# Patient Record
Sex: Female | Born: 1965 | Race: White | Hispanic: No | Smoking: Current every day smoker
Health system: Southern US, Community
[De-identification: ages and names within clinical notes are randomized; demographics above are authoritative.]

## PROBLEM LIST (undated history)

## (undated) VITALS — BP 120/79 | HR 95 | Temp 98.0°F

## (undated) VITALS — BP 126/83 | HR 83 | Temp 98.6°F

## (undated) DIAGNOSIS — F32A Depression, unspecified: Secondary | ICD-10-CM

## (undated) DIAGNOSIS — G8929 Other chronic pain: Secondary | ICD-10-CM

## (undated) DIAGNOSIS — R002 Palpitations: Secondary | ICD-10-CM

## (undated) DIAGNOSIS — F329 Major depressive disorder, single episode, unspecified: Secondary | ICD-10-CM

## (undated) DIAGNOSIS — C801 Malignant (primary) neoplasm, unspecified: Secondary | ICD-10-CM

## (undated) DIAGNOSIS — J45909 Unspecified asthma, uncomplicated: Secondary | ICD-10-CM

## (undated) DIAGNOSIS — C4490 Unspecified malignant neoplasm of skin, unspecified: Secondary | ICD-10-CM

## (undated) DIAGNOSIS — R5382 Chronic fatigue, unspecified: Secondary | ICD-10-CM

## (undated) DIAGNOSIS — W3400XA Accidental discharge from unspecified firearms or gun, initial encounter: Secondary | ICD-10-CM

## (undated) DIAGNOSIS — B379 Candidiasis, unspecified: Secondary | ICD-10-CM

## (undated) DIAGNOSIS — F1910 Other psychoactive substance abuse, uncomplicated: Secondary | ICD-10-CM

## (undated) DIAGNOSIS — F419 Anxiety disorder, unspecified: Secondary | ICD-10-CM

## (undated) DIAGNOSIS — M549 Dorsalgia, unspecified: Secondary | ICD-10-CM

## (undated) DIAGNOSIS — F22 Delusional disorders: Secondary | ICD-10-CM

## (undated) DIAGNOSIS — G9332 Myalgic encephalomyelitis/chronic fatigue syndrome: Secondary | ICD-10-CM

## (undated) DIAGNOSIS — E039 Hypothyroidism, unspecified: Secondary | ICD-10-CM

## (undated) DIAGNOSIS — J189 Pneumonia, unspecified organism: Secondary | ICD-10-CM

## (undated) DIAGNOSIS — F319 Bipolar disorder, unspecified: Secondary | ICD-10-CM

## (undated) DIAGNOSIS — F259 Schizoaffective disorder, unspecified: Secondary | ICD-10-CM

## (undated) DIAGNOSIS — M1990 Unspecified osteoarthritis, unspecified site: Secondary | ICD-10-CM

## (undated) DIAGNOSIS — K838 Other specified diseases of biliary tract: Secondary | ICD-10-CM

## (undated) DIAGNOSIS — M797 Fibromyalgia: Secondary | ICD-10-CM

## (undated) DIAGNOSIS — J449 Chronic obstructive pulmonary disease, unspecified: Secondary | ICD-10-CM

## (undated) DIAGNOSIS — Z87442 Personal history of urinary calculi: Secondary | ICD-10-CM

## (undated) DIAGNOSIS — N898 Other specified noninflammatory disorders of vagina: Principal | ICD-10-CM

## (undated) DIAGNOSIS — K219 Gastro-esophageal reflux disease without esophagitis: Secondary | ICD-10-CM

## (undated) HISTORY — DX: Other psychoactive substance abuse, uncomplicated: F19.10

## (undated) HISTORY — DX: Accidental discharge from unspecified firearms or gun, initial encounter: W34.00XA

## (undated) HISTORY — DX: Dorsalgia, unspecified: M54.9

## (undated) HISTORY — DX: Anxiety disorder, unspecified: F41.9

## (undated) HISTORY — DX: Fibromyalgia: M79.7

## (undated) HISTORY — DX: Major depressive disorder, single episode, unspecified: F32.9

## (undated) HISTORY — DX: Palpitations: R00.2

## (undated) HISTORY — DX: Unspecified osteoarthritis, unspecified site: M19.90

## (undated) HISTORY — DX: Unspecified malignant neoplasm of skin, unspecified: C44.90

## (undated) HISTORY — DX: Other chronic pain: G89.29

## (undated) HISTORY — DX: Gastro-esophageal reflux disease without esophagitis: K21.9

## (undated) HISTORY — DX: Candidiasis, unspecified: B37.9

## (undated) HISTORY — DX: Depression, unspecified: F32.A

## (undated) HISTORY — DX: Chronic fatigue, unspecified: R53.82

## (undated) HISTORY — DX: Other specified noninflammatory disorders of vagina: N89.8

## (undated) HISTORY — DX: Myalgic encephalomyelitis/chronic fatigue syndrome: G93.32

---

## 2000-04-04 DIAGNOSIS — F1910 Other psychoactive substance abuse, uncomplicated: Secondary | ICD-10-CM

## 2000-04-04 HISTORY — DX: Other psychoactive substance abuse, uncomplicated: F19.10

## 2000-11-05 ENCOUNTER — Emergency Department (HOSPITAL_COMMUNITY): Admission: EM | Admit: 2000-11-05 | Discharge: 2000-11-05 | Payer: Self-pay | Admitting: *Deleted

## 2003-11-29 ENCOUNTER — Emergency Department (HOSPITAL_COMMUNITY): Admission: EM | Admit: 2003-11-29 | Discharge: 2003-11-30 | Payer: Self-pay | Admitting: Emergency Medicine

## 2004-03-19 ENCOUNTER — Emergency Department (HOSPITAL_COMMUNITY): Admission: EM | Admit: 2004-03-19 | Discharge: 2004-03-19 | Payer: Self-pay | Admitting: Emergency Medicine

## 2004-06-11 ENCOUNTER — Emergency Department (HOSPITAL_COMMUNITY): Admission: EM | Admit: 2004-06-11 | Discharge: 2004-06-12 | Payer: Self-pay | Admitting: *Deleted

## 2006-03-14 ENCOUNTER — Emergency Department (HOSPITAL_COMMUNITY): Admission: EM | Admit: 2006-03-14 | Discharge: 2006-03-14 | Payer: Self-pay | Admitting: Emergency Medicine

## 2006-03-15 ENCOUNTER — Ambulatory Visit (HOSPITAL_COMMUNITY): Admission: RE | Admit: 2006-03-15 | Discharge: 2006-03-15 | Payer: Self-pay | Admitting: Emergency Medicine

## 2006-04-04 HISTORY — PX: ABDOMINAL HYSTERECTOMY: SHX81

## 2006-05-15 ENCOUNTER — Emergency Department (HOSPITAL_COMMUNITY): Admission: EM | Admit: 2006-05-15 | Discharge: 2006-05-15 | Payer: Self-pay | Admitting: Emergency Medicine

## 2006-05-18 ENCOUNTER — Emergency Department (HOSPITAL_COMMUNITY): Admission: EM | Admit: 2006-05-18 | Discharge: 2006-05-18 | Payer: Self-pay | Admitting: Emergency Medicine

## 2006-06-19 ENCOUNTER — Emergency Department (HOSPITAL_COMMUNITY): Admission: EM | Admit: 2006-06-19 | Discharge: 2006-06-20 | Payer: Self-pay | Admitting: Emergency Medicine

## 2006-06-20 ENCOUNTER — Ambulatory Visit: Payer: Self-pay | Admitting: Psychiatry

## 2006-06-20 ENCOUNTER — Emergency Department (HOSPITAL_COMMUNITY): Admission: EM | Admit: 2006-06-20 | Discharge: 2006-06-20 | Payer: Self-pay | Admitting: Emergency Medicine

## 2006-06-20 ENCOUNTER — Inpatient Hospital Stay (HOSPITAL_COMMUNITY): Admission: RE | Admit: 2006-06-20 | Discharge: 2006-06-26 | Payer: Self-pay | Admitting: Psychiatry

## 2006-07-22 ENCOUNTER — Inpatient Hospital Stay (HOSPITAL_COMMUNITY): Admission: EM | Admit: 2006-07-22 | Discharge: 2006-07-24 | Payer: Self-pay | Admitting: Psychiatry

## 2006-07-22 ENCOUNTER — Emergency Department (HOSPITAL_COMMUNITY): Admission: EM | Admit: 2006-07-22 | Discharge: 2006-07-22 | Payer: Self-pay | Admitting: Emergency Medicine

## 2006-07-24 ENCOUNTER — Ambulatory Visit: Payer: Self-pay | Admitting: Psychiatry

## 2006-09-07 ENCOUNTER — Emergency Department (HOSPITAL_COMMUNITY): Admission: EM | Admit: 2006-09-07 | Discharge: 2006-09-08 | Payer: Self-pay | Admitting: *Deleted

## 2007-04-20 ENCOUNTER — Emergency Department (HOSPITAL_COMMUNITY): Admission: EM | Admit: 2007-04-20 | Discharge: 2007-04-21 | Payer: Self-pay | Admitting: Emergency Medicine

## 2007-04-27 ENCOUNTER — Emergency Department (HOSPITAL_COMMUNITY): Admission: EM | Admit: 2007-04-27 | Discharge: 2007-04-28 | Payer: Self-pay | Admitting: Emergency Medicine

## 2007-05-23 ENCOUNTER — Emergency Department (HOSPITAL_COMMUNITY): Admission: EM | Admit: 2007-05-23 | Discharge: 2007-05-23 | Payer: Self-pay | Admitting: Emergency Medicine

## 2009-03-23 ENCOUNTER — Emergency Department (HOSPITAL_COMMUNITY): Admission: EM | Admit: 2009-03-23 | Discharge: 2009-03-23 | Payer: Self-pay | Admitting: Emergency Medicine

## 2009-06-24 ENCOUNTER — Emergency Department (HOSPITAL_COMMUNITY): Admission: EM | Admit: 2009-06-24 | Discharge: 2009-06-24 | Payer: Self-pay | Admitting: Emergency Medicine

## 2009-09-23 ENCOUNTER — Emergency Department (HOSPITAL_COMMUNITY): Admission: EM | Admit: 2009-09-23 | Discharge: 2009-09-23 | Payer: Self-pay | Admitting: Emergency Medicine

## 2010-02-04 ENCOUNTER — Emergency Department (HOSPITAL_COMMUNITY): Admission: EM | Admit: 2010-02-04 | Discharge: 2010-02-04 | Payer: Self-pay | Admitting: Emergency Medicine

## 2010-04-16 ENCOUNTER — Ambulatory Visit: Admit: 2010-04-16 | Payer: Self-pay | Admitting: Cardiology

## 2010-04-16 ENCOUNTER — Encounter (INDEPENDENT_AMBULATORY_CARE_PROVIDER_SITE_OTHER): Payer: Self-pay | Admitting: *Deleted

## 2010-04-25 ENCOUNTER — Encounter: Payer: Self-pay | Admitting: Emergency Medicine

## 2010-05-06 NOTE — Letter (Signed)
Summary: Appointment - Missed  [facility] [place]  [address], [place] [address]   Phone: [phone]  Fax: [phone]     April 16, 2010 MRN: [id number]   [facility] [address] [place], [place]  [address]   Dear Ms. Liddy,  Our records indicate you missed your appointment on        04/16/10                with Dr.       .        MCDOWELL                               It is very important that we reach you to reschedule this appointment. We look forward to participating in your health care needs. Please contact us at the number listed above at your earliest convenience to reschedule this appointment.     Sincerely,    Glass blower/designer

## 2010-06-09 ENCOUNTER — Emergency Department (HOSPITAL_COMMUNITY)
Admission: EM | Admit: 2010-06-09 | Discharge: 2010-06-09 | Disposition: A | Discharge status: Home / Self Care | Payer: Medicaid Other | Attending: Emergency Medicine | Admitting: Emergency Medicine

## 2010-06-09 DIAGNOSIS — J020 Streptococcal pharyngitis: Secondary | ICD-10-CM | POA: Insufficient documentation

## 2010-06-09 LAB — RAPID STREP SCREEN (MED CTR MEBANE ONLY): Streptococcus, Group A Screen (Direct): POSITIVE — AB

## 2010-06-15 LAB — CBC
MCH: 32.8 pg (ref 26.0–34.0)
MCV: 98.4 fL (ref 78.0–100.0)
Platelets: 268 10*3/uL (ref 150–400)
RBC: 4.57 MIL/uL (ref 3.87–5.11)
RDW: 14.5 % (ref 11.5–15.5)

## 2010-06-15 LAB — DIFFERENTIAL
Basophils Absolute: 0 10*3/uL (ref 0.0–0.1)
Basophils Relative: 0 % (ref 0–1)
Eosinophils Absolute: 0.1 10*3/uL (ref 0.0–0.7)
Eosinophils Relative: 2 % (ref 0–5)
Lymphs Abs: 3 10*3/uL (ref 0.7–4.0)
Neutrophils Relative %: 60 % (ref 43–77)

## 2010-06-15 LAB — POCT CARDIAC MARKERS: Myoglobin, poc: 44.2 ng/mL (ref 12–200)

## 2010-06-15 LAB — BASIC METABOLIC PANEL
BUN: 17 mg/dL (ref 6–23)
CO2: 23 mEq/L (ref 19–32)
Calcium: 9.3 mg/dL (ref 8.4–10.5)
Creatinine, Ser: 0.95 mg/dL (ref 0.4–1.2)
GFR calc Af Amer: >60 mL/min (ref >60)

## 2010-06-20 LAB — POCT CARDIAC MARKERS
CKMB, poc: <1 ng/mL — ABNORMAL LOW (ref 1.0–8.0)
Troponin i, poc: <0.05 ng/mL (ref 0.00–0.09)

## 2010-06-20 LAB — URINALYSIS, ROUTINE W REFLEX MICROSCOPIC
Bilirubin Urine: NEGATIVE
Glucose, UA: NEGATIVE mg/dL
Ketones, ur: NEGATIVE mg/dL
Protein, ur: NEGATIVE mg/dL
pH: 6.5 (ref 5.0–8.0)

## 2010-06-20 LAB — BASIC METABOLIC PANEL
BUN: 17 mg/dL (ref 6–23)
Chloride: 108 mEq/L (ref 96–112)
GFR calc Af Amer: >60 mL/min (ref >60)
GFR calc non Af Amer: >60 mL/min (ref >60)
Potassium: 3.7 mEq/L (ref 3.5–5.1)
Sodium: 138 mEq/L (ref 135–145)

## 2010-06-20 LAB — CBC
HCT: 46 % (ref 36.0–46.0)
MCV: 97.7 fL (ref 78.0–100.0)
RBC: 4.71 MIL/uL (ref 3.87–5.11)
RDW: 15.5 % (ref 11.5–15.5)
WBC: 6.3 10*3/uL (ref 4.0–10.5)

## 2010-06-20 LAB — URINE MICROSCOPIC-ADD ON

## 2010-06-20 LAB — DIFFERENTIAL
Basophils Absolute: 0 10*3/uL (ref 0.0–0.1)
Eosinophils Relative: 1 % (ref 0–5)
Lymphocytes Relative: 25 % (ref 12–46)
Lymphs Abs: 1.6 10*3/uL (ref 0.7–4.0)
Monocytes Absolute: 0.5 10*3/uL (ref 0.1–1.0)
Neutro Abs: 4.1 10*3/uL (ref 1.7–7.7)

## 2010-07-05 LAB — HEPATIC FUNCTION PANEL
ALT: 12 U/L (ref 0–35)
AST: 20 U/L (ref 0–37)
Albumin: 4.4 g/dL (ref 3.5–5.2)
Indirect Bilirubin: 0.4 mg/dL (ref 0.3–0.9)
Total Protein: 7.6 g/dL (ref 6.0–8.3)

## 2010-07-05 LAB — DIFFERENTIAL
Basophils Absolute: 0 10*3/uL (ref 0.0–0.1)
Eosinophils Relative: 1 % (ref 0–5)
Lymphocytes Relative: 19 % (ref 12–46)
Neutro Abs: 7.6 10*3/uL (ref 1.7–7.7)
Neutrophils Relative %: 74 % (ref 43–77)

## 2010-07-05 LAB — RAPID URINE DRUG SCREEN, HOSP PERFORMED
Amphetamines: NOT DETECTED
Benzodiazepines: NOT DETECTED
Cocaine: NOT DETECTED
Opiates: NOT DETECTED
Tetrahydrocannabinol: NOT DETECTED

## 2010-07-05 LAB — CBC
Platelets: 364 10*3/uL (ref 150–400)
RDW: 14.8 % (ref 11.5–15.5)
WBC: 10.3 10*3/uL (ref 4.0–10.5)

## 2010-07-05 LAB — URINALYSIS, ROUTINE W REFLEX MICROSCOPIC
Bilirubin Urine: NEGATIVE
Ketones, ur: 15 mg/dL — AB
Nitrite: NEGATIVE
Urobilinogen, UA: 0.2 mg/dL (ref 0.0–1.0)

## 2010-07-05 LAB — BASIC METABOLIC PANEL
BUN: 9 mg/dL (ref 6–23)
Calcium: 9.4 mg/dL (ref 8.4–10.5)
Creatinine, Ser: 0.65 mg/dL (ref 0.4–1.2)
GFR calc non Af Amer: >60 mL/min (ref >60)
Glucose, Bld: 108 mg/dL — ABNORMAL HIGH (ref 70–99)

## 2010-07-13 ENCOUNTER — Emergency Department (HOSPITAL_COMMUNITY)
Admission: EM | Admit: 2010-07-13 | Discharge: 2010-07-13 | Disposition: A | Discharge status: Home / Self Care | Payer: Medicaid Other | Attending: Emergency Medicine | Admitting: Emergency Medicine

## 2010-07-13 DIAGNOSIS — M129 Arthropathy, unspecified: Secondary | ICD-10-CM | POA: Insufficient documentation

## 2010-07-13 DIAGNOSIS — M545 Low back pain, unspecified: Secondary | ICD-10-CM | POA: Insufficient documentation

## 2010-07-13 DIAGNOSIS — M5430 Sciatica, unspecified side: Secondary | ICD-10-CM | POA: Insufficient documentation

## 2010-07-21 ENCOUNTER — Other Ambulatory Visit (HOSPITAL_COMMUNITY): Payer: Self-pay | Admitting: Family Medicine

## 2010-07-21 ENCOUNTER — Ambulatory Visit (HOSPITAL_COMMUNITY)
Admission: RE | Admit: 2010-07-21 | Discharge: 2010-07-21 | Disposition: A | Discharge status: Home / Self Care | Payer: Medicaid Other | Source: Ambulatory Visit | Attending: Family Medicine | Admitting: Family Medicine

## 2010-07-21 DIAGNOSIS — M549 Dorsalgia, unspecified: Secondary | ICD-10-CM

## 2010-07-21 DIAGNOSIS — M51379 Other intervertebral disc degeneration, lumbosacral region without mention of lumbar back pain or lower extremity pain: Secondary | ICD-10-CM | POA: Insufficient documentation

## 2010-07-21 DIAGNOSIS — M545 Low back pain, unspecified: Secondary | ICD-10-CM | POA: Insufficient documentation

## 2010-07-21 DIAGNOSIS — M5137 Other intervertebral disc degeneration, lumbosacral region: Secondary | ICD-10-CM | POA: Insufficient documentation
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
## 2010-09-30 ENCOUNTER — Encounter: Payer: Self-pay | Admitting: Cardiology

## 2010-09-30 ENCOUNTER — Ambulatory Visit (INDEPENDENT_AMBULATORY_CARE_PROVIDER_SITE_OTHER): Payer: Medicaid Other | Admitting: Cardiology

## 2010-09-30 DIAGNOSIS — F17200 Nicotine dependence, unspecified, uncomplicated: Secondary | ICD-10-CM | POA: Insufficient documentation

## 2010-09-30 DIAGNOSIS — Z720 Tobacco use: Secondary | ICD-10-CM | POA: Insufficient documentation

## 2010-09-30 DIAGNOSIS — R002 Palpitations: Secondary | ICD-10-CM | POA: Insufficient documentation

## 2010-09-30 DIAGNOSIS — R0602 Shortness of breath: Secondary | ICD-10-CM

## 2010-09-30 DIAGNOSIS — R030 Elevated blood-pressure reading, without diagnosis of hypertension: Secondary | ICD-10-CM

## 2010-09-30 NOTE — Assessment & Plan Note (Signed)
Complete smoking cessation is recommended. We discussed this today.
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
## 2010-09-30 NOTE — Assessment & Plan Note (Signed)
Present for several months as outlined. I have recommended complete smoking cessation, diet and exercise. Principal cardiac risk factors include ongoing tobacco use, family history of premature cardiovascular disease. Blood pressure elevated today, although without standing diagnosis of hypertension. She is very concerned about the potential for obstructive CAD, mainly related to her father's history. Although suspicion is low that her shortness of breath is a reflection of underlying obstructive ischemic heart disease, an exercise echocardiogram will be obtained for objective assessment and further reassurance. If this is reassuring, would focus on general risk factor modification strategies.

## 2010-09-30 NOTE — Patient Instructions (Signed)
Your physician has requested that you have en exercise stress myoview. For further information please visit [email or web address] Please follow instruction sheet, as given.  Your physician has recommended that you wear an event monitor. Event monitors are medical devices that record the heart's electrical activity. Doctors most often [place] these monitors to diagnose arrhythmias. Arrhythmias are problems with the speed or rhythm of the heartbeat. The monitor is a small, portable device. You can wear one while you do your normal daily activities. This is usually used to diagnose what is causing palpitations/syncope (passing out).  Your physician recommends that you continue on your current medications as directed. Please refer to the Current Medication list given to you today.  Your physician recommends that you schedule a follow-up appointment in: as needed, we will contact you with results of stress test

## 2010-09-30 NOTE — Assessment & Plan Note (Signed)
Continue observation with primary care physician. Diet and exercise discussed.

## 2010-09-30 NOTE — Assessment & Plan Note (Signed)
Intermittent and fairly long-standing as outlined above, not associated with syncope. No clear precipitant is described. Resting ECG is normal with normal intervals. Have recommended caffeine restriction, regular walking regimen. A 7 day cardiac monitor will be provided to investigate this more objectively in terms of any potential rhythm change that might require further evaluation.. We will inform her of the results.

## 2010-10-07 ENCOUNTER — Telehealth: Payer: Self-pay | Admitting: Cardiology

## 2010-10-07 NOTE — Telephone Encounter (Signed)
Patient states she has received Cardionet monitor but only has cell phone / states that she can not transmit readings with cell phone / wants to know what she is to do / tg

## 2010-10-08 ENCOUNTER — Encounter: Payer: Self-pay | Admitting: Cardiology

## 2010-10-11 ENCOUNTER — Ambulatory Visit (HOSPITAL_COMMUNITY)
Admission: RE | Admit: 2010-10-11 | Discharge: 2010-10-11 | Disposition: A | Discharge status: Home / Self Care | Payer: Medicaid Other | Source: Ambulatory Visit | Attending: Cardiology | Admitting: Cardiology

## 2010-10-11 ENCOUNTER — Encounter (HOSPITAL_COMMUNITY): Payer: Self-pay | Admitting: Cardiology

## 2010-10-11 ENCOUNTER — Inpatient Hospital Stay (HOSPITAL_COMMUNITY): Admission: RE | Admit: 2010-10-11 | Payer: Medicaid Other | Source: Ambulatory Visit

## 2010-10-11 ENCOUNTER — Encounter (HOSPITAL_COMMUNITY): Payer: Self-pay | Admitting: *Deleted

## 2010-10-11 DIAGNOSIS — R079 Chest pain, unspecified: Secondary | ICD-10-CM
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
## 2010-10-12 ENCOUNTER — Encounter: Payer: Self-pay | Admitting: Cardiology

## 2010-10-12 NOTE — Procedures (Signed)
[name], [name]                ACCOUNT NO.:  [id number]  MEDICAL RECORD NO.:  [id number]  LOCATION:  CARDIOPU                      FACILITY:  [facility]  PHYSICIAN:  Jonelle Sidle, MD DATE OF BIRTH:  Sep 24, 1965  DATE OF PROCEDURE:  10/11/2010 DATE OF DISCHARGE:                                 STRESS TEST   PRIMARY CARE PHYSICIAN:  Dr. Maudry Diego.  CARDIOLOGIST:  Jonelle Sidle, MD  INDICATIONS:  Dyspnea on exertion and palpitations.  RESTING DATA:  Blood pressure 112/60, heart rate 87, 12-lead electrocardiogram shows normal sinus rhythm at 86 beats per minute.  STRESS DATA:  The patient was exercised on a standard Bruce protocol for 8 minutes and 38 seconds achieving maximum workload of 10.1 mets.  Peak heart rate was 151 beats per minute which was 86% of the maximum age predicted heart rate response.  Peak blood pressure was 162/62.  The patient did report shortness of breath and some general chest discomfort during exercise, however, there were no clearly diagnostic ST-segment changes noted in the setting of lead motion artifact.  No significant arrhythmias were noted.  A 2-D echocardiographic findings, images were obtained at rest and immediately following peak exertion using the parasternal long-axis, parasternal short axis, apical-two chamber, and apical four-chamber views.  Left ventricular systolic function is normal at rest without focal wall motion abnormality, and with an estimated left ventricular ejection fraction of 55-60%.  Peak images demonstrate appropriate hyperdynamic function with no inducible wall motion abnormalities and decrease in chamber size, LVEF greater than 75%.  CONCLUSION: 1. Negative exercise treadmill test for ischemia by standard criteria     at a maximum workload of 10.1 mets.  General chest discomfort and     dyspnea on exertion were described, however, without obvious ST-     segment     abnormalities or cardiac dysrhythmia.   Mildly hypertensive response     to exercise was noted. 2. No inducible wall motion abnormalities to indicate ischemia with     appropriate hyperdynamic function and decrease in chamber size with     stress.     Jonelle Sidle, MD     SGM/MEDQ  D:  10/11/2010  T:  10/11/2010  Job:  [id number]  cc:   Jonelle Sidle, MD [address] [place], [place] [address]  Dr. Maudry Diego

## 2010-10-12 NOTE — Progress Notes (Signed)
  Stress Lab Nurses Notes - Zorana Brockwell 10/11/2010  Reason for doing test: Arrhythmia/Palpitations and dyspnea  Type of test: Stress Echo  Nurse performing test: Parke Poisson, RN  Nuclear Medicine Tech: Not Applicable  Echo Tech: Karrie Doffing  MD performing test: Ival Bible  Family MD: Dr. [name]  Test explained and consent signed: yes  IV started: No IV started  Symptoms: chest pressure and SOB  Treatment/Intervention: None  Reason test stopped: fatigue  After recovery IV was: NA  Patient to return to Nuc. Med at   NA  Patient discharged: Home  Patient's Condition upon discharge was: stable  Comments: Symptoms resolved in recovery.  Erskine Speed T

## 2010-10-15 ENCOUNTER — Telehealth: Payer: Self-pay

## 2010-12-15 ENCOUNTER — Telehealth: Payer: Self-pay | Admitting: Cardiology

## 2010-12-15 DIAGNOSIS — R002 Palpitations: Secondary | ICD-10-CM

## 2010-12-15 NOTE — Telephone Encounter (Signed)
Patient states that she spoke with regarding "a heart monitor" to wear for 2 days / there is nothing noted in EPIC about this / can you please check in to this / tg

## 2010-12-17 NOTE — Telephone Encounter (Signed)
**Note De-Identified  Obfuscation** On pt's last OV with Dr. Diona Browner on 09-30-10 pt. was advised to wear a 7 day event recorded for palpitations. Lifewatch was ordered but b/c pt. only has cell phone it did not work, we then ordered [employer]. Pt. wore the Cardionet monitor but the readings were poor due to pt. only having cell phone. Pt. states that someone from office told her we would order a 2 day Holter monitor but this is not documented in pt's chart. Please advise./LV

## 2010-12-17 NOTE — Telephone Encounter (Signed)
It has been explained to me that the patient is not able to get interpretable tracings with either Lifewatch or Cardionet monitors related to cell phone use and coverage as well as no available land line. There are no strips to review therefore. Please arrange a 48 hour Holter monitor.

## 2010-12-23 LAB — DIFFERENTIAL
Basophils Absolute: 0
Lymphocytes Relative: 22
Neutro Abs: 7.8 — ABNORMAL HIGH

## 2010-12-23 LAB — URINALYSIS, ROUTINE W REFLEX MICROSCOPIC
Bilirubin Urine: NEGATIVE
Ketones, ur: NEGATIVE
Nitrite: NEGATIVE
Urobilinogen, UA: 0.2
pH: 5.5

## 2010-12-23 LAB — POTASSIUM: Potassium: 3.1 — ABNORMAL LOW

## 2010-12-23 LAB — SALICYLATE LEVEL: Salicylate Lvl: <4

## 2010-12-23 LAB — CBC
Hemoglobin: 16 — ABNORMAL HIGH
Platelets: 287
RDW: 14.3

## 2010-12-23 LAB — I-STAT 8, (EC8 V) (CONVERTED LAB)
Bicarbonate: 24.5 — ABNORMAL HIGH
Hemoglobin: 16 — ABNORMAL HIGH
Operator id: 282261
Sodium: 138
TCO2: 26

## 2010-12-23 LAB — RAPID URINE DRUG SCREEN, HOSP PERFORMED
Cocaine: NOT DETECTED
Tetrahydrocannabinol: NOT DETECTED

## 2010-12-23 LAB — BASIC METABOLIC PANEL
Calcium: 9.1
GFR calc non Af Amer: >60
Glucose, Bld: 102 — ABNORMAL HIGH
Sodium: 138

## 2010-12-23 LAB — ACETAMINOPHEN LEVEL: Acetaminophen (Tylenol), Serum: <10 — ABNORMAL LOW

## 2010-12-27 ENCOUNTER — Emergency Department (HOSPITAL_COMMUNITY): Payer: Medicaid Other

## 2010-12-27 ENCOUNTER — Encounter (HOSPITAL_COMMUNITY): Payer: Self-pay | Admitting: *Deleted

## 2010-12-27 ENCOUNTER — Emergency Department (HOSPITAL_COMMUNITY)
Admission: EM | Admit: 2010-12-27 | Discharge: 2010-12-27 | Disposition: A | Discharge status: Home / Self Care | Payer: Medicaid Other | Attending: Emergency Medicine | Admitting: Emergency Medicine

## 2010-12-27 ENCOUNTER — Other Ambulatory Visit: Payer: Self-pay

## 2010-12-27 ENCOUNTER — Encounter
Payer: Medicaid Other | Attending: Physical Medicine and Rehabilitation | Admitting: Physical Medicine and Rehabilitation

## 2010-12-27 ENCOUNTER — Emergency Department (HOSPITAL_COMMUNITY)
Admission: EM | Admit: 2010-12-27 | Discharge: 2010-12-27 | Disposition: A | Discharge status: Home / Self Care | Payer: Medicaid Other | Source: Home / Self Care | Attending: Emergency Medicine | Admitting: Emergency Medicine

## 2010-12-27 DIAGNOSIS — M51379 Other intervertebral disc degeneration, lumbosacral region without mention of lumbar back pain or lower extremity pain: Secondary | ICD-10-CM | POA: Insufficient documentation

## 2010-12-27 DIAGNOSIS — F319 Bipolar disorder, unspecified: Secondary | ICD-10-CM | POA: Insufficient documentation

## 2010-12-27 DIAGNOSIS — E876 Hypokalemia: Secondary | ICD-10-CM | POA: Insufficient documentation

## 2010-12-27 DIAGNOSIS — J069 Acute upper respiratory infection, unspecified: Secondary | ICD-10-CM

## 2010-12-27 DIAGNOSIS — F17200 Nicotine dependence, unspecified, uncomplicated: Secondary | ICD-10-CM | POA: Insufficient documentation

## 2010-12-27 DIAGNOSIS — F411 Generalized anxiety disorder: Secondary | ICD-10-CM | POA: Insufficient documentation

## 2010-12-27 DIAGNOSIS — E860 Dehydration: Secondary | ICD-10-CM

## 2010-12-27 DIAGNOSIS — M5137 Other intervertebral disc degeneration, lumbosacral region: Secondary | ICD-10-CM | POA: Insufficient documentation

## 2010-12-27 DIAGNOSIS — R079 Chest pain, unspecified: Secondary | ICD-10-CM | POA: Insufficient documentation

## 2010-12-27 DIAGNOSIS — F1910 Other psychoactive substance abuse, uncomplicated: Secondary | ICD-10-CM | POA: Insufficient documentation

## 2010-12-27 DIAGNOSIS — N72 Inflammatory disease of cervix uteri: Secondary | ICD-10-CM

## 2010-12-27 DIAGNOSIS — Z87828 Personal history of other (healed) physical injury and trauma: Secondary | ICD-10-CM | POA: Insufficient documentation

## 2010-12-27 DIAGNOSIS — K219 Gastro-esophageal reflux disease without esophagitis: Secondary | ICD-10-CM | POA: Insufficient documentation

## 2010-12-27 DIAGNOSIS — IMO0001 Reserved for inherently not codable concepts without codable children: Secondary | ICD-10-CM | POA: Insufficient documentation

## 2010-12-27 DIAGNOSIS — J40 Bronchitis, not specified as acute or chronic: Secondary | ICD-10-CM | POA: Insufficient documentation

## 2010-12-27 DIAGNOSIS — I498 Other specified cardiac arrhythmias: Secondary | ICD-10-CM | POA: Insufficient documentation

## 2010-12-27 DIAGNOSIS — R05 Cough: Secondary | ICD-10-CM | POA: Insufficient documentation

## 2010-12-27 DIAGNOSIS — R059 Cough, unspecified: Secondary | ICD-10-CM | POA: Insufficient documentation

## 2010-12-27 LAB — POCT I-STAT, CHEM 8
Calcium, Ion: 1.12 mmol/L (ref 1.12–1.32)
Chloride: 105 mEq/L (ref 96–112)
Creatinine, Ser: 0.7 mg/dL (ref 0.50–1.10)
Glucose, Bld: 114 mg/dL — ABNORMAL HIGH (ref 70–99)
HCT: 52 % — ABNORMAL HIGH (ref 36.0–46.0)
Potassium: 3.3 mEq/L — ABNORMAL LOW (ref 3.5–5.1)

## 2010-12-27 MED ORDER — AZITHROMYCIN 250 MG PO TABS
1000.0000 mg | ORAL_TABLET | Freq: Once | ORAL | Status: AC
  Administered 2010-12-27: 1000 mg via ORAL
  Filled 2010-12-27: qty 4

## 2010-12-27 MED ORDER — DOXYCYCLINE HYCLATE 100 MG PO CAPS: 100.0000 mg | ORAL_CAPSULE | Freq: Two times a day (BID) | ORAL | Status: AC

## 2010-12-27 MED ORDER — CEFTRIAXONE SODIUM 250 MG IJ SOLR
250.0000 mg | Freq: Once | INTRAMUSCULAR | Status: AC
  Administered 2010-12-27: 250 mg via INTRAMUSCULAR
  Filled 2010-12-27: qty 250

## 2010-12-27 MED ORDER — LIDOCAINE HCL (PF) 1 % IJ SOLN
2.0000 mL | Freq: Once | INTRAMUSCULAR | Status: AC
  Administered 2010-12-27: 2 mL via INTRADERMAL
  Filled 2010-12-27: qty 5

## 2010-12-27 MED ORDER — POTASSIUM CHLORIDE CRYS ER 20 MEQ PO TBCR
40.0000 meq | EXTENDED_RELEASE_TABLET | Freq: Once | ORAL | Status: AC
  Administered 2010-12-27: 40 meq via ORAL
  Filled 2010-12-27: qty 2

## 2010-12-27 MED ORDER — POTASSIUM CHLORIDE ER 10 MEQ PO TBCR: EXTENDED_RELEASE_TABLET | ORAL | Status: DC

## 2010-12-27 NOTE — ED Notes (Addendum)
Pt c/o coughing up green phlegm for 3 days. Pt also c/o chills. Pt also c/o yellow vaginal discharge. States that she was treated for trichomonas at her MD office 1 1/2 weeks ago but still has the discharge.
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
## 2010-12-27 NOTE — ED Notes (Signed)
Pt c/o generalized aching all over. States that she feels like she is burning up inside. Pt seen in ED this am and told that she had a URI.
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
## 2010-12-28 LAB — GC/CHLAMYDIA PROBE AMP, GENITAL: Chlamydia, DNA Probe: NEGATIVE

## 2011-01-20 LAB — I-STAT 8, (EC8 V) (CONVERTED LAB)
BUN: 20
Chloride: 105
Glucose, Bld: 102 — ABNORMAL HIGH
HCT: 47 — ABNORMAL HIGH
Hemoglobin: 16 — ABNORMAL HIGH
Operator id: 272551
Sodium: 135

## 2011-01-20 LAB — POCT I-STAT CREATININE
Creatinine, Ser: 0.7
Operator id: 272551

## 2011-02-18 ENCOUNTER — Telehealth: Payer: Self-pay

## 2011-02-18 DIAGNOSIS — R002 Palpitations: Secondary | ICD-10-CM

## 2011-02-18 NOTE — Telephone Encounter (Signed)
**Note De-Identified  Obfuscation** Pt. c/o SOB, fatigue and weakness but denies palpitations and would like to wear holter monitor that was ordered for her in September per Dr. Diona Browner. On last OV with Dr. Diona Browner on 09-30-10 pt. was advised to wear an Event monitor for 7 days for palpitations. She wore both a [employer] event monitor and then a [employer]ion[employer] event monitor but b/c pt. only has a cell phone we had no strips to review so Dr. Diona Browner recommended a 48 hour holter monitor. Pt. was advised of recommendation and that we would contact her with time to come to [facility] to have holter monitor attached. We have not been able to reach her since then. She wants to know if she can get holter monitor now. Please advise./LV

## 2011-02-18 NOTE — Telephone Encounter (Signed)
Yes, go ahead and arrange Holter monitor as ordered.

## 2011-02-21 ENCOUNTER — Ambulatory Visit (HOSPITAL_COMMUNITY)
Admission: RE | Admit: 2011-02-21 | Discharge: 2011-02-21 | Disposition: A | Discharge status: Home / Self Care | Payer: Medicaid Other | Source: Ambulatory Visit | Attending: Cardiology | Admitting: Cardiology

## 2011-02-21 DIAGNOSIS — R002 Palpitations: Secondary | ICD-10-CM

## 2011-02-21 NOTE — Progress Notes (Signed)
*  PRELIMINARY RESULTS* Echocardiogram 48H Holter monitor has been performed.  Conrad  02/21/2011, 3:14 PM

## 2011-02-24 ENCOUNTER — Encounter (HOSPITAL_COMMUNITY): Payer: Self-pay

## 2011-02-24 ENCOUNTER — Emergency Department (HOSPITAL_COMMUNITY)
Admission: EM | Admit: 2011-02-24 | Discharge: 2011-02-24 | Disposition: A | Discharge status: Home / Self Care | Payer: Medicaid Other | Attending: Emergency Medicine | Admitting: Emergency Medicine

## 2011-02-24 DIAGNOSIS — R05 Cough: Secondary | ICD-10-CM

## 2011-02-24 DIAGNOSIS — F319 Bipolar disorder, unspecified: Secondary | ICD-10-CM | POA: Insufficient documentation

## 2011-02-24 DIAGNOSIS — R059 Cough, unspecified: Secondary | ICD-10-CM | POA: Insufficient documentation

## 2011-02-24 DIAGNOSIS — M51379 Other intervertebral disc degeneration, lumbosacral region without mention of lumbar back pain or lower extremity pain: Secondary | ICD-10-CM | POA: Insufficient documentation

## 2011-02-24 DIAGNOSIS — M5137 Other intervertebral disc degeneration, lumbosacral region: Secondary | ICD-10-CM | POA: Insufficient documentation

## 2011-02-24 DIAGNOSIS — F1910 Other psychoactive substance abuse, uncomplicated: Secondary | ICD-10-CM | POA: Insufficient documentation

## 2011-02-24 DIAGNOSIS — J069 Acute upper respiratory infection, unspecified: Secondary | ICD-10-CM | POA: Insufficient documentation

## 2011-02-24 DIAGNOSIS — K219 Gastro-esophageal reflux disease without esophagitis: Secondary | ICD-10-CM | POA: Insufficient documentation

## 2011-02-24 DIAGNOSIS — F17200 Nicotine dependence, unspecified, uncomplicated: Secondary | ICD-10-CM | POA: Insufficient documentation

## 2011-02-24 DIAGNOSIS — Z87828 Personal history of other (healed) physical injury and trauma: Secondary | ICD-10-CM | POA: Insufficient documentation

## 2011-02-24 HISTORY — DX: Bipolar disorder, unspecified: F31.9

## 2011-02-24 MED ORDER — HYDROCOD POLST-CHLORPHEN POLST 10-8 MG/5ML PO LQCR
5.0000 mL | Freq: Once | ORAL | Status: AC
  Administered 2011-02-24: 5 mL via ORAL
  Filled 2011-02-24: qty 5

## 2011-02-24 MED ORDER — PREDNISONE 10 MG PO TABS: ORAL_TABLET | ORAL | Status: DC

## 2011-02-24 NOTE — ED Notes (Signed)
Pt presents with cough and sore throat. Pt states she is becoming increasingly hoarse. Pt states she feels a lump in her throat. Pt in triage with large drink. Pt also states that she feels SOB but is speaking in full lengthy sentences in triage. Pt state she has been under treatment for same symptoms by PMD but is not getting any better.
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
## 2011-02-25 ENCOUNTER — Telehealth: Payer: Self-pay | Admitting: Cardiology

## 2011-02-25 NOTE — ED Provider Notes (Signed)
Evaluation and management procedures were performed by the PA/NP under my supervision/collaboration.   Jamal Haskin, MD 02/25/11 0040 

## 2011-02-25 NOTE — Telephone Encounter (Signed)
Patient dropped off Holter Monitor

## 2011-03-01 NOTE — Procedures (Signed)
NAME:  Meredith [name], Meredith [name]                     ACCOUNT NO.:  MEDICAL RECORD NO.:  [id number]  LOCATION:                                 FACILITY:  PHYSICIAN:  Gerrit Friends. Dietrich Pates, MD, FACCDATE OF BIRTH:  1966-03-07  DATE OF PROCEDURE:  02/23/2011 DATE OF DISCHARGE:                               HOLTER MONITOR   REFERRING PHYSICIAN:  Jonelle Sidle, MD.  CLINICAL DATA:  A 45 year old woman with palpitations. 1. Continuous electrocardiographic recording was maintained for 48     hours during which the predominant rhythm was normal sinus, with     modest sinus tachycardia reaching a peak of 129 bpm. 2. No ventricular ectopy occurred.  Occasional premature     supraventricular ectopics were identified, occurring at an average     rate of 6 per hour. 3. Modest ST segment depression was present at rest in 1 of 3 leads     with an increase in depression during periods with increased heart     rate. 4. No diary of symptoms was returned.  The event button was not     depressed.     Gerrit Friends. Dietrich Pates, MD, [facility]     RMR/MEDQ  D:  02/28/2011  T:  02/28/2011  Job:  [id number]

## 2011-03-14 ENCOUNTER — Telehealth: Payer: Self-pay | Admitting: Cardiology

## 2011-03-14 NOTE — Telephone Encounter (Signed)
48 HR HOLTER RESULTS FOR NOVEMBER

## 2011-03-15 ENCOUNTER — Emergency Department (HOSPITAL_COMMUNITY)
Admission: EM | Admit: 2011-03-15 | Discharge: 2011-03-15 | Disposition: A | Discharge status: Home / Self Care | Payer: Medicaid Other | Attending: Emergency Medicine | Admitting: Emergency Medicine

## 2011-03-15 ENCOUNTER — Encounter: Payer: Self-pay | Admitting: *Deleted

## 2011-03-15 ENCOUNTER — Encounter (HOSPITAL_COMMUNITY): Payer: Self-pay

## 2011-03-15 ENCOUNTER — Telehealth: Payer: Self-pay | Admitting: *Deleted

## 2011-03-15 ENCOUNTER — Emergency Department (HOSPITAL_COMMUNITY): Payer: Medicaid Other

## 2011-03-15 DIAGNOSIS — K219 Gastro-esophageal reflux disease without esophagitis: Secondary | ICD-10-CM | POA: Insufficient documentation

## 2011-03-15 DIAGNOSIS — J3489 Other specified disorders of nose and nasal sinuses: Secondary | ICD-10-CM | POA: Insufficient documentation

## 2011-03-15 DIAGNOSIS — F341 Dysthymic disorder: Secondary | ICD-10-CM | POA: Insufficient documentation

## 2011-03-15 DIAGNOSIS — R0989 Other specified symptoms and signs involving the circulatory and respiratory systems: Secondary | ICD-10-CM | POA: Insufficient documentation

## 2011-03-15 DIAGNOSIS — R059 Cough, unspecified: Secondary | ICD-10-CM | POA: Insufficient documentation

## 2011-03-15 DIAGNOSIS — J189 Pneumonia, unspecified organism: Secondary | ICD-10-CM | POA: Insufficient documentation

## 2011-03-15 DIAGNOSIS — IMO0001 Reserved for inherently not codable concepts without codable children: Secondary | ICD-10-CM | POA: Insufficient documentation

## 2011-03-15 DIAGNOSIS — Z79899 Other long term (current) drug therapy: Secondary | ICD-10-CM | POA: Insufficient documentation

## 2011-03-15 DIAGNOSIS — R070 Pain in throat: Secondary | ICD-10-CM | POA: Insufficient documentation

## 2011-03-15 DIAGNOSIS — R05 Cough: Secondary | ICD-10-CM | POA: Insufficient documentation

## 2011-03-15 DIAGNOSIS — M549 Dorsalgia, unspecified: Secondary | ICD-10-CM | POA: Insufficient documentation

## 2011-03-15 DIAGNOSIS — G8929 Other chronic pain: Secondary | ICD-10-CM | POA: Insufficient documentation

## 2011-03-15 DIAGNOSIS — F319 Bipolar disorder, unspecified: Secondary | ICD-10-CM | POA: Insufficient documentation

## 2011-03-15 DIAGNOSIS — R51 Headache: Secondary | ICD-10-CM | POA: Insufficient documentation

## 2011-03-15 MED ORDER — PREDNISONE (PAK) 10 MG PO TABS: 10.0000 mg | ORAL_TABLET | Freq: Every day | ORAL | Status: AC

## 2011-03-15 MED ORDER — DOXYCYCLINE HYCLATE 100 MG PO CAPS: 100.0000 mg | ORAL_CAPSULE | Freq: Two times a day (BID) | ORAL | Status: AC

## 2011-03-15 MED ORDER — AZITHROMYCIN 250 MG PO TABS
500.0000 mg | ORAL_TABLET | Freq: Once | ORAL | Status: AC
  Administered 2011-03-15: 500 mg via ORAL
  Filled 2011-03-15: qty 2

## 2011-03-15 MED ORDER — PROMETHAZINE-CODEINE 6.25-10 MG/5ML PO SYRP: 5.0000 mL | ORAL_SOLUTION | ORAL | Status: AC | PRN

## 2011-03-15 MED ORDER — ALBUTEROL SULFATE HFA 108 (90 BASE) MCG/ACT IN AERS
2.0000 | INHALATION_SPRAY | RESPIRATORY_TRACT | Status: DC
  Administered 2011-03-15: 2 via RESPIRATORY_TRACT
  Filled 2011-03-15: qty 6.7

## 2011-03-15 MED ORDER — ONDANSETRON HCL 4 MG PO TABS
4.0000 mg | ORAL_TABLET | Freq: Once | ORAL | Status: AC
  Administered 2011-03-15: 4 mg via ORAL
  Filled 2011-03-15: qty 1

## 2011-03-15 MED ORDER — PREDNISONE 20 MG PO TABS
60.0000 mg | ORAL_TABLET | Freq: Once | ORAL | Status: AC
  Administered 2011-03-15: 60 mg via ORAL
  Filled 2011-03-15: qty 3

## 2011-03-15 MED ORDER — LIDOCAINE HCL (PF) 1 % IJ SOLN
INTRAMUSCULAR | Status: AC
  Administered 2011-03-15: 5 mL
  Filled 2011-03-15: qty 5

## 2011-03-15 MED ORDER — CEFTRIAXONE SODIUM 1 G IJ SOLR
1.0000 g | Freq: Once | INTRAMUSCULAR | Status: AC
  Administered 2011-03-15: 1 g via INTRAMUSCULAR
  Filled 2011-03-15: qty 10

## 2011-03-15 MED ORDER — AZITHROMYCIN 250 MG PO TABS: ORAL_TABLET | ORAL | Status: DC

## 2011-03-15 MED ORDER — FLUCONAZOLE 150 MG PO TABS: 150.0000 mg | ORAL_TABLET | Freq: Once | ORAL | Status: AC

## 2011-03-15 MED ORDER — DOXYCYCLINE HYCLATE 100 MG PO TABS
100.0000 mg | ORAL_TABLET | Freq: Once | ORAL | Status: AC
  Administered 2011-03-15: 100 mg via ORAL
  Filled 2011-03-15: qty 1
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
## 2011-03-15 NOTE — ED Notes (Signed)
Pt presents with cough. Pt states after coughing her sputum is blood tinged.

## 2011-03-15 NOTE — ED Notes (Signed)
Pt coughing up bloody sputum, shown to EDP , chest xray ordered.

## 2011-03-15 NOTE — ED Notes (Signed)
Pt states was dx with bronchitis 4 weeks ago, was given "a shot". Pt states has increasingly gotten worse over past several weeks.  Pt and significant other believe an anti depression medication is making her sick.   Pt states smokes a pack of cigarettes a day. Pt denies night sweats or loss of weight.

## 2011-03-15 NOTE — ED Notes (Signed)
MD at bedside.  Loney Laurence PA-C in at bedside discussing plan of care with pt.

## 2011-03-15 NOTE — Telephone Encounter (Signed)
Attempted to contact patient regarding normal holter monitor results without success.  Letter sent.

## 2011-03-16 NOTE — ED Provider Notes (Signed)
Medical screening examination/treatment/procedure(s) were performed by non-physician practitioner and as supervising physician I was immediately available for consultation/collaboration.  Raeford Razor, MD 03/16/11 [phone]

## 2011-03-20 ENCOUNTER — Encounter (HOSPITAL_COMMUNITY): Payer: Self-pay | Admitting: Emergency Medicine

## 2011-03-20 ENCOUNTER — Emergency Department (HOSPITAL_COMMUNITY)
Admission: EM | Admit: 2011-03-20 | Discharge: 2011-03-20 | Disposition: A | Discharge status: Home / Self Care | Payer: Medicaid Other | Attending: Emergency Medicine | Admitting: Emergency Medicine

## 2011-03-20 ENCOUNTER — Emergency Department (HOSPITAL_COMMUNITY): Payer: Medicaid Other

## 2011-03-20 DIAGNOSIS — K219 Gastro-esophageal reflux disease without esophagitis: Secondary | ICD-10-CM | POA: Insufficient documentation

## 2011-03-20 DIAGNOSIS — R05 Cough: Secondary | ICD-10-CM | POA: Insufficient documentation

## 2011-03-20 DIAGNOSIS — J189 Pneumonia, unspecified organism: Secondary | ICD-10-CM | POA: Insufficient documentation

## 2011-03-20 DIAGNOSIS — F17200 Nicotine dependence, unspecified, uncomplicated: Secondary | ICD-10-CM | POA: Insufficient documentation

## 2011-03-20 DIAGNOSIS — J3489 Other specified disorders of nose and nasal sinuses: Secondary | ICD-10-CM | POA: Insufficient documentation

## 2011-03-20 DIAGNOSIS — F411 Generalized anxiety disorder: Secondary | ICD-10-CM | POA: Insufficient documentation

## 2011-03-20 DIAGNOSIS — F3130 Bipolar disorder, current episode depressed, mild or moderate severity, unspecified: Secondary | ICD-10-CM | POA: Insufficient documentation

## 2011-03-20 DIAGNOSIS — R070 Pain in throat: Secondary | ICD-10-CM | POA: Insufficient documentation

## 2011-03-20 DIAGNOSIS — R059 Cough, unspecified: Secondary | ICD-10-CM | POA: Insufficient documentation

## 2011-03-20 DIAGNOSIS — Z79899 Other long term (current) drug therapy: Secondary | ICD-10-CM | POA: Insufficient documentation

## 2011-03-20 MED ORDER — BENZONATATE 100 MG PO CAPS
200.0000 mg | ORAL_CAPSULE | Freq: Once | ORAL | Status: AC
  Administered 2011-03-20: 200 mg via ORAL
  Filled 2011-03-20: qty 2

## 2011-03-20 MED ORDER — BENZONATATE 100 MG PO CAPS: 100.0000 mg | ORAL_CAPSULE | Freq: Three times a day (TID) | ORAL | Status: AC | PRN

## 2011-03-20 MED ORDER — DOXYCYCLINE HYCLATE 100 MG PO CAPS: 100.0000 mg | ORAL_CAPSULE | Freq: Two times a day (BID) | ORAL | Status: AC

## 2011-03-20 NOTE — ED Notes (Signed)
Patient c/o cough, congestion. Seen here last week per patient and diagnosed with bilateral pneumonia. Patient given prednisone, inhaler, and zithromax to treat. Patient reports she reports "i'm not coughing anything up"

## 2011-03-20 NOTE — ED Notes (Signed)
Pt presents with cough and chest congestion x 3-4 weeks. Pt has been seen multiple times for same but symptoms are not improving.

## 2011-03-20 NOTE — ED Provider Notes (Signed)
Medical screening examination/treatment/procedure(s) were conducted as a shared visit with non-physician practitioner(s) and myself.  I personally evaluated the patient during the encounter.  No obvious tachypnea. Chest x-ray improving. Continue antibiotic for several more days. Patient is nontoxic  Donnetta Hutching, MD 03/20/11 2050
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
## 2011-03-20 NOTE — ED Notes (Signed)
Pt a/ox4. Resp even and unlabored. NAD at this time. D/C instructions reviewed with pt. Pt verbalized understanding. Pt ambulated to lobby with steady gate.  

## 2011-03-24 ENCOUNTER — Institutional Professional Consult (permissible substitution): Payer: Medicaid Other | Admitting: Pulmonary Disease

## 2011-06-11 ENCOUNTER — Emergency Department (HOSPITAL_COMMUNITY): Payer: Medicaid Other

## 2011-06-11 ENCOUNTER — Encounter (HOSPITAL_COMMUNITY): Payer: Self-pay

## 2011-06-11 ENCOUNTER — Emergency Department (HOSPITAL_COMMUNITY)
Admission: EM | Admit: 2011-06-11 | Discharge: 2011-06-11 | Disposition: A | Discharge status: Home / Self Care | Payer: Medicaid Other | Attending: Emergency Medicine | Admitting: Emergency Medicine

## 2011-06-11 DIAGNOSIS — F319 Bipolar disorder, unspecified: Secondary | ICD-10-CM | POA: Insufficient documentation

## 2011-06-11 DIAGNOSIS — W1809XA Striking against other object with subsequent fall, initial encounter: Secondary | ICD-10-CM | POA: Insufficient documentation

## 2011-06-11 DIAGNOSIS — T23201A Burn of second degree of right hand, unspecified site, initial encounter: Secondary | ICD-10-CM

## 2011-06-11 DIAGNOSIS — M79609 Pain in unspecified limb: Secondary | ICD-10-CM | POA: Insufficient documentation

## 2011-06-11 DIAGNOSIS — Y93E1 Activity, personal bathing and showering: Secondary | ICD-10-CM | POA: Insufficient documentation

## 2011-06-11 DIAGNOSIS — Z79899 Other long term (current) drug therapy: Secondary | ICD-10-CM | POA: Insufficient documentation

## 2011-06-11 DIAGNOSIS — T23259A Burn of second degree of unspecified palm, initial encounter: Secondary | ICD-10-CM | POA: Insufficient documentation

## 2011-06-11 DIAGNOSIS — R079 Chest pain, unspecified: Secondary | ICD-10-CM | POA: Insufficient documentation

## 2011-06-11 DIAGNOSIS — F341 Dysthymic disorder: Secondary | ICD-10-CM | POA: Insufficient documentation

## 2011-06-11 DIAGNOSIS — K219 Gastro-esophageal reflux disease without esophagitis: Secondary | ICD-10-CM | POA: Insufficient documentation

## 2011-06-11 DIAGNOSIS — X19XXXA Contact with other heat and hot substances, initial encounter: Secondary | ICD-10-CM | POA: Insufficient documentation

## 2011-06-11 DIAGNOSIS — S2232XA Fracture of one rib, left side, initial encounter for closed fracture: Secondary | ICD-10-CM

## 2011-06-11 DIAGNOSIS — S2239XA Fracture of one rib, unspecified side, initial encounter for closed fracture: Secondary | ICD-10-CM | POA: Insufficient documentation

## 2011-06-11 MED ORDER — IBUPROFEN 800 MG PO TABS
800.0000 mg | ORAL_TABLET | Freq: Once | ORAL | Status: AC
  Administered 2011-06-11: 800 mg via ORAL
  Filled 2011-06-11: qty 1

## 2011-06-11 MED ORDER — OXYCODONE-ACETAMINOPHEN 5-325 MG PO TABS: 1.0000 | ORAL_TABLET | Freq: Four times a day (QID) | ORAL | Status: DC | PRN

## 2011-06-11 MED ORDER — OXYCODONE-ACETAMINOPHEN 5-325 MG PO TABS
1.0000 | ORAL_TABLET | Freq: Once | ORAL | Status: AC
  Administered 2011-06-11: 1 via ORAL
  Filled 2011-06-11: qty 1

## 2011-06-11 NOTE — Discharge Instructions (Signed)
Burn Care Your skin is a natural barrier to infection. It is the largest organ of your body. Burns damage this natural protection. To help prevent infection, it is very important to follow your caregiver's instructions in the care of your burn. Burns are classified as:  First degree. There is only redness of the skin (erythema). No scarring is expected.   Second degree. There is blistering of the skin. Scarring may occur with deeper burns.   Third degree. All layers of the skin are injured, and scarring is expected.  HOME CARE INSTRUCTIONS   Wash your hands well before changing your bandage.   Change your bandage as often as directed by your caregiver.   Remove the old bandage. If the bandage sticks, you may soak it off with cool, clean water.   Cleanse the burn thoroughly but gently with mild soap and water.   Pat the area dry with a clean, dry cloth.   Apply a thin layer of antibacterial cream to the burn.   Apply a clean bandage as instructed by your caregiver.   Keep the bandage as clean and dry as possible.   Elevate the affected area for the first 24 hours, then as instructed by your caregiver.   Only take over-the-counter or prescription medicines for pain, discomfort, or fever as directed by your caregiver.  SEEK IMMEDIATE MEDICAL CARE IF:   You develop excessive pain.   You develop redness, tenderness, swelling, or red streaks near the burn.   The burned area develops yellowish-white fluid (pus) or a bad smell.   You have a fever.  MAKE SURE YOU:   Understand these instructions.   Will watch your condition.   Will get help right away if you are not doing well or get worse.  Document Released: 03/21/2005 Document Revised: 03/10/2011 Document Reviewed: 08/11/2010 [facility] Patient Information 2012 [place], [place].Rib Fracture Your caregiver has diagnosed you as having a rib fracture (a break). This can occur by a blow to the chest, by a fall against a hard  object, or by violent coughing or sneezing. There may be one or many breaks. Rib fractures may heal on their own within 3 to 8 weeks. The longer healing period is usually associated with a continued cough or other aggravating activities. HOME CARE INSTRUCTIONS   Avoid strenuous activity. Be careful during activities and avoid bumping the injured rib. Activities that cause pain pull on the fracture site(s) and are best avoided if possible.   Eat a normal, well-balanced diet. Drink plenty of fluids to avoid constipation.   Take deep breaths several times a day to keep lungs free of infection. Try to cough several times a day, splinting the injured area with a pillow. This will help prevent pneumonia.   Do not wear a rib belt or binder. These restrict breathing which can lead to pneumonia.   Only take over-the-counter or prescription medicines for pain, discomfort, or fever as directed by your caregiver.  SEEK MEDICAL CARE IF:  You develop a continual cough, associated with thick or bloody sputum. SEEK IMMEDIATE MEDICAL CARE IF:   You have a fever.   You have difficulty breathing.   You have nausea (feeling sick to your stomach), vomiting, or abdominal (belly) pain.   You have worsening pain, not controlled with medications.  Document Released: 03/21/2005 Document Revised: 03/10/2011 Document Reviewed: 08/23/2006 [facility] Patient Information 2012 [place], [place].   Take the pain medicine as directed.  Take ibuprofen 800 mg every 8 hrs with  food.  Wear the rib belt for comfort and use your incentive spirometer every 2 hrs while awake.  Follow up with yoy MD as planned.
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
## 2011-06-14 ENCOUNTER — Emergency Department (HOSPITAL_COMMUNITY)
Admission: EM | Admit: 2011-06-14 | Discharge: 2011-06-14 | Disposition: A | Discharge status: Home / Self Care | Payer: Medicaid Other | Attending: Emergency Medicine | Admitting: Emergency Medicine

## 2011-06-14 ENCOUNTER — Encounter (HOSPITAL_COMMUNITY): Payer: Self-pay

## 2011-06-14 DIAGNOSIS — M5137 Other intervertebral disc degeneration, lumbosacral region: Secondary | ICD-10-CM | POA: Insufficient documentation

## 2011-06-14 DIAGNOSIS — S2239XA Fracture of one rib, unspecified side, initial encounter for closed fracture: Secondary | ICD-10-CM | POA: Insufficient documentation

## 2011-06-14 DIAGNOSIS — M51379 Other intervertebral disc degeneration, lumbosacral region without mention of lumbar back pain or lower extremity pain: Secondary | ICD-10-CM | POA: Insufficient documentation

## 2011-06-14 DIAGNOSIS — Y9289 Other specified places as the place of occurrence of the external cause: Secondary | ICD-10-CM | POA: Insufficient documentation

## 2011-06-14 DIAGNOSIS — W19XXXA Unspecified fall, initial encounter: Secondary | ICD-10-CM | POA: Insufficient documentation

## 2011-06-14 DIAGNOSIS — R0781 Pleurodynia: Secondary | ICD-10-CM

## 2011-06-14 DIAGNOSIS — K219 Gastro-esophageal reflux disease without esophagitis: Secondary | ICD-10-CM | POA: Insufficient documentation

## 2011-06-14 DIAGNOSIS — F319 Bipolar disorder, unspecified: Secondary | ICD-10-CM | POA: Insufficient documentation

## 2011-06-14 MED ORDER — OXYCODONE-ACETAMINOPHEN 5-325 MG PO TABS
1.0000 | ORAL_TABLET | Freq: Once | ORAL | Status: AC
  Administered 2011-06-14: 1 via ORAL
  Filled 2011-06-14: qty 1

## 2011-06-14 MED ORDER — OXYCODONE-ACETAMINOPHEN 5-325 MG PO TABS: 1.0000 | ORAL_TABLET | ORAL | Status: DC | PRN
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
## 2011-06-14 NOTE — ED Notes (Signed)
Pt seen and assessed by EDPa. 
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
## 2011-06-16 ENCOUNTER — Ambulatory Visit
Admission: RE | Admit: 2011-06-16 | Discharge: 2011-06-16 | Disposition: A | Discharge status: Home / Self Care | Payer: Medicaid Other | Source: Ambulatory Visit | Attending: Family Medicine | Admitting: Family Medicine

## 2011-06-16 ENCOUNTER — Other Ambulatory Visit: Payer: Self-pay | Admitting: Family Medicine

## 2011-06-16 DIAGNOSIS — R0601 Orthopnea: Secondary | ICD-10-CM

## 2011-06-16 DIAGNOSIS — R0989 Other specified symptoms and signs involving the circulatory and respiratory systems: Secondary | ICD-10-CM

## 2011-06-16 NOTE — ED Provider Notes (Signed)
Medical screening examination/treatment/procedure(s) were performed by non-physician practitioner and as supervising physician I was immediately available for consultation/collaboration.   Barlow Harrison, MD 06/16/11 1515 

## 2011-06-20 ENCOUNTER — Encounter (HOSPITAL_COMMUNITY): Payer: Self-pay | Admitting: *Deleted

## 2011-06-20 ENCOUNTER — Emergency Department (HOSPITAL_COMMUNITY)
Admission: EM | Admit: 2011-06-20 | Discharge: 2011-06-20 | Disposition: A | Discharge status: Home / Self Care | Payer: Medicaid Other | Attending: Emergency Medicine | Admitting: Emergency Medicine

## 2011-06-20 DIAGNOSIS — R05 Cough: Secondary | ICD-10-CM | POA: Insufficient documentation

## 2011-06-20 DIAGNOSIS — F319 Bipolar disorder, unspecified: Secondary | ICD-10-CM | POA: Insufficient documentation

## 2011-06-20 DIAGNOSIS — J189 Pneumonia, unspecified organism: Secondary | ICD-10-CM

## 2011-06-20 DIAGNOSIS — R059 Cough, unspecified: Secondary | ICD-10-CM | POA: Insufficient documentation

## 2011-06-20 DIAGNOSIS — K219 Gastro-esophageal reflux disease without esophagitis: Secondary | ICD-10-CM | POA: Insufficient documentation

## 2011-06-20 DIAGNOSIS — G8929 Other chronic pain: Secondary | ICD-10-CM | POA: Insufficient documentation

## 2011-06-20 DIAGNOSIS — R079 Chest pain, unspecified: Secondary | ICD-10-CM | POA: Insufficient documentation

## 2011-06-20 DIAGNOSIS — R509 Fever, unspecified: Secondary | ICD-10-CM | POA: Insufficient documentation

## 2011-06-20 DIAGNOSIS — R0602 Shortness of breath: Secondary | ICD-10-CM | POA: Insufficient documentation

## 2011-06-20 DIAGNOSIS — R0789 Other chest pain: Secondary | ICD-10-CM

## 2011-06-20 DIAGNOSIS — M549 Dorsalgia, unspecified: Secondary | ICD-10-CM | POA: Insufficient documentation

## 2011-06-20 MED ORDER — IPRATROPIUM BROMIDE 0.02 % IN SOLN
0.5000 mg | Freq: Once | RESPIRATORY_TRACT | Status: AC
  Administered 2011-06-20: 0.5 mg via RESPIRATORY_TRACT
  Filled 2011-06-20: qty 2.5

## 2011-06-20 MED ORDER — OXYCODONE-ACETAMINOPHEN 5-325 MG PO TABS
ORAL_TABLET | ORAL | Status: AC
  Filled 2011-06-20: qty 1

## 2011-06-20 MED ORDER — ALBUTEROL SULFATE HFA 108 (90 BASE) MCG/ACT IN AERS
2.0000 | INHALATION_SPRAY | Freq: Four times a day (QID) | RESPIRATORY_TRACT | Status: DC | PRN
  Administered 2011-06-20: 2 via RESPIRATORY_TRACT
  Filled 2011-06-20: qty 6.7

## 2011-06-20 MED ORDER — ALBUTEROL SULFATE (5 MG/ML) 0.5% IN NEBU
5.0000 mg | INHALATION_SOLUTION | Freq: Once | RESPIRATORY_TRACT | Status: AC
  Administered 2011-06-20: 2.5 mg via RESPIRATORY_TRACT
  Filled 2011-06-20: qty 0.5

## 2011-06-20 MED ORDER — OXYCODONE-ACETAMINOPHEN 5-325 MG PO TABS: 2.0000 | ORAL_TABLET | ORAL | Status: DC | PRN

## 2011-06-20 MED ORDER — KETOROLAC TROMETHAMINE 30 MG/ML IJ SOLN
30.0000 mg | Freq: Once | INTRAMUSCULAR | Status: AC
  Administered 2011-06-20: 30 mg via INTRAMUSCULAR
  Filled 2011-06-20: qty 1

## 2011-06-20 MED ORDER — OXYCODONE-ACETAMINOPHEN 5-325 MG PO TABS
2.0000 | ORAL_TABLET | Freq: Once | ORAL | Status: AC
  Administered 2011-06-20: 2 via ORAL
  Filled 2011-06-20: qty 1
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
## 2011-06-20 NOTE — ED Notes (Signed)
Respiratory paged for breathing treatments.  

## 2011-06-20 NOTE — ED Notes (Signed)
Patient given Percocet 6 pack, dispensed from [facility] per MD Ghim order.

## 2011-06-20 NOTE — ED Provider Notes (Signed)
Medical screening examination/treatment/procedure(s) were performed by non-physician practitioner and as supervising physician I was immediately available for consultation/collaboration.   Joya Gaskins, MD 06/20/11 [phone]
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
## 2011-06-20 NOTE — ED Notes (Signed)
Patient with no complaints at this time. Respirations even and unlabored. Skin warm/dry. Discharge instructions reviewed with patient at this time. Patient given opportunity to voice concerns/ask questions. Patient discharged at this time and left Emergency Department with steady gait.   

## 2011-06-20 NOTE — Discharge Instructions (Signed)
Pneumonia, Adult Pneumonia is an infection of the lungs.  CAUSES Pneumonia may be caused by bacteria or a virus. Usually, these infections are caused by breathing infectious particles into the lungs (respiratory tract). SYMPTOMS   Cough.   Fever.   Chest pain.   Increased rate of breathing.   Wheezing.   Mucus production.  DIAGNOSIS  If you have the common symptoms of pneumonia, your caregiver will typically confirm the diagnosis with a chest X-ray. The X-ray will show an abnormality in the lung (pulmonary infiltrate) if you have pneumonia. Other tests of your blood, urine, or sputum may be done to find the specific cause of your pneumonia. Your caregiver may also do tests (blood gases or pulse oximetry) to see how well your lungs are working. TREATMENT  Some forms of pneumonia may be spread to other people when you cough or sneeze. You may be asked to wear a mask before and during your exam. Pneumonia that is caused by bacteria is treated with antibiotic medicine. Pneumonia that is caused by the influenza virus may be treated with an antiviral medicine. Most other viral infections must run their course. These infections will not respond to antibiotics.  PREVENTION A pneumococcal shot (vaccine) is available to prevent a common bacterial cause of pneumonia. This is usually suggested for:  People over 65 years old.   Patients on chemotherapy.   People with chronic lung problems, such as bronchitis or emphysema.   People with immune system problems.  If you are over 65 or have a high risk condition, you may receive the pneumococcal vaccine if you have not received it before. In some countries, a routine influenza vaccine is also recommended. This vaccine can help prevent some cases of pneumonia.You may be offered the influenza vaccine as part of your care. If you smoke, it is time to quit. You may receive instructions on how to stop smoking. Your caregiver can provide medicines and  counseling to help you quit. HOME CARE INSTRUCTIONS   Cough suppressants may be used if you are losing too much rest. However, coughing protects you by clearing your lungs. You should avoid using cough suppressants if you can.   Your caregiver may have prescribed medicine if he or she thinks your pneumonia is caused by a bacteria or influenza. Finish your medicine even if you start to feel better.   Your caregiver may also prescribe an expectorant. This loosens the mucus to be coughed up.   Only take over-the-counter or prescription medicines for pain, discomfort, or fever as directed by your caregiver.   Do not smoke. Smoking is a common cause of bronchitis and can contribute to pneumonia. If you are a smoker and continue to smoke, your cough may last several weeks after your pneumonia has cleared.   A cold steam vaporizer or humidifier in your room or home may help loosen mucus.   Coughing is often worse at night. Sleeping in a semi-upright position in a recliner or using a couple pillows under your head will help with this.   Get rest as you feel it is needed. Your body will usually let you know when you need to rest.  SEEK IMMEDIATE MEDICAL CARE IF:   Your illness becomes worse. This is especially true if you are elderly or weakened from any other disease.   You cannot control your cough with suppressants and are losing sleep.   You begin coughing up blood.   You develop pain which is getting worse or   is uncontrolled with medicines.   You have a fever.   Any of the symptoms which initially brought you in for treatment are getting worse rather than better.   You develop shortness of breath or chest pain.  MAKE SURE YOU:   Understand these instructions.   Will watch your condition.   Will get help right away if you are not doing well or get worse.  Document Released: 03/21/2005 Document Revised: 03/10/2011 Document Reviewed: 06/10/2010 [facility] Patient Information 2012  [place], [place].   Narcotic and benzodiazepine use may cause drowsiness, slowed breathing or dependence.  Please use with caution and do not drive, operate machinery or watch young children alone while taking them.  Taking combinations of these medications or drinking alcohol will potentiate these effects.  Use albuterol inhaler every 4-6 hours, 2 puffs, as needed for wheezing and shortness of breath.

## 2011-06-23 ENCOUNTER — Encounter (HOSPITAL_COMMUNITY): Payer: Self-pay | Admitting: *Deleted

## 2011-06-23 ENCOUNTER — Emergency Department (HOSPITAL_COMMUNITY)
Admission: EM | Admit: 2011-06-23 | Discharge: 2011-06-23 | Disposition: A | Discharge status: Home / Self Care | Payer: Medicaid Other | Attending: Emergency Medicine | Admitting: Emergency Medicine

## 2011-06-23 ENCOUNTER — Emergency Department (HOSPITAL_COMMUNITY): Payer: Medicaid Other

## 2011-06-23 DIAGNOSIS — S2239XA Fracture of one rib, unspecified side, initial encounter for closed fracture: Secondary | ICD-10-CM | POA: Insufficient documentation

## 2011-06-23 DIAGNOSIS — Y92009 Unspecified place in unspecified non-institutional (private) residence as the place of occurrence of the external cause: Secondary | ICD-10-CM | POA: Insufficient documentation

## 2011-06-23 DIAGNOSIS — R0602 Shortness of breath: Secondary | ICD-10-CM | POA: Insufficient documentation

## 2011-06-23 DIAGNOSIS — Z79899 Other long term (current) drug therapy: Secondary | ICD-10-CM | POA: Insufficient documentation

## 2011-06-23 DIAGNOSIS — J189 Pneumonia, unspecified organism: Secondary | ICD-10-CM

## 2011-06-23 DIAGNOSIS — F341 Dysthymic disorder: Secondary | ICD-10-CM | POA: Insufficient documentation

## 2011-06-23 DIAGNOSIS — W19XXXA Unspecified fall, initial encounter: Secondary | ICD-10-CM | POA: Insufficient documentation

## 2011-06-23 DIAGNOSIS — G8929 Other chronic pain: Secondary | ICD-10-CM | POA: Insufficient documentation

## 2011-06-23 MED ORDER — CEFTRIAXONE SODIUM 1 G IJ SOLR
1.0000 g | Freq: Once | INTRAMUSCULAR | Status: AC
  Administered 2011-06-23: 1 g via INTRAMUSCULAR
  Filled 2011-06-23: qty 10

## 2011-06-23 MED ORDER — AZITHROMYCIN 250 MG PO TABS
500.0000 mg | ORAL_TABLET | Freq: Once | ORAL | Status: AC
  Administered 2011-06-23: 500 mg via ORAL
  Filled 2011-06-23: qty 2

## 2011-06-23 MED ORDER — OXYCODONE-ACETAMINOPHEN 5-325 MG PO TABS: 1.0000 | ORAL_TABLET | ORAL | Status: AC | PRN

## 2011-06-23 MED ORDER — LIDOCAINE HCL (PF) 1 % IJ SOLN
INTRAMUSCULAR | Status: AC
  Administered 2011-06-23: 23:00:00
  Filled 2011-06-23: qty 5

## 2011-06-23 MED ORDER — OXYCODONE-ACETAMINOPHEN 5-325 MG PO TABS
1.0000 | ORAL_TABLET | Freq: Once | ORAL | Status: AC
  Administered 2011-06-23: 1 via ORAL
  Filled 2011-06-23: qty 1

## 2011-06-23 MED ORDER — AZITHROMYCIN 250 MG PO TABS: ORAL_TABLET | ORAL | Status: DC

## 2011-06-23 MED ORDER — KETOROLAC TROMETHAMINE 60 MG/2ML IM SOLN
60.0000 mg | Freq: Once | INTRAMUSCULAR | Status: AC
  Administered 2011-06-23: 60 mg via INTRAMUSCULAR
  Filled 2011-06-23: qty 2

## 2011-06-23 NOTE — ED Notes (Signed)
Pt was recently seen here for same c/o of cough and bilateral rib pain

## 2011-06-23 NOTE — Discharge Instructions (Signed)
Pneumonia, Adult Pneumonia is an infection of the lungs.  CAUSES Pneumonia may be caused by bacteria or a virus. Usually, these infections are caused by breathing infectious particles into the lungs (respiratory tract). SYMPTOMS   Cough.   Fever.   Chest pain.   Increased rate of breathing.   Wheezing.   Mucus production.  DIAGNOSIS  If you have the common symptoms of pneumonia, your caregiver will typically confirm the diagnosis with a chest X-ray. The X-ray will show an abnormality in the lung (pulmonary infiltrate) if you have pneumonia. Other tests of your blood, urine, or sputum may be done to find the specific cause of your pneumonia. Your caregiver may also do tests (blood gases or pulse oximetry) to see how well your lungs are working. TREATMENT  Some forms of pneumonia may be spread to other people when you cough or sneeze. You may be asked to wear a mask before and during your exam. Pneumonia that is caused by bacteria is treated with antibiotic medicine. Pneumonia that is caused by the influenza virus may be treated with an antiviral medicine. Most other viral infections must run their course. These infections will not respond to antibiotics.  PREVENTION A pneumococcal shot (vaccine) is available to prevent a common bacterial cause of pneumonia. This is usually suggested for:  People over 65 years old.   Patients on chemotherapy.   People with chronic lung problems, such as bronchitis or emphysema.   People with immune system problems.  If you are over 65 or have a high risk condition, you may receive the pneumococcal vaccine if you have not received it before. In some countries, a routine influenza vaccine is also recommended. This vaccine can help prevent some cases of pneumonia.You may be offered the influenza vaccine as part of your care. If you smoke, it is time to quit. You may receive instructions on how to stop smoking. Your caregiver can provide medicines and  counseling to help you quit. HOME CARE INSTRUCTIONS   Cough suppressants may be used if you are losing too much rest. However, coughing protects you by clearing your lungs. You should avoid using cough suppressants if you can.   Your caregiver may have prescribed medicine if he or she thinks your pneumonia is caused by a bacteria or influenza. Finish your medicine even if you start to feel better.   Your caregiver may also prescribe an expectorant. This loosens the mucus to be coughed up.   Only take over-the-counter or prescription medicines for pain, discomfort, or fever as directed by your caregiver.   Do not smoke. Smoking is a common cause of bronchitis and can contribute to pneumonia. If you are a smoker and continue to smoke, your cough may last several weeks after your pneumonia has cleared.   A cold steam vaporizer or humidifier in your room or home may help loosen mucus.   Coughing is often worse at night. Sleeping in a semi-upright position in a recliner or using a couple pillows under your head will help with this.   Get rest as you feel it is needed. Your body will usually let you know when you need to rest.  SEEK IMMEDIATE MEDICAL CARE IF:   Your illness becomes worse. This is especially true if you are elderly or weakened from any other disease.   You cannot control your cough with suppressants and are losing sleep.   You begin coughing up blood.   You develop pain which is getting worse or   is uncontrolled with medicines.   You have a fever.   Any of the symptoms which initially brought you in for treatment are getting worse rather than better.   You develop shortness of breath or chest pain.  MAKE SURE YOU:   Understand these instructions.   Will watch your condition.   Will get help right away if you are not doing well or get worse.  Document Released: 03/21/2005 Document Revised: 03/10/2011 Document Reviewed: 06/10/2010 [facility] Patient Information 2012  [place], [place].Rib Fracture Your caregiver has diagnosed you as having a rib fracture (a break). This can occur by a blow to the chest, by a fall against a hard object, or by violent coughing or sneezing. There may be one or many breaks. Rib fractures may heal on their own within 3 to 8 weeks. The longer healing period is usually associated with a continued cough or other aggravating activities. HOME CARE INSTRUCTIONS   Avoid strenuous activity. Be careful during activities and avoid bumping the injured rib. Activities that cause pain pull on the fracture site(s) and are best avoided if possible.   Eat a normal, well-balanced diet. Drink plenty of fluids to avoid constipation.   Take deep breaths several times a day to keep lungs free of infection. Try to cough several times a day, splinting the injured area with a pillow. This will help prevent pneumonia.   Do not wear a rib belt or binder. These restrict breathing which can lead to pneumonia.   Only take over-the-counter or prescription medicines for pain, discomfort, or fever as directed by your caregiver.  SEEK MEDICAL CARE IF:  You develop a continual cough, associated with thick or bloody sputum. SEEK IMMEDIATE MEDICAL CARE IF:   You have a fever.   You have difficulty breathing.   You have nausea (feeling sick to your stomach), vomiting, or abdominal (belly) pain.   You have worsening pain, not controlled with medications.  Document Released: 03/21/2005 Document Revised: 03/10/2011 Document Reviewed: 08/23/2006 [facility] Patient Information 2012 [place], [place].
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
## 2011-06-23 NOTE — ED Provider Notes (Signed)
Medical screening examination/treatment/procedure(s) were conducted as a shared visit with non-physician practitioner(s) and myself.  I personally evaluated the patient during the encounter  Pt non-toxic appearing and will be given im rocephin and will be placed on a z-pack, will f/u her pcp this week  Toy Baker, MD 06/23/11 2205

## 2011-06-25 NOTE — ED Provider Notes (Signed)
Medical screening examination/treatment/procedure(s) were conducted as a shared visit with non-physician practitioner(s) and myself.  I personally evaluated the patient during the encounter  Toy Baker, MD 06/25/11 1012

## 2011-07-06 ENCOUNTER — Emergency Department (HOSPITAL_COMMUNITY)
Admission: EM | Admit: 2011-07-06 | Discharge: 2011-07-06 | Disposition: A | Discharge status: Home / Self Care | Payer: Medicaid Other | Attending: Emergency Medicine | Admitting: Emergency Medicine

## 2011-07-06 ENCOUNTER — Emergency Department (HOSPITAL_COMMUNITY): Payer: Medicaid Other

## 2011-07-06 ENCOUNTER — Encounter (HOSPITAL_COMMUNITY): Payer: Self-pay | Admitting: Emergency Medicine

## 2011-07-06 DIAGNOSIS — W19XXXA Unspecified fall, initial encounter: Secondary | ICD-10-CM | POA: Insufficient documentation

## 2011-07-06 DIAGNOSIS — S2239XA Fracture of one rib, unspecified side, initial encounter for closed fracture: Secondary | ICD-10-CM

## 2011-07-06 DIAGNOSIS — Z79899 Other long term (current) drug therapy: Secondary | ICD-10-CM | POA: Insufficient documentation

## 2011-07-06 DIAGNOSIS — S2249XA Multiple fractures of ribs, unspecified side, initial encounter for closed fracture: Secondary | ICD-10-CM | POA: Insufficient documentation

## 2011-07-06 DIAGNOSIS — R0789 Other chest pain: Secondary | ICD-10-CM

## 2011-07-06 DIAGNOSIS — M549 Dorsalgia, unspecified: Secondary | ICD-10-CM | POA: Insufficient documentation

## 2011-07-06 DIAGNOSIS — G8929 Other chronic pain: Secondary | ICD-10-CM | POA: Insufficient documentation

## 2011-07-06 DIAGNOSIS — F319 Bipolar disorder, unspecified: Secondary | ICD-10-CM | POA: Insufficient documentation

## 2011-07-06 HISTORY — DX: Pneumonia, unspecified organism: J18.9

## 2011-07-06 LAB — DIFFERENTIAL
Basophils Absolute: 0 10*3/uL (ref 0.0–0.1)
Basophils Relative: 0 % (ref 0–1)
Eosinophils Absolute: 0.3 10*3/uL (ref 0.0–0.7)
Neutro Abs: 5.6 10*3/uL (ref 1.7–7.7)
Neutrophils Relative %: 56 % (ref 43–77)

## 2011-07-06 LAB — CBC
MCH: 36.3 pg — ABNORMAL HIGH (ref 26.0–34.0)
MCHC: 33.6 g/dL (ref 30.0–36.0)
RDW: 15 % (ref 11.5–15.5)

## 2011-07-06 LAB — BASIC METABOLIC PANEL
BUN: 21 mg/dL (ref 6–23)
Chloride: 105 mEq/L (ref 96–112)
Creatinine, Ser: 0.76 mg/dL (ref 0.50–1.10)
GFR calc Af Amer: >90 mL/min (ref >90)
GFR calc non Af Amer: >90 mL/min (ref >90)
Potassium: 3.9 mEq/L (ref 3.5–5.1)

## 2011-07-06 MED ORDER — KETOROLAC TROMETHAMINE 30 MG/ML IJ SOLN
60.0000 mg | Freq: Once | INTRAMUSCULAR | Status: AC
  Administered 2011-07-06: 60 mg via INTRAMUSCULAR
  Filled 2011-07-06: qty 2

## 2011-07-06 MED ORDER — OXYCODONE-ACETAMINOPHEN 5-325 MG PO TABS: ORAL_TABLET | ORAL | Status: DC

## 2011-07-06 NOTE — ED Notes (Signed)
PT. REPORTS PERSISTENT SOB WITH LEFT ANTERIOR RIBCAGE PAIN , PRODUCTIVE COUGH AND CHILLS AND GENERALIZED WEAKNESS FOR SEVERAL DAYS , STATES DIAGNOSED WITH PNEUMONIA SEVERAL WEEKS AGO TREATED WITH Z-PACK.

## 2011-07-06 NOTE — ED Notes (Signed)
Fiance at [facility]

## 2011-07-06 NOTE — ED Provider Notes (Signed)
Medical screening examination/treatment/procedure(s) were performed by non-physician practitioner and as supervising physician I was immediately available for consultation/collaboration.  Baylea Milburn R. Natash Berman, MD 07/06/11 2345 

## 2011-07-06 NOTE — Discharge Instructions (Signed)
It is VERY important to follow up with Dr. Phineas Real for ongoing management of rib pain associated with rib fractures but it is also VERY important to quit smoking. Alternate percocet with ibuprofen for added management pain management. Return to ER for emergent changing or worsening of symptoms.   Rib Fracture The ribs are like a cage that goes around your upper chest. The ribs protect your lungs. Your ribs move when you breathe, so it hurts if a rib is broken. HOME CARE  Avoid activities that cause pain to the injured area. Protect your injured area.   Eat a normal, healthy diet.   Drink enough fluids to keep your pee (urine) clear or pale yellow.   Take deep breaths many times a day. Cough many times a day while hugging a pillow.   Do not wear a rib belt or binder on the chest unless told by your doctor. Rib belts or binders do not allow you to breathe deeply.   Only take medicine as told by your doctor.  GET HELP RIGHT AWAY IF:   You have a fever.   You cannot stop coughing or cough up thick or bloody spit (mucus).   You have trouble breathing.   You feel sick to your stomach (nauseous), throw up (vomit), or have belly (abdominal) pain.   Your pain gets worse and medicine does not help.  MAKE SURE YOU:   Understand these instructions.   Will watch this condition.   Will get help right away if you are not doing well or get worse.  Document Released: 12/29/2007 Document Revised: 03/10/2011 Document Reviewed: 12/29/2007 [facility] Patient Information 2012 [place], [place].
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
## 2011-07-12 ENCOUNTER — Encounter (HOSPITAL_COMMUNITY): Payer: Self-pay | Admitting: *Deleted

## 2011-07-12 ENCOUNTER — Emergency Department (HOSPITAL_COMMUNITY)
Admission: EM | Admit: 2011-07-12 | Discharge: 2011-07-12 | Disposition: A | Discharge status: Home / Self Care | Payer: Medicaid Other | Attending: Emergency Medicine | Admitting: Emergency Medicine

## 2011-07-12 DIAGNOSIS — Z79899 Other long term (current) drug therapy: Secondary | ICD-10-CM | POA: Insufficient documentation

## 2011-07-12 DIAGNOSIS — X58XXXA Exposure to other specified factors, initial encounter: Secondary | ICD-10-CM | POA: Insufficient documentation

## 2011-07-12 DIAGNOSIS — R071 Chest pain on breathing: Secondary | ICD-10-CM | POA: Insufficient documentation

## 2011-07-12 DIAGNOSIS — G8929 Other chronic pain: Secondary | ICD-10-CM | POA: Insufficient documentation

## 2011-07-12 DIAGNOSIS — K219 Gastro-esophageal reflux disease without esophagitis: Secondary | ICD-10-CM | POA: Insufficient documentation

## 2011-07-12 DIAGNOSIS — R4789 Other speech disturbances: Secondary | ICD-10-CM | POA: Insufficient documentation

## 2011-07-12 DIAGNOSIS — F17200 Nicotine dependence, unspecified, uncomplicated: Secondary | ICD-10-CM | POA: Insufficient documentation

## 2011-07-12 DIAGNOSIS — F3130 Bipolar disorder, current episode depressed, mild or moderate severity, unspecified: Secondary | ICD-10-CM | POA: Insufficient documentation

## 2011-07-12 DIAGNOSIS — S2239XA Fracture of one rib, unspecified side, initial encounter for closed fracture: Secondary | ICD-10-CM | POA: Insufficient documentation

## 2011-07-12 DIAGNOSIS — F411 Generalized anxiety disorder: Secondary | ICD-10-CM | POA: Insufficient documentation

## 2011-07-12 MED ORDER — OXYCODONE-ACETAMINOPHEN 5-325 MG PO TABS
2.0000 | ORAL_TABLET | Freq: Once | ORAL | Status: AC
  Administered 2011-07-12: 2 via ORAL
  Filled 2011-07-12: qty 2
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
## 2011-07-12 NOTE — ED Notes (Signed)
Patient given discharge paperwork; went over discharge instructions with patient.  Patient instructed to follow up with primary care physician and pain clinic; patient given copy of past prescriptions by charge RN.  Patient still upset that she is not receiving x-ray; patient informed that this is PA's final decision.

## 2011-07-12 NOTE — ED Notes (Signed)
Patient reports she was seen last week by her MD.  She is complaining of rib pain on the left side.  She states she has been having uri sx,  Recent xray showed no pneumonia.  She has fx rib.

## 2011-07-12 NOTE — ED Notes (Signed)
Patient currently resting quietly in bed; no respiratory or acute distress noted.  Patient updated on plan of care; informed patient that we are currently waiting on discharge paperwork from PA.  Patient requesting chest x-ray and pain medication prescription.  PA notified.  Patient has no other questions or concerns at this time; will continue to monitor.
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
## 2011-07-12 NOTE — ED Notes (Signed)
PA at bedside.

## 2011-07-12 NOTE — ED Provider Notes (Signed)
Medical screening examination/treatment/procedure(s) were performed by non-physician practitioner and as supervising physician I was immediately available for consultation/collaboration.   Dayton Bailiff, MD 07/12/11 2128

## 2011-07-12 NOTE — ED Notes (Signed)
Patient upset about disposition; patient states that she wants a chest x-ray and an abdominal x-ray.  Patient also requesting print off of medications that she has received during the past month.  Charge RN notified; charge now at bedside.

## 2011-07-12 NOTE — ED Notes (Signed)
Patient advocate at bedside.

## 2011-07-12 NOTE — Discharge Instructions (Signed)
It is VERY important to follow up with Dr. Phineas Real to further address your ongoing pain associated with rib fractures. Return to ER for emergent changing or worsening of symptoms.   Chronic Pain Management Managing chronic pain is not easy. The goal is to provide as much pain relief as possible. There are emotional as well as physical problems. Chronic pain may lead to symptoms of depression which magnify those of the pain. Problems may include:  Anxiety.   Sleep disturbances.   Confused thinking.   Feeling cranky.   Fatigue.   Weight gain or loss.  Identify the source of the pain first, if possible. The pain may be masking another problem. Try to find a pain management specialist or clinic. Work with a team to create a treatment plan for you. MEDICATIONS  May include narcotics or opioids. Larger than normal doses may be needed to control your pain.   Drugs for depression may help.   Over-the-counter medicines may help for some conditions. These drugs may be used along with others for better pain relief.   May be injected into sites such as the spine and joints. Injections may have to be repeated if they wear off.  THERAPY MAY INCLUDE:  Working with a physical therapist to keep from getting stiff.   Regular, gentle exercise.   Cognitive or behavioral therapy.   Using complementary or integrative medicine such as:   Acupuncture.   Massage, Reiki, or Rolfing.   Aroma, color, light, or sound therapy.   Group support.  FOR MORE INFORMATION [email or web address]. [email or web address]. Document Released: 04/28/2004 Document Revised: 03/10/2011 Document Reviewed: 06/07/2007 [facility] Patient Information 2012 [place], [place].

## 2011-08-03 ENCOUNTER — Encounter: Payer: Medicaid Other | Admitting: Physical Medicine and Rehabilitation

## 2011-08-19 ENCOUNTER — Encounter: Payer: Self-pay | Admitting: Physical Medicine and Rehabilitation

## 2011-08-19 ENCOUNTER — Encounter
Payer: Medicaid Other | Attending: Physical Medicine and Rehabilitation | Admitting: Physical Medicine and Rehabilitation

## 2011-08-19 VITALS — BP 136/78 | HR 81 | Resp 16 | Ht 65.0 in | Wt 158.6 lb

## 2011-08-19 DIAGNOSIS — G8929 Other chronic pain: Secondary | ICD-10-CM | POA: Insufficient documentation

## 2011-08-19 DIAGNOSIS — R29898 Other symptoms and signs involving the musculoskeletal system: Secondary | ICD-10-CM | POA: Insufficient documentation

## 2011-08-19 DIAGNOSIS — M545 Low back pain, unspecified: Secondary | ICD-10-CM | POA: Insufficient documentation

## 2011-08-19 DIAGNOSIS — M25561 Pain in right knee: Secondary | ICD-10-CM

## 2011-08-19 DIAGNOSIS — M79604 Pain in right leg: Secondary | ICD-10-CM

## 2011-08-19 DIAGNOSIS — M25562 Pain in left knee: Secondary | ICD-10-CM

## 2011-08-19 DIAGNOSIS — M25569 Pain in unspecified knee: Secondary | ICD-10-CM | POA: Insufficient documentation

## 2011-08-19 DIAGNOSIS — M79641 Pain in right hand: Secondary | ICD-10-CM

## 2011-08-19 DIAGNOSIS — M79609 Pain in unspecified limb: Secondary | ICD-10-CM | POA: Insufficient documentation

## 2011-08-19 DIAGNOSIS — M79642 Pain in left hand: Secondary | ICD-10-CM
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
## 2011-08-19 NOTE — Patient Instructions (Signed)
I have ordered x-rays for your back, and her knees, and your hands.  I will see you back in one month to review your x-rays. I will also reviewed some treatment options for you for these areas.  Please make sure you keep your appointment with your gynecology is for your pelvic pain as you have indicated
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
## 2011-08-23 ENCOUNTER — Encounter: Payer: Self-pay | Admitting: Urgent Care

## 2011-08-23 ENCOUNTER — Ambulatory Visit (INDEPENDENT_AMBULATORY_CARE_PROVIDER_SITE_OTHER): Payer: Medicaid Other | Admitting: Urgent Care

## 2011-08-23 DIAGNOSIS — R140 Abdominal distension (gaseous): Secondary | ICD-10-CM

## 2011-08-23 DIAGNOSIS — R1310 Dysphagia, unspecified: Secondary | ICD-10-CM

## 2011-08-23 DIAGNOSIS — R141 Gas pain: Secondary | ICD-10-CM

## 2011-08-23 MED ORDER — PANTOPRAZOLE SODIUM 40 MG PO TBEC: 40.0000 mg | DELAYED_RELEASE_TABLET | Freq: Every day | ORAL | Status: DC

## 2011-08-23 NOTE — Patient Instructions (Signed)
You need an upper endoscopy (EGD) with possible dilation of your esophagus with Dr. Jena Gauss Stop ranitidine Start Protonix 40 mg 30 minutes before breakfast 1-800-quit-now for help quitting smoking Recommend 1-2# weight loss per week until ideal body weight through exercise & diet. Low fat/cholesterol diet. Gradually increase exercise from 15 min daily up to 1 hr per day 5 days/week. Limit alcohol use.     Bloating Bloating is the feeling of fullness in your belly. You may feel as though your pants are too tight. Often the cause of bloating is overeating, retaining fluids, or having gas in your bowel. It is also caused by swallowing air and eating foods that cause gas. Irritable bowel syndrome is one of the most common causes of bloating. Constipation is also a common cause. Sometimes more serious problems can cause bloating. SYMPTOMS  Usually there is a feeling of fullness, as though your abdomen is bulged out. There may be mild discomfort.  DIAGNOSIS  Usually no particular testing is necessary for most bloating. If the condition persists and seems to become worse, your caregiver may do additional testing.  TREATMENT   There is no direct treatment for bloating.   Do not put gas into the bowel. Avoid chewing gum and sucking on candy. These tend to make you swallow air. Swallowing air can also be a nervous habit. Try to avoid this.   Avoiding high residue diets will help. Eat foods with soluble fibers (examples include root vegetables, apples, or barley) and substitute dairy products with soy and rice products. This helps irritable bowel syndrome.   If constipation is the cause, then a high residue diet with more fiber will help.   Avoid carbonated beverages.   Over-the-counter preparations are available that help reduce gas. Your pharmacist can help you with this.  SEEK MEDICAL CARE IF:   Bloating continues and seems to be getting worse.   You notice a weight gain.   You have a  weight loss but the bloating is getting worse.   You have changes in your bowel habits or develop nausea or vomiting.  SEEK IMMEDIATE MEDICAL CARE IF:   You develop shortness of breath or swelling in your legs.   You have an increase in abdominal pain or develop chest pain.  Document Released: 01/19/2006 Document Revised: 03/10/2011 Document Reviewed: 03/09/2007 [facility] Patient Information 2012 [place], [place].
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
## 2011-08-25 ENCOUNTER — Encounter: Payer: Self-pay | Admitting: Urgent Care

## 2011-08-25 NOTE — Assessment & Plan Note (Addendum)
EGD with possible esophageal dilation with Dr. Jena Gauss. Phenergan 12.5mg  IV 30 min prior to procedure.  I have discussed risks & benefits which include, but are not limited to, bleeding, infection, perforation & drug reaction.  The patient agrees with this plan & written consent will be obtained.    Stop ranitidine Start Protonix 40 mg 30 minutes before breakfast 1-800-quit-now for help quitting smoking

## 2011-08-25 NOTE — Assessment & Plan Note (Signed)
Recommend 1-2# weight loss per week until ideal body weight through exercise & diet. Low fat/cholesterol diet. Gradually increase exercise from 15 min daily up to 1 hr per day 5 days/week. Limit alcohol use.

## 2011-08-26 ENCOUNTER — Ambulatory Visit (HOSPITAL_COMMUNITY)
Admission: RE | Admit: 2011-08-26 | Discharge: 2011-08-26 | Disposition: A | Discharge status: Home / Self Care | Payer: Medicaid Other | Source: Ambulatory Visit | Attending: Physical Medicine and Rehabilitation | Admitting: Physical Medicine and Rehabilitation

## 2011-08-26 ENCOUNTER — Other Ambulatory Visit: Payer: Self-pay | Admitting: Physical Medicine and Rehabilitation

## 2011-08-26 DIAGNOSIS — M79641 Pain in right hand: Secondary | ICD-10-CM

## 2011-08-26 DIAGNOSIS — M79642 Pain in left hand: Secondary | ICD-10-CM

## 2011-08-26 DIAGNOSIS — R52 Pain, unspecified: Secondary | ICD-10-CM

## 2011-08-26 DIAGNOSIS — M545 Low back pain: Secondary | ICD-10-CM

## 2011-08-26 DIAGNOSIS — M25562 Pain in left knee: Secondary | ICD-10-CM

## 2011-08-26 DIAGNOSIS — Z9181 History of falling: Secondary | ICD-10-CM | POA: Insufficient documentation

## 2011-08-26 DIAGNOSIS — M25561 Pain in right knee: Secondary | ICD-10-CM

## 2011-08-26 DIAGNOSIS — S68118A Complete traumatic metacarpophalangeal amputation of other finger, initial encounter: Secondary | ICD-10-CM | POA: Insufficient documentation

## 2011-08-26 DIAGNOSIS — M25569 Pain in unspecified knee: Secondary | ICD-10-CM | POA: Insufficient documentation

## 2011-08-26 DIAGNOSIS — M898X9 Other specified disorders of bone, unspecified site: Secondary | ICD-10-CM | POA: Insufficient documentation

## 2011-08-26 DIAGNOSIS — M79609 Pain in unspecified limb: Secondary | ICD-10-CM | POA: Insufficient documentation

## 2011-08-26 NOTE — Progress Notes (Signed)
Faxed to PCP

## 2011-08-31 ENCOUNTER — Encounter: Payer: Self-pay | Admitting: Cardiology

## 2011-08-31 ENCOUNTER — Telehealth: Payer: Self-pay | Admitting: Cardiology

## 2011-08-31 NOTE — Telephone Encounter (Signed)
opened encounter in error

## 2011-09-09 ENCOUNTER — Encounter (HOSPITAL_COMMUNITY): Payer: Self-pay | Admitting: Pharmacy Technician

## 2011-09-12 ENCOUNTER — Encounter: Payer: Medicaid Other | Admitting: Physical Medicine and Rehabilitation

## 2011-09-13 MED ORDER — SODIUM CHLORIDE 0.45 % IV SOLN
Freq: Once | INTRAVENOUS | Status: AC
  Administered 2011-09-14: 13:00:00 via INTRAVENOUS

## 2011-09-14 ENCOUNTER — Ambulatory Visit (HOSPITAL_COMMUNITY)
Admission: RE | Admit: 2011-09-14 | Discharge: 2011-09-14 | Disposition: A | Discharge status: Home / Self Care | Payer: Medicaid Other | Source: Ambulatory Visit | Attending: Internal Medicine | Admitting: Internal Medicine

## 2011-09-14 ENCOUNTER — Encounter (HOSPITAL_COMMUNITY)
Admission: RE | Disposition: A | Discharge status: Home / Self Care | Payer: Self-pay | Source: Ambulatory Visit | Attending: Internal Medicine

## 2011-09-14 ENCOUNTER — Encounter (HOSPITAL_COMMUNITY): Payer: Self-pay | Admitting: *Deleted

## 2011-09-14 DIAGNOSIS — R1310 Dysphagia, unspecified: Secondary | ICD-10-CM

## 2011-09-14 DIAGNOSIS — K2100 Gastro-esophageal reflux disease with esophagitis, without bleeding: Secondary | ICD-10-CM | POA: Insufficient documentation

## 2011-09-14 DIAGNOSIS — R140 Abdominal distension (gaseous): Secondary | ICD-10-CM

## 2011-09-14 DIAGNOSIS — R933 Abnormal findings on diagnostic imaging of other parts of digestive tract: Secondary | ICD-10-CM

## 2011-09-14 DIAGNOSIS — K449 Diaphragmatic hernia without obstruction or gangrene: Secondary | ICD-10-CM | POA: Insufficient documentation

## 2011-09-14 DIAGNOSIS — K228 Other specified diseases of esophagus: Secondary | ICD-10-CM

## 2011-09-14 HISTORY — PX: ESOPHAGOGASTRODUODENOSCOPY: SHX1529

## 2011-09-14 SURGERY — ESOPHAGOGASTRODUODENOSCOPY (EGD) WITH ESOPHAGEAL DILATION
Anesthesia: Moderate Sedation

## 2011-09-14 MED ORDER — MEPERIDINE HCL 100 MG/ML IJ SOLN
INTRAMUSCULAR | Status: DC | PRN
  Administered 2011-09-14: 50 mg via INTRAVENOUS
  Administered 2011-09-14: 25 mg via INTRAVENOUS

## 2011-09-14 MED ORDER — STERILE WATER FOR IRRIGATION IR SOLN
Status: DC | PRN
  Administered 2011-09-14: 14:00:00

## 2011-09-14 MED ORDER — PROMETHAZINE HCL 25 MG/ML IJ SOLN
12.5000 mg | Freq: Once | INTRAMUSCULAR | Status: AC
  Administered 2011-09-14: 12.5 mg via INTRAVENOUS

## 2011-09-14 MED ORDER — MEPERIDINE HCL 100 MG/ML IJ SOLN
INTRAMUSCULAR | Status: AC
  Filled 2011-09-14: qty 2

## 2011-09-14 MED ORDER — SODIUM CHLORIDE 0.9 % IJ SOLN
INTRAMUSCULAR | Status: AC
  Filled 2011-09-14: qty 10

## 2011-09-14 MED ORDER — PROMETHAZINE HCL 25 MG/ML IJ SOLN
INTRAMUSCULAR | Status: AC
  Filled 2011-09-14: qty 1

## 2011-09-14 MED ORDER — MIDAZOLAM HCL 5 MG/5ML IJ SOLN
INTRAMUSCULAR | Status: DC | PRN
  Administered 2011-09-14: 1 mg via INTRAVENOUS
  Administered 2011-09-14: 2 mg via INTRAVENOUS

## 2011-09-14 MED ORDER — MIDAZOLAM HCL 5 MG/5ML IJ SOLN
INTRAMUSCULAR | Status: AC
  Filled 2011-09-14: qty 10

## 2011-09-14 NOTE — Discharge Instructions (Addendum)
EGD Discharge instructions Please read the instructions outlined below and refer to this sheet in the next few weeks. These discharge instructions provide you with general information on caring for yourself after you leave the hospital. Your doctor may also give you specific instructions. While your treatment has been planned according to the most current medical practices available, unavoidable complications occasionally occur. If you have any problems or questions after discharge, please call your doctor. ACTIVITY  You may resume your regular activity but move at a slower pace for the next 24 hours.   Take frequent rest periods for the next 24 hours.   Walking will help expel (get rid of) the air and reduce the bloated feeling in your abdomen.   No driving for 24 hours (because of the anesthesia (medicine) used during the test).   You may shower.   Do not sign any important legal documents or operate any machinery for 24 hours (because of the anesthesia used during the test).  NUTRITION  Drink plenty of fluids.   You may resume your normal diet.   Begin with a light meal and progress to your normal diet.   Avoid alcoholic beverages for 24 hours or as instructed by your caregiver.  MEDICATIONS  You may resume your normal medications unless your caregiver tells you otherwise.  WHAT YOU CAN EXPECT TODAY  You may experience abdominal discomfort such as a feeling of fullness or "gas" pains.  FOLLOW-UP  Your doctor will discuss the results of your test with you.  SEEK IMMEDIATE MEDICAL ATTENTION IF ANY OF THE FOLLOWING OCCUR:  Excessive nausea (feeling sick to your stomach) and/or vomiting.   Severe abdominal pain and distention (swelling).   Trouble swallowing.   Temperature over 101 F (37.8 C).   Rectal bleeding or vomiting of blood.    GERD information provided.  Continue Protonix 40 mg orally daily.  Office visit with [place] in 3 months  Endoscopy Care  After Please read the instructions outlined below and refer to this sheet in the next few weeks. These discharge instructions provide you with general information on caring for yourself after you leave the hospital. Your doctor may also give you specific instructions. While your treatment has been planned according to the most current medical practices available, unavoidable complications occasionally occur. If you have any problems or questions after discharge, please call your doctor. HOME CARE INSTRUCTIONS Activity  You may resume your regular activity but move at a slower pace for the next 24 hours.   Take frequent rest periods for the next 24 hours.   Walking will help expel (get rid of) the air and reduce the bloated feeling in your abdomen.   No driving for 24 hours (because of the anesthesia (medicine) used during the test).   You may shower.   Do not sign any important legal documents or operate any machinery for 24 hours (because of the anesthesia used during the test).  Nutrition  Drink plenty of fluids.   You may resume your normal diet.   Begin with a light meal and progress to your normal diet.   Avoid alcoholic beverages for 24 hours or as instructed by your caregiver.  Medications You may resume your normal medications unless your caregiver tells you otherwise. What you can expect today  You may experience abdominal discomfort such as a feeling of fullness or "gas" pains.   You may experience a sore throat for 2 to 3 days. This is normal. Gargling with salt  water may help this.  Follow-up Your doctor will discuss the results of your test with you. SEEK IMMEDIATE MEDICAL CARE IF:  You have excessive nausea (feeling sick to your stomach) and/or vomiting.   You have severe abdominal pain and distention (swelling).   You have trouble swallowing.   You have a temperature over 100 F (37.8 C).   You have rectal bleeding or vomiting of blood.  Document Released:  11/03/2003 Document Revised: 03/10/2011 Document Reviewed: 05/16/2007 [facility] Patient Information 2012 [place], [place].  Hiatal Hernia A hiatal hernia occurs when a part of the stomach slides above the diaphragm. The diaphragm is the thin muscle separating the belly (abdomen) from the chest. A hiatal hernia can be something you are born with or develop over time. Hiatal hernias may allow stomach acid to flow back into your esophagus, the tube which carries food from your mouth to your stomach. If this acid causes problems it is called GERD (gastro-esophageal reflux disease).  SYMPTOMS  Common symptoms of GERD are heartburn (burning in your chest). This is worse when lying down or bending over. It may also cause belching and indigestion. Some of the things which make GERD worse are:  Increased weight pushes on stomach making acid rise more easily.   Smoking markedly increases acid production.   Alcohol decreases lower esophageal sphincter pressure (valve between stomach and esophagus), allowing acid from stomach into esophagus.   Late evening meals and going to bed with a full stomach increases pressure.   Anything that causes an increase in acid production.   Lower esophageal sphincter incompetence.  DIAGNOSIS  Hiatal hernia is often diagnosed with x-rays of your stomach and small bowel. This is called an UGI (upper gastrointestinal x-ray). Sometimes a gastroscopic procedure is done. This is a procedure where your caregiver uses a flexible instrument to look into the stomach and small bowel. HOME CARE INSTRUCTIONS   Try to achieve and maintain an ideal body weight.   Avoid drinking alcoholic beverages.   Stop smoking.   Put the head of your bed on 4 to 6 inch blocks. This will keep your head and esophagus higher than your stomach. If you cannot use blocks, sleep with several pillows under your head and shoulders.   Over-the-counter medications will decrease acid production. Your  caregiver can also prescribe medications for this. Take as directed.   1/2 to 1 teaspoon of an antacid taken every hour while awake, with meals and at bedtime, will neutralize acid.   Do not take aspirin, ibuprofen (Advil or Motrin), or other nonsteroidal anti-inflammatory drugs.   Do not wear tight clothing around your chest or stomach.   Eat smaller meals and eat more frequently. This keeps your stomach from getting too full. Eat slowly.   Do not lie down for 2 or 3 hours after eating. Do not eat or drink anything 1 to 2 hours before going to bed.   Avoid caffeine beverages (colas, coffee, cocoa, tea), fatty foods, citrus fruits and all other foods and drinks that contain acid and that seem to increase the problems.   Avoid bending over, especially after eating. Also avoid straining during bowel movements or when urinating or lifting things. Anything that increases the pressure in your belly increases the amount of acid that may be pushed up into your esophagus.  SEEK IMMEDIATE MEDICAL CARE IF:  There is change in location (pain in arms, neck, jaw, teeth or back) of your pain, or the pain is getting worse.  You also experience nausea, vomiting, sweating (diaphoresis), or shortness of breath.   You develop continual vomiting, vomit blood or coffee ground material, have bright red blood in your stools, or have black tarry stools.  Some of these symptoms could signal other problems such as heart disease. MAKE SURE YOU:   Understand these instructions.   Monitor your condition.   Contact your caregiver if you are not doing well or are getting worse.  Document Released: 06/11/2003 Document Revised: 03/10/2011 Document Reviewed: 03/21/2005 [facility] Patient Information 2012 [place], [place].  Gastroesophageal Reflux Disease, Adult Gastroesophageal reflux disease (GERD) happens when acid from your stomach flows up into the esophagus. When acid comes in contact with the esophagus, the  acid causes soreness (inflammation) in the esophagus. Over time, GERD may create small holes (ulcers) in the lining of the esophagus. CAUSES   Increased body weight. This puts pressure on the stomach, making acid rise from the stomach into the esophagus.   Smoking. This increases acid production in the stomach.   Drinking alcohol. This causes decreased pressure in the lower esophageal sphincter (valve or ring of muscle between the esophagus and stomach), allowing acid from the stomach into the esophagus.   Late evening meals and a full stomach. This increases pressure and acid production in the stomach.   A malformed lower esophageal sphincter.  Sometimes, no cause is found. SYMPTOMS   Burning pain in the lower part of the mid-chest behind the breastbone and in the mid-stomach area. This may occur twice a week or more often.   Trouble swallowing.   Sore throat.   Dry cough.   Asthma-like symptoms including chest tightness, shortness of breath, or wheezing.  DIAGNOSIS  Your caregiver may be able to diagnose GERD based on your symptoms. In some cases, X-rays and other tests may be done to check for complications or to check the condition of your stomach and esophagus. TREATMENT  Your caregiver may recommend over-the-counter or prescription medicines to help decrease acid production. Ask your caregiver before starting or adding any new medicines.  HOME CARE INSTRUCTIONS   Change the factors that you can control. Ask your caregiver for guidance concerning weight loss, quitting smoking, and alcohol consumption.   Avoid foods and drinks that make your symptoms worse, such as:   Caffeine or alcoholic drinks.   Chocolate.   Peppermint or mint flavorings.   Garlic and onions.   Spicy foods.   Citrus fruits, such as oranges, lemons, or limes.   Tomato-based foods such as sauce, chili, salsa, and pizza.   Fried and fatty foods.   Avoid lying down for the 3 hours prior to your  bedtime or prior to taking a nap.   Eat small, frequent meals instead of large meals.   Wear loose-fitting clothing. Do not wear anything tight around your waist that causes pressure on your stomach.   Raise the head of your bed 6 to 8 inches with wood blocks to help you sleep. Extra pillows will not help.   Only take over-the-counter or prescription medicines for pain, discomfort, or fever as directed by your caregiver.   Do not take aspirin, ibuprofen, or other nonsteroidal anti-inflammatory drugs (NSAIDs).  SEEK IMMEDIATE MEDICAL CARE IF:   You have pain in your arms, neck, jaw, teeth, or back.   Your pain increases or changes in intensity or duration.   You develop nausea, vomiting, or sweating (diaphoresis).   You develop shortness of breath, or you faint.   Your vomit  is green, yellow, black, or looks like coffee grounds or blood.   Your stool is red, bloody, or black.  These symptoms could be signs of other problems, such as heart disease, gastric bleeding, or esophageal bleeding. MAKE SURE YOU:   Understand these instructions.   Will watch your condition.   Will get help right away if you are not doing well or get worse.  Document Released: 12/29/2004 Document Revised: 03/10/2011 Document Reviewed: 10/08/2010 [facility] Patient Information 2012 [place], [place].

## 2011-09-14 NOTE — Op Note (Addendum)
[facility] [address] [place], [place]  [address]  ENDOSCOPY PROCEDURE REPORT  PATIENT:  Meredith [name], Meredith [name]  MR#:  [id number] BIRTHDATE:  06/03/1965, 46 yrs. old  GENDER:  female  ENDOSCOPIST:  R. Roetta Sessions, MD Caleen Essex Referred by:  [name], M.D.  PROCEDURE DATE:  09/14/2011 PROCEDURE:  EGD with Elease Hashimoto dilation  INDICATIONS:   esophagus dysphagia/thickened esophagus on recent chest CT  INFORMED CONSENT:   The risks, benefits, limitations, alternatives and imponderables have been discussed.  The potential for biopsy, esophogeal dilation, etc. have also been reviewed.  Questions have been answered.  All parties agreeable.  Please see the history and physical in the medical record for more information.  MEDICATIONS:      Phenergan 25 mg IV and Demerol 75 mg IV and Versed 3 mg IV in divided doses. Cetacaine spray.  DESCRIPTION OF PROCEDURE:   The [id number] ([id number]) endoscope was introduced through the mouth and advanced to the second portion of the duodenum without difficulty or limitations.  The mucosal surfaces were surveyed very carefully during advancement of the scope and upon withdrawal.  Retroflexion view of the proximal stomach and esophagogastric junction was performed.  <<PROCEDUREIMAGES>>  FINDINGS:   Couple tiny distal esophageal erosions. No obstructing lesion. No Barrett's esophagus. Stomach empty. Small hiatal hernia.    Patent pylorus. Normal first and second portion of the duodenum.  THERAPEUTIC / DIAGNOSTIC MANEUVERS PERFORMED:   88 French Maloney dilator passed a full insertion easily.  A look back revealed no apparent complications related to passage of the dilator.  COMPLICATIONS:   None  IMPRESSION:   Tiny distal esophageal erosions consistent with mild erosive reflux esophagitis. Small hiatal hernia. Status post Elease Hashimoto dilation as described above.  RECOMMENDATIONS:   Continue Protonix 40 mg orally daily. Reviewed antireflux  measures/lifestyle/diet.  Office visit with [place] in 3 months.  ______________________________ R. Roetta Sessions, MD Caleen Essex  CC:  n. eSIGNED:   R. Roetta Sessions at 09/14/2011 02:20 PM  Kennyth Arnold, [phone]

## 2011-09-15 ENCOUNTER — Encounter: Payer: Self-pay | Admitting: Physical Medicine and Rehabilitation

## 2011-09-15 ENCOUNTER — Encounter
Payer: Medicaid Other | Attending: Physical Medicine and Rehabilitation | Admitting: Physical Medicine and Rehabilitation

## 2011-09-15 VITALS — BP 119/80 | HR 88 | Resp 14 | Ht 66.0 in | Wt 163.0 lb

## 2011-09-15 DIAGNOSIS — G8929 Other chronic pain: Secondary | ICD-10-CM | POA: Insufficient documentation

## 2011-09-15 DIAGNOSIS — R062 Wheezing: Secondary | ICD-10-CM | POA: Insufficient documentation

## 2011-09-15 DIAGNOSIS — R5383 Other fatigue: Secondary | ICD-10-CM | POA: Insufficient documentation

## 2011-09-15 DIAGNOSIS — R29898 Other symptoms and signs involving the musculoskeletal system: Secondary | ICD-10-CM | POA: Insufficient documentation

## 2011-09-15 DIAGNOSIS — M25561 Pain in right knee: Secondary | ICD-10-CM

## 2011-09-15 DIAGNOSIS — M79609 Pain in unspecified limb: Secondary | ICD-10-CM | POA: Insufficient documentation

## 2011-09-15 DIAGNOSIS — R109 Unspecified abdominal pain: Secondary | ICD-10-CM | POA: Insufficient documentation

## 2011-09-15 DIAGNOSIS — M545 Low back pain, unspecified: Secondary | ICD-10-CM

## 2011-09-15 DIAGNOSIS — M25562 Pain in left knee: Secondary | ICD-10-CM

## 2011-09-15 DIAGNOSIS — M79643 Pain in unspecified hand: Secondary | ICD-10-CM

## 2011-09-15 DIAGNOSIS — R5381 Other malaise: Secondary | ICD-10-CM | POA: Insufficient documentation

## 2011-09-15 DIAGNOSIS — M25569 Pain in unspecified knee: Secondary | ICD-10-CM | POA: Insufficient documentation

## 2011-09-15 DIAGNOSIS — R209 Unspecified disturbances of skin sensation: Secondary | ICD-10-CM | POA: Insufficient documentation

## 2011-09-15 NOTE — Patient Instructions (Signed)
Advised patient to work out in the pool. Advised patient to start PT.
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
## 2011-10-17 ENCOUNTER — Encounter
Payer: Medicaid Other | Attending: Physical Medicine and Rehabilitation | Admitting: Physical Medicine and Rehabilitation

## 2011-10-17 ENCOUNTER — Encounter: Payer: Self-pay | Admitting: Physical Medicine and Rehabilitation

## 2011-10-17 VITALS — BP 137/61 | HR 98 | Resp 14 | Ht 66.0 in | Wt 177.0 lb

## 2011-10-17 DIAGNOSIS — M79641 Pain in right hand: Secondary | ICD-10-CM

## 2011-10-17 DIAGNOSIS — M25569 Pain in unspecified knee: Secondary | ICD-10-CM

## 2011-10-17 DIAGNOSIS — N393 Stress incontinence (female) (male): Secondary | ICD-10-CM

## 2011-10-17 DIAGNOSIS — R5382 Chronic fatigue, unspecified: Secondary | ICD-10-CM | POA: Insufficient documentation

## 2011-10-17 DIAGNOSIS — M25562 Pain in left knee: Secondary | ICD-10-CM

## 2011-10-17 DIAGNOSIS — M545 Low back pain, unspecified: Secondary | ICD-10-CM

## 2011-10-17 DIAGNOSIS — M79642 Pain in left hand: Secondary | ICD-10-CM

## 2011-10-17 DIAGNOSIS — M25539 Pain in unspecified wrist: Secondary | ICD-10-CM | POA: Insufficient documentation

## 2011-10-17 DIAGNOSIS — K219 Gastro-esophageal reflux disease without esophagitis: Secondary | ICD-10-CM | POA: Insufficient documentation

## 2011-10-17 DIAGNOSIS — G9332 Myalgic encephalomyelitis/chronic fatigue syndrome: Secondary | ICD-10-CM | POA: Insufficient documentation

## 2011-10-17 DIAGNOSIS — M129 Arthropathy, unspecified: Secondary | ICD-10-CM | POA: Insufficient documentation

## 2011-10-17 DIAGNOSIS — M25561 Pain in right knee: Secondary | ICD-10-CM

## 2011-10-17 DIAGNOSIS — IMO0001 Reserved for inherently not codable concepts without codable children: Secondary | ICD-10-CM | POA: Insufficient documentation

## 2011-10-17 DIAGNOSIS — G8929 Other chronic pain: Secondary | ICD-10-CM | POA: Insufficient documentation

## 2011-10-17 DIAGNOSIS — F17200 Nicotine dependence, unspecified, uncomplicated: Secondary | ICD-10-CM | POA: Insufficient documentation

## 2011-10-17 DIAGNOSIS — M79609 Pain in unspecified limb: Secondary | ICD-10-CM
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
## 2011-10-17 NOTE — Patient Instructions (Signed)
Start with physical therapy, try to stay active.
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
## 2011-11-08 ENCOUNTER — Observation Stay (HOSPITAL_COMMUNITY)
Admission: EM | Admit: 2011-11-08 | Discharge: 2011-11-09 | Disposition: A | Discharge status: Home / Self Care | Payer: Medicaid Other | Attending: Internal Medicine | Admitting: Internal Medicine

## 2011-11-08 ENCOUNTER — Emergency Department (HOSPITAL_COMMUNITY): Payer: Medicaid Other

## 2011-11-08 ENCOUNTER — Encounter (HOSPITAL_COMMUNITY): Payer: Self-pay | Admitting: Emergency Medicine

## 2011-11-08 DIAGNOSIS — R109 Unspecified abdominal pain: Secondary | ICD-10-CM

## 2011-11-08 DIAGNOSIS — R140 Abdominal distension (gaseous): Secondary | ICD-10-CM

## 2011-11-08 DIAGNOSIS — Z720 Tobacco use: Secondary | ICD-10-CM

## 2011-11-08 DIAGNOSIS — K209 Esophagitis, unspecified without bleeding: Secondary | ICD-10-CM | POA: Insufficient documentation

## 2011-11-08 DIAGNOSIS — R110 Nausea: Secondary | ICD-10-CM | POA: Insufficient documentation

## 2011-11-08 DIAGNOSIS — R079 Chest pain, unspecified: Secondary | ICD-10-CM | POA: Insufficient documentation

## 2011-11-08 DIAGNOSIS — R0600 Dyspnea, unspecified: Secondary | ICD-10-CM

## 2011-11-08 DIAGNOSIS — R002 Palpitations: Secondary | ICD-10-CM

## 2011-11-08 DIAGNOSIS — R030 Elevated blood-pressure reading, without diagnosis of hypertension: Secondary | ICD-10-CM

## 2011-11-08 DIAGNOSIS — R1011 Right upper quadrant pain: Principal | ICD-10-CM | POA: Insufficient documentation

## 2011-11-08 DIAGNOSIS — F17200 Nicotine dependence, unspecified, uncomplicated: Secondary | ICD-10-CM | POA: Insufficient documentation

## 2011-11-08 LAB — URINE MICROSCOPIC-ADD ON

## 2011-11-08 LAB — CBC WITH DIFFERENTIAL/PLATELET
Basophils Absolute: 0 10*3/uL (ref 0.0–0.1)
Basophils Relative: 0 % (ref 0–1)
Eosinophils Absolute: 0.3 10*3/uL (ref 0.0–0.7)
Eosinophils Relative: 4 % (ref 0–5)
HCT: 43.1 % (ref 36.0–46.0)
MCHC: 34.3 g/dL (ref 30.0–36.0)
MCV: 100.2 fL — ABNORMAL HIGH (ref 78.0–100.0)
Monocytes Absolute: 0.6 10*3/uL (ref 0.1–1.0)
RDW: 14.6 % (ref 11.5–15.5)

## 2011-11-08 LAB — URINALYSIS, ROUTINE W REFLEX MICROSCOPIC
Glucose, UA: NEGATIVE mg/dL
Leukocytes, UA: NEGATIVE
Protein, ur: NEGATIVE mg/dL
pH: 5.5 (ref 5.0–8.0)

## 2011-11-08 LAB — COMPREHENSIVE METABOLIC PANEL
AST: 16 U/L (ref 0–37)
Albumin: 3.5 g/dL (ref 3.5–5.2)
Calcium: 9.3 mg/dL (ref 8.4–10.5)
Creatinine, Ser: 0.79 mg/dL (ref 0.50–1.10)
GFR calc non Af Amer: >90 mL/min (ref >90)
Total Protein: 6.3 g/dL (ref 6.0–8.3)

## 2011-11-08 LAB — RAPID URINE DRUG SCREEN, HOSP PERFORMED
Barbiturates: NOT DETECTED
Cocaine: NOT DETECTED

## 2011-11-08 LAB — TROPONIN I: Troponin I: <0.3 ng/mL (ref <0.30)

## 2011-11-08 LAB — ETHANOL: Alcohol, Ethyl (B): <11 mg/dL (ref 0–11)

## 2011-11-08 MED ORDER — SODIUM CHLORIDE 0.9 % IV BOLUS (SEPSIS)
1000.0000 mL | Freq: Once | INTRAVENOUS | Status: AC
  Administered 2011-11-08: 1000 mL via INTRAVENOUS

## 2011-11-08 MED ORDER — MORPHINE SULFATE 4 MG/ML IJ SOLN
4.0000 mg | Freq: Once | INTRAMUSCULAR | Status: AC
  Administered 2011-11-08: 4 mg via INTRAVENOUS
  Filled 2011-11-08: qty 1

## 2011-11-08 MED ORDER — ONDANSETRON HCL 4 MG/2ML IJ SOLN
4.0000 mg | Freq: Once | INTRAMUSCULAR | Status: AC
  Administered 2011-11-08: 4 mg via INTRAVENOUS
  Filled 2011-11-08: qty 2
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
## 2011-11-08 NOTE — ED Notes (Signed)
Patient complaining of right upper abdominal pain x 3 weeks with intermittent sharp pains. Patient also reports is coughing up phlegm. Patient reports a few years ago was supposed to follow up for ultrasound on gallbladder, but did not follow up.

## 2011-11-09 ENCOUNTER — Encounter (HOSPITAL_COMMUNITY): Payer: Self-pay | Admitting: Radiology

## 2011-11-09 ENCOUNTER — Observation Stay (HOSPITAL_COMMUNITY): Payer: Medicaid Other

## 2011-11-09 DIAGNOSIS — R109 Unspecified abdominal pain: Secondary | ICD-10-CM

## 2011-11-09 DIAGNOSIS — R079 Chest pain, unspecified: Secondary | ICD-10-CM

## 2011-11-09 DIAGNOSIS — R141 Gas pain: Secondary | ICD-10-CM

## 2011-11-09 LAB — CARDIAC PANEL(CRET KIN+CKTOT+MB+TROPI)
CK, MB: 2.9 ng/mL (ref 0.3–4.0)
Relative Index: 2.9 — ABNORMAL HIGH (ref 0.0–2.5)
Total CK: 98 U/L (ref 7–177)
Troponin I: <0.3 ng/mL (ref <0.30)
Troponin I: <0.3 ng/mL (ref <0.30)

## 2011-11-09 LAB — TSH: TSH: 2.5 u[IU]/mL (ref 0.350–4.500)

## 2011-11-09 MED ORDER — BUPROPION HCL ER (XL) 300 MG PO TB24
300.0000 mg | ORAL_TABLET | Freq: Every day | ORAL | Status: DC
  Filled 2011-11-09 (×3): qty 1

## 2011-11-09 MED ORDER — IOHEXOL 300 MG/ML  SOLN
100.0000 mL | Freq: Once | INTRAMUSCULAR | Status: AC | PRN
  Administered 2011-11-09: 100 mL via INTRAVENOUS

## 2011-11-09 MED ORDER — ALBUTEROL SULFATE HFA 108 (90 BASE) MCG/ACT IN AERS: 2.0000 | INHALATION_SPRAY | RESPIRATORY_TRACT | Status: DC | PRN

## 2011-11-09 MED ORDER — ROPINIROLE HCL 1 MG PO TABS
ORAL_TABLET | ORAL | Status: AC
  Filled 2011-11-09: qty 1

## 2011-11-09 MED ORDER — GABAPENTIN 400 MG PO CAPS
800.0000 mg | ORAL_CAPSULE | Freq: Every day | ORAL | Status: DC
  Administered 2011-11-09: 800 mg via ORAL
  Filled 2011-11-09: qty 2

## 2011-11-09 MED ORDER — PANTOPRAZOLE SODIUM 40 MG PO TBEC
40.0000 mg | DELAYED_RELEASE_TABLET | Freq: Two times a day (BID) | ORAL | Status: DC
  Administered 2011-11-09: 40 mg via ORAL
  Filled 2011-11-09: qty 1

## 2011-11-09 MED ORDER — CHLORPROMAZINE HCL 100 MG PO TABS
100.0000 mg | ORAL_TABLET | Freq: Every day | ORAL | Status: DC
  Administered 2011-11-09: 100 mg via ORAL
  Filled 2011-11-09: qty 1

## 2011-11-09 MED ORDER — ACETAMINOPHEN 325 MG PO TABS: 650.0000 mg | ORAL_TABLET | Freq: Four times a day (QID) | ORAL | Status: DC | PRN

## 2011-11-09 MED ORDER — ONDANSETRON HCL 4 MG/2ML IJ SOLN: 4.0000 mg | Freq: Four times a day (QID) | INTRAMUSCULAR | Status: DC | PRN

## 2011-11-09 MED ORDER — POLYETHYLENE GLYCOL 3350 17 G PO PACK
17.0000 g | PACK | Freq: Every day | ORAL | Status: DC
  Administered 2011-11-09: 17 g via ORAL
  Filled 2011-11-09: qty 1

## 2011-11-09 MED ORDER — MORPHINE SULFATE 2 MG/ML IJ SOLN
2.0000 mg | INTRAMUSCULAR | Status: DC | PRN
  Administered 2011-11-09 (×3): 2 mg via INTRAVENOUS
  Filled 2011-11-09 (×3): qty 1

## 2011-11-09 MED ORDER — DOXEPIN HCL 25 MG PO CAPS: 50.0000 mg | ORAL_CAPSULE | Freq: Every day | ORAL | Status: DC

## 2011-11-09 MED ORDER — VILAZODONE HCL 40 MG PO TABS: 40.0000 mg | ORAL_TABLET | Freq: Every day | ORAL | Status: DC

## 2011-11-09 MED ORDER — DOXEPIN HCL 25 MG PO CAPS
50.0000 mg | ORAL_CAPSULE | Freq: Every day | ORAL | Status: DC
  Administered 2011-11-09: 50 mg via ORAL
  Filled 2011-11-09: qty 2

## 2011-11-09 MED ORDER — TRAMADOL HCL 50 MG PO TABS: 50.0000 mg | ORAL_TABLET | Freq: Four times a day (QID) | ORAL | Status: DC | PRN

## 2011-11-09 MED ORDER — ONDANSETRON HCL 4 MG PO TABS: 4.0000 mg | ORAL_TABLET | Freq: Four times a day (QID) | ORAL | Status: DC | PRN

## 2011-11-09 MED ORDER — SODIUM CHLORIDE 0.9 % IJ SOLN: 3.0000 mL | Freq: Two times a day (BID) | INTRAMUSCULAR | Status: DC

## 2011-11-09 MED ORDER — NICOTINE 21 MG/24HR TD PT24
21.0000 mg | MEDICATED_PATCH | Freq: Every day | TRANSDERMAL | Status: DC
  Filled 2011-11-09: qty 1

## 2011-11-09 MED ORDER — ROPINIROLE HCL 0.5 MG PO TABS: 0.5000 mg | ORAL_TABLET | Freq: Every day | ORAL | Status: DC

## 2011-11-09 MED ORDER — PREGABALIN 75 MG PO CAPS
75.0000 mg | ORAL_CAPSULE | Freq: Two times a day (BID) | ORAL | Status: DC
  Administered 2011-11-09: 75 mg via ORAL
  Filled 2011-11-09: qty 1

## 2011-11-09 MED ORDER — DOCUSATE SODIUM 100 MG PO CAPS
100.0000 mg | ORAL_CAPSULE | Freq: Two times a day (BID) | ORAL | Status: DC
  Administered 2011-11-09: 100 mg via ORAL
  Filled 2011-11-09: qty 1

## 2011-11-09 MED ORDER — ACETAMINOPHEN 650 MG RE SUPP: 650.0000 mg | Freq: Four times a day (QID) | RECTAL | Status: DC | PRN

## 2011-11-09 MED ORDER — GI COCKTAIL ~~LOC~~
30.0000 mL | Freq: Once | ORAL | Status: AC
  Administered 2011-11-09: 30 mL via ORAL
  Filled 2011-11-09: qty 30

## 2011-11-09 MED ORDER — PANTOPRAZOLE SODIUM 40 MG PO TBEC: 40.0000 mg | DELAYED_RELEASE_TABLET | ORAL | Status: DC

## 2011-11-09 MED ORDER — TRAZODONE HCL 50 MG PO TABS
150.0000 mg | ORAL_TABLET | Freq: Every day | ORAL | Status: DC
  Administered 2011-11-09: 150 mg via ORAL
  Filled 2011-11-09: qty 3

## 2011-11-09 MED ORDER — TRAZODONE HCL 50 MG PO TABS: 150.0000 mg | ORAL_TABLET | Freq: Every day | ORAL | Status: DC

## 2011-11-09 MED ORDER — GABAPENTIN 400 MG PO CAPS: 800.0000 mg | ORAL_CAPSULE | Freq: Every day | ORAL | Status: DC

## 2011-11-09 MED ORDER — ALPRAZOLAM 1 MG PO TABS
1.0000 mg | ORAL_TABLET | Freq: Three times a day (TID) | ORAL | Status: DC
Start: 2011-11-09 — End: 2011-11-09
  Administered 2011-11-09: 1 mg via ORAL
  Filled 2011-11-09: qty 1

## 2011-11-09 MED ORDER — OXYCODONE HCL 5 MG PO TABS
5.0000 mg | ORAL_TABLET | ORAL | Status: DC | PRN
  Administered 2011-11-09 (×2): 5 mg via ORAL
  Filled 2011-11-09 (×2): qty 1

## 2011-11-09 MED ORDER — BUPROPION HCL ER (SR) 150 MG PO TB12
150.0000 mg | ORAL_TABLET | Freq: Every day | ORAL | Status: DC
  Filled 2011-11-09 (×3): qty 1

## 2011-11-09 MED ORDER — ASPIRIN EC 81 MG PO TBEC
81.0000 mg | DELAYED_RELEASE_TABLET | Freq: Every day | ORAL | Status: DC
  Administered 2011-11-09: 81 mg via ORAL
  Filled 2011-11-09: qty 1

## 2011-11-09 MED ORDER — ROPINIROLE HCL 0.25 MG PO TABS
0.5000 mg | ORAL_TABLET | Freq: Every day | ORAL | Status: DC
  Administered 2011-11-09: 0.5 mg via ORAL
  Filled 2011-11-09 (×2): qty 2
  Filled 2011-11-09: qty 1

## 2011-11-09 MED ORDER — ARIPIPRAZOLE 10 MG PO TABS
5.0000 mg | ORAL_TABLET | Freq: Every day | ORAL | Status: DC
  Administered 2011-11-09: 5 mg via ORAL
  Filled 2011-11-09: qty 1

## 2011-11-09 MED ORDER — FLUTICASONE PROPIONATE HFA 44 MCG/ACT IN AERO
2.0000 | INHALATION_SPRAY | Freq: Two times a day (BID) | RESPIRATORY_TRACT | Status: DC
  Administered 2011-11-09: 2 via RESPIRATORY_TRACT
  Filled 2011-11-09: qty 10.6

## 2011-11-09 MED ORDER — SODIUM CHLORIDE 0.9 % IJ SOLN
INTRAMUSCULAR | Status: AC
  Filled 2011-11-09: qty 3

## 2011-11-09 MED ORDER — ALUM & MAG HYDROXIDE-SIMETH 200-200-20 MG/5ML PO SUSP: 30.0000 mL | Freq: Four times a day (QID) | ORAL | Status: DC | PRN

## 2011-11-09 NOTE — Discharge Summary (Signed)
Physician Discharge Summary  Meredith [name]:[id number] DOB: 06-27-1965 DOA: 11/08/2011  PCP: [name], NP  Admit date: 11/08/2011 Discharge date: 11/09/2011  Recommendations for Outpatient Follow-up:  1. Follow with gastroenterology, Dr. Kendell Bane.   Discharge Diagnoses:  1. Right upper quadrant abdominal pain, unclear etiology. No evidence of myocardial ischemia or infarction. Ultrasound of the abdomen negative for cholelithiasis. There is fusiform dilatation of the mid common duct measuring 8.5 mm. 2. Mild esophagitis in the lower esophagus. 3. Tobacco abuse.   Discharge Condition: Stable.  Diet recommendation: Regular.  Wt Readings from Last 3 Encounters:  11/09/11 83.507 kg (184 lb 1.6 oz)  10/17/11 80.287 kg (177 lb)  09/15/11 73.936 kg (163 lb)    History of present illness:  This 46 year old lady was admitted with symptoms of chest pain and right upper quadrant abdominal pain. Please see initial history and physical examination by Dr. Phillips Odor. The chest pain she initially described as intermittent and like "ton of bricks on my chest". This pain lasted approximately 10 minutes and resolved on its own. She apparently had had 3-4 episodes of this and that is when she came into the emergency room. In addition, she also had right upper quadrant abdominal pain. When I saw her this morning, the right upper quadrant abdominal pain was the main issue. Her chest pain apparently had resolved.  Hospital Course:  She was admitted to the hospital and serial cardiac enzymes were all negative. In view of the right upper quadrant abdominal pain, despite normal liver enzymes, especially alkaline phosphatase, an ultrasound of the abdomen was done. This was negative for cholelithiasis. She is medically stable for discharge to  Procedures:  None.  Consultations:  None.  Discharge Exam: Filed Vitals:   11/09/11 1330  BP: 122/76  Pulse: 96  Temp: 97.3 F (36.3 C)  Resp: 18   Filed  Vitals:   11/09/11 0212 11/09/11 0451 11/09/11 0740 11/09/11 1330  BP: 125/87 134/93  122/76  Pulse: 72 67  96  Temp: 97.8 F (36.6 C) 97.6 F (36.4 C)  97.3 F (36.3 C)  TempSrc: Oral Oral    Resp: 18 18  18   Height: 5\' 5"  (1.651 m)     Weight: 83.507 kg (184 lb 1.6 oz)     SpO2: 97% 93% 95% 96%    General: Looks systemically well. Not toxic or septic. Cardiovascular: Heart sounds are present and normal without murmurs. Respiratory: Lung fields are clear. Abdomen is soft and nonspecifically tender in a generalized fashion. She does not appear to have an acute abdomen. She is alert and orientated without any focal neural signs.  Discharge Instructions  Discharge Orders    Future Appointments: Provider: Department: Dept Phone: Center:   11/16/2011 2:00 PM Su Monks, PA-C [facility] [phone] [facility]   12/15/2011 10:30 AM Tiffany Kocher, PA [facility] [phone] [facility]     Future Orders Please Complete By Expires   US Abdomen Complete   01/07/13   Scheduling Instructions:   IMPORTANT PATIENT INSTRUCTIONS:  You have been scheduled for an Outpatient Ultrasound.    Your appointment has been scheduled for:  _______ am/pm on _______________ (date).  If your appointment is scheduled for a Saturday or Sunday, please go to the [facility] Emergency Department Registration Desk at least 15 minutes prior to your appointment time and tell them you are there for an ultrasound.    If your appointment is scheduled for a weekday (Monday-Friday), please go directly to the  [facility] Radiology Department at least 15 minutes prior to your appointment time and tell them you are there for an ultrasound.  Please call [phone] with questions.   Questions: Responses:   Preferred imaging location? [facility]   Reason for exam: RUQ pain   Diet - low sodium heart healthy      Increase activity slowly        Medication List  As of 11/09/2011  1:54 PM   STOP taking these  medications         celecoxib 200 MG capsule         TAKE these medications         albuterol-ipratropium 18-103 MCG/ACT inhaler   Commonly known as: COMBIVENT   Inhale 2 puffs into the lungs every 6 (six) hours as needed. For wheezing      ALPRAZolam 1 MG tablet   Commonly known as: XANAX   Take 1 mg by mouth 3 (three) times daily.      ARIPiprazole 5 MG tablet   Commonly known as: ABILIFY   Take 5 mg by mouth every morning.      beclomethasone 80 MCG/ACT inhaler   Commonly known as: QVAR   Inhale 1 puff into the lungs 2 (two) times daily.      buPROPion 300 MG 24 hr tablet   Commonly known as: WELLBUTRIN XL   Take 300 mg by mouth daily.      chlorproMAZINE 100 MG tablet   Commonly known as: THORAZINE   Take 1 tablet by mouth every morning.      doxepin 50 MG capsule   Commonly known as: SINEQUAN   Take 1 capsule by mouth At bedtime.      gabapentin 800 MG tablet   Commonly known as: NEURONTIN   Take 800 mg by mouth at bedtime.      pantoprazole 40 MG tablet   Commonly known as: PROTONIX   Take 40 mg by mouth every morning.      polyethylene glycol packet   Commonly known as: MIRALAX / GLYCOLAX   Take 17 g by mouth daily.      pregabalin 75 MG capsule   Commonly known as: LYRICA   Take 75 mg by mouth 2 (two) times daily.      PROAIR HFA 108 (90 BASE) MCG/ACT inhaler   Generic drug: albuterol   Inhale 2 puffs into the lungs Twice daily as needed.      rOPINIRole 0.5 MG tablet   Commonly known as: REQUIP   Take 1 tablet by mouth at bedtime.      traMADol 50 MG tablet   Commonly known as: ULTRAM   Take 50 mg by mouth every 6 (six) hours as needed.      traZODone 150 MG tablet   Commonly known as: DESYREL   Take 150 mg by mouth at bedtime.      VIIBRYD 40 MG Tabs   Generic drug: Vilazodone HCl   Take 40 mg by mouth every morning.              The results of significant diagnostics from this hospitalization (including imaging, microbiology,  ancillary and laboratory) are listed below for reference.    Significant Diagnostic Studies: Ct Abdomen Pelvis W Contrast  11/09/2011  *RADIOLOGY REPORT*  Clinical Data: Right quadrant pain, nausea, chest pain and tightness, prior hysterectomy  CT ABDOMEN AND PELVIS WITH CONTRAST  Technique:  Multidetector CT imaging of the abdomen  and pelvis was performed following the standard protocol during bolus administration of intravenous contrast.  Contrast: OMNIPAQUE IOHEXOL 300 MG/ML  SOLN  Comparison: None.  Findings: Minimal scattered subpleural atelectasis.  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  Normal heart size.  No pericardial or pleural effusion.  Lower esophagus demonstrates mild wall thickening without associated hiatal hernia, suspect mild esophagitis.  Abdomen:  Liver, gallbladder, biliary system, pancreas, spleen, adrenal glands, and kidneys demonstrate no acute process and are within normal limits for age.  No abdominal free fluid, fluid collection, hemorrhage, adenopathy, or abscess.  Atherosclerosis of the aorta without aneurysm.  Negative for bowel obstruction, dilatation, ileus pattern, or free air.  Pelvis:  Normal appendix demonstrated.  Previous hysterectomy noted.  Retained stool throughout the distal colon.  No pelvic free fluid, fluid collection, hemorrhage, adenopathy, abscess, inguinal abnormality, or hernia.  Degenerative changes of the lower lumbar spine.  IMPRESSION: No acute intra-abdominal or pelvic finding.  Nonspecific lower esophageal mild wall thickening.  Prior hysterectomy.  Original Report Authenticated By: Judie Petit. Ruel Favors, M.D.   US Abdomen Limited Ruq  11/09/2011  *RADIOLOGY REPORT*  Clinical Data:  Right upper quadrant pain, nausea  LIMITED ABDOMINAL ULTRASOUND - RIGHT UPPER QUADRANT  Comparison:  CT abdomen/pelvis dated 11/08/2011.  Findings:  Gallbladder:  No gallstones, gallbladder wall thickening, or pericholecystic fluid.  Negative sonographic Murphy's  sign.  Common bile duct:  Fusiform dilatation of the mid common duct, measuring 8.5 mm.  No choledocholithiasis is seen.  Liver:  Within normal limits for parenchymal echogenicity.  No focal hepatic lesion is seen.  No intrahepatic ductal dilatation.  IMPRESSION: Fusiform dilatation of the mid common duct, measuring 8.5 mm.  No choledocholithiasis is seen.  Normal sonographic appearance of the gallbladder.                   Original Report Authenticated By: Charline Bills, M.D.       Labs: Basic Metabolic Panel:  Lab 11/08/11 1610  NA 140  K 3.5  CL 107  CO2 24  GLUCOSE 127*  BUN 10  CREATININE 0.79  CALCIUM 9.3  MG --  PHOS --   Liver Function Tests:  Lab 11/08/11 2218  AST 16  ALT 11  ALKPHOS 75  BILITOT 0.1*  PROT 6.3  ALBUMIN 3.5    Lab 11/08/11 2218  LIPASE 42  AMYLASE --    CBC:  Lab 11/08/11 2218  WBC 9.5  NEUTROABS 5.7  HGB 14.8  HCT 43.1  MCV 100.2*  PLT 254   Cardiac Enzymes:  Lab 11/09/11 0847 11/09/11 0233 11/09/11 0004 11/08/11 2218  CKTOTAL 76 98 -- --  CKMB 2.7 2.9 -- --  CKMBINDEX -- -- -- --  TROPONINI <0.30 <0.30 <0.30 <0.30     Time coordinating discharge: Greater than 30 minutes  Signed:  GOSRANI,NIMISH C  [facility] 11/09/2011, 1:54 PM

## 2011-11-09 NOTE — Progress Notes (Signed)
     Subjective: This lady came in with right upper quadrant abdominal pain primarily. There was a suggestion that she also had chest pain. However the history she is me today is more right upper quadrant abdominal pain. She has a history of esophageal stricture at the lower end which was dilated. CT scan of the abdomen shows mild lower esophagitis. Serial cardiac enzymes are negative. She is due to have an ultrasound of the abdomen today, her liver enzymes are unremarkable/not elevated.           Physical Exam: Blood pressure 134/93, pulse 67, temperature 97.6 F (36.4 C), temperature source Oral, resp. rate 18, height 5\' 5"  (1.651 m), weight 83.507 kg (184 lb 1.6 oz), SpO2 95.00%. She looks systemically well. Heart sounds are present and normal. Lung physically. Abdomen is soft and mildly tender, even in the right upper quadrant.   Investigations:     Basic Metabolic Panel:  Basename 11/08/11 2218  NA 140  K 3.5  CL 107  CO2 24  GLUCOSE 127*  BUN 10  CREATININE 0.79  CALCIUM 9.3  MG --  PHOS --   Liver Function Tests:  [facility] 11/08/11 2218  AST 16  ALT 11  ALKPHOS 75  BILITOT 0.1*  PROT 6.3  ALBUMIN 3.5     CBC:  Basename 11/08/11 2218  WBC 9.5  NEUTROABS 5.7  HGB 14.8  HCT 43.1  MCV 100.2*  PLT 254    Ct Abdomen Pelvis W Contrast  11/09/2011  *RADIOLOGY REPORT*  Clinical Data: Right quadrant pain, nausea, chest pain and tightness, prior hysterectomy  CT ABDOMEN AND PELVIS WITH CONTRAST  Technique:  Multidetector CT imaging of the abdomen and pelvis was performed following the standard protocol during bolus administration of intravenous contrast.  Contrast: OMNIPAQUE IOHEXOL 300 MG/ML  SOLN  Comparison: None.  Findings: Minimal scattered subpleural atelectasis.  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  Normal heart size.  No pericardial or pleural effusion.  Lower esophagus demonstrates mild wall thickening without associated hiatal  hernia, suspect mild esophagitis.  Abdomen:  Liver, gallbladder, biliary system, pancreas, spleen, adrenal glands, and kidneys demonstrate no acute process and are within normal limits for age.  No abdominal free fluid, fluid collection, hemorrhage, adenopathy, or abscess.  Atherosclerosis of the aorta without aneurysm.  Negative for bowel obstruction, dilatation, ileus pattern, or free air.  Pelvis:  Normal appendix demonstrated.  Previous hysterectomy noted.  Retained stool throughout the distal colon.  No pelvic free fluid, fluid collection, hemorrhage, adenopathy, abscess, inguinal abnormality, or hernia.  Degenerative changes of the lower lumbar spine.  IMPRESSION: No acute intra-abdominal or pelvic finding.  Nonspecific lower esophageal mild wall thickening.  Prior hysterectomy.  Original Report Authenticated By: Judie Petit. Ruel Favors, M.D.      Medications: I have reviewed the patient's current medications.  Impression: 1. Right upper quadrant abdominal pain, unclear etiology. No evidence of cardiac ischemia or infarction. 2. Tobacco abuse. 3. Obesity.     Plan: 1. Await ultrasound of the abdomen.     LOS: 1 day   Wilson Singer Pager [phone]  11/09/2011, 8:34 AM
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
## 2011-11-09 NOTE — Progress Notes (Signed)
Paged MD. Results back from [place]. Pt also requesting to restart Celebrex.

## 2011-11-09 NOTE — Progress Notes (Signed)
UR chart review completed.  

## 2011-11-09 NOTE — Progress Notes (Signed)
All discharge instructions gone over with patient and family member using teach back method. All questions and concerns answered. Pt requesting to speak to MD about a prescriptions for pain medicine. MD paged. Patient and family member spoke to MD on the phone. Pt discharged home with family member.

## 2011-11-16 ENCOUNTER — Encounter
Payer: Medicaid Other | Attending: Physical Medicine and Rehabilitation | Admitting: Physical Medicine and Rehabilitation

## 2011-11-16 ENCOUNTER — Telehealth: Payer: Self-pay | Admitting: *Deleted

## 2011-11-16 ENCOUNTER — Encounter: Payer: Self-pay | Admitting: Internal Medicine

## 2011-11-16 VITALS — BP 150/73 | HR 100 | Resp 14 | Ht 65.0 in | Wt 184.2 lb

## 2011-11-16 DIAGNOSIS — M79609 Pain in unspecified limb: Secondary | ICD-10-CM | POA: Insufficient documentation

## 2011-11-16 DIAGNOSIS — M545 Low back pain, unspecified: Secondary | ICD-10-CM | POA: Insufficient documentation

## 2011-11-16 DIAGNOSIS — F17200 Nicotine dependence, unspecified, uncomplicated: Secondary | ICD-10-CM | POA: Insufficient documentation

## 2011-11-16 DIAGNOSIS — M25569 Pain in unspecified knee: Secondary | ICD-10-CM | POA: Insufficient documentation

## 2011-11-16 MED ORDER — DICLOFENAC EPOLAMINE 1.3 % TD PTCH: 1.0000 | MEDICATED_PATCH | Freq: Two times a day (BID) | TRANSDERMAL | Status: DC
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
## 2011-11-16 NOTE — Telephone Encounter (Signed)
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 Preferred is Voltaren Gel.  Needs to be changed or a prior auth done, but Medicaid may not approve.

## 2011-11-17 ENCOUNTER — Encounter: Payer: Self-pay | Admitting: Gastroenterology

## 2011-11-17 ENCOUNTER — Ambulatory Visit (INDEPENDENT_AMBULATORY_CARE_PROVIDER_SITE_OTHER): Payer: Medicaid Other | Admitting: Gastroenterology

## 2011-11-17 VITALS — BP 127/90 | HR 84 | Temp 97.6°F | Ht 65.0 in | Wt 182.8 lb

## 2011-11-17 DIAGNOSIS — K5900 Constipation, unspecified: Secondary | ICD-10-CM

## 2011-11-17 DIAGNOSIS — R109 Unspecified abdominal pain: Secondary | ICD-10-CM

## 2011-11-17 MED ORDER — PANTOPRAZOLE SODIUM 40 MG PO TBEC: 40.0000 mg | DELAYED_RELEASE_TABLET | ORAL | Status: DC

## 2011-11-17 MED ORDER — LUBIPROSTONE 24 MCG PO CAPS: 24.0000 ug | ORAL_CAPSULE | Freq: Two times a day (BID) | ORAL | Status: AC

## 2011-11-17 MED ORDER — DICLOFENAC SODIUM 1 % TD GEL: 1.0000 "application " | Freq: Four times a day (QID) | TRANSDERMAL | Status: DC

## 2011-11-17 NOTE — Telephone Encounter (Signed)
Try a prior auth. First please

## 2011-11-17 NOTE — Patient Instructions (Addendum)
Start taking Amitiza twice a day, WITH A MEAL to avoid nausea.   Continue taking Protonix daily.  Review the reflux diet and high fiber diet. Drink at least 6-8 glasses of water a day.  We will see you back in 2 months.   High Fiber Diet A high fiber diet changes your normal diet to include more whole grains, legumes, fruits, and vegetables. Changes in the diet involve replacing refined carbohydrates with unrefined foods. The calorie level of the diet is essentially unchanged. The Dietary Reference Intake (recommended amount) for adult males is 38 g per day. For adult females, it is 25 g per day. Pregnant and lactating women should consume 28 g of fiber per day. Fiber is the intact part of a plant that is not broken down during digestion. Functional fiber is fiber that has been isolated from the plant to provide a beneficial effect in the body. PURPOSE  Increase stool bulk.   Ease and regulate bowel movements.   Lower cholesterol.  INDICATIONS THAT YOU NEED MORE FIBER  Constipation and hemorrhoids.   Uncomplicated diverticulosis (intestine condition) and irritable bowel syndrome.   Weight management.   As a protective measure against hardening of the arteries (atherosclerosis), diabetes, and cancer.  NOTE OF CAUTION If you have a digestive or bowel problem, ask your caregiver for advice before adding high fiber foods to your diet. Some of the following medical problems are such that a high fiber diet should not be used without consulting your caregiver:  Acute diverticulitis (intestine infection).   Partial small bowel obstructions.   Complicated diverticular disease involving bleeding, rupture (perforation), or abscess (boil, furuncle).   Presence of autonomic neuropathy (nerve damage) or gastric paresis (stomach cannot empty itself).  GUIDELINES FOR INCREASING FIBER  Start adding fiber to the diet slowly. A gradual increase of about 5 more grams (2 slices of whole-wheat  bread, 2 servings of most fruits or vegetables, or 1 bowl of high fiber cereal) per day is best. Too rapid an increase in fiber may result in constipation, flatulence, and bloating.   Drink enough water and fluids to keep your urine clear or pale yellow. Water, juice, or caffeine-free drinks are recommended. Not drinking enough fluid may cause constipation.   Eat a variety of high fiber foods rather than one type of fiber.   Try to increase your intake of fiber through using high fiber foods rather than fiber pills or supplements that contain small amounts of fiber.   The goal is to change the types of food eaten. Do not supplement your present diet with high fiber foods, but replace foods in your present diet.  INCLUDE A VARIETY OF FIBER SOURCES  Replace refined and processed grains with whole grains, canned fruits with fresh fruits, and incorporate other fiber sources. White rice, white breads, and most bakery goods contain little or no fiber.   Brown whole-grain rice, buckwheat oats, and many fruits and vegetables are all good sources of fiber. These include: broccoli, Brussels sprouts, cabbage, cauliflower, beets, sweet potatoes, white potatoes (skin on), carrots, tomatoes, eggplant, squash, berries, fresh fruits, and dried fruits.   Cereals appear to be the richest source of fiber. Cereal fiber is found in whole grains and bran. Bran is the fiber-rich outer coat of cereal grain, which is largely removed in refining. In whole-grain cereals, the bran remains. In breakfast cereals, the largest amount of fiber is found in those with "bran" in their names. The fiber content is sometimes indicated on  the label.   You may need to include additional fruits and vegetables each day.   In baking, for 1 cup white flour, you may use the following substitutions:   1 cup whole-wheat flour minus 2 tbs.    cup white flour plus  cup whole-wheat flour.  Document Released: 03/21/2005 Document Revised:  03/10/2011 Document Reviewed: 01/27/2009 [facility] Patient Information 2012 [place], [place].   Gastroesophageal Reflux Disease, Adult Gastroesophageal reflux disease (GERD) happens when acid from your stomach flows up into the esophagus. When acid comes in contact with the esophagus, the acid causes soreness (inflammation) in the esophagus. Over time, GERD may create small holes (ulcers) in the lining of the esophagus. CAUSES    Increased body weight. This puts pressure on the stomach, making acid rise from the stomach into the esophagus.   Smoking. This increases acid production in the stomach.   Drinking alcohol. This causes decreased pressure in the lower esophageal sphincter (valve or ring of muscle between the esophagus and stomach), allowing acid from the stomach into the esophagus.   Late evening meals and a full stomach. This increases pressure and acid production in the stomach.   A malformed lower esophageal sphincter.  Sometimes, no cause is found. SYMPTOMS    Burning pain in the lower part of the mid-chest behind the breastbone and in the mid-stomach area. This may occur twice a week or more often.   Trouble swallowing.   Sore throat.   Dry cough.   Asthma-like symptoms including chest tightness, shortness of breath, or wheezing.  DIAGNOSIS   Your caregiver may be able to diagnose GERD based on your symptoms. In some cases, X-rays and other tests may be done to check for complications or to check the condition of your stomach and esophagus. TREATMENT   Your caregiver may recommend over-the-counter or prescription medicines to help decrease acid production. Ask your caregiver before starting or adding any new medicines.   HOME CARE INSTRUCTIONS    Change the factors that you can control. Ask your caregiver for guidance concerning weight loss, quitting smoking, and alcohol consumption.   Avoid foods and drinks that make your symptoms worse, such as:   Caffeine or  alcoholic drinks.   Chocolate.   Peppermint or mint flavorings.   Garlic and onions.   Spicy foods.   Citrus fruits, such as oranges, lemons, or limes.   Tomato-based foods such as sauce, chili, salsa, and pizza.   Fried and fatty foods.   Avoid lying down for the 3 hours prior to your bedtime or prior to taking a nap.   Eat small, frequent meals instead of large meals.   Wear loose-fitting clothing. Do not wear anything tight around your waist that causes pressure on your stomach.   Raise the head of your bed 6 to 8 inches with wood blocks to help you sleep. Extra pillows will not help.   Only take over-the-counter or prescription medicines for pain, discomfort, or fever as directed by your caregiver.   Do not take aspirin, ibuprofen, or other nonsteroidal anti-inflammatory drugs (NSAIDs).  SEEK IMMEDIATE MEDICAL CARE IF:    You have pain in your arms, neck, jaw, teeth, or back.   Your pain increases or changes in intensity or duration.   You develop nausea, vomiting, or sweating (diaphoresis).   You develop shortness of breath, or you faint.   Your vomit is green, yellow, black, or looks like coffee grounds or blood.   Your stool is red, bloody,  or black.  These symptoms could be signs of other problems, such as heart disease, gastric bleeding, or esophageal bleeding. MAKE SURE YOU:    Understand these instructions.   Will watch your condition.   Will get help right away if you are not doing well or get worse.  Document Released: 12/29/2004 Document Revised: 03/10/2011 Document Reviewed: 10/08/2010 [facility] Patient Information 2012 [place], [place].

## 2011-11-17 NOTE — Telephone Encounter (Signed)
Ok then we try voltaren gel first
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
## 2011-11-17 NOTE — Telephone Encounter (Signed)
Unfortunately there has to be a clinical acceptable reason for them to approve it. First is failure of preferred alternative, and the only one is Voltaren gel.  So she has to try it first .  Here are the medicaid requirements for approval: Failed two preferred drug(s). If only one preferred drug is available, then failed one preferred drug.        Previous episode of an unacceptable side effect or therapeutic failure.        Clinical contraindication, co-morbidity, or unique patient circumstance as a contraindication to preferred drug(s).        Age specific indications.        Unique clinical indication supported by FDA approval or peer reviewed literature.        Unacceptable clinical risk associated with therapeutic change.

## 2011-11-28 ENCOUNTER — Encounter: Payer: Self-pay | Admitting: Gastroenterology

## 2011-11-28 ENCOUNTER — Ambulatory Visit: Payer: Medicaid Other | Attending: Physical Medicine and Rehabilitation | Admitting: Physical Therapy

## 2011-11-28 DIAGNOSIS — R5381 Other malaise: Secondary | ICD-10-CM | POA: Insufficient documentation

## 2011-11-28 DIAGNOSIS — M25569 Pain in unspecified knee: Secondary | ICD-10-CM | POA: Insufficient documentation

## 2011-11-28 DIAGNOSIS — K5900 Constipation, unspecified: Secondary | ICD-10-CM | POA: Insufficient documentation

## 2011-11-28 DIAGNOSIS — IMO0001 Reserved for inherently not codable concepts without codable children: Secondary | ICD-10-CM | POA: Insufficient documentation

## 2011-11-28 DIAGNOSIS — M6281 Muscle weakness (generalized): Secondary | ICD-10-CM | POA: Insufficient documentation

## 2011-11-28 DIAGNOSIS — M545 Low back pain, unspecified: Secondary | ICD-10-CM | POA: Insufficient documentation

## 2011-11-28 NOTE — Assessment & Plan Note (Signed)
?  contributor to abdominal discomfort. Start Amitiza 24 mcg BID. 2 mos f/u.

## 2011-11-28 NOTE — Progress Notes (Signed)
Faxed to PCP

## 2011-11-28 NOTE — Assessment & Plan Note (Signed)
46 year old female with RUQ abdominal pain, not associated with eating/drinking. Denies N/V, normal LFTs and lipase on file. [place] of abdomen with fusiform dilatation of mid common duct measuring 8.75mm but NO choledocholithiasis. CT negative but does note retained stool. Also notes vague, intermittent lower abdominal discomfort. No rectal bleeding.  EGD on file reassuring. May be dealing with biliary etiology and/or underlying constipation. Need to start on bowel regimen and reassess. Consider HIDA, possible further evaluation of bile duct although normal LFTs and normal CT are on file.   Start Amitiza 24 mcg po BID 2 mos f/u

## 2011-12-06 ENCOUNTER — Ambulatory Visit: Payer: Medicaid Other | Attending: Physical Medicine and Rehabilitation

## 2011-12-06 DIAGNOSIS — IMO0001 Reserved for inherently not codable concepts without codable children: Secondary | ICD-10-CM | POA: Insufficient documentation

## 2011-12-06 DIAGNOSIS — M545 Low back pain, unspecified: Secondary | ICD-10-CM | POA: Insufficient documentation

## 2011-12-06 DIAGNOSIS — R5381 Other malaise: Secondary | ICD-10-CM | POA: Insufficient documentation

## 2011-12-06 DIAGNOSIS — M6281 Muscle weakness (generalized): Secondary | ICD-10-CM | POA: Insufficient documentation

## 2011-12-06 DIAGNOSIS — M25569 Pain in unspecified knee: Secondary | ICD-10-CM | POA: Insufficient documentation

## 2011-12-12 ENCOUNTER — Ambulatory Visit: Payer: Medicaid Other | Admitting: Physical Therapy

## 2011-12-14 ENCOUNTER — Encounter: Payer: Medicaid Other | Admitting: Physical Medicine and Rehabilitation

## 2011-12-15 ENCOUNTER — Ambulatory Visit: Payer: Medicaid Other | Admitting: Gastroenterology

## 2011-12-19 ENCOUNTER — Ambulatory Visit: Payer: Medicaid Other | Admitting: Physical Therapy

## 2011-12-20 ENCOUNTER — Encounter
Payer: Medicaid Other | Attending: Physical Medicine and Rehabilitation | Admitting: Physical Medicine and Rehabilitation

## 2011-12-20 ENCOUNTER — Encounter: Payer: Self-pay | Admitting: Physical Medicine and Rehabilitation

## 2011-12-20 VITALS — BP 145/63 | HR 102 | Resp 16 | Ht 65.0 in | Wt 191.0 lb

## 2011-12-20 DIAGNOSIS — M545 Low back pain, unspecified: Secondary | ICD-10-CM | POA: Insufficient documentation

## 2011-12-20 DIAGNOSIS — K219 Gastro-esophageal reflux disease without esophagitis: Secondary | ICD-10-CM | POA: Insufficient documentation

## 2011-12-20 DIAGNOSIS — M25569 Pain in unspecified knee: Secondary | ICD-10-CM | POA: Insufficient documentation

## 2011-12-20 DIAGNOSIS — E669 Obesity, unspecified: Secondary | ICD-10-CM | POA: Insufficient documentation

## 2011-12-20 DIAGNOSIS — M79609 Pain in unspecified limb: Secondary | ICD-10-CM | POA: Insufficient documentation

## 2011-12-20 DIAGNOSIS — F3289 Other specified depressive episodes: Secondary | ICD-10-CM | POA: Insufficient documentation

## 2011-12-20 DIAGNOSIS — F17200 Nicotine dependence, unspecified, uncomplicated: Secondary | ICD-10-CM | POA: Insufficient documentation

## 2011-12-20 DIAGNOSIS — F329 Major depressive disorder, single episode, unspecified: Secondary | ICD-10-CM | POA: Insufficient documentation

## 2011-12-20 DIAGNOSIS — G8929 Other chronic pain: Secondary | ICD-10-CM | POA: Insufficient documentation

## 2011-12-20 MED ORDER — GABAPENTIN 600 MG PO TABS: 300.0000 mg | ORAL_TABLET | Freq: Two times a day (BID) | ORAL | Status: DC

## 2011-12-20 MED ORDER — GABAPENTIN (ONCE-DAILY) 300 MG PO TABS: 1.0000 | ORAL_TABLET | Freq: Two times a day (BID) | ORAL | Status: DC

## 2011-12-20 NOTE — Patient Instructions (Signed)
Continue with the exercises you learned from PT at home.
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
## 2011-12-28 ENCOUNTER — Ambulatory Visit: Payer: Medicaid Other | Admitting: Physical Therapy

## 2012-01-17 ENCOUNTER — Encounter: Payer: Self-pay | Admitting: Gastroenterology

## 2012-01-17 ENCOUNTER — Ambulatory Visit (INDEPENDENT_AMBULATORY_CARE_PROVIDER_SITE_OTHER): Payer: Medicaid Other | Admitting: Gastroenterology

## 2012-01-17 VITALS — BP 101/68 | HR 83 | Temp 98.4°F | Ht 65.0 in | Wt 194.8 lb

## 2012-01-17 DIAGNOSIS — K838 Other specified diseases of biliary tract: Secondary | ICD-10-CM

## 2012-01-17 DIAGNOSIS — R109 Unspecified abdominal pain: Secondary | ICD-10-CM

## 2012-01-17 DIAGNOSIS — K5900 Constipation, unspecified: Secondary | ICD-10-CM

## 2012-01-17 MED ORDER — LUBIPROSTONE 24 MCG PO CAPS: 24.0000 ug | ORAL_CAPSULE | Freq: Two times a day (BID) | ORAL | Status: DC
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
## 2012-01-17 NOTE — Patient Instructions (Addendum)
We have set you up for imaging of your bile duct. We will call you with these results.  In the meantime, continue Protonix daily and Amitiza twice a day. We will see you back in 1 year or sooner as needed!

## 2012-01-18 DIAGNOSIS — K838 Other specified diseases of biliary tract: Secondary | ICD-10-CM | POA: Insufficient documentation

## 2012-01-18 HISTORY — DX: Other specified diseases of biliary tract: K83.8

## 2012-01-18 NOTE — Assessment & Plan Note (Signed)
  Fusiform dilatation of the mid common duct, measuring 8.5 mm on [place] in Aug 2013. No  Choledocholithiasis, normal LFTs, lipase. Pt asymptomatic, no pain currently. MRCP for further evaluation, ?choledochal cyst?  Will call pt with results. If benign, return in 1 year.

## 2012-01-18 NOTE — Assessment & Plan Note (Signed)
Excellent results with Amitiza. Continue current regimen. Return in 1 year or sooner as needed.

## 2012-01-18 NOTE — Progress Notes (Signed)
Faxed to PCP

## 2012-01-18 NOTE — Assessment & Plan Note (Signed)
Resolved. Likely due to underlying constipation, now under control.

## 2012-01-19 ENCOUNTER — Encounter: Payer: Self-pay | Admitting: Physical Medicine and Rehabilitation

## 2012-01-19 ENCOUNTER — Other Ambulatory Visit (HOSPITAL_COMMUNITY): Payer: Medicaid Other

## 2012-01-19 ENCOUNTER — Encounter
Payer: Medicaid Other | Attending: Physical Medicine and Rehabilitation | Admitting: Physical Medicine and Rehabilitation

## 2012-01-19 VITALS — BP 141/80 | HR 85 | Resp 14 | Ht 66.0 in | Wt 195.0 lb

## 2012-01-19 DIAGNOSIS — R635 Abnormal weight gain: Secondary | ICD-10-CM | POA: Insufficient documentation

## 2012-01-19 DIAGNOSIS — M79609 Pain in unspecified limb: Secondary | ICD-10-CM | POA: Insufficient documentation

## 2012-01-19 DIAGNOSIS — K219 Gastro-esophageal reflux disease without esophagitis: Secondary | ICD-10-CM | POA: Insufficient documentation

## 2012-01-19 DIAGNOSIS — M545 Low back pain, unspecified: Secondary | ICD-10-CM | POA: Insufficient documentation

## 2012-01-19 DIAGNOSIS — G8929 Other chronic pain: Secondary | ICD-10-CM | POA: Insufficient documentation

## 2012-01-19 DIAGNOSIS — M5137 Other intervertebral disc degeneration, lumbosacral region: Secondary | ICD-10-CM | POA: Insufficient documentation

## 2012-01-19 DIAGNOSIS — M51379 Other intervertebral disc degeneration, lumbosacral region without mention of lumbar back pain or lower extremity pain: Secondary | ICD-10-CM | POA: Insufficient documentation

## 2012-01-19 DIAGNOSIS — F329 Major depressive disorder, single episode, unspecified: Secondary | ICD-10-CM | POA: Insufficient documentation

## 2012-01-19 DIAGNOSIS — F3289 Other specified depressive episodes: Secondary | ICD-10-CM | POA: Insufficient documentation

## 2012-01-19 DIAGNOSIS — F17200 Nicotine dependence, unspecified, uncomplicated: Secondary | ICD-10-CM | POA: Insufficient documentation

## 2012-01-19 DIAGNOSIS — M25569 Pain in unspecified knee: Secondary | ICD-10-CM | POA: Insufficient documentation

## 2012-01-19 DIAGNOSIS — M25561 Pain in right knee: Secondary | ICD-10-CM

## 2012-01-19 MED ORDER — GABAPENTIN 600 MG PO TABS: 600.0000 mg | ORAL_TABLET | Freq: Two times a day (BID) | ORAL | Status: DC

## 2012-01-19 NOTE — Patient Instructions (Signed)
Try to be as active as pain allows, exercise and stretch daily.
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
## 2012-01-20 ENCOUNTER — Telehealth: Payer: Self-pay

## 2012-01-20 ENCOUNTER — Ambulatory Visit (HOSPITAL_COMMUNITY)
Admission: RE | Admit: 2012-01-20 | Discharge: 2012-01-20 | Disposition: A | Discharge status: Home / Self Care | Payer: Medicaid Other | Source: Ambulatory Visit | Attending: Gastroenterology | Admitting: Gastroenterology

## 2012-01-20 ENCOUNTER — Other Ambulatory Visit: Payer: Self-pay | Admitting: Gastroenterology

## 2012-01-20 DIAGNOSIS — K838 Other specified diseases of biliary tract: Secondary | ICD-10-CM

## 2012-01-20 DIAGNOSIS — E876 Hypokalemia: Secondary | ICD-10-CM

## 2012-01-20 LAB — POCT I-STAT, CHEM 8
BUN: 15 mg/dL (ref 6–23)
Chloride: 102 mEq/L (ref 96–112)
Glucose, Bld: 98 mg/dL (ref 70–99)
HCT: 50 % — ABNORMAL HIGH (ref 36.0–46.0)
Potassium: 3.2 mEq/L — ABNORMAL LOW (ref 3.5–5.1)

## 2012-01-20 MED ORDER — GADOBENATE DIMEGLUMINE 529 MG/ML IV SOLN
18.0000 mL | Freq: Once | INTRAVENOUS | Status: AC | PRN
  Administered 2012-01-20: 18 mL via INTRAVENOUS

## 2012-01-20 NOTE — Telephone Encounter (Signed)
Tried to call pt again, LM on voicemail

## 2012-01-20 NOTE — Telephone Encounter (Signed)
Kathlene November from [facility] called- they did some stat blood work on this pt because they needed a creatinine. The patients potassium is 3.2 and they wanted to make sure that someone addressed it today.

## 2012-01-20 NOTE — Telephone Encounter (Signed)
Tried to call pt- left detailed message on voicemail. rx called to [facility], lab order faxed to lab.

## 2012-01-20 NOTE — Progress Notes (Signed)
Blood sample obtained from right arm IV for Creatnine level.  

## 2012-01-20 NOTE — Telephone Encounter (Signed)
Please advise pt to begin KCl BID today & through weekend, #20, 0RF. Raynelle Fanning, please call RX to pharmacy of choice. Recheck K on Monday.  I put in order. Thanks

## 2012-01-23 LAB — BASIC METABOLIC PANEL
CO2: 28 mEq/L (ref 19–32)
Chloride: 103 mEq/L (ref 96–112)
Sodium: 138 mEq/L (ref 135–145)

## 2012-01-23 NOTE — Telephone Encounter (Signed)
Tried to call pt- LMOM 

## 2012-01-23 NOTE — Telephone Encounter (Signed)
Please follow up with pt today & be sure she has lab rechecked today. Thanks

## 2012-01-23 NOTE — Progress Notes (Signed)
Quick Note:    Addressed  ______

## 2012-01-24 NOTE — Telephone Encounter (Signed)
LMOM to call.

## 2012-01-24 NOTE — Telephone Encounter (Signed)
Potassium normal. Stop potassium supplements.  MRCP without evidence of CBD stones or cyst. Keep appt for 1 year as planned.

## 2012-01-24 NOTE — Progress Notes (Signed)
Quick Note:  LMOM to call. ______ 

## 2012-01-24 NOTE — Telephone Encounter (Signed)
Was unable to reach pt yesterday, but she did have blood work done. See lab results.

## 2012-01-24 NOTE — Progress Notes (Signed)
Quick Note:  Please let pt know that her MRCP came back without signs of anything in the common bile duct of concern. She has normal LFTs. Return in 1 year. Contact us if any problems. ______

## 2012-01-25 NOTE — Telephone Encounter (Signed)
Pt aware. Darl Pikes please nic ov in 1 year.

## 2012-01-25 NOTE — Progress Notes (Signed)
Quick Note:  Pt is aware. ______ 

## 2012-01-26 ENCOUNTER — Encounter: Payer: Self-pay | Admitting: *Deleted

## 2012-01-27 ENCOUNTER — Encounter: Payer: Medicaid Other | Attending: Nurse Practitioner | Admitting: *Deleted

## 2012-01-27 VITALS — Ht 64.0 in | Wt 198.0 lb

## 2012-01-27 DIAGNOSIS — Z713 Dietary counseling and surveillance: Secondary | ICD-10-CM | POA: Insufficient documentation

## 2012-01-27 DIAGNOSIS — I10 Essential (primary) hypertension: Secondary | ICD-10-CM | POA: Insufficient documentation

## 2012-01-27 DIAGNOSIS — E669 Obesity, unspecified: Secondary | ICD-10-CM | POA: Insufficient documentation

## 2012-01-27 NOTE — Progress Notes (Signed)
  Medical Nutrition Therapy:  Appt start time: 0945 end time:  1045.   Assessment:  Primary concerns today: Obesity, possibly secondary to meds, pain leading to highly sedentary lifestyle. Concerned with limits of activity, some confusion over good foods to eat, some possible guilt over food choices. Clear nutrition knowledge deficit in pt and fiance, particularly relating to starch content of foods. Pt low self-esteem related to food intake evident. Concerns with nausea, likely secondary to volume of meds.  MEDICATIONS: see list.   DIETARY INTAKE:  Usual eating pattern includes 1 meals and 0-1 snacks per day.  Everyday foods include fried potatoes or fatty potatoes.  Avoided foods include regular soda, few noted otherwise.    24-hr recall:  B ( AM): possibly lance PB crackers, one fried egg.  Snk ( AM): N/A  L ( PM): N/A Snk ( PM): N/A D ( PM): large dinner includes large meat portions, mostly high fat (ribeye steak, fried chix, hot dog, hamburger), multiple starches like noodles, potatoes, peas, corn. Pt notes former intake of salad that has discontinued. Snk ( PM): N/A Beverages: diet soda, water, crystal light  Usual physical activity: very limited due to pain. Max 5 minutes walking. Some playing with dogs.  Estimated energy needs: 1200 calories 135 g carbohydrates 90 g protein 33 g fat  Progress Towards Goal(s):  In progress.   Nutritional Diagnosis:  NI-1.5 Excessive energy intake As related to minimal activity, knowledge deficit.  As evidenced by BMI >30, reports of wt gain 2-3 lbs/wk..    Intervention:  Nutrition education, mostly related to MyPlate, more balance to meal intake (3 per day). Some info provided regarding reducing sodium content in vegetables, canned foods.  Goals:  Eat 3 times daily spaced by at least 4 hours each.  Breakfast and lunch may be small, but should include a protein- eggs, peanut butter, milk, meat, etc.  Focus on MyPlate at supper time.  Plate should be half non-starchy veggies, one quarter starch, one quarter meat.  Aim for minimum 15 minutes of activity daily, spread out as needed for pain concerns. Do enjoyable activity like playing with dogs or short walks.  Make some slight changes to enjoyable foods- less fried food in favor of baked or grilled, smaller portions of starches like potatoes, rinsing canned vegetables well to reduce salt content.  Try to make most of food at home, rather than pre-purchased.  Handouts given during visit include:  MyPlate Method  Yellow meal card (shows categories of foods, like starch, veg, proteins with examples)  Monitoring/Evaluation:  Dietary intake, exercise, and body weight in 4 week(s).

## 2012-01-27 NOTE — Patient Instructions (Addendum)
Goals:  Eat 3 times daily spaced by at least 4 hours each.  Breakfast and lunch may be small, but should include a protein- eggs, peanut butter, milk, meat, etc.  Focus on MyPlate at supper time. Plate should be half non-starchy veggies, one quarter starch, one quarter meat.  Aim for minimum 15 minutes of activity daily, spread out as needed for pain concerns. Do enjoyable activity like playing with dogs or short walks.  Make some slight changes to enjoyable foods- less fried food in favor of baked or grilled, smaller portions of starches like potatoes, rinsing canned vegetables well to reduce salt content.  Try to make most of food at home, rather than pre-purchased.

## 2012-02-01 NOTE — Telephone Encounter (Signed)
Reminder in epic to follow up in one year °

## 2012-02-24 ENCOUNTER — Encounter: Payer: Self-pay | Admitting: *Deleted

## 2012-02-24 ENCOUNTER — Encounter: Payer: Medicaid Other | Attending: Nurse Practitioner | Admitting: *Deleted

## 2012-02-24 DIAGNOSIS — I10 Essential (primary) hypertension: Secondary | ICD-10-CM | POA: Insufficient documentation

## 2012-02-24 DIAGNOSIS — E669 Obesity, unspecified: Secondary | ICD-10-CM | POA: Insufficient documentation

## 2012-02-24 DIAGNOSIS — Z713 Dietary counseling and surveillance: Secondary | ICD-10-CM | POA: Insufficient documentation

## 2012-02-24 NOTE — Progress Notes (Signed)
  Medical Nutrition Therapy:  Appt start time: 0915 end time:  0945.  Assessment:  Primary concerns today: follow up visit for Obesity. States she is off of Abilify medication and on different similar medication Latuda not associated with weight gain. Happy with weight loss of 3-4 pounds! States she is eating 3 meals a day more often, she is drinking an herbal tea before meal to help with appetite control. Using olive oil to cook with, is eating more vegetables at dinner. States nausea is not a problem anymore. She is here with her fiancee who appears very supportive.  MEDICATIONS: see list.   DIETARY INTAKE:  Usual eating pattern includes 1 meals and 0-1 snacks per day.  Everyday foods include fried potatoes or fatty potatoes.  Avoided foods include regular soda, few noted otherwise.    24-hr recall:  B ( AM): Unsweetened cereal with 1% milk, OR scrambled eggs occasionally with fat back OR package of crackers Snk ( AM): N/A  L ( PM):eating lunch occasionally now. Used to skip all the time Snk ( PM): N/A D ( PM): choosing leaner meats, fish more often, more vegetables including peas, corn. Pt notes former intake of salad that has discontinued. Snk ( PM): N/A Beverages: diet soda, water, crystal light  Usual physical activity: very limited due to pain. Max 5 minutes walking. Some playing with dogs.  Estimated energy needs: 1400 calories 158 g carbohydrates 105 g protein 39 g fat  Progress Towards Goal(s):  In progress.   Nutritional Diagnosis:  NI-1.5 Excessive energy intake As related to minimal activity, knowledge deficit.  As evidenced by BMI >30, reports of wt gain 2-3 lbs/wk..    Intervention:  Commended both patient and her fiancee on their positive behavior changes. Reviewed rationale of not skipping meals and initiated basic Carb Counting instruction, which both demonstrated good understanding of. Also taught the reading of food label  Goals: Continue eating 3 times daily  and consider aiming for 2-3 Carb Choices per meal Continue including a protein- eggs, peanut butter, milk, meat, etc. At each meal as able Continue using the MyPlate at supper time. Plate should be half non-starchy veggies, one quarter starch, one quarter meat. Aim for minimum 15 minutes of activity daily, spread out in 5 minute inncrements for pain concerns. Do enjoyable activity like playing with dogs, carrying wood up the hill, using new exercise machine or short walks. Continue choosing less fried food in favor of baked or grilled, smaller portions of starches like potatoes,  Consider reading food labels for total carbohydrate of foods as able. Great job! Keep up the good work!  Handouts given during visit include: Carb Counting and Food Label handouts  Monitoring/Evaluation:  Dietary intake, exercise, and body weight in 4 week(s).

## 2012-02-24 NOTE — Patient Instructions (Addendum)
Goals:  Continue eating 3 times daily and consider aiming for 2-3 Carb Choices per meal  Continue including a protein- eggs, peanut butter, milk, meat, etc. At each meal as able  Continue using the MyPlate at supper time. Plate should be half non-starchy veggies, one quarter starch, one quarter meat.  Aim for minimum 15 minutes of activity daily, spread out in 5 minute inncrements for pain concerns. Do enjoyable activity like playing with dogs, carrying wood up the hill, using new exercise machine or short walks.  Continue choosing less fried food in favor of baked or grilled, smaller portions of starches like potatoes, rinsing canned vegetables well to reduce salt content.  Consider reading food labels for total carbohydrate of foods as able.  Great job! Keep up the good work! 

## 2012-02-28 ENCOUNTER — Encounter (HOSPITAL_COMMUNITY): Payer: Self-pay | Admitting: Emergency Medicine

## 2012-02-28 ENCOUNTER — Emergency Department (HOSPITAL_COMMUNITY)
Admission: EM | Admit: 2012-02-28 | Discharge: 2012-02-28 | Disposition: A | Discharge status: Home / Self Care | Payer: Medicaid Other | Attending: Emergency Medicine | Admitting: Emergency Medicine

## 2012-02-28 DIAGNOSIS — Z8659 Personal history of other mental and behavioral disorders: Secondary | ICD-10-CM | POA: Insufficient documentation

## 2012-02-28 DIAGNOSIS — Z8701 Personal history of pneumonia (recurrent): Secondary | ICD-10-CM | POA: Insufficient documentation

## 2012-02-28 DIAGNOSIS — Y92009 Unspecified place in unspecified non-institutional (private) residence as the place of occurrence of the external cause: Secondary | ICD-10-CM | POA: Insufficient documentation

## 2012-02-28 DIAGNOSIS — Z79899 Other long term (current) drug therapy: Secondary | ICD-10-CM | POA: Insufficient documentation

## 2012-02-28 DIAGNOSIS — Z23 Encounter for immunization: Secondary | ICD-10-CM | POA: Insufficient documentation

## 2012-02-28 DIAGNOSIS — F17200 Nicotine dependence, unspecified, uncomplicated: Secondary | ICD-10-CM | POA: Insufficient documentation

## 2012-02-28 DIAGNOSIS — S51809A Unspecified open wound of unspecified forearm, initial encounter: Secondary | ICD-10-CM | POA: Insufficient documentation

## 2012-02-28 DIAGNOSIS — Z8739 Personal history of other diseases of the musculoskeletal system and connective tissue: Secondary | ICD-10-CM | POA: Insufficient documentation

## 2012-02-28 DIAGNOSIS — Z8719 Personal history of other diseases of the digestive system: Secondary | ICD-10-CM | POA: Insufficient documentation

## 2012-02-28 DIAGNOSIS — W268XXA Contact with other sharp object(s), not elsewhere classified, initial encounter: Secondary | ICD-10-CM | POA: Insufficient documentation

## 2012-02-28 DIAGNOSIS — Y9389 Activity, other specified: Secondary | ICD-10-CM | POA: Insufficient documentation

## 2012-02-28 DIAGNOSIS — S51812A Laceration without foreign body of left forearm, initial encounter: Secondary | ICD-10-CM

## 2012-02-28 MED ORDER — IBUPROFEN 600 MG PO TABS: 600.0000 mg | ORAL_TABLET | Freq: Four times a day (QID) | ORAL | Status: AC | PRN

## 2012-02-28 MED ORDER — LIDOCAINE-EPINEPHRINE (PF) 1 %-1:200000 IJ SOLN
10.0000 mL | Freq: Once | INTRAMUSCULAR | Status: AC
  Administered 2012-02-28: 10 mL
  Filled 2012-02-28: qty 10

## 2012-02-28 MED ORDER — TETANUS-DIPHTH-ACELL PERTUSSIS 5-2.5-18.5 LF-MCG/0.5 IM SUSP
0.5000 mL | Freq: Once | INTRAMUSCULAR | Status: AC
  Administered 2012-02-28: 0.5 mL via INTRAMUSCULAR
  Filled 2012-02-28: qty 0.5

## 2012-02-28 MED ORDER — BACITRACIN-NEOMYCIN-POLYMYXIN 400-5-5000 EX OINT
TOPICAL_OINTMENT | Freq: Once | CUTANEOUS | Status: AC
  Administered 2012-02-28: 15:00:00 via TOPICAL
  Filled 2012-02-28: qty 1

## 2012-02-28 NOTE — ED Notes (Signed)
Pt presents with laceration to left arm. Pt states she was moving furniture when she tripped and fell. Bleeding is controlled. Pt reports burning pain in area.
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
## 2012-02-29 ENCOUNTER — Emergency Department (HOSPITAL_COMMUNITY): Payer: Medicaid Other

## 2012-02-29 ENCOUNTER — Emergency Department (HOSPITAL_COMMUNITY)
Admission: EM | Admit: 2012-02-29 | Discharge: 2012-02-29 | Disposition: A | Discharge status: Home / Self Care | Payer: Medicaid Other | Attending: Emergency Medicine | Admitting: Emergency Medicine

## 2012-02-29 ENCOUNTER — Encounter (HOSPITAL_COMMUNITY): Payer: Self-pay | Admitting: *Deleted

## 2012-02-29 DIAGNOSIS — F3289 Other specified depressive episodes: Secondary | ICD-10-CM | POA: Insufficient documentation

## 2012-02-29 DIAGNOSIS — F1910 Other psychoactive substance abuse, uncomplicated: Secondary | ICD-10-CM | POA: Insufficient documentation

## 2012-02-29 DIAGNOSIS — W010XXA Fall on same level from slipping, tripping and stumbling without subsequent striking against object, initial encounter: Secondary | ICD-10-CM | POA: Insufficient documentation

## 2012-02-29 DIAGNOSIS — M549 Dorsalgia, unspecified: Secondary | ICD-10-CM | POA: Insufficient documentation

## 2012-02-29 DIAGNOSIS — G8929 Other chronic pain: Secondary | ICD-10-CM | POA: Insufficient documentation

## 2012-02-29 DIAGNOSIS — G9332 Myalgic encephalomyelitis/chronic fatigue syndrome: Secondary | ICD-10-CM | POA: Insufficient documentation

## 2012-02-29 DIAGNOSIS — F17200 Nicotine dependence, unspecified, uncomplicated: Secondary | ICD-10-CM | POA: Insufficient documentation

## 2012-02-29 DIAGNOSIS — Z8679 Personal history of other diseases of the circulatory system: Secondary | ICD-10-CM | POA: Insufficient documentation

## 2012-02-29 DIAGNOSIS — Z79899 Other long term (current) drug therapy: Secondary | ICD-10-CM | POA: Insufficient documentation

## 2012-02-29 DIAGNOSIS — IMO0001 Reserved for inherently not codable concepts without codable children: Secondary | ICD-10-CM | POA: Insufficient documentation

## 2012-02-29 DIAGNOSIS — J40 Bronchitis, not specified as acute or chronic: Secondary | ICD-10-CM

## 2012-02-29 DIAGNOSIS — K219 Gastro-esophageal reflux disease without esophagitis: Secondary | ICD-10-CM | POA: Insufficient documentation

## 2012-02-29 DIAGNOSIS — Y9389 Activity, other specified: Secondary | ICD-10-CM | POA: Insufficient documentation

## 2012-02-29 DIAGNOSIS — J189 Pneumonia, unspecified organism: Secondary | ICD-10-CM | POA: Insufficient documentation

## 2012-02-29 DIAGNOSIS — F209 Schizophrenia, unspecified: Secondary | ICD-10-CM | POA: Insufficient documentation

## 2012-02-29 DIAGNOSIS — Y929 Unspecified place or not applicable: Secondary | ICD-10-CM | POA: Insufficient documentation

## 2012-02-29 DIAGNOSIS — Z87828 Personal history of other (healed) physical injury and trauma: Secondary | ICD-10-CM | POA: Insufficient documentation

## 2012-02-29 DIAGNOSIS — M129 Arthropathy, unspecified: Secondary | ICD-10-CM | POA: Insufficient documentation

## 2012-02-29 DIAGNOSIS — R5382 Chronic fatigue, unspecified: Secondary | ICD-10-CM | POA: Insufficient documentation

## 2012-02-29 DIAGNOSIS — F411 Generalized anxiety disorder: Secondary | ICD-10-CM | POA: Insufficient documentation

## 2012-02-29 DIAGNOSIS — F319 Bipolar disorder, unspecified: Secondary | ICD-10-CM | POA: Insufficient documentation

## 2012-02-29 DIAGNOSIS — F309 Manic episode, unspecified: Secondary | ICD-10-CM | POA: Insufficient documentation

## 2012-02-29 DIAGNOSIS — F329 Major depressive disorder, single episode, unspecified: Secondary | ICD-10-CM | POA: Insufficient documentation

## 2012-02-29 DIAGNOSIS — R0789 Other chest pain: Secondary | ICD-10-CM

## 2012-02-29 MED ORDER — CYCLOBENZAPRINE HCL 10 MG PO TABS
10.0000 mg | ORAL_TABLET | Freq: Once | ORAL | Status: AC
Start: 2012-02-29 — End: 2012-02-29
  Administered 2012-02-29: 10 mg via ORAL
  Filled 2012-02-29: qty 1

## 2012-02-29 MED ORDER — METHOCARBAMOL 500 MG PO TABS: ORAL_TABLET | ORAL | Status: DC

## 2012-02-29 MED ORDER — DOXYCYCLINE HYCLATE 100 MG PO CAPS: 100.0000 mg | ORAL_CAPSULE | Freq: Two times a day (BID) | ORAL | Status: DC

## 2012-02-29 NOTE — ED Notes (Signed)
Tripped and fell yesterday, seen here, had sutures to lt forearm,  Since then pain rt anterior ribs.

## 2012-02-29 NOTE — ED Provider Notes (Signed)
Medical screening examination/treatment/procedure(s) were performed by non-physician practitioner and as supervising physician I was immediately available for consultation/collaboration.   Dione Booze, MD 02/29/12 [phone]
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
## 2012-03-08 ENCOUNTER — Encounter (HOSPITAL_COMMUNITY): Payer: Self-pay | Admitting: Emergency Medicine

## 2012-03-08 ENCOUNTER — Emergency Department (HOSPITAL_COMMUNITY)
Admission: EM | Admit: 2012-03-08 | Discharge: 2012-03-08 | Disposition: A | Discharge status: Home / Self Care | Payer: Medicaid Other | Attending: Emergency Medicine | Admitting: Emergency Medicine

## 2012-03-08 DIAGNOSIS — G9332 Myalgic encephalomyelitis/chronic fatigue syndrome: Secondary | ICD-10-CM | POA: Insufficient documentation

## 2012-03-08 DIAGNOSIS — Z4800 Encounter for change or removal of nonsurgical wound dressing: Secondary | ICD-10-CM | POA: Insufficient documentation

## 2012-03-08 DIAGNOSIS — R5382 Chronic fatigue, unspecified: Secondary | ICD-10-CM | POA: Insufficient documentation

## 2012-03-08 DIAGNOSIS — Z4802 Encounter for removal of sutures: Secondary | ICD-10-CM

## 2012-03-08 DIAGNOSIS — F3289 Other specified depressive episodes: Secondary | ICD-10-CM | POA: Insufficient documentation

## 2012-03-08 DIAGNOSIS — F411 Generalized anxiety disorder: Secondary | ICD-10-CM | POA: Insufficient documentation

## 2012-03-08 DIAGNOSIS — R109 Unspecified abdominal pain: Secondary | ICD-10-CM | POA: Insufficient documentation

## 2012-03-08 DIAGNOSIS — K219 Gastro-esophageal reflux disease without esophagitis: Secondary | ICD-10-CM | POA: Insufficient documentation

## 2012-03-08 DIAGNOSIS — Z8701 Personal history of pneumonia (recurrent): Secondary | ICD-10-CM | POA: Insufficient documentation

## 2012-03-08 DIAGNOSIS — F209 Schizophrenia, unspecified: Secondary | ICD-10-CM | POA: Insufficient documentation

## 2012-03-08 DIAGNOSIS — Z8679 Personal history of other diseases of the circulatory system: Secondary | ICD-10-CM | POA: Insufficient documentation

## 2012-03-08 DIAGNOSIS — F17200 Nicotine dependence, unspecified, uncomplicated: Secondary | ICD-10-CM | POA: Insufficient documentation

## 2012-03-08 DIAGNOSIS — F319 Bipolar disorder, unspecified: Secondary | ICD-10-CM | POA: Insufficient documentation

## 2012-03-08 DIAGNOSIS — M549 Dorsalgia, unspecified: Secondary | ICD-10-CM | POA: Insufficient documentation

## 2012-03-08 DIAGNOSIS — F329 Major depressive disorder, single episode, unspecified: Secondary | ICD-10-CM | POA: Insufficient documentation

## 2012-03-08 DIAGNOSIS — Z8739 Personal history of other diseases of the musculoskeletal system and connective tissue: Secondary | ICD-10-CM | POA: Insufficient documentation

## 2012-03-08 DIAGNOSIS — G8929 Other chronic pain: Secondary | ICD-10-CM | POA: Insufficient documentation

## 2012-03-08 DIAGNOSIS — Z79899 Other long term (current) drug therapy: Secondary | ICD-10-CM | POA: Insufficient documentation

## 2012-03-08 MED ORDER — METHOCARBAMOL 500 MG PO TABS: ORAL_TABLET | ORAL | Status: DC
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
## 2012-03-08 NOTE — ED Notes (Signed)
Pt was seen here 10 days ago for laceration to left arm. Pt received sutures and given abx for tx. Pt is here for suture removal.

## 2012-03-09 NOTE — ED Provider Notes (Signed)
Medical screening examination/treatment/procedure(s) were performed by non-physician practitioner and as supervising physician I was immediately available for consultation/collaboration.   Shelda Jakes, MD 03/09/12 [phone]

## 2012-03-23 ENCOUNTER — Encounter: Payer: Medicaid Other | Attending: Nurse Practitioner | Admitting: *Deleted

## 2012-03-23 ENCOUNTER — Encounter: Payer: Self-pay | Admitting: *Deleted

## 2012-03-23 DIAGNOSIS — E669 Obesity, unspecified: Secondary | ICD-10-CM | POA: Insufficient documentation

## 2012-03-23 DIAGNOSIS — Z713 Dietary counseling and surveillance: Secondary | ICD-10-CM | POA: Insufficient documentation

## 2012-03-23 DIAGNOSIS — I10 Essential (primary) hypertension: Secondary | ICD-10-CM | POA: Insufficient documentation

## 2012-03-23 NOTE — Progress Notes (Signed)
  Medical Nutrition Therapy:  Appt start time: 0930 end time:  1000.  Assessment:  Primary concerns today: follow up visit for Obesity. States she is eating more vegetables and less meat, continues to eat 3 meals a day most days. Is avoiding higher fat meats like sausage and trims fat off of other meats like prime rib. She is looking forward to receiving her Christmas present of an exercise machine.  MEDICATIONS: see list.   DIETARY INTAKE:  Usual eating pattern includes 1 meals and 0-1 snacks per day.  Everyday foods include fried potatoes or fatty potatoes.  Avoided foods include regular soda, few noted otherwise.    24-hr recall:  B ( AM): Unsweetened cereal with 1% milk, OR scrambled eggs occasionally with out any fat back anymore OR package of crackers Snk ( AM): N/A  L ( PM):eating lunch occasionally now. Used to skip all the time Snk ( PM): N/A D ( PM): choosing leaner meats, fish more often, more vegetables including peas, corn. Pt notes former intake of salad that has discontinued. Snk ( PM): N/A Beverages: diet soda, water, crystal light  Usual physical activity: very limited due to pain. Max 5 minutes walking. Some playing with dogs.  Estimated energy needs: 1400 calories 158 g carbohydrates 105 g protein 39 g fat  Progress Towards Goal(s):  In progress.   Nutritional Diagnosis:  NI-1.5 Excessive energy intake As related to minimal activity, knowledge deficit.  As evidenced by BMI >30, reports of wt gain 2-3 lbs/wk..Current BMI is down to 32.2 today!    Intervention:  Again commended both patient and her fiancee on their positive behavior changes. Reinforced the value of eating 3 meals a day and reviewed how to make appropriate choices with desserts on occasion. Reviewed previous goals and she states she wants to continue the same goals at this time.  Goals:  Continue eating 3 times daily and consider aiming for 2-3 Carb Choices per meal  Continue including a  protein- eggs, peanut butter, milk, meat, etc. At each meal as able  Continue using the MyPlate at supper time. Plate should be half non-starchy veggies, one quarter starch, one quarter meat.  Aim for minimum 15 minutes of activity daily, spread out in 5 minute inncrements for pain concerns. Do enjoyable activity like playing with dogs, carrying wood up the hill, using new exercise machine or short walks.  Continue choosing less fried food in favor of baked or grilled, smaller portions of starches like potatoes, rinsing canned vegetables well to reduce salt content.  Consider reading food labels for total carbohydrate of foods as able.  Great job! Keep up the good work!   Monitoring/Evaluation:  Dietary intake, exercise, and body weight in 4 week(s).

## 2012-03-23 NOTE — Patient Instructions (Addendum)
Goals:  Continue eating 3 times daily and consider aiming for 2-3 Carb Choices per meal  Continue including a protein- eggs, peanut butter, milk, meat, etc. At each meal as able  Continue using the MyPlate at supper time. Plate should be half non-starchy veggies, one quarter starch, one quarter meat.  Aim for minimum 15 minutes of activity daily, spread out in 5 minute inncrements for pain concerns. Do enjoyable activity like playing with dogs, carrying wood up the hill, using new exercise machine or short walks.  Continue choosing less fried food in favor of baked or grilled, smaller portions of starches like potatoes, rinsing canned vegetables well to reduce salt content.  Consider reading food labels for total carbohydrate of foods as able.  Great job! Keep up the good work!

## 2012-04-02 ENCOUNTER — Encounter: Payer: Self-pay | Admitting: Physical Medicine and Rehabilitation

## 2012-04-02 ENCOUNTER — Encounter
Payer: Medicaid Other | Attending: Physical Medicine and Rehabilitation | Admitting: Physical Medicine and Rehabilitation

## 2012-04-02 VITALS — BP 145/86 | HR 83 | Resp 14 | Ht 64.0 in | Wt 190.2 lb

## 2012-04-02 DIAGNOSIS — M79609 Pain in unspecified limb: Secondary | ICD-10-CM | POA: Insufficient documentation

## 2012-04-02 DIAGNOSIS — M25569 Pain in unspecified knee: Secondary | ICD-10-CM | POA: Insufficient documentation

## 2012-04-02 DIAGNOSIS — R635 Abnormal weight gain: Secondary | ICD-10-CM | POA: Insufficient documentation

## 2012-04-02 DIAGNOSIS — R29898 Other symptoms and signs involving the musculoskeletal system: Secondary | ICD-10-CM | POA: Insufficient documentation

## 2012-04-02 DIAGNOSIS — M545 Low back pain, unspecified: Secondary | ICD-10-CM | POA: Insufficient documentation

## 2012-04-02 DIAGNOSIS — M47817 Spondylosis without myelopathy or radiculopathy, lumbosacral region: Secondary | ICD-10-CM

## 2012-04-02 DIAGNOSIS — G8929 Other chronic pain: Secondary | ICD-10-CM | POA: Insufficient documentation

## 2012-04-02 DIAGNOSIS — M47816 Spondylosis without myelopathy or radiculopathy, lumbar region: Secondary | ICD-10-CM

## 2012-04-02 DIAGNOSIS — F17200 Nicotine dependence, unspecified, uncomplicated: Secondary | ICD-10-CM | POA: Insufficient documentation

## 2012-04-02 DIAGNOSIS — K219 Gastro-esophageal reflux disease without esophagitis: Secondary | ICD-10-CM | POA: Insufficient documentation

## 2012-04-02 MED ORDER — GABAPENTIN 800 MG PO TABS: 800.0000 mg | ORAL_TABLET | Freq: Every day | ORAL | Status: DC

## 2012-04-02 MED ORDER — GABAPENTIN 300 MG PO CAPS: 300.0000 mg | ORAL_CAPSULE | Freq: Two times a day (BID) | ORAL | Status: DC

## 2012-04-02 MED ORDER — GABAPENTIN 300 MG PO CAPS: 600.0000 mg | ORAL_CAPSULE | Freq: Two times a day (BID) | ORAL | Status: DC

## 2012-04-02 NOTE — Patient Instructions (Signed)
Continue with your exercise device as pain permits.
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
## 2012-04-06 ENCOUNTER — Other Ambulatory Visit: Payer: Self-pay | Admitting: Gastroenterology

## 2012-04-06 ENCOUNTER — Telehealth: Payer: Self-pay | Admitting: *Deleted

## 2012-04-06 NOTE — Telephone Encounter (Signed)
Patient is requesting refill on Celebrex 200mg . Did not see documentation where patient is on medication. Please advise.

## 2012-04-09 ENCOUNTER — Telehealth: Payer: Self-pay

## 2012-04-09 NOTE — Telephone Encounter (Signed)
Calling to find out if any form has been filled out for her.  Advised her we have not received any prior authorization request at this point and she should have the pharmacy contact us.

## 2012-04-09 NOTE — Telephone Encounter (Signed)
Patient was taken off celebrex because of stomach problems, as far as I know,

## 2012-04-10 NOTE — Telephone Encounter (Signed)
Patient aware.

## 2012-04-23 ENCOUNTER — Encounter: Payer: Self-pay | Admitting: *Deleted

## 2012-04-23 ENCOUNTER — Encounter: Payer: Medicaid Other | Attending: Nurse Practitioner | Admitting: *Deleted

## 2012-04-23 DIAGNOSIS — I10 Essential (primary) hypertension: Secondary | ICD-10-CM | POA: Insufficient documentation

## 2012-04-23 DIAGNOSIS — Z713 Dietary counseling and surveillance: Secondary | ICD-10-CM | POA: Insufficient documentation

## 2012-04-23 DIAGNOSIS — E669 Obesity, unspecified: Secondary | ICD-10-CM | POA: Insufficient documentation

## 2012-04-23 NOTE — Progress Notes (Signed)
  Medical Nutrition Therapy:  Appt start time: 1500 end time:  1530.  Assessment:  Primary concerns today: follow up visit for Obesity. She is here with her fiancee again who continues to appear supportive. She is using her new exercise machine but some of the movements hurt her back. She does get some exercise by bringing in wood and walking to the mailbox that is 150 feet from their home. She states she is comfortable with the portion sizes we have discussed.  MEDICATIONS: see list.   DIETARY INTAKE:  Usual eating pattern includes 1 meals and 0-1 snacks per day.  Everyday foods include fried potatoes or fatty potatoes.  Avoided foods include regular soda, few noted otherwise.    24-hr recall:  B ( AM): Unsweetened cereal with 1% milk, OR scrambled eggs occasionally with out any fat back anymore OR package of crackers Snk ( AM): N/A  L ( PM):eating lunch occasionally now. Used to skip all the time Snk ( PM): N/A D ( PM): choosing leaner meats, fish more often, more vegetables including peas, corn. Pt notes former intake of salad that has discontinued. Snk ( PM): N/A Beverages: diet soda, water, crystal light  Usual physical activity: very limited due to pain. Max 5 minutes walking. Some playing with dogs.  Estimated energy needs: 1400 calories 158 g carbohydrates 105 g protein 39 g fat  Progress Towards Goal(s):  In progress.   Nutritional Diagnosis:  NI-1.5 Excessive energy intake As related to minimal activity, knowledge deficit.  As evidenced by BMI >30, reports of wt gain 2-3 lbs/wk..Current BMI is down to 32.2 today!    Intervention:  Spent today concentrating on ways for her to increase her activity level. Explained that it is the total time exercising and can be done in shorter increments throughout the day. Suggested both indoor and out door activities to incorporate into her daily living.    Again commended both patient and her fiancee on their positive behavior  changes. Reinforced the value of eating 3 meals a day and reviewed how to make appropriate choices with desserts on occasion. Reviewed previous goals and she states she wants to continue the same goals at this time. She wants to continue coming every 4 weeks for ongoing support.  Goals:  Continue eating 3 times daily and consider aiming for 2-3 Carb Choices per meal  Continue including a protein- eggs, peanut butter, milk, meat, etc. At each meal as able  Continue using the MyPlate at supper time. Plate should be half non-starchy veggies, one quarter starch, one quarter meat.  Continue choosing less fried food in favor of baked or grilled, smaller portions of starches like potatoes, rinsing canned vegetables well to reduce salt content.  Great job! Keep up the good work!  Consider ways to increase your activity level consistently for a total of 30 minutes every day. (arm chair exercises, walking to mail box, bringing wood inside from wood pile, dancing)  Consider alternating between diet Sun Drop and water every other glass  Monitoring/Evaluation:  Dietary intake, exercise, and body weight in 4 week(s).

## 2012-04-23 NOTE — Patient Instructions (Signed)
Goals:  Continue eating 3 times daily and consider aiming for 2-3 Carb Choices per meal  Continue including a protein- eggs, peanut butter, milk, meat, etc. At each meal as able  Continue using the MyPlate at supper time. Plate should be half non-starchy veggies, one quarter starch, one quarter meat.  Continue choosing less fried food in favor of baked or grilled, smaller portions of starches like potatoes, rinsing canned vegetables well to reduce salt content.  Great job! Keep up the good work!  Consider ways to increase your activity level consistently for a total of 30 minutes every day. (arm chair exercises, walking to mail box, bringing wood inside from wood pile, dancing)  Consider alternating between diet Sun Drop and water every other glass

## 2012-05-21 ENCOUNTER — Encounter: Payer: Medicaid Other | Attending: Nurse Practitioner | Admitting: *Deleted

## 2012-05-21 ENCOUNTER — Encounter: Payer: Self-pay | Admitting: *Deleted

## 2012-05-21 DIAGNOSIS — Z713 Dietary counseling and surveillance: Secondary | ICD-10-CM | POA: Insufficient documentation

## 2012-05-21 DIAGNOSIS — I10 Essential (primary) hypertension: Secondary | ICD-10-CM | POA: Insufficient documentation

## 2012-05-21 DIAGNOSIS — E669 Obesity, unspecified: Secondary | ICD-10-CM | POA: Insufficient documentation

## 2012-05-21 NOTE — Patient Instructions (Addendum)
Goals:  Continue eating 3 times daily and consider aiming for 2-3 Carb Choices per meal  Continue including a protein- eggs, peanut butter, milk, meat, etc. At each meal as able  Continue using the MyPlate at supper time. Plate should be half non-starchy veggies, one quarter starch, one quarter meat.  Continue choosing less fried food in favor of baked or grilled, smaller portions of starches like potatoes, rinsing canned vegetables well to reduce salt content.  Great job! Keep up the good work!  Consider ways to increase your activity level consistently for a total of 30 minutes every day. (arm chair exercises, walking to mail box, bringing wood inside from wood pile, dancing or The 7 Minute Workout APP)  Consider alternating between diet Sun Drop and water every other glass

## 2012-05-21 NOTE — Progress Notes (Signed)
  Medical Nutrition Therapy:  Appt start time: 1500 end time:  1530.  Assessment:  Primary concerns today: follow up visit for Obesity. She is here with her fiancee again who continues to appear supportive. She has been walking to the mail box frequently when weather permits, often several times a day. Continues to bring in wood for the fire too. They have modified their grocery list and are buying healthier choices. Tried a Healthy Choice meal and enjoyed it very much. She states the exercise machine her fiancee bought her is too hard on her back and the tension automatically gets stronger the longer she uses it so she cannot work on it more than about 3 minutes at a time.  MEDICATIONS: see list.   DIETARY INTAKE:  Usual eating pattern includes 1 meals and 0-1 snacks per day.  Everyday foods include fried potatoes or fatty potatoes.  Avoided foods include regular soda, few noted otherwise.    24-hr recall:  B ( AM):  scrambled eggs occasionally with out any fat back anymore OR package of crackers, Diet Sun Drop Snk ( AM): N/A  L ( PM):eating lunch occasionally now. Used to skip all the time Snk ( PM): N/A D ( PM): choosing leaner meats, fish more often, more vegetables including peas, corn. Pt notes former intake of salad that has discontinued. Snk ( PM): N/A Beverages: diet soda, water, crystal light  Usual physical activity:Has been able to walk further as in to the mail box which is over 150 yards from the house a few times a day when weather permits.  Estimated energy needs: 1400 calories 158 g carbohydrates 105 g protein 39 g fat  Progress Towards Goal(s):  In progress.   Nutritional Diagnosis:  NI-1.5 Excessive energy intake As related to minimal activity, knowledge deficit.  As evidenced by BMI >30, reports of wt gain 2-3 lbs/wk..Current BMI is down to 32.2 today!    Intervention:  Acknowledged that the new exercise machine gets too hard as time goes on, not working very  well. Discussed other options such as the 7 Minute Workout App or dancing to try on bad weather days. Also to continue walking as able outside. Spent today concentrating on ways for her to increase her activity level. Explained that it is the total time exercising and can be done in shorter increments throughout the day. Suggested both indoor and out door activities to incorporate into her daily living.    Goals:  Continue eating 3 times daily and consider aiming for 2-3 Carb Choices per meal  Continue including a protein- eggs, peanut butter, milk, meat, etc. At each meal as able  Continue using the MyPlate at supper time. Plate should be half non-starchy veggies, one quarter starch, one quarter meat.  Continue choosing less fried food in favor of baked or grilled, smaller portions of starches like potatoes, rinsing canned vegetables well to reduce salt content.  Great job! Keep up the good work!  Consider ways to increase your activity level consistently for a total of 30 minutes every day. (arm chair exercises, walking to mail box, bringing wood inside from wood pile, dancing)  Consider alternating between diet Sun Drop and water every other glass  Monitoring/Evaluation:  Dietary intake, exercise, and body weight in 4 week(s).

## 2012-05-29 ENCOUNTER — Encounter: Payer: Medicaid Other | Admitting: Physical Medicine and Rehabilitation

## 2012-06-04 ENCOUNTER — Encounter
Payer: Medicaid Other | Attending: Physical Medicine and Rehabilitation | Admitting: Physical Medicine and Rehabilitation

## 2012-06-04 ENCOUNTER — Encounter: Payer: Self-pay | Admitting: Physical Medicine and Rehabilitation

## 2012-06-04 VITALS — BP 111/67 | HR 86 | Resp 14 | Ht 64.0 in | Wt 191.0 lb

## 2012-06-04 DIAGNOSIS — F1121 Opioid dependence, in remission: Secondary | ICD-10-CM | POA: Insufficient documentation

## 2012-06-04 DIAGNOSIS — F17200 Nicotine dependence, unspecified, uncomplicated: Secondary | ICD-10-CM | POA: Insufficient documentation

## 2012-06-04 DIAGNOSIS — M25569 Pain in unspecified knee: Secondary | ICD-10-CM | POA: Insufficient documentation

## 2012-06-04 DIAGNOSIS — M79609 Pain in unspecified limb: Secondary | ICD-10-CM | POA: Insufficient documentation

## 2012-06-04 DIAGNOSIS — M47817 Spondylosis without myelopathy or radiculopathy, lumbosacral region: Secondary | ICD-10-CM

## 2012-06-04 DIAGNOSIS — K219 Gastro-esophageal reflux disease without esophagitis: Secondary | ICD-10-CM | POA: Insufficient documentation

## 2012-06-04 DIAGNOSIS — G8929 Other chronic pain: Secondary | ICD-10-CM | POA: Insufficient documentation

## 2012-06-04 DIAGNOSIS — F329 Major depressive disorder, single episode, unspecified: Secondary | ICD-10-CM | POA: Insufficient documentation

## 2012-06-04 DIAGNOSIS — M545 Low back pain, unspecified: Secondary | ICD-10-CM | POA: Insufficient documentation

## 2012-06-04 DIAGNOSIS — F3289 Other specified depressive episodes: Secondary | ICD-10-CM | POA: Insufficient documentation

## 2012-06-04 DIAGNOSIS — R635 Abnormal weight gain: Secondary | ICD-10-CM | POA: Insufficient documentation

## 2012-06-04 NOTE — Patient Instructions (Signed)
Try to look into exercising in the pool. Continue with trying to eat healthy.
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
## 2012-06-19 ENCOUNTER — Ambulatory Visit: Payer: Medicaid Other | Admitting: *Deleted

## 2012-07-03 ENCOUNTER — Other Ambulatory Visit: Payer: Self-pay | Admitting: Physical Medicine and Rehabilitation

## 2012-07-05 ENCOUNTER — Other Ambulatory Visit: Payer: Self-pay | Admitting: Physical Medicine and Rehabilitation

## 2012-07-19 ENCOUNTER — Ambulatory Visit: Payer: Medicaid Other | Admitting: *Deleted

## 2012-07-24 ENCOUNTER — Encounter: Payer: Self-pay | Admitting: *Deleted

## 2012-07-24 ENCOUNTER — Encounter: Payer: Medicaid Other | Attending: Nurse Practitioner | Admitting: *Deleted

## 2012-07-24 DIAGNOSIS — Z713 Dietary counseling and surveillance: Secondary | ICD-10-CM | POA: Insufficient documentation

## 2012-07-24 DIAGNOSIS — E669 Obesity, unspecified: Secondary | ICD-10-CM | POA: Insufficient documentation

## 2012-07-24 DIAGNOSIS — I10 Essential (primary) hypertension: Secondary | ICD-10-CM | POA: Insufficient documentation

## 2012-07-24 NOTE — Patient Instructions (Signed)
Goals:  Continue eating 3 times daily and consider aiming for 2 Carb Choices (30 grams) +/- per meal  Continue including a protein- eggs, peanut butter, milk, meat, etc. At each meal as able  Continue using the MyPlate at supper time. Plate should be half non-starchy veggies, one quarter starch, one quarter meat.  Continue choosing less fried food in favor of baked or grilled, smaller portions of starches like potatoes, rinsing canned vegetables well to reduce salt content.  Great job! Keep up the good work!  Consider increasing your activity level consistently for a total of 15 minutes every day. (arm chair exercises, walking to mail box, bringing wood inside from wood pile, dancing, riding your bike, or The 7 Minute Workout APP)

## 2012-07-24 NOTE — Progress Notes (Signed)
  Medical Nutrition Therapy:  Appt start time: 1600 end time:  1630.  Assessment:  Primary concerns today: follow up visit for Obesity. She is here with her fiancee again who continues to appear supportive. She is pleased that she just got her driver's license again, this was a 14 month process.  She is pleased with 2 pound weight loss. She states she is not exercising as much as she would like. She states she continues to eat breakfast, she is adding baked chicken to a salad for supper meal, occasionally a hamburger and lower fat fries at [employer] and they share 1 order. Still buying healthier foods including salads and vegetables on grocery list.  She is not exercising much because she doesn't feel she is making any progress.   MEDICATIONS: see list.   DIETARY INTAKE: Usual eating pattern includes 3 meals and 0-1 snacks per day.  Everyday foods include fried potatoes or fatty potatoes.  Avoided foods include regular soda, few noted otherwise.    24-hr recall:  B ( AM):  scrambled eggs occasionally with out any fat back anymore OR package of crackers, Diet Sun Drop Snk ( AM): N/A  L ( PM):eating lunch occasionally now. Used to skip all the time Snk ( PM): N/A D ( PM): choosing leaner meats, fish more often, more vegetables including peas, corn.Eating more salad now. Snk ( PM): N/A Beverages: diet soda, water, crystal light  Usual physical activity:Has been able to walk further as in to the mail box which is over 150 yards from the house a few times a day when weather permits.  Estimated energy needs: 1400 calories 158 g carbohydrates 105 g protein 39 g fat  Progress Towards Goal(s):  In progress.   Nutritional Diagnosis:  NI-1.5 Excessive energy intake As related to minimal activity, knowledge deficit.  As evidenced by BMI >30, reports of wt gain 2-3 lbs/wk..Current BMI is down to 31.9 today!    Intervention:  Discussed the importance of increasing her activity level today.  Explained that she needs to be active a minimum of 15 - 30 minutes a day, 5-7 days a week to see a consistent weight loss. Discussed options for increasing her activity such as riding her bike, The 7 Minute Workout, or use of her exercise machine on a more regular basis. Commended her on her continued healthier eating habits.   Goals:  Continue eating 3 times daily and consider aiming for 2 Carb Choices (30 grams) +/- per meal  Continue including a protein- eggs, peanut butter, milk, meat, etc. At each meal as able  Continue using the MyPlate at supper time. Plate should be half non-starchy veggies, one quarter starch, one quarter meat.  Continue choosing less fried food in favor of baked or grilled, smaller portions of starches like potatoes, rinsing canned vegetables well to reduce salt content.  Consider increasing your activity level consistently for a total of 15 minutes every day. (arm chair exercises, walking to mail box, bringing wood inside from wood pile, dancing, riding your bike, or The 7 Minute Workout APP)   Monitoring/Evaluation:  Dietary intake, exercise, and body weight in 4 week(s).

## 2012-07-27 ENCOUNTER — Other Ambulatory Visit: Payer: Self-pay | Admitting: Physical Medicine and Rehabilitation

## 2012-07-27 NOTE — Telephone Encounter (Signed)
Patient called request robaxin and gabapentin refill.  This was done.

## 2012-08-21 ENCOUNTER — Encounter: Payer: Medicaid Other | Attending: Nurse Practitioner | Admitting: *Deleted

## 2012-08-21 ENCOUNTER — Encounter: Payer: Self-pay | Admitting: *Deleted

## 2012-08-21 DIAGNOSIS — Z713 Dietary counseling and surveillance: Secondary | ICD-10-CM | POA: Insufficient documentation

## 2012-08-21 DIAGNOSIS — I10 Essential (primary) hypertension: Secondary | ICD-10-CM | POA: Insufficient documentation

## 2012-08-21 DIAGNOSIS — E669 Obesity, unspecified: Secondary | ICD-10-CM | POA: Insufficient documentation

## 2012-08-21 NOTE — Progress Notes (Signed)
  Medical Nutrition Therapy:  Appt start time: 1600 end time:  1630.  Assessment:  Primary concerns today: follow up visit for Obesity. She is here with her fiancee again who continues to appear supportive. States she is exercising more on her machine daily for the last 2-3 weeks and is fitting into clothes better. She is baking more often instead of frying. Fixing more vegetables and salads.  MEDICATIONS: see list.   DIETARY INTAKE: Usual eating pattern includes 3 meals and 0-1 snacks per day.  Everyday foods include fried potatoes or fatty potatoes.  Avoided foods include regular soda, few noted otherwise.    24-hr recall:  B ( AM):  scrambled eggs occasionally with out any fat back anymore OR package of crackers, Diet Sun Drop Snk ( AM): N/A  L ( PM):eating lunch occasionally now. Used to skip all the time Snk ( PM): N/A D ( PM): choosing leaner meats, fish more often, more vegetables including peas, corn.Eating more salad now. Snk ( PM): N/A Beverages: diet soda, water, crystal light  Usual physical activity:Has been able to walk further as in to the mail box which is over 150 yards from the house a few times a day when weather permits.She is using her exercise machine daily for about 800 steps  Estimated energy needs: 1400 calories 158 g carbohydrates 105 g protein 39 g fat  Progress Towards Goal(s):  In progress.   Nutritional Diagnosis:  NI-1.5 Excessive energy intake As related to minimal activity, knowledge deficit.  As evidenced by BMI >30, reports of wt gain 2-3 lbs/wk..Current BMI is down to 31.2 today!    Intervention:  Reviewed all the positive changes she has made and continues to make. Encouraged her to continue with her current activity level of a minimum of 15 - 30 minutes a day, 5-7 days a week to see a consistent weight loss.  Commended her on her continued healthier eating habits.   Goals:  Continue eating 3 times daily and consider aiming for 2 Carb Choices  (30 grams) +/- per meal  Continue including a protein- eggs, peanut butter, milk, meat, etc. At each meal as able  Continue using the MyPlate at supper time. Plate should be half non-starchy veggies, one quarter starch, one quarter meat.  Continue choosing less fried food in favor of baked or grilled, smaller portions of starches like potatoes, rinsing canned vegetables well to reduce salt content.  Continue with your current activity level consistently for a total of 15-30 minutes every day.   Monitoring/Evaluation:  Dietary intake, exercise, and body weight in 6 week(s).

## 2012-09-04 ENCOUNTER — Encounter
Payer: Medicaid Other | Attending: Physical Medicine and Rehabilitation | Admitting: Physical Medicine and Rehabilitation

## 2012-09-04 ENCOUNTER — Encounter: Payer: Self-pay | Admitting: Physical Medicine and Rehabilitation

## 2012-09-04 ENCOUNTER — Ambulatory Visit: Payer: Medicaid Other | Admitting: Physical Medicine and Rehabilitation

## 2012-09-04 VITALS — BP 104/66 | HR 94 | Resp 14 | Ht 63.0 in | Wt 190.8 lb

## 2012-09-04 DIAGNOSIS — M79609 Pain in unspecified limb: Secondary | ICD-10-CM | POA: Insufficient documentation

## 2012-09-04 DIAGNOSIS — R29898 Other symptoms and signs involving the musculoskeletal system: Secondary | ICD-10-CM | POA: Insufficient documentation

## 2012-09-04 DIAGNOSIS — M25569 Pain in unspecified knee: Secondary | ICD-10-CM | POA: Insufficient documentation

## 2012-09-04 DIAGNOSIS — M545 Low back pain, unspecified: Secondary | ICD-10-CM | POA: Insufficient documentation

## 2012-09-04 DIAGNOSIS — M47817 Spondylosis without myelopathy or radiculopathy, lumbosacral region: Secondary | ICD-10-CM

## 2012-09-04 DIAGNOSIS — G8929 Other chronic pain: Secondary | ICD-10-CM | POA: Insufficient documentation

## 2012-09-04 DIAGNOSIS — E669 Obesity, unspecified: Secondary | ICD-10-CM | POA: Insufficient documentation
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
## 2012-09-04 NOTE — Patient Instructions (Addendum)
Try to stay as active as pain permits, continue with your exercise program and with your healthy diet.

## 2012-09-13 ENCOUNTER — Emergency Department (HOSPITAL_COMMUNITY)
Admission: EM | Admit: 2012-09-13 | Discharge: 2012-09-13 | Disposition: A | Discharge status: Home / Self Care | Payer: Medicaid Other | Attending: Emergency Medicine | Admitting: Emergency Medicine

## 2012-09-13 ENCOUNTER — Encounter (HOSPITAL_COMMUNITY): Payer: Self-pay | Admitting: *Deleted

## 2012-09-13 DIAGNOSIS — F319 Bipolar disorder, unspecified: Secondary | ICD-10-CM | POA: Insufficient documentation

## 2012-09-13 DIAGNOSIS — M549 Dorsalgia, unspecified: Secondary | ICD-10-CM | POA: Insufficient documentation

## 2012-09-13 DIAGNOSIS — Z8701 Personal history of pneumonia (recurrent): Secondary | ICD-10-CM | POA: Insufficient documentation

## 2012-09-13 DIAGNOSIS — F411 Generalized anxiety disorder: Secondary | ICD-10-CM | POA: Insufficient documentation

## 2012-09-13 DIAGNOSIS — Z79899 Other long term (current) drug therapy: Secondary | ICD-10-CM | POA: Insufficient documentation

## 2012-09-13 DIAGNOSIS — L0390 Cellulitis, unspecified: Secondary | ICD-10-CM

## 2012-09-13 DIAGNOSIS — G8929 Other chronic pain: Secondary | ICD-10-CM | POA: Insufficient documentation

## 2012-09-13 DIAGNOSIS — M129 Arthropathy, unspecified: Secondary | ICD-10-CM | POA: Insufficient documentation

## 2012-09-13 DIAGNOSIS — Z8679 Personal history of other diseases of the circulatory system: Secondary | ICD-10-CM | POA: Insufficient documentation

## 2012-09-13 DIAGNOSIS — Z87828 Personal history of other (healed) physical injury and trauma: Secondary | ICD-10-CM | POA: Insufficient documentation

## 2012-09-13 DIAGNOSIS — F17200 Nicotine dependence, unspecified, uncomplicated: Secondary | ICD-10-CM | POA: Insufficient documentation

## 2012-09-13 DIAGNOSIS — L02214 Cutaneous abscess of groin: Secondary | ICD-10-CM

## 2012-09-13 DIAGNOSIS — IMO0001 Reserved for inherently not codable concepts without codable children: Secondary | ICD-10-CM | POA: Insufficient documentation

## 2012-09-13 DIAGNOSIS — Z791 Long term (current) use of non-steroidal anti-inflammatories (NSAID): Secondary | ICD-10-CM | POA: Insufficient documentation

## 2012-09-13 DIAGNOSIS — Z8659 Personal history of other mental and behavioral disorders: Secondary | ICD-10-CM | POA: Insufficient documentation

## 2012-09-13 DIAGNOSIS — K219 Gastro-esophageal reflux disease without esophagitis: Secondary | ICD-10-CM | POA: Insufficient documentation

## 2012-09-13 DIAGNOSIS — L03319 Cellulitis of trunk, unspecified: Secondary | ICD-10-CM | POA: Insufficient documentation

## 2012-09-13 DIAGNOSIS — F209 Schizophrenia, unspecified: Secondary | ICD-10-CM | POA: Insufficient documentation

## 2012-09-13 DIAGNOSIS — IMO0002 Reserved for concepts with insufficient information to code with codable children: Secondary | ICD-10-CM | POA: Insufficient documentation

## 2012-09-13 DIAGNOSIS — Z8669 Personal history of other diseases of the nervous system and sense organs: Secondary | ICD-10-CM | POA: Insufficient documentation

## 2012-09-13 DIAGNOSIS — L02219 Cutaneous abscess of trunk, unspecified: Secondary | ICD-10-CM | POA: Insufficient documentation

## 2012-09-13 MED ORDER — LIDOCAINE HCL (PF) 1 % IJ SOLN
30.0000 mL | Freq: Once | INTRAMUSCULAR | Status: DC
  Filled 2012-09-13: qty 30

## 2012-09-13 MED ORDER — HYDROCODONE-ACETAMINOPHEN 5-325 MG PO TABS: 1.0000 | ORAL_TABLET | ORAL | Status: DC | PRN

## 2012-09-13 MED ORDER — SULFAMETHOXAZOLE-TRIMETHOPRIM 800-160 MG PO TABS: 1.0000 | ORAL_TABLET | Freq: Two times a day (BID) | ORAL | Status: DC

## 2012-09-13 MED ORDER — LIDOCAINE HCL (PF) 1 % IJ SOLN
INTRAMUSCULAR | Status: AC
  Filled 2012-09-13: qty 10

## 2012-09-13 MED ORDER — HYDROCODONE-ACETAMINOPHEN 5-325 MG PO TABS: 2.0000 | ORAL_TABLET | ORAL | Status: DC | PRN

## 2012-09-13 MED ORDER — SULFAMETHOXAZOLE-TMP DS 800-160 MG PO TABS
1.0000 | ORAL_TABLET | Freq: Once | ORAL | Status: AC
  Administered 2012-09-13: 1 via ORAL
  Filled 2012-09-13: qty 1

## 2012-09-13 NOTE — ED Notes (Signed)
Pt with abscess to left inner upper thigh, first noted on Monday and has gotten worse, denies fever or N/V, pt admits to squeezing it
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
## 2012-09-15 NOTE — ED Provider Notes (Signed)
Medical screening examination/treatment/procedure(s) were performed by non-physician practitioner and as supervising physician I was immediately available for consultation/collaboration.  Emberli Ballester R. Thor Nannini, MD 09/15/12 2336 

## 2012-09-16 ENCOUNTER — Encounter (HOSPITAL_COMMUNITY): Payer: Self-pay

## 2012-09-16 ENCOUNTER — Emergency Department (HOSPITAL_COMMUNITY)
Admission: EM | Admit: 2012-09-16 | Discharge: 2012-09-16 | Disposition: A | Discharge status: Home / Self Care | Payer: Medicaid Other | Attending: Emergency Medicine | Admitting: Emergency Medicine

## 2012-09-16 DIAGNOSIS — Z8679 Personal history of other diseases of the circulatory system: Secondary | ICD-10-CM | POA: Insufficient documentation

## 2012-09-16 DIAGNOSIS — L02419 Cutaneous abscess of limb, unspecified: Secondary | ICD-10-CM | POA: Insufficient documentation

## 2012-09-16 DIAGNOSIS — Z79899 Other long term (current) drug therapy: Secondary | ICD-10-CM | POA: Insufficient documentation

## 2012-09-16 DIAGNOSIS — F319 Bipolar disorder, unspecified: Secondary | ICD-10-CM | POA: Insufficient documentation

## 2012-09-16 DIAGNOSIS — G8929 Other chronic pain: Secondary | ICD-10-CM | POA: Insufficient documentation

## 2012-09-16 DIAGNOSIS — IMO0002 Reserved for concepts with insufficient information to code with codable children: Secondary | ICD-10-CM | POA: Insufficient documentation

## 2012-09-16 DIAGNOSIS — Z8701 Personal history of pneumonia (recurrent): Secondary | ICD-10-CM | POA: Insufficient documentation

## 2012-09-16 DIAGNOSIS — L0291 Cutaneous abscess, unspecified: Secondary | ICD-10-CM

## 2012-09-16 DIAGNOSIS — Z87828 Personal history of other (healed) physical injury and trauma: Secondary | ICD-10-CM | POA: Insufficient documentation

## 2012-09-16 DIAGNOSIS — Z8739 Personal history of other diseases of the musculoskeletal system and connective tissue: Secondary | ICD-10-CM | POA: Insufficient documentation

## 2012-09-16 DIAGNOSIS — F411 Generalized anxiety disorder: Secondary | ICD-10-CM | POA: Insufficient documentation

## 2012-09-16 DIAGNOSIS — K219 Gastro-esophageal reflux disease without esophagitis: Secondary | ICD-10-CM | POA: Insufficient documentation

## 2012-09-16 DIAGNOSIS — F17200 Nicotine dependence, unspecified, uncomplicated: Secondary | ICD-10-CM | POA: Insufficient documentation

## 2012-09-16 DIAGNOSIS — Z8719 Personal history of other diseases of the digestive system: Secondary | ICD-10-CM | POA: Insufficient documentation

## 2012-09-16 DIAGNOSIS — F209 Schizophrenia, unspecified: Secondary | ICD-10-CM | POA: Insufficient documentation

## 2012-09-16 MED ORDER — LIDOCAINE HCL (PF) 2 % IJ SOLN
10.0000 mL | Freq: Once | INTRAMUSCULAR | Status: AC
  Administered 2012-09-16: 10 mL
  Filled 2012-09-16: qty 10

## 2012-09-16 MED ORDER — FLUCONAZOLE 100 MG PO TABS
200.0000 mg | ORAL_TABLET | Freq: Every day | ORAL | Status: DC
  Administered 2012-09-16: 200 mg via ORAL
  Filled 2012-09-16: qty 2

## 2012-09-16 MED ORDER — HYDROCODONE-ACETAMINOPHEN 5-325 MG PO TABS: 1.0000 | ORAL_TABLET | ORAL | Status: DC | PRN

## 2012-09-16 MED ORDER — LIDOCAINE HCL 2 % EX GEL
Freq: Once | CUTANEOUS | Status: AC
  Administered 2012-09-16: 20:00:00 via TOPICAL

## 2012-09-16 MED ORDER — LIDOCAINE VISCOUS 2 % MT SOLN
OROMUCOSAL | Status: AC
  Filled 2012-09-16: qty 15
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
## 2012-09-16 NOTE — ED Notes (Signed)
Abscess to inner thigh that was lanced and drained the other day. Told to come back if it did not drain right per pt.

## 2012-09-17 NOTE — ED Provider Notes (Signed)
Medical screening examination/treatment/procedure(s) were performed by non-physician practitioner and as supervising physician I was immediately available for consultation/collaboration.  Saory Carriero M Urban Naval, MD 09/17/12 0324 

## 2012-09-18 ENCOUNTER — Inpatient Hospital Stay (HOSPITAL_COMMUNITY)
Admission: EM | Admit: 2012-09-18 | Discharge: 2012-09-21 | DRG: 603 | Disposition: A | Discharge status: Home / Self Care | Payer: Medicaid Other | Attending: Internal Medicine | Admitting: Internal Medicine

## 2012-09-18 ENCOUNTER — Encounter (HOSPITAL_COMMUNITY): Payer: Self-pay | Admitting: Emergency Medicine

## 2012-09-18 DIAGNOSIS — R0609 Other forms of dyspnea: Secondary | ICD-10-CM

## 2012-09-18 DIAGNOSIS — K5900 Constipation, unspecified: Secondary | ICD-10-CM

## 2012-09-18 DIAGNOSIS — E861 Hypovolemia: Secondary | ICD-10-CM | POA: Diagnosis present

## 2012-09-18 DIAGNOSIS — K838 Other specified diseases of biliary tract: Secondary | ICD-10-CM | POA: Diagnosis present

## 2012-09-18 DIAGNOSIS — Z79899 Other long term (current) drug therapy: Secondary | ICD-10-CM

## 2012-09-18 DIAGNOSIS — F319 Bipolar disorder, unspecified: Secondary | ICD-10-CM | POA: Diagnosis present

## 2012-09-18 DIAGNOSIS — R140 Abdominal distension (gaseous): Secondary | ICD-10-CM

## 2012-09-18 DIAGNOSIS — R3989 Other symptoms and signs involving the genitourinary system: Secondary | ICD-10-CM | POA: Diagnosis present

## 2012-09-18 DIAGNOSIS — F200 Paranoid schizophrenia: Secondary | ICD-10-CM | POA: Diagnosis present

## 2012-09-18 DIAGNOSIS — R030 Elevated blood-pressure reading, without diagnosis of hypertension: Secondary | ICD-10-CM

## 2012-09-18 DIAGNOSIS — F411 Generalized anxiety disorder: Secondary | ICD-10-CM | POA: Diagnosis present

## 2012-09-18 DIAGNOSIS — I10 Essential (primary) hypertension: Secondary | ICD-10-CM | POA: Diagnosis present

## 2012-09-18 DIAGNOSIS — M545 Low back pain, unspecified: Secondary | ICD-10-CM | POA: Diagnosis present

## 2012-09-18 DIAGNOSIS — R5382 Chronic fatigue, unspecified: Secondary | ICD-10-CM | POA: Diagnosis present

## 2012-09-18 DIAGNOSIS — G8929 Other chronic pain: Secondary | ICD-10-CM | POA: Diagnosis present

## 2012-09-18 DIAGNOSIS — L03119 Cellulitis of unspecified part of limb: Secondary | ICD-10-CM | POA: Diagnosis present

## 2012-09-18 DIAGNOSIS — K219 Gastro-esophageal reflux disease without esophagitis: Secondary | ICD-10-CM | POA: Diagnosis present

## 2012-09-18 DIAGNOSIS — M51379 Other intervertebral disc degeneration, lumbosacral region without mention of lumbar back pain or lower extremity pain: Secondary | ICD-10-CM | POA: Diagnosis present

## 2012-09-18 DIAGNOSIS — L0291 Cutaneous abscess, unspecified: Secondary | ICD-10-CM

## 2012-09-18 DIAGNOSIS — R079 Chest pain, unspecified: Secondary | ICD-10-CM

## 2012-09-18 DIAGNOSIS — E669 Obesity, unspecified: Secondary | ICD-10-CM | POA: Diagnosis present

## 2012-09-18 DIAGNOSIS — F17200 Nicotine dependence, unspecified, uncomplicated: Secondary | ICD-10-CM | POA: Diagnosis present

## 2012-09-18 DIAGNOSIS — M47816 Spondylosis without myelopathy or radiculopathy, lumbar region: Secondary | ICD-10-CM

## 2012-09-18 DIAGNOSIS — M5137 Other intervertebral disc degeneration, lumbosacral region: Secondary | ICD-10-CM | POA: Diagnosis present

## 2012-09-18 DIAGNOSIS — Z8249 Family history of ischemic heart disease and other diseases of the circulatory system: Secondary | ICD-10-CM

## 2012-09-18 DIAGNOSIS — R109 Unspecified abdominal pain: Secondary | ICD-10-CM

## 2012-09-18 DIAGNOSIS — E66811 Obesity, class 1: Secondary | ICD-10-CM | POA: Diagnosis present

## 2012-09-18 DIAGNOSIS — Z720 Tobacco use: Secondary | ICD-10-CM

## 2012-09-18 DIAGNOSIS — E663 Overweight: Secondary | ICD-10-CM | POA: Insufficient documentation

## 2012-09-18 DIAGNOSIS — L03116 Cellulitis of left lower limb: Secondary | ICD-10-CM | POA: Diagnosis present

## 2012-09-18 DIAGNOSIS — IMO0001 Reserved for inherently not codable concepts without codable children: Secondary | ICD-10-CM | POA: Diagnosis present

## 2012-09-18 DIAGNOSIS — Z6832 Body mass index (BMI) 32.0-32.9, adult: Secondary | ICD-10-CM

## 2012-09-18 DIAGNOSIS — G9332 Myalgic encephalomyelitis/chronic fatigue syndrome: Secondary | ICD-10-CM | POA: Diagnosis present

## 2012-09-18 DIAGNOSIS — N179 Acute kidney failure, unspecified: Secondary | ICD-10-CM | POA: Diagnosis present

## 2012-09-18 DIAGNOSIS — L02419 Cutaneous abscess of limb, unspecified: Principal | ICD-10-CM | POA: Diagnosis present

## 2012-09-18 DIAGNOSIS — E871 Hypo-osmolality and hyponatremia: Secondary | ICD-10-CM | POA: Diagnosis present

## 2012-09-18 DIAGNOSIS — D7589 Other specified diseases of blood and blood-forming organs: Secondary | ICD-10-CM | POA: Diagnosis present

## 2012-09-18 DIAGNOSIS — R002 Palpitations: Secondary | ICD-10-CM

## 2012-09-18 HISTORY — DX: Other specified diseases of biliary tract: K83.8

## 2012-09-18 LAB — CBC WITH DIFFERENTIAL/PLATELET
Basophils Absolute: 0 10*3/uL (ref 0.0–0.1)
Eosinophils Relative: 1 % (ref 0–5)
HCT: 38.4 % (ref 36.0–46.0)
Lymphocytes Relative: 10 % — ABNORMAL LOW (ref 12–46)
Lymphs Abs: 1.3 10*3/uL (ref 0.7–4.0)
MCV: 99.7 fL (ref 78.0–100.0)
Neutro Abs: 11.4 10*3/uL — ABNORMAL HIGH (ref 1.7–7.7)
Platelets: 295 10*3/uL (ref 150–400)
RBC: 3.85 MIL/uL — ABNORMAL LOW (ref 3.87–5.11)
WBC: 13.9 10*3/uL — ABNORMAL HIGH (ref 4.0–10.5)

## 2012-09-18 LAB — BASIC METABOLIC PANEL
CO2: 24 mEq/L (ref 19–32)
Calcium: 8.8 mg/dL (ref 8.4–10.5)
Chloride: 91 mEq/L — ABNORMAL LOW (ref 96–112)
Glucose, Bld: 106 mg/dL — ABNORMAL HIGH (ref 70–99)
Sodium: 127 mEq/L — ABNORMAL LOW (ref 135–145)

## 2012-09-18 MED ORDER — LUBIPROSTONE 24 MCG PO CAPS
24.0000 ug | ORAL_CAPSULE | Freq: Two times a day (BID) | ORAL | Status: DC
  Administered 2012-09-18 – 2012-09-21 (×6): 24 ug via ORAL
  Filled 2012-09-18 (×12): qty 1

## 2012-09-18 MED ORDER — VANCOMYCIN HCL IN DEXTROSE 750-5 MG/150ML-% IV SOLN
750.0000 mg | Freq: Two times a day (BID) | INTRAVENOUS | Status: DC
  Administered 2012-09-19: 750 mg via INTRAVENOUS
  Filled 2012-09-18: qty 150

## 2012-09-18 MED ORDER — GABAPENTIN 100 MG PO CAPS
ORAL_CAPSULE | ORAL | Status: AC
  Filled 2012-09-18: qty 2

## 2012-09-18 MED ORDER — DOXEPIN HCL 25 MG PO CAPS
50.0000 mg | ORAL_CAPSULE | Freq: Every day | ORAL | Status: DC
  Administered 2012-09-18: 50 mg via ORAL
  Filled 2012-09-18: qty 2

## 2012-09-18 MED ORDER — VANCOMYCIN HCL IN DEXTROSE 1-5 GM/200ML-% IV SOLN
1000.0000 mg | Freq: Once | INTRAVENOUS | Status: AC
  Administered 2012-09-18: 1000 mg via INTRAVENOUS
  Filled 2012-09-18: qty 200

## 2012-09-18 MED ORDER — LURASIDONE HCL 60 MG PO TABS: 60.0000 mg | ORAL_TABLET | Freq: Every day | ORAL | Status: DC

## 2012-09-18 MED ORDER — LUBIPROSTONE 24 MCG PO CAPS
ORAL_CAPSULE | ORAL | Status: AC
  Filled 2012-09-18: qty 1

## 2012-09-18 MED ORDER — FLUTICASONE PROPIONATE HFA 44 MCG/ACT IN AERO
2.0000 | INHALATION_SPRAY | Freq: Two times a day (BID) | RESPIRATORY_TRACT | Status: DC
  Administered 2012-09-19 – 2012-09-21 (×5): 2 via RESPIRATORY_TRACT
  Filled 2012-09-18: qty 10.6

## 2012-09-18 MED ORDER — BUPROPION HCL ER (XL) 150 MG PO TB24
ORAL_TABLET | ORAL | Status: AC
  Filled 2012-09-18: qty 2

## 2012-09-18 MED ORDER — TOPIRAMATE 25 MG PO CPSP
25.0000 mg | ORAL_CAPSULE | Freq: Three times a day (TID) | ORAL | Status: DC
  Administered 2012-09-18 – 2012-09-19 (×2): 25 mg via ORAL
  Filled 2012-09-18 (×8): qty 1

## 2012-09-18 MED ORDER — HYDROMORPHONE HCL PF 1 MG/ML IJ SOLN
1.0000 mg | Freq: Once | INTRAMUSCULAR | Status: AC
  Administered 2012-09-18: 1 mg via INTRAVENOUS
  Filled 2012-09-18: qty 1

## 2012-09-18 MED ORDER — SODIUM CHLORIDE 0.9 % IV SOLN
INTRAVENOUS | Status: DC
  Administered 2012-09-18: 1 mL via INTRAVENOUS

## 2012-09-18 MED ORDER — TOPIRAMATE 25 MG PO TABS
ORAL_TABLET | ORAL | Status: AC
  Filled 2012-09-18: qty 1

## 2012-09-18 MED ORDER — GABAPENTIN 300 MG PO CAPS
ORAL_CAPSULE | ORAL | Status: AC
  Filled 2012-09-18: qty 2

## 2012-09-18 MED ORDER — GABAPENTIN 300 MG PO CAPS: 600.0000 mg | ORAL_CAPSULE | Freq: Two times a day (BID) | ORAL | Status: DC

## 2012-09-18 MED ORDER — ALPRAZOLAM 1 MG PO TABS
1.0000 mg | ORAL_TABLET | Freq: Three times a day (TID) | ORAL | Status: DC
  Administered 2012-09-18 – 2012-09-21 (×8): 1 mg via ORAL
  Filled 2012-09-18 (×8): qty 1

## 2012-09-18 MED ORDER — SODIUM CHLORIDE 0.9 % IV SOLN: INTRAVENOUS | Status: DC

## 2012-09-18 MED ORDER — TRAZODONE HCL 50 MG PO TABS
150.0000 mg | ORAL_TABLET | Freq: Every day | ORAL | Status: DC
  Administered 2012-09-18 – 2012-09-20 (×3): 150 mg via ORAL
  Filled 2012-09-18 (×3): qty 3

## 2012-09-18 MED ORDER — FLEET ENEMA 7-19 GM/118ML RE ENEM: 1.0000 | ENEMA | Freq: Once | RECTAL | Status: AC | PRN

## 2012-09-18 MED ORDER — BUPROPION HCL ER (XL) 150 MG PO TB24
300.0000 mg | ORAL_TABLET | Freq: Every day | ORAL | Status: DC
  Administered 2012-09-18 – 2012-09-21 (×4): 300 mg via ORAL
  Filled 2012-09-18 (×7): qty 2

## 2012-09-18 MED ORDER — FLUCONAZOLE 100 MG PO TABS
100.0000 mg | ORAL_TABLET | Freq: Every day | ORAL | Status: DC
  Administered 2012-09-19 – 2012-09-21 (×3): 100 mg via ORAL
  Filled 2012-09-18 (×3): qty 1

## 2012-09-18 MED ORDER — ACETAMINOPHEN 500 MG PO TABS
500.0000 mg | ORAL_TABLET | Freq: Four times a day (QID) | ORAL | Status: DC | PRN
  Administered 2012-09-18: 500 mg via ORAL
  Filled 2012-09-18: qty 1

## 2012-09-18 MED ORDER — DICLOFENAC SODIUM 1 % TD GEL
4.0000 g | Freq: Four times a day (QID) | TRANSDERMAL | Status: DC
  Filled 2012-09-18: qty 100

## 2012-09-18 MED ORDER — PANTOPRAZOLE SODIUM 40 MG PO TBEC
40.0000 mg | DELAYED_RELEASE_TABLET | Freq: Every day | ORAL | Status: DC
  Administered 2012-09-19 – 2012-09-21 (×3): 40 mg via ORAL
  Filled 2012-09-18 (×3): qty 1

## 2012-09-18 MED ORDER — GABAPENTIN 300 MG PO CAPS
600.0000 mg | ORAL_CAPSULE | Freq: Two times a day (BID) | ORAL | Status: DC
  Administered 2012-09-19: 600 mg via ORAL
  Filled 2012-09-18 (×2): qty 2

## 2012-09-18 MED ORDER — GABAPENTIN 800 MG PO TABS
800.0000 mg | ORAL_TABLET | Freq: Every day | ORAL | Status: DC
  Administered 2012-09-18: 800 mg via ORAL
  Filled 2012-09-18: qty 1

## 2012-09-18 MED ORDER — ENOXAPARIN SODIUM 40 MG/0.4ML ~~LOC~~ SOLN
40.0000 mg | SUBCUTANEOUS | Status: DC
  Administered 2012-09-18 – 2012-09-20 (×3): 40 mg via SUBCUTANEOUS
  Filled 2012-09-18 (×3): qty 0.4

## 2012-09-18 MED ORDER — IPRATROPIUM-ALBUTEROL 18-103 MCG/ACT IN AERO: 2.0000 | INHALATION_SPRAY | Freq: Four times a day (QID) | RESPIRATORY_TRACT | Status: DC | PRN

## 2012-09-18 MED ORDER — VILAZODONE HCL 40 MG PO TABS
40.0000 mg | ORAL_TABLET | ORAL | Status: DC
  Administered 2012-09-19: 40 mg via ORAL

## 2012-09-18 MED ORDER — PIPERACILLIN-TAZOBACTAM 3.375 G IVPB
3.3750 g | Freq: Three times a day (TID) | INTRAVENOUS | Status: DC
  Administered 2012-09-19 – 2012-09-21 (×8): 3.375 g via INTRAVENOUS
  Filled 2012-09-18 (×17): qty 50

## 2012-09-18 MED ORDER — DICLOFENAC SODIUM 1 % TD GEL
TRANSDERMAL | Status: AC
  Filled 2012-09-18: qty 100

## 2012-09-18 MED ORDER — ONDANSETRON HCL 4 MG/2ML IJ SOLN
4.0000 mg | INTRAMUSCULAR | Status: DC | PRN
  Filled 2012-09-18: qty 2

## 2012-09-18 MED ORDER — CHLORPROMAZINE HCL 100 MG PO TABS
100.0000 mg | ORAL_TABLET | Freq: Every day | ORAL | Status: DC
  Administered 2012-09-19: 100 mg via ORAL
  Filled 2012-09-18: qty 1

## 2012-09-18 MED ORDER — RISAQUAD PO CAPS
2.0000 | ORAL_CAPSULE | Freq: Every day | ORAL | Status: DC
  Administered 2012-09-19 – 2012-09-21 (×3): 2 via ORAL
  Filled 2012-09-18 (×3): qty 2

## 2012-09-18 NOTE — ED Notes (Signed)
Pt has abscess to left inner thigh x 12 days. Pt seen in ED on Sunday- abscess lanced. Pt states she removed packing today-bloody drainage to packing, no other drainage. Pt c/o continued pain and pressure.

## 2012-09-18 NOTE — ED Provider Notes (Signed)
Medical screening examination/treatment/procedure(s) were conducted as a shared visit with non-physician practitioner(s) and myself.  I personally evaluated the patient during the encounter. Patient with cellulitis previously drained abscess. The infectious continued to increase. No clear drainable abscess at this time. She'll be admitted to internal medicine  Meredith [name]. Rubin Payor, MD 09/18/12 [phone]

## 2012-09-18 NOTE — ED Notes (Signed)
Dr. Karilyn Cota paged again.
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
## 2012-09-18 NOTE — ED Notes (Signed)
Transferred to room 11 for probable adm
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
## 2012-09-18 NOTE — ED Notes (Signed)
Here for recheck after I and D of abscess of lt thigh .
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
## 2012-09-18 NOTE — ED Notes (Signed)
Attempted to call report, nurse to call back when able 

## 2012-09-19 ENCOUNTER — Encounter (HOSPITAL_COMMUNITY): Payer: Self-pay | Admitting: Internal Medicine

## 2012-09-19 DIAGNOSIS — N179 Acute kidney failure, unspecified: Secondary | ICD-10-CM | POA: Diagnosis present

## 2012-09-19 DIAGNOSIS — E871 Hypo-osmolality and hyponatremia: Secondary | ICD-10-CM

## 2012-09-19 DIAGNOSIS — L02419 Cutaneous abscess of limb, unspecified: Secondary | ICD-10-CM | POA: Diagnosis present

## 2012-09-19 LAB — CBC
MCH: 34 pg (ref 26.0–34.0)
MCHC: 33.7 g/dL (ref 30.0–36.0)
MCV: 100.8 fL — ABNORMAL HIGH (ref 78.0–100.0)
Platelets: 298 10*3/uL (ref 150–400)
RDW: 14.3 % (ref 11.5–15.5)
WBC: 7.4 10*3/uL (ref 4.0–10.5)

## 2012-09-19 LAB — COMPREHENSIVE METABOLIC PANEL
AST: 15 U/L (ref 0–37)
Albumin: 2.8 g/dL — ABNORMAL LOW (ref 3.5–5.2)
Calcium: 9 mg/dL (ref 8.4–10.5)
Chloride: 100 mEq/L (ref 96–112)
Creatinine, Ser: 0.96 mg/dL (ref 0.50–1.10)
Total Protein: 6.3 g/dL (ref 6.0–8.3)

## 2012-09-19 MED ORDER — PIPERACILLIN-TAZOBACTAM 3.375 G IVPB
INTRAVENOUS | Status: AC
  Filled 2012-09-19: qty 50

## 2012-09-19 MED ORDER — TOPIRAMATE 25 MG PO TABS
25.0000 mg | ORAL_TABLET | Freq: Every day | ORAL | Status: DC
  Administered 2012-09-20 – 2012-09-21 (×2): 25 mg via ORAL
  Filled 2012-09-19 (×5): qty 1

## 2012-09-19 MED ORDER — LURASIDONE HCL 40 MG PO TABS
60.0000 mg | ORAL_TABLET | Freq: Every day | ORAL | Status: DC
  Administered 2012-09-19 – 2012-09-20 (×2): 60 mg via ORAL
  Filled 2012-09-19 (×5): qty 2

## 2012-09-19 MED ORDER — VANCOMYCIN HCL IN DEXTROSE 750-5 MG/150ML-% IV SOLN
INTRAVENOUS | Status: AC
  Filled 2012-09-19: qty 150

## 2012-09-19 MED ORDER — POTASSIUM CHLORIDE IN NACL 20-0.9 MEQ/L-% IV SOLN
INTRAVENOUS | Status: DC
  Administered 2012-09-20: 06:00:00 via INTRAVENOUS

## 2012-09-19 MED ORDER — IPRATROPIUM-ALBUTEROL 20-100 MCG/ACT IN AERS: 2.0000 | INHALATION_SPRAY | Freq: Four times a day (QID) | RESPIRATORY_TRACT | Status: DC | PRN

## 2012-09-19 MED ORDER — TOPIRAMATE 25 MG PO TABS
25.0000 mg | ORAL_TABLET | Freq: Three times a day (TID) | ORAL | Status: DC
  Filled 2012-09-19 (×7): qty 1

## 2012-09-19 MED ORDER — LIOTHYRONINE SODIUM 25 MCG PO TABS
25.0000 ug | ORAL_TABLET | Freq: Every day | ORAL | Status: DC
  Administered 2012-09-19 – 2012-09-21 (×3): 25 ug via ORAL
  Filled 2012-09-19 (×6): qty 1

## 2012-09-19 MED ORDER — HYDROCODONE-ACETAMINOPHEN 5-325 MG PO TABS
1.0000 | ORAL_TABLET | ORAL | Status: DC | PRN
  Administered 2012-09-19 – 2012-09-21 (×9): 1 via ORAL
  Filled 2012-09-19 (×9): qty 1

## 2012-09-19 MED ORDER — DOXEPIN HCL 25 MG PO CAPS
25.0000 mg | ORAL_CAPSULE | Freq: Every day | ORAL | Status: DC
  Administered 2012-09-19 – 2012-09-20 (×2): 25 mg via ORAL
  Filled 2012-09-19 (×2): qty 1

## 2012-09-19 MED ORDER — VANCOMYCIN HCL IN DEXTROSE 1-5 GM/200ML-% IV SOLN
1000.0000 mg | Freq: Two times a day (BID) | INTRAVENOUS | Status: DC
  Administered 2012-09-19 – 2012-09-21 (×4): 1000 mg via INTRAVENOUS
  Filled 2012-09-19 (×10): qty 200

## 2012-09-19 MED ORDER — NICOTINE 21 MG/24HR TD PT24: 21.0000 mg | MEDICATED_PATCH | Freq: Every day | TRANSDERMAL | Status: DC | PRN

## 2012-09-19 MED ORDER — CHLORPROMAZINE HCL 100 MG PO TABS
50.0000 mg | ORAL_TABLET | Freq: Every day | ORAL | Status: DC
  Administered 2012-09-20 – 2012-09-21 (×2): 50 mg via ORAL
  Filled 2012-09-19 (×2): qty 1

## 2012-09-19 MED ORDER — VILAZODONE HCL 40 MG PO TABS
40.0000 mg | ORAL_TABLET | Freq: Every day | ORAL | Status: DC
  Administered 2012-09-20 – 2012-09-21 (×2): 40 mg via ORAL
  Filled 2012-09-19 (×5): qty 1

## 2012-09-19 MED ORDER — GABAPENTIN 400 MG PO CAPS
800.0000 mg | ORAL_CAPSULE | Freq: Every day | ORAL | Status: DC
  Administered 2012-09-19 – 2012-09-20 (×2): 800 mg via ORAL
  Filled 2012-09-19 (×2): qty 2

## 2012-09-19 MED ORDER — GABAPENTIN 300 MG PO CAPS
600.0000 mg | ORAL_CAPSULE | Freq: Every day | ORAL | Status: DC
  Administered 2012-09-20 – 2012-09-21 (×2): 600 mg via ORAL
  Filled 2012-09-19 (×2): qty 2

## 2012-09-19 NOTE — Care Management Note (Signed)
    Page 1 of 1   09/21/2012     1:37:21 PM   CARE MANAGEMENT NOTE 09/21/2012  Patient:  Meredith [name], Meredith [name]   Account Number:  [id number]  Date Initiated:  09/19/2012  Documentation initiated by:  Sharrie Rothman  Subjective/Objective Assessment:   Pt admitted from home with thigh cellulitis. Pt lives with her significant other and will return home at discharge. Pt is independent with ADL's.     Action/Plan:   No CM needs noted.   Anticipated DC Date:  09/22/2012   Anticipated DC Plan:  HOME/SELF CARE      DC Planning Services  CM consult      Choice offered to / List presented to:             Status of service:  Completed, signed off Medicare Important Message given?   (If response is "NO", the following Medicare IM given date fields will be blank) Date Medicare IM given:   Date Additional Medicare IM given:    Discharge Disposition:  HOME/SELF CARE  Per UR Regulation:    If discussed at Long Length of Stay Meetings, dates discussed:    Comments:  09/21/12 1335 Arlyss Queen, RN BSN CM Pt discharged home today. No CM needs noted.  09/19/12 1050 Arlyss Queen, RN BSN CM

## 2012-09-19 NOTE — Progress Notes (Signed)
UR chart review completed.  

## 2012-09-19 NOTE — Progress Notes (Signed)
Pt BP 88/55. MD notified. No new orders. Pt asymptomatic. Will continue to monitor.

## 2012-09-19 NOTE — Progress Notes (Signed)
Subjective: The patient complains of thigh pain and says Tylenol is not helping much. She did not get much sleep last night. Otherwise, no complaints.  Objective: Vital signs in last 24 hours: Filed Vitals:   09/19/12 0522 09/19/12 0731 09/19/12 0748 09/19/12 0951  BP: 108/72  108/72 110/71  Pulse: 93  73 83  Temp: 97.6 F (36.4 C)  97.6 F (36.4 C) 98.3 F (36.8 C)  TempSrc: Oral   Oral  Resp: 18  14 16   Height:      Weight:      SpO2: 96% 95% 95% 94%    Intake/Output Summary (Last 24 hours) at 09/19/12 1145 Last data filed at 09/19/12 0830  Gross per 24 hour  Intake    240 ml  Output      0 ml  Net    240 ml    Weight change:   Physical exam: General: 47 year old Caucasian woman laying in bed, in no acute distress. Lungs: Clear to auscultation bilaterally. Heart: S1, S2, with no murmurs rubs gallops. Abdomen: Positive bowel sounds, soft, nontender, nondistended. Extremities: Left inner thigh with indurated area approximately 3-4 cm, slightly erythema, and 1 cm surgical excision opening with no active drainage or bleeding. Mildly tender to palpation. Pedal pulses palpable. No pedal edema. Psychiatric/neurologic: She is alert and oriented x3. Her speech is clear. Cranial nerves II through XII are grossly intact.  Lab Results: Basic Metabolic Panel:  Recent Labs  45/40/98 1710 09/19/12 0502  NA 127* 135  K 3.9 3.8  CL 91* 100  CO2 24 26  GLUCOSE 106* 125*  BUN 19 16  CREATININE 1.56* 0.96  CALCIUM 8.8 9.0   Liver Function Tests:  Recent Labs  09/19/12 0502  AST 15  ALT 16  ALKPHOS 82  BILITOT 0.1*  PROT 6.3  ALBUMIN 2.8*   No results found for this basename: LIPASE, AMYLASE,  in the last 72 hours No results found for this basename: AMMONIA,  in the last 72 hours CBC:  Recent Labs  09/18/12 1710 09/19/12 0502  WBC 13.9* 7.4  NEUTROABS 11.4*  --   HGB 13.0 12.4  HCT 38.4 36.8  MCV 99.7 100.8*  PLT 295 298   Cardiac Enzymes: No results  found for this basename: CKTOTAL, CKMB, CKMBINDEX, TROPONINI,  in the last 72 hours BNP: No results found for this basename: PROBNP,  in the last 72 hours D-Dimer: No results found for this basename: DDIMER,  in the last 72 hours CBG: No results found for this basename: GLUCAP,  in the last 72 hours Hemoglobin A1C: No results found for this basename: HGBA1C,  in the last 72 hours Fasting Lipid Panel: No results found for this basename: CHOL, HDL, LDLCALC, TRIG, CHOLHDL, LDLDIRECT,  in the last 72 hours Thyroid Function Tests: No results found for this basename: TSH, T4TOTAL, FREET4, T3FREE, THYROIDAB,  in the last 72 hours Anemia Panel: No results found for this basename: VITAMINB12, FOLATE, FERRITIN, TIBC, IRON, RETICCTPCT,  in the last 72 hours Coagulation: No results found for this basename: LABPROT, INR,  in the last 72 hours Urine Drug Screen: Drugs of Abuse     Component Value Date/Time   LABOPIA NONE DETECTED 11/08/2011 2245   COCAINSCRNUR NONE DETECTED 11/08/2011 2245   LABBENZ POSITIVE* 11/08/2011 2245   AMPHETMU NONE DETECTED 11/08/2011 2245   THCU NONE DETECTED 11/08/2011 2245   LABBARB NONE DETECTED 11/08/2011 2245    Alcohol Level: No results found for this basename: ETH,  in the last 72 hours Urinalysis: No results found for this basename: COLORURINE, APPERANCEUR, LABSPEC, PHURINE, GLUCOSEU, HGBUR, BILIRUBINUR, KETONESUR, PROTEINUR, UROBILINOGEN, NITRITE, LEUKOCYTESUR,  in the last 72 hours Misc. Labs:   Micro: Recent Results (from the past 240 hour(s))  CULTURE, BLOOD (ROUTINE X 2)     Status: None   Collection Time    09/18/12  5:35 PM      Result Value Range Status   Specimen Description BLOOD RIGHT ANTECUBITAL   Final   Special Requests BOTTLES DRAWN AEROBIC AND ANAEROBIC 6CC   Final   Culture NO GROWTH 1 DAY   Final   Report Status PENDING   Incomplete  CULTURE, BLOOD (ROUTINE X 2)     Status: None   Collection Time    09/18/12  5:45 PM      Result Value Range  Status   Specimen Description BLOOD LEFT ANTECUBITAL   Final   Special Requests BOTTLES DRAWN AEROBIC AND ANAEROBIC 12CC   Final   Culture NO GROWTH 1 DAY   Final   Report Status PENDING   Incomplete    Studies/Results: No results found.  Medications:  Scheduled: . acidophilus  2 capsule Oral Daily  . ALPRAZolam  1 mg Oral TID  . buPROPion  300 mg Oral Daily  . chlorproMAZINE  100 mg Oral Daily  . doxepin  50 mg Oral QHS  . enoxaparin (LOVENOX) injection  40 mg Subcutaneous Q24H  . fluconazole  100 mg Oral Daily  . fluticasone  2 puff Inhalation BID  . gabapentin  600 mg Oral BID  . gabapentin  800 mg Oral QHS  . lubiprostone  24 mcg Oral BID  . lurasidone  60 mg Oral QHS  . pantoprazole  40 mg Oral QAC breakfast  . piperacillin-tazobactam (ZOSYN)  IV  3.375 g Intravenous Q8H  . topiramate  25 mg Oral TID  . traZODone  150 mg Oral QHS  . vancomycin  1,000 mg Intravenous Q12H  . [START ON 09/20/2012] Vilazodone HCl  40 mg Oral Q breakfast   Continuous: . 0.9 % NaCl with KCl 20 mEq / L     WUJ:[id number], HYDROcodone-acetaminophen, Ipratropium-Albuterol, nicotine, ondansetron (ZOFRAN) IV  Assessment: Principal Problem:   Cellulitis and abscess of leg Active Problems:   Tobacco abuse   Low back pain   Paranoid schizophrenia   Bipolar disorder, unspecified   Generalized anxiety disorder   Obesity (BMI 30.0-34.9)   Acute renal failure   Hyponatremia   1. Cellulitis of the left side. Partial outpatient treatment. Status post previous incision and drainage. No sign of active purulent drainage but there is some residual induration and erythema consistent with cellulitis. Continue vancomycin and Zosyn. Continue supportive treatment.  Hyponatremia. Consistent with hypovolemia. Resolving with IV fluid hydration.  Acute renal failure, presumed to be secondary to prerenal azotemia/dehydration.Marland Kitchen Resolved with IV fluids.  Tobacco abuse. She was advised to stop  smoking.  Psychiatric disorders: Schizophrenia/bipolar disorder/anxiety. Continue psychotropic medications, but will monitor closely for sedation given multiple medications.    Plan:  1. Continue antibiotics as ordered. We'll narrow antibiotic coverage upon discharge when she is medically ready. 2. Decreased IV fluids. 3. Will ask the pharmacist to review the patient's psychotropic medications to assess for dangerous drug-drug interactions.   LOS: 1 day   Davene Jobin 09/19/2012, 11:45 AM

## 2012-09-20 DIAGNOSIS — F319 Bipolar disorder, unspecified: Secondary | ICD-10-CM

## 2012-09-20 DIAGNOSIS — D7589 Other specified diseases of blood and blood-forming organs: Secondary | ICD-10-CM | POA: Diagnosis present

## 2012-09-20 LAB — CBC
HCT: 38.3 % (ref 36.0–46.0)
Hemoglobin: 12.6 g/dL (ref 12.0–15.0)
MCHC: 32.9 g/dL (ref 30.0–36.0)
MCV: 101.1 fL — ABNORMAL HIGH (ref 78.0–100.0)
WBC: 8.2 10*3/uL (ref 4.0–10.5)

## 2012-09-20 LAB — BASIC METABOLIC PANEL
BUN: 15 mg/dL (ref 6–23)
CO2: 24 mEq/L (ref 19–32)
Chloride: 102 mEq/L (ref 96–112)
GFR calc Af Amer: >90 mL/min (ref >90)
Potassium: 4.4 mEq/L (ref 3.5–5.1)

## 2012-09-20 MED ORDER — ALBUTEROL SULFATE (5 MG/ML) 0.5% IN NEBU: 2.5000 mg | INHALATION_SOLUTION | RESPIRATORY_TRACT | Status: DC | PRN

## 2012-09-20 NOTE — Progress Notes (Signed)
Subjective: The patient still has residual left side pain, but hydrocodone is helping better. No subjective fever or chills. No significant drainage from incision site.  Objective: Vital signs in last 24 hours: Filed Vitals:   09/20/12 0540 09/20/12 0712 09/20/12 0946 09/20/12 1330  BP: 109/74  108/76 110/67  Pulse: 82  92 60  Temp: 97.7 F (36.5 C)  97.7 F (36.5 C) 98 F (36.7 C)  TempSrc: Oral     Resp: 18  18 18   Height:      Weight:      SpO2: 96% 94% 93% 94%    Intake/Output Summary (Last 24 hours) at 09/20/12 1601 Last data filed at 09/20/12 1537  Gross per 24 hour  Intake 1486.75 ml  Output      5 ml  Net 1481.75 ml    Weight change: 2.268 kg (5 lb)  Physical exam: General: 47 year old Caucasian woman laying in bed, in no acute distress. Lungs: Clear to auscultation bilaterally. Heart: S1, S2, with no murmurs rubs gallops. Abdomen: Positive bowel sounds, soft, nontender, nondistended. Extremities: Left inner thigh with indurated area approximately 3 cm, scant erythema, and 1 cm surgical excision opening with no active drainage or bleeding. No appreciable tenderness to palpation today. Pedal pulses palpable. No pedal edema. Psychiatric/neurologic: She is alert and oriented x3. Her speech is clear. Cranial nerves II through XII are grossly intact.  Lab Results: Basic Metabolic Panel:  Recent Labs  16/10/96 0502 09/20/12 0538  NA 135 136  K 3.8 4.4  CL 100 102  CO2 26 24  GLUCOSE 125* 107*  BUN 16 15  CREATININE 0.96 0.78  CALCIUM 9.0 9.1   Liver Function Tests:  Recent Labs  09/19/12 0502  AST 15  ALT 16  ALKPHOS 82  BILITOT 0.1*  PROT 6.3  ALBUMIN 2.8*   No results found for this basename: LIPASE, AMYLASE,  in the last 72 hours No results found for this basename: AMMONIA,  in the last 72 hours CBC:  Recent Labs  09/18/12 1710 09/19/12 0502 09/20/12 0538  WBC 13.9* 7.4 8.2  NEUTROABS 11.4*  --   --   HGB 13.0 12.4 12.6  HCT 38.4 36.8  38.3  MCV 99.7 100.8* 101.1*  PLT 295 298 300   Cardiac Enzymes: No results found for this basename: CKTOTAL, CKMB, CKMBINDEX, TROPONINI,  in the last 72 hours BNP: No results found for this basename: PROBNP,  in the last 72 hours D-Dimer: No results found for this basename: DDIMER,  in the last 72 hours CBG: No results found for this basename: GLUCAP,  in the last 72 hours Hemoglobin A1C: No results found for this basename: HGBA1C,  in the last 72 hours Fasting Lipid Panel: No results found for this basename: CHOL, HDL, LDLCALC, TRIG, CHOLHDL, LDLDIRECT,  in the last 72 hours Thyroid Function Tests:  Recent Labs  09/18/12 2202  TSH 0.453   Anemia Panel: No results found for this basename: VITAMINB12, FOLATE, FERRITIN, TIBC, IRON, RETICCTPCT,  in the last 72 hours Coagulation: No results found for this basename: LABPROT, INR,  in the last 72 hours Urine Drug Screen: Drugs of Abuse     Component Value Date/Time   LABOPIA NONE DETECTED 11/08/2011 2245   COCAINSCRNUR NONE DETECTED 11/08/2011 2245   LABBENZ POSITIVE* 11/08/2011 2245   AMPHETMU NONE DETECTED 11/08/2011 2245   THCU NONE DETECTED 11/08/2011 2245   LABBARB NONE DETECTED 11/08/2011 2245    Alcohol Level: No results found for this  basename: ETH,  in the last 72 hours Urinalysis: No results found for this basename: COLORURINE, APPERANCEUR, LABSPEC, PHURINE, GLUCOSEU, HGBUR, BILIRUBINUR, KETONESUR, PROTEINUR, UROBILINOGEN, NITRITE, LEUKOCYTESUR,  in the last 72 hours Misc. Labs:   Micro: Recent Results (from the past 240 hour(s))  CULTURE, BLOOD (ROUTINE X 2)     Status: None   Collection Time    09/18/12  5:35 PM      Result Value Range Status   Specimen Description BLOOD RIGHT ANTECUBITAL   Final   Special Requests BOTTLES DRAWN AEROBIC AND ANAEROBIC 6CC   Final   Culture NO GROWTH 2 DAYS   Final   Report Status PENDING   Incomplete  CULTURE, BLOOD (ROUTINE X 2)     Status: None   Collection Time    09/18/12  5:45  PM      Result Value Range Status   Specimen Description BLOOD LEFT ANTECUBITAL   Final   Special Requests BOTTLES DRAWN AEROBIC AND ANAEROBIC 12CC   Final   Culture NO GROWTH 2 DAYS   Final   Report Status PENDING   Incomplete    Studies/Results: No results found.  Medications:  Scheduled: . acidophilus  2 capsule Oral Daily  . ALPRAZolam  1 mg Oral TID  . buPROPion  300 mg Oral Daily  . chlorproMAZINE  50 mg Oral Daily  . doxepin  25 mg Oral QHS  . enoxaparin (LOVENOX) injection  40 mg Subcutaneous Q24H  . fluconazole  100 mg Oral Daily  . fluticasone  2 puff Inhalation BID  . gabapentin  600 mg Oral Daily  . gabapentin  800 mg Oral QHS  . liothyronine  25 mcg Oral Daily  . lubiprostone  24 mcg Oral BID  . lurasidone  60 mg Oral QHS  . pantoprazole  40 mg Oral QAC breakfast  . piperacillin-tazobactam (ZOSYN)  IV  3.375 g Intravenous Q8H  . topiramate  25 mg Oral Daily  . traZODone  150 mg Oral QHS  . vancomycin  1,000 mg Intravenous Q12H  . Vilazodone HCl  40 mg Oral Q breakfast   Continuous: . 0.9 % NaCl with KCl 20 mEq / L 20 mL/hr at 09/20/12 1610   RUE:[id number], albuterol, HYDROcodone-acetaminophen, Ipratropium-Albuterol, nicotine, ondansetron (ZOFRAN) IV  Assessment: Principal Problem:   Cellulitis and abscess of leg Active Problems:   Tobacco abuse   Low back pain   Paranoid schizophrenia   Bipolar disorder, unspecified   Generalized anxiety disorder   Obesity (BMI 30.0-34.9)   Acute renal failure   Hyponatremia   Macrocytosis   1. Cellulitis of the left thigh. Partial outpatient treatment. Status post previous incision and drainage. No sign of active purulent drainage but there is some residual induration and erythema consistent with cellulitis. Continue vancomycin and Zosyn. Continue supportive treatment.  Hyponatremia. Consistent with hypovolemia. Resolved with IV fluid hydration.  Acute renal failure, presumed to be secondary to prerenal  azotemia/dehydration.Marland Kitchen Resolved with IV fluids.  Tobacco abuse. She was advised to stop smoking.  Macrocytosis. No evidence of anemia. Her TSH is within normal limits. We'll check a vitamin B12 level to rule out deficiency.  Psychiatric disorders: Schizophrenia/bipolar disorder/anxiety. She is on many psychotropic medications and I was concerned about potential drug drug interactions and side effects. I discussed this with pharmacist Ms. Doran Durand yesterday. She reviewed the patient's medications and made adjustments based on information she received from the patient's local pharmacy. This was much appreciated.    Plan:  1. continue  antibiotics and supportive treatment. Continue warm compress as needed. 2. Likely discharge tomorrow. 3. Check results a vitamin B12 pending.   LOS: 2 days   Cambrea Kirt 09/20/2012, 4:01 PM

## 2012-09-21 LAB — BASIC METABOLIC PANEL
BUN: 13 mg/dL (ref 6–23)
CO2: 23 mEq/L (ref 19–32)
Glucose, Bld: 118 mg/dL — ABNORMAL HIGH (ref 70–99)
Potassium: 4.2 mEq/L (ref 3.5–5.1)
Sodium: 136 mEq/L (ref 135–145)

## 2012-09-21 MED ORDER — HYDROCODONE-ACETAMINOPHEN 5-325 MG PO TABS: 1.0000 | ORAL_TABLET | ORAL | Status: DC | PRN

## 2012-09-21 MED ORDER — ONDANSETRON HCL 4 MG PO TABS: 4.0000 mg | ORAL_TABLET | Freq: Three times a day (TID) | ORAL | Status: DC | PRN

## 2012-09-21 MED ORDER — AMOXICILLIN-POT CLAVULANATE 500-125 MG PO TABS: 1.0000 | ORAL_TABLET | Freq: Two times a day (BID) | ORAL | Status: DC

## 2012-09-21 MED ORDER — BENAZEPRIL HCL 20 MG PO TABS: 10.0000 mg | ORAL_TABLET | Freq: Every day | ORAL | Status: DC

## 2012-09-21 MED ORDER — NICOTINE 21 MG/24HR TD PT24: MEDICATED_PATCH | TRANSDERMAL | Status: DC

## 2012-09-21 NOTE — Progress Notes (Signed)
Pt. D/c instructions provided, prescriptions provided, and MD orders reviewed. Medications reviewed, IV removed, and patient education provided.

## 2012-09-21 NOTE — Discharge Summary (Signed)
Physician Discharge Summary  Meredith [name] ZOX:[id number] DOB: 1965-09-26 DOA: 09/18/2012  PCP: Alva Garnet., MD  Admit date: 09/18/2012 Discharge date: 09/21/2012  Time spent: Greater than 30 minutes  Recommendations for Outpatient Follow-up:  1. The patient will followup with general surgery (as a new patient) for evaluation of left thigh cellulitis and incision and drainage site.  Discharge Diagnoses:  1. Cellulitis and abscess of the left thigh. Status post incision and drainage x2 in the emergency department. Partially failed outpatient treatment. 2. Acute renal failure secondary to prerenal azotemia. Resolved. 3. Hyponatremia secondary to hypovolemia. Resolved. 4. Mild macrocytosis. Vitamin B12 level and TSH were within normal limits. 5. Tobacco abuse. The patient was advised to stop smoking. 6. Chronic low back pain. 7. Mild obesity. 8. History of hypertension but low-normal blood pressures during the hospitalization. 9. Psychiatric disorders including schizophrenia, bipolar disorder, and generalized anxiety. Completely stable. On many psychotropic medications.   Discharge Condition: Improved.  Diet recommendation: Heart healthy.  Filed Weights   09/18/12 1540 09/20/12 0500  Weight: 83.915 kg (185 lb) 86.183 kg (190 lb)    History of present illness:   Meredith [name] is a 47 y.o.woman  with apparent schizophrenia and bipolar disorder and past history of polysubstance abuse including narcotics, who had a pimple on her left thigh about a week and a half ago. She squeezed it, but it did not burst. Over the next few days, her thigh became more swollen, red and tender. She began having fever, chills, and sweats. She was seen in the emergency room 6 days ago. At that time, there was an attempted I&D but was negative for pus. She was discharged from the ED on Bactrim. The area became worse. She returned to the ED again 2 days ago and the second attempted incision and drainage  produced a large amount of pus. Apparently a specimen was not sent for culturing. The area of redness did decrease, but she returned to the emergency department on 09/18/2012 because she did not feel quite right.  The ED physician noted that the area i was red and indurated and revealed extensive cellulitis. It was felt that she would benefit from IV antibiotics for a few days. She has no history of diabetes. No recent trauma to the left thigh.     Hospital Course:  In the emergency department, she was afebrile. She had transient hypotension, but her blood pressure quickly improved with IV fluid hydration. Her lab data were significant for a serum sodium of 127, creatinine of 1.56, and WBC of 13.9. Blood cultures were ordered. Empiric antibiotics were started with Zosyn and vancomycin. Wet to dry dressing changes were ordered. There was minimal drainage from the previous incision site. None of the drainage was purulent or malodorous. There was mild surrounding induration and erythema. Her pain was treated with as needed Voltaren and as needed hydrocodone. Vigorous IV fluid hydration was started. Most if not all of her psychotropic medications were continued however her antihypertensive medications were withheld because of low-normal blood pressures and acute renal insufficiency. A nicotine patch was ordered for nicotine replacement therapy. She was advised to stop smoking.  She was noted to have mild macrocytosis. Therefore, vitamin B12 level and TSH were ordered. Both were within normal limits.  Over the course of the hospitalization, she improved clinically and symptomatically. There was slightly less induration and significantly less erythema of the left inner thigh cellulitic site. She remained afebrile. Her white blood cell count normalized. Her renal  function normalized. Her blood cultures remained negative to date. Her blood pressure improved. She received 3 full days of vancomycin and Zosyn. She was  discharged on 6 more days of Augmentin. Although general surgery was not consulted, an appointment was made for her to followup with general surgery after discharge for surveillance reasons and to assess the need for additional incision and drainage.    Procedures:  None  Consultations:  None  Discharge Exam: Filed Vitals:   09/21/12 0200 09/21/12 0546 09/21/12 0718 09/21/12 1041  BP: 105/67 110/72  117/78  Pulse: 76 77  85  Temp: 97.6 F (36.4 C) 97.8 F (36.6 C)  97.6 F (36.4 C)  TempSrc: Oral Oral  Oral  Resp: 15 18  18   Height:      Weight:      SpO2: 99% 95% 95% 98%    General: 47 year old Caucasian woman who is sitting up in bed, alert, oriented, and in no acute distress. Cardiovascular: S1, S2, with no murmurs rubs or gallops. Respiratory: Clear to auscultation bilaterally. Extremities: Left inner thigh with mildly indurated area approximately 3 cm, scant erythema, and 1 cm surgical excision opening with no active drainage or bleeding. No appreciable tenderness to palpation today. Pedal pulses palpable. No pedal edema.   Discharge Instructions  Discharge Orders   Future Appointments Provider Department Dept Phone   10/02/2012 4:15 PM Vevelyn Royals, [place] [facility] [phone]   10/08/2012 2:00 PM Tilda Burrow, MD [facility] [phone]   03/06/2013 1:30 PM Su Monks, PA-C [facility] [phone]   Future Orders Complete By Expires     Diet general  As directed     Discharge instructions  As directed     Comments:      Try to stop smoking.    Discharge wound care:  As directed     Comments:      Clean left thigh area with mild soapy water daily. Cover with Band-Aid or gauze.    Increase activity slowly  As directed         Medication List    STOP taking these medications       sulfamethoxazole-trimethoprim 800-160 MG per tablet  Commonly known as:  SEPTRA DS       TAKE these medications       albuterol (2.5 MG/3ML) 0.083% nebulizer solution  Commonly known as:  PROVENTIL  Take 2.5 mg by nebulization 2 (two) times daily as needed for wheezing.     albuterol-ipratropium 18-103 MCG/ACT inhaler  Commonly known as:  COMBIVENT  Inhale 2 puffs into the lungs every 6 (six) hours as needed. For wheezing     ALPRAZolam 1 MG tablet  Commonly known as:  XANAX  Take 1 mg by mouth 3 (three) times daily.     amoxicillin-clavulanate 500-125 MG per tablet  Commonly known as:  AUGMENTIN  Take 1 tablet (500 mg total) by mouth 2 (two) times daily. Starting tomorrow, take antibiotic for 6 more days.     beclomethasone 80 MCG/ACT inhaler  Commonly known as:  QVAR  Inhale 2 puffs into the lungs 2 (two) times daily as needed (for difficulty breathing).     benazepril 20 MG tablet  Commonly known as:  LOTENSIN  Take 0.5 tablets (10 mg total) by mouth daily.     buPROPion 300 MG 24 hr tablet  Commonly known as:  WELLBUTRIN XL  Take 300 mg by mouth  daily.     celecoxib 200 MG capsule  Commonly known as:  CELEBREX  Take 200 mg by mouth daily.     chlorproMAZINE 50 MG tablet  Commonly known as:  THORAZINE  Take 50 mg by mouth daily.     cyclobenzaprine 10 MG tablet  Commonly known as:  FLEXERIL  Take 10 mg by mouth daily as needed for muscle spasms.     doxepin 25 MG capsule  Commonly known as:  SINEQUAN  Take 25 mg by mouth at bedtime.     gabapentin 800 MG tablet  Commonly known as:  NEURONTIN  Take 800 mg by mouth at bedtime.     gabapentin 300 MG capsule  Commonly known as:  NEURONTIN  Take 600 mg by mouth daily.     hydrochlorothiazide 12.5 MG capsule  Commonly known as:  MICROZIDE  Take 12.5 mg by mouth daily.     HYDROcodone-acetaminophen 5-325 MG per tablet  Commonly known as:  NORCO/VICODIN  Take 1 tablet by mouth every 4 (four) hours as needed.     liothyronine 25 MCG tablet  Commonly known as:  CYTOMEL  Take 25 mcg by mouth  daily.     lubiprostone 24 MCG capsule  Commonly known as:  AMITIZA  Take 24 mcg by mouth 2 (two) times daily.     Lurasidone HCl 120 MG Tabs  Take 120 mg by mouth at bedtime.     MYRBETRIQ 50 MG Tb24  Generic drug:  mirabegron ER  Take 50 mg by mouth daily.     nicotine 21 mg/24hr patch  Commonly known as:  NICODERM CQ - dosed in mg/24 hours  Use as directed per instruction labels.     ondansetron 4 MG tablet  Commonly known as:  ZOFRAN  Take 1 tablet (4 mg total) by mouth every 8 (eight) hours as needed for nausea.     pantoprazole 40 MG tablet  Commonly known as:  PROTONIX  Take 1 tablet (40 mg total) by mouth every morning.     topiramate 25 MG tablet  Commonly known as:  TOPAMAX  Take 25 mg by mouth daily.     traMADol 50 MG tablet  Commonly known as:  ULTRAM  Take 50 mg by mouth daily.     traZODone 150 MG tablet  Commonly known as:  DESYREL  Take 150 mg by mouth at bedtime.     VIIBRYD 40 MG Tabs  Generic drug:  Vilazodone HCl  Take 40 mg by mouth every morning.       No Known Allergies     Follow-up Information   Follow up with Fabio Bering, MD On 09/27/2012. (Your appointment is 1015)    Contact information:   [address] [facility] [id number] [phone]        The results of significant diagnostics from this hospitalization (including imaging, microbiology, ancillary and laboratory) are listed below for reference.    Significant Diagnostic Studies: No results found.  Microbiology: Recent Results (from the past 240 hour(s))  CULTURE, BLOOD (ROUTINE X 2)     Status: None   Collection Time    09/18/12  5:35 PM      Result Value Range Status   Specimen Description BLOOD RIGHT ANTECUBITAL   Final   Special Requests BOTTLES DRAWN AEROBIC AND ANAEROBIC 6CC   Final   Culture NO GROWTH 3 DAYS   Final   Report Status PENDING   Incomplete  CULTURE, BLOOD (ROUTINE  X 2)     Status: None   Collection Time    09/18/12  5:45 PM       Result Value Range Status   Specimen Description BLOOD LEFT ANTECUBITAL   Final   Special Requests BOTTLES DRAWN AEROBIC AND ANAEROBIC 12CC   Final   Culture NO GROWTH 3 DAYS   Final   Report Status PENDING   Incomplete     Labs: Basic Metabolic Panel:  Recent Labs Lab 09/18/12 1710 09/19/12 0502 09/20/12 0538 09/21/12 0537  NA 127* 135 136 136  K 3.9 3.8 4.4 4.2  CL 91* 100 102 101  CO2 24 26 24 23   GLUCOSE 106* 125* 107* 118*  BUN 19 16 15 13   CREATININE 1.56* 0.96 0.78 0.81  CALCIUM 8.8 9.0 9.1 9.3   Liver Function Tests:  Recent Labs Lab 09/19/12 0502  AST 15  ALT 16  ALKPHOS 82  BILITOT 0.1*  PROT 6.3  ALBUMIN 2.8*   No results found for this basename: LIPASE, AMYLASE,  in the last 168 hours No results found for this basename: AMMONIA,  in the last 168 hours CBC:  Recent Labs Lab 09/18/12 1710 09/19/12 0502 09/20/12 0538  WBC 13.9* 7.4 8.2  NEUTROABS 11.4*  --   --   HGB 13.0 12.4 12.6  HCT 38.4 36.8 38.3  MCV 99.7 100.8* 101.1*  PLT 295 298 300   Cardiac Enzymes: No results found for this basename: CKTOTAL, CKMB, CKMBINDEX, TROPONINI,  in the last 168 hours BNP: BNP (last 3 results) No results found for this basename: PROBNP,  in the last 8760 hours CBG: No results found for this basename: GLUCAP,  in the last 168 hours     Signed:  Turki Tapanes  [facility] 09/21/2012, 6:05 PM

## 2012-09-23 LAB — CULTURE, BLOOD (ROUTINE X 2): Culture: NO GROWTH

## 2012-09-26 MED FILL — Hydrocodone-Acetaminophen Tab 5-325 MG: ORAL | Qty: 6 | Status: AC

## 2012-10-02 ENCOUNTER — Encounter: Payer: Medicaid Other | Attending: Nurse Practitioner | Admitting: Dietician

## 2012-10-02 ENCOUNTER — Encounter: Payer: Self-pay | Admitting: Dietician

## 2012-10-02 ENCOUNTER — Ambulatory Visit: Payer: Medicaid Other | Admitting: *Deleted

## 2012-10-02 VITALS — Wt 190.3 lb

## 2012-10-02 DIAGNOSIS — Z713 Dietary counseling and surveillance: Secondary | ICD-10-CM | POA: Insufficient documentation

## 2012-10-02 DIAGNOSIS — E669 Obesity, unspecified: Secondary | ICD-10-CM | POA: Insufficient documentation

## 2012-10-02 DIAGNOSIS — I10 Essential (primary) hypertension: Secondary | ICD-10-CM | POA: Insufficient documentation

## 2012-10-02 NOTE — Progress Notes (Signed)
Medical Nutrition Therapy:  Appt start time: 1600 end time:  1630.  Assessment:  Primary concerns today: obesity. Pt has been hospitalized recently, and attributes .   MEDICATIONS: see list.    DIETARY INTAKE:  Usual eating pattern includes 2 meals and 1-2 snacks per day.  Everyday foods include sausage and eggs, diet sun drop, doritos.  Avoided foods include none noted.    24-hr recall:  B ( AM): sausage and eggs, sometimes toast. Diet sun drop. Claims this sausage brand is less greasy. Snk ( AM): none  L ( PM): usually skipping lunch Snk ( PM): doritos (large portion) D ( PM): mostly baked foods, according to pt. Mostly veg for sides- carrots, corn, pinto beans, potatoes. Snk ( PM): fun size m and ms pack Beverages: diet sun drop, water  Usual physical activity: trying to use exercise machine 6 days per week 20 minutes per day. She has also been doing some toe touches and torso twists. She was not doing much of any activity since hospital visit.  Progress Towards Goal(s):  In progress.   Nutritional Diagnosis:  [place]-3.3 Overweight/obesity As related to meal skipping, high fat food choices (doritos), low physical activity recently.  As evidenced by diet recall, wt gain 8+ lbs since last visit.    Intervention:  Nutrition counseling provided regarding choosing lower kcal foods, eating 3 round meals with high protein foods, fewer starches and high fat items. RD also encouraged pt to return to positive exercise habits, and she states she will. Home workout provided to offer other options for exercise.  Handouts given during visit include:  Home Workout  Cool Down  Monitoring/Evaluation:  Dietary intake, exercise, portion control, meal pattern, and body weight in 2 month(s).

## 2012-10-08 ENCOUNTER — Encounter: Payer: Self-pay | Admitting: Obstetrics and Gynecology

## 2012-10-08 ENCOUNTER — Ambulatory Visit (INDEPENDENT_AMBULATORY_CARE_PROVIDER_SITE_OTHER): Payer: Medicaid Other | Admitting: Obstetrics and Gynecology

## 2012-10-08 VITALS — BP 118/74 | Ht 63.5 in | Wt 186.6 lb

## 2012-10-08 DIAGNOSIS — Z1212 Encounter for screening for malignant neoplasm of rectum: Secondary | ICD-10-CM

## 2012-10-08 DIAGNOSIS — Z0000 Encounter for general adult medical examination without abnormal findings: Secondary | ICD-10-CM

## 2012-10-08 DIAGNOSIS — Z01419 Encounter for gynecological examination (general) (routine) without abnormal findings: Secondary | ICD-10-CM

## 2012-10-08 NOTE — Progress Notes (Signed)
  Assessment:  Normal Gyn Exam S/p left thigh abscess hosp'd x 4days   Plan:  1. Mammogram q yr needs first        2. Return q 3 yrs  Subjective:  Meredith [name] is a 47 y.o. female No obstetric history on file. who presents for annual exam.  The patient has complaints today of none.  The following portions of the patient's history were reviewed and updated as appropriate: allergies, current medications, past family history, past medical history, past social history, past surgical history and problem list.  Review of Systems Pertinent items are noted in HPI.  Objective:  BP 118/74  Ht 5' 3.5" (1.613 m)  Wt 186 lb 9.6 oz (84.641 kg)  BMI 32.53 kg/m2  BMI: Body mass index is 32.53 kg/(m^2). General Appearance: Alert, appropriate appearance for age. No acute distress HEENT: Grossly normal Neck / Thyroid:  Cardiovascular: RRR; normal S1, S2, no murmur Lungs: CTA bilaterally Back: No CVAT Breast Exam: No dimpling, nipple retraction or discharge. No masses or nodes. and No masses or nodes.No dimpling, nipple retraction or discharge. Gastrointestinal: Soft, non-tender, no masses or organomegaly well healed s/p hyst Pelvic Exam: External genitalia: normal general appearance Vaginal: normal mucosa without prolapse or lesions Cervix: absent Adnexa: removed surgically Uterus: absent Rectovaginal: not indicated and normal rectal, no masses Lymphatic Exam: Non-palpable nodes in neck, clavicular, axillary, or inguinal regions Skin: no rash or abnormalities Neurologic: Normal gait and speech, no tremor  Psychiatric: Alert and oriented, appropriate affect.  Urinalysis:Not done  Christin Bach. MD Pgr [phone] 3:42 PM

## 2012-10-08 NOTE — Patient Instructions (Addendum)
No longer need paps You may schedule your own Mammogram at [phone]. Gyn visits every 3 years. Colonoscopy at age 47, you need annual /yearly tests for silent blood in colon.

## 2012-10-24 ENCOUNTER — Other Ambulatory Visit: Payer: Self-pay | Admitting: Obstetrics and Gynecology

## 2012-12-04 ENCOUNTER — Ambulatory Visit: Payer: Medicaid Other | Admitting: *Deleted

## 2012-12-06 ENCOUNTER — Ambulatory Visit: Payer: Medicaid Other | Admitting: *Deleted

## 2012-12-13 ENCOUNTER — Encounter: Payer: Self-pay | Admitting: *Deleted

## 2012-12-13 ENCOUNTER — Encounter: Payer: Medicaid Other | Attending: Nurse Practitioner | Admitting: *Deleted

## 2012-12-13 VITALS — Ht 64.0 in | Wt 182.6 lb

## 2012-12-13 DIAGNOSIS — E669 Obesity, unspecified: Secondary | ICD-10-CM | POA: Insufficient documentation

## 2012-12-13 DIAGNOSIS — Z713 Dietary counseling and surveillance: Secondary | ICD-10-CM | POA: Insufficient documentation

## 2012-12-13 DIAGNOSIS — I10 Essential (primary) hypertension: Secondary | ICD-10-CM | POA: Insufficient documentation

## 2012-12-13 NOTE — Patient Instructions (Signed)
Goals:  Clean out front room and move items to outside building to make room to exercise inside the house  Continue eating 2-3 times daily and consider aiming for 2 Carb Choices (30 grams) +/- per meal  Continue including a protein- eggs, peanut butter, milk, meat, etc. At each meal as able  Continue using the MyPlate at supper time. Plate should be half non-starchy veggies, one quarter starch, one quarter meat.  Continue choosing less fried food in favor of baked or grilled, smaller portions of starches like potatoes, rinsing canned vegetables well to reduce salt content.  Consider increasing your activity level consistently for a total of 15 minutes every day. (arm chair exercises, walking to mail box, bringing wood inside from wood pile, dancing, riding your bike, or The 7 Minute Workout APP)  Play with your new puppy!
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
## 2012-12-14 ENCOUNTER — Other Ambulatory Visit: Payer: Self-pay | Admitting: Gastroenterology

## 2012-12-14 ENCOUNTER — Encounter: Payer: Self-pay | Admitting: Physical Medicine and Rehabilitation

## 2012-12-14 ENCOUNTER — Encounter
Payer: Medicaid Other | Attending: Physical Medicine and Rehabilitation | Admitting: Physical Medicine and Rehabilitation

## 2012-12-14 VITALS — BP 97/51 | HR 88 | Resp 14 | Ht 65.5 in | Wt 184.4 lb

## 2012-12-14 DIAGNOSIS — M545 Low back pain, unspecified: Secondary | ICD-10-CM | POA: Insufficient documentation

## 2012-12-14 DIAGNOSIS — R29898 Other symptoms and signs involving the musculoskeletal system: Secondary | ICD-10-CM | POA: Insufficient documentation

## 2012-12-14 DIAGNOSIS — M25569 Pain in unspecified knee: Secondary | ICD-10-CM | POA: Insufficient documentation

## 2012-12-14 DIAGNOSIS — M47817 Spondylosis without myelopathy or radiculopathy, lumbosacral region: Secondary | ICD-10-CM

## 2012-12-14 DIAGNOSIS — G8929 Other chronic pain: Secondary | ICD-10-CM | POA: Insufficient documentation

## 2012-12-14 DIAGNOSIS — E669 Obesity, unspecified: Secondary | ICD-10-CM | POA: Insufficient documentation

## 2012-12-14 DIAGNOSIS — M79609 Pain in unspecified limb: Secondary | ICD-10-CM | POA: Insufficient documentation
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
## 2012-12-17 ENCOUNTER — Encounter: Payer: Self-pay | Admitting: Adult Health

## 2012-12-17 ENCOUNTER — Ambulatory Visit (INDEPENDENT_AMBULATORY_CARE_PROVIDER_SITE_OTHER): Payer: Medicaid Other | Admitting: Adult Health

## 2012-12-17 VITALS — BP 100/65 | HR 87 | Ht 65.5 in | Wt 186.0 lb

## 2012-12-17 DIAGNOSIS — F17200 Nicotine dependence, unspecified, uncomplicated: Secondary | ICD-10-CM

## 2012-12-17 DIAGNOSIS — R002 Palpitations: Secondary | ICD-10-CM

## 2012-12-17 DIAGNOSIS — R030 Elevated blood-pressure reading, without diagnosis of hypertension: Secondary | ICD-10-CM

## 2012-12-17 DIAGNOSIS — Z720 Tobacco use: Secondary | ICD-10-CM
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
## 2012-12-17 NOTE — Patient Instructions (Addendum)
Your physician recommends that you schedule a follow-up appointment in: PRN 

## 2012-12-17 NOTE — Assessment & Plan Note (Signed)
Blood pressure is well controlled. She will continue current medications as directed. She does not have any acute or chronic cardiac issues and will not need to follow with cardiology unless prn. She is referred back to her PCP for ongoing treatment and health management.

## 2012-12-17 NOTE — Assessment & Plan Note (Signed)
I have spoken to her about tobacco cessation. She is up to 1 ppdd. I have discussed CVRF concerning ongoing abuse and CAD. She verbalized understanding.

## 2012-12-17 NOTE — Progress Notes (Signed)
Name: Meredith [name]    DOB: 1965/10/03  Age: 47 y.o.  MR#: [id number]       PCP:  Alva Garnet., MD      Insurance: Payor: [place]ICAID [place] / Plan: MED[place]ID [place] ACCESS / Product Type: *No Product type* /   CC:    Chief Complaint  Patient presents with  . Hypertension    VS Filed Vitals:   12/17/12 1516  BP: 100/65  Pulse: 87  Height: 5' 5.5" (1.664 m)  Weight: 186 lb (84.369 kg)    Weights Current Weight  12/17/12 186 lb (84.369 kg)  12/14/12 184 lb 6.4 oz (83.643 kg)  10/08/12 186 lb 9.6 oz (84.641 kg)    Blood Pressure  BP Readings from Last 3 Encounters:  12/17/12 100/65  12/14/12 97/51  10/08/12 118/74     Admit date:  (Not on file) Last encounter with RMR:  Visit date not found   Allergy Review of patient's allergies indicates no known allergies.  Current Outpatient Prescriptions  Medication Sig Dispense Refill  . albuterol (PROVENTIL) (2.5 MG/3ML) 0.083% nebulizer solution Take 2.5 mg by nebulization 2 (two) times daily as needed for wheezing.      Marland Kitchen albuterol-ipratropium (COMBIVENT) 18-103 MCG/ACT inhaler Inhale 2 puffs into the lungs every 6 (six) hours as needed. For wheezing      . ALPRAZolam (XANAX) 1 MG tablet Take 1 mg by mouth 3 (three) times daily.       . beclomethasone (QVAR) 80 MCG/ACT inhaler Inhale 2 puffs into the lungs 2 (two) times daily as needed (for difficulty breathing).      . benazepril-hydrochlorthiazide (LOTENSIN HCT) 20-12.5 MG per tablet Take 1 tablet by mouth daily.      Marland Kitchen buPROPion (WELLBUTRIN XL) 300 MG 24 hr tablet Take 300 mg by mouth daily.      . celecoxib (CELEBREX) 200 MG capsule Take 200 mg by mouth daily.      . chlorproMAZINE (THORAZINE) 50 MG tablet Take 50 mg by mouth daily.      . cyclobenzaprine (FLEXERIL) 10 MG tablet Take 10 mg by mouth daily as needed for muscle spasms.      Marland Kitchen doxepin (SINEQUAN) 25 MG capsule Take 25 mg by mouth at bedtime.      . gabapentin (NEURONTIN) 300 MG capsule Take 600 mg by mouth daily.       Marland Kitchen gabapentin (NEURONTIN) 800 MG tablet Take 800 mg by mouth at bedtime.      Marland Kitchen liothyronine (CYTOMEL) 25 MCG tablet Take 25 mcg by mouth daily.      Marland Kitchen lubiprostone (AMITIZA) 24 MCG capsule Take 24 mcg by mouth 2 (two) times daily.      . mirabegron ER (MYRBETRIQ) 50 MG TB24 Take 50 mg by mouth daily.      . pantoprazole (PROTONIX) 40 MG tablet TAKE ONE TABLET BY MOUTH DAILY.  31 tablet  11  . topiramate (TOPAMAX) 25 MG tablet Take 25 mg by mouth daily.      . traMADol (ULTRAM) 50 MG tablet Take 50 mg by mouth daily.      . traZODone (DESYREL) 150 MG tablet Take 150 mg by mouth at bedtime.      . Vilazodone HCl (VIIBRYD) 40 MG TABS Take 40 mg by mouth every morning.       . [DISCONTINUED] citalopram (CELEXA) 40 MG tablet Take 40 mg by mouth daily.         No current facility-administered medications for this visit.  Discontinued Meds:    Medications Discontinued During This Encounter  Medication Reason  . hydrochlorothiazide (MICROZIDE) 12.5 MG capsule Error  . benazepril (LOTENSIN) 20 MG tablet Error  . HYDROcodone-acetaminophen (NORCO/VICODIN) 5-325 MG per tablet Error  . Lurasidone HCl 120 MG TABS Error    Patient Active Problem List   Diagnosis Date Noted  . Macrocytosis 09/20/2012  . Acute renal failure 09/19/2012  . Hyponatremia 09/19/2012  . Cellulitis and abscess of leg 09/19/2012  . Paranoid schizophrenia 09/18/2012  . Bipolar disorder, unspecified 09/18/2012  . Generalized anxiety disorder 09/18/2012  . Obesity (BMI 30.0-34.9) 09/18/2012  . Low back pain 04/02/2012  . Lumbar spondylosis 04/02/2012  . Constipation 11/28/2011  . Chest pain 11/09/2011  . Abdominal pain 11/09/2011  . Dysphagia 08/23/2011  . Abdominal bloating 08/23/2011  . Dyspnea on exertion 09/30/2010  . Palpitations 09/30/2010  . Elevated blood pressure reading without diagnosis of hypertension 09/30/2010  . Tobacco abuse 09/30/2010    LABS    Component Value Date/Time   NA 136  09/21/2012 0537   NA 136 09/20/2012 0538   NA 135 09/19/2012 0502   K 4.2 09/21/2012 0537   K 4.4 09/20/2012 0538   K 3.8 09/19/2012 0502   CL 101 09/21/2012 0537   CL 102 09/20/2012 0538   CL 100 09/19/2012 0502   CO2 23 09/21/2012 0537   CO2 24 09/20/2012 0538   CO2 26 09/19/2012 0502   GLUCOSE 118* 09/21/2012 0537   GLUCOSE 107* 09/20/2012 0538   GLUCOSE 125* 09/19/2012 0502   BUN 13 09/21/2012 0537   BUN 15 09/20/2012 0538   BUN 16 09/19/2012 0502   CREATININE 0.81 09/21/2012 0537   CREATININE 0.78 09/20/2012 0538   CREATININE 0.96 09/19/2012 0502   CREATININE 0.89 01/23/2012 1344   CALCIUM 9.3 09/21/2012 0537   CALCIUM 9.1 09/20/2012 0538   CALCIUM 9.0 09/19/2012 0502   GFRNONAA 86* 09/21/2012 0537   GFRNONAA >90 09/20/2012 0538   GFRNONAA 70* 09/19/2012 0502   GFRAA >90 09/21/2012 0537   GFRAA >90 09/20/2012 0538   GFRAA 81* 09/19/2012 0502   CMP     Component Value Date/Time   NA 136 09/21/2012 0537   K 4.2 09/21/2012 0537   CL 101 09/21/2012 0537   CO2 23 09/21/2012 0537   GLUCOSE 118* 09/21/2012 0537   BUN 13 09/21/2012 0537   CREATININE 0.81 09/21/2012 0537   CREATININE 0.89 01/23/2012 1344   CALCIUM 9.3 09/21/2012 0537   PROT 6.3 09/19/2012 0502   ALBUMIN 2.8* 09/19/2012 0502   AST 15 09/19/2012 0502   ALT 16 09/19/2012 0502   ALKPHOS 82 09/19/2012 0502   BILITOT 0.1* 09/19/2012 0502   GFRNONAA 86* 09/21/2012 0537   GFRAA >90 09/21/2012 0537       Component Value Date/Time   WBC 8.2 09/20/2012 0538   WBC 7.4 09/19/2012 0502   WBC 13.9* 09/18/2012 1710   HGB 12.6 09/20/2012 0538   HGB 12.4 09/19/2012 0502   HGB 13.0 09/18/2012 1710   HCT 38.3 09/20/2012 0538   HCT 36.8 09/19/2012 0502   HCT 38.4 09/18/2012 1710   MCV 101.1* 09/20/2012 0538   MCV 100.8* 09/19/2012 0502   MCV 99.7 09/18/2012 1710    Lipid Panel  No results found for this basename: chol, trig, hdl, cholhdl, vldl, ldlcalc    ABG    Component Value Date/Time   HCO3 24.5* 04/27/2007 2223   TCO2 27 01/20/2012 0919   ACIDBASEDEF  2.0 09/07/2006 2256  Lab Results  Component Value Date   TSH 0.453 09/18/2012   BNP (last 3 results) No results found for this basename: PROBNP,  in the last 8760 hours Cardiac Panel (last 3 results) No results found for this basename: CKTOTAL, CKMB, TROPONINI, RELINDX,  in the last 72 hours  Iron/TIBC/Ferritin No results found for this basename: iron, tibc, ferritin     EKG Orders placed in visit on 12/17/12  . EKG 12-LEAD     Prior Assessment and Plan Problem List as of 12/17/2012   Dyspnea on exertion   Last Assessment & Plan   09/30/2010 Office Visit Written 09/30/2010  2:25 PM by Jonelle Sidle, MD     Present for several months as outlined. I have recommended complete smoking cessation, diet and exercise. Principal cardiac risk factors include ongoing tobacco use, family history of premature cardiovascular disease. Blood pressure elevated today, although without standing diagnosis of hypertension. She is very concerned about the potential for obstructive CAD, mainly related to her father's history. Although suspicion is low that her shortness of breath is a reflection of underlying obstructive ischemic heart disease, an exercise echocardiogram will be obtained for objective assessment and further reassurance. If this is reassuring, would focus on general risk factor modification strategies.    Palpitations   Last Assessment & Plan   09/30/2010 Office Visit Written 09/30/2010  2:22 PM by Jonelle Sidle, MD     Intermittent and fairly long-standing as outlined above, not associated with syncope. No clear precipitant is described. Resting ECG is normal with normal intervals. Have recommended caffeine restriction, regular walking regimen. A 7 day cardiac monitor will be provided to investigate this more objectively in terms of any potential rhythm change that might require further evaluation.. We will inform her of the results.    Elevated blood pressure reading without diagnosis  of hypertension   Last Assessment & Plan   09/30/2010 Office Visit Written 09/30/2010  2:26 PM by Jonelle Sidle, MD     Continue observation with primary care physician. Diet and exercise discussed.    Tobacco abuse   Last Assessment & Plan   09/30/2010 Office Visit Written 09/30/2010  2:25 PM by Jonelle Sidle, MD     Complete smoking cessation is recommended. We discussed this today.    Dysphagia   Last Assessment & Plan   08/23/2011 Office Visit Edited 08/25/2011  9:56 PM by Joselyn Arrow, NP     EGD with possible esophageal dilation with Dr. Jena Gauss. Phenergan 12.5mg  IV 30 min prior to procedure.  I have discussed risks & benefits which include, but are not limited to, bleeding, infection, perforation & drug reaction.  The patient agrees with this plan & written consent will be obtained.    Stop ranitidine Start Protonix 40 mg 30 minutes before breakfast 1-800-quit-now for help quitting smoking     Abdominal bloating   Last Assessment & Plan   08/23/2011 Office Visit Written 08/25/2011  9:55 PM by Joselyn Arrow, NP     Recommend 1-2# weight loss per week until ideal body weight through exercise & diet. Low fat/cholesterol diet. Gradually increase exercise from 15 min daily up to 1 hr per day 5 days/week. Limit alcohol use.     Chest pain   Abdominal pain   Last Assessment & Plan   01/17/2012 Office Visit Written 01/18/2012  3:54 PM by Nira Retort, NP     Resolved. Likely due to underlying constipation, now under control.  Constipation   Last Assessment & Plan   01/17/2012 Office Visit Written 01/18/2012  3:54 PM by Nira Retort, NP     Excellent results with Amitiza. Continue current regimen. Return in 1 year or sooner as needed.    Low back pain   Lumbar spondylosis   Paranoid schizophrenia   Bipolar disorder, unspecified   Generalized anxiety disorder   Obesity (BMI 30.0-34.9)   Acute renal failure   Hyponatremia   Cellulitis and abscess of leg   Macrocytosis        Imaging: No results found.

## 2012-12-17 NOTE — Assessment & Plan Note (Addendum)
No complaints of palpitations or tachycardia. EKG shows NSR. No further cardiac testing is necessary. She will see cardiology on PRN basis.

## 2012-12-18 ENCOUNTER — Encounter: Payer: Self-pay | Admitting: *Deleted

## 2013-01-02 ENCOUNTER — Encounter: Payer: Self-pay | Admitting: Internal Medicine

## 2013-01-14 ENCOUNTER — Other Ambulatory Visit: Payer: Self-pay | Admitting: Physical Medicine and Rehabilitation

## 2013-01-24 ENCOUNTER — Encounter: Payer: Medicaid Other | Attending: Nurse Practitioner | Admitting: *Deleted

## 2013-01-24 VITALS — Wt 180.8 lb

## 2013-01-24 DIAGNOSIS — E669 Obesity, unspecified: Secondary | ICD-10-CM | POA: Insufficient documentation

## 2013-01-24 DIAGNOSIS — Z713 Dietary counseling and surveillance: Secondary | ICD-10-CM | POA: Insufficient documentation

## 2013-01-24 DIAGNOSIS — I10 Essential (primary) hypertension: Secondary | ICD-10-CM | POA: Insufficient documentation

## 2013-01-24 NOTE — Progress Notes (Signed)
Medical Nutrition Therapy:  Appt start time: 1700 end time:  1730.  Assessment:  Primary concerns today: obesity follow up visit. Happy with 2 pound weight loss in past 6 weeks. States she is not eating chips anymore. She eating more fresh fruits now. She is playing with their new puppy about 20 minutes a day. She is walking to the mail box more now.  MEDICATIONS: see list.    DIETARY INTAKE:  Usual eating pattern includes 2 meals and 1-2 snacks per day.  Everyday foods include sausage and eggs, diet sun drop, doritos.  Avoided foods include none noted.    24-hr recall:  B ( AM): sausage and eggs, sometimes toast. Diet sun drop. Claims this sausage brand is less greasy.OR 1 Pop Tart Snk ( AM): none  L ( PM): pkg of cheese crackers or fig newtons Snk ( PM): no more doritos  D ( PM): mostly baked foods, according to pt. Mostly veg for sides- carrots, corn, pinto beans, potatoes. Snk ( PM): no more fun size m and ms pack Beverages: diet sun drop, water  Usual physical activity: more lately. She states she is cleaning her house more, playing with their new puppy and willing to try the 7 Minute Workout daily.  Progress Towards Goal(s):  In progress.   Nutritional Diagnosis:  [place]-3.3 Overweight/obesity As related to meal skipping, high fat food choices (doritos), low physical activity recently.  As evidenced by diet recall, wt loss of 10 pounds noted since last visit in July    Intervention:Reviewed her progress and encouraged her to continue with any activity level she could. Commended her on more fresh fruits and fewer chips.    Goals:  Consider the 7 Minute Workout at least once a day, 5 days a week.  Continue eating 2-3 times daily and consider aiming for 2 Carb Choices (30 grams) +/- 1 per meal  Continue including a protein- eggs, peanut butter, milk, meat, etc. At each meal as able  Continue using the MyPlate at supper time. Plate should be half non-starchy veggies, one quarter  starch, one quarter meat.  Continue choosing less fried food in favor of baked or grilled, smaller portions of starches like potatoes, rinsing canned vegetables well to reduce salt content.  Play with your new puppy!    Handouts given during visit include: the 7 Minute Workout Handout  Monitoring/Evaluation:  Dietary intake, exercise, portion control, meal pattern, and body weight in 6 week(s).

## 2013-01-24 NOTE — Patient Instructions (Signed)
Goals:  Consider the 7 Minute Workout at least once a day, 5 days a week.  Continue eating 2-3 times daily and consider aiming for 2 Carb Choices (30 grams) +/- 1 per meal  Continue including a protein- eggs, peanut butter, milk, meat, etc. At each meal as able  Continue using the MyPlate at supper time. Plate should be half non-starchy veggies, one quarter starch, one quarter meat.  Continue choosing less fried food in favor of baked or grilled, smaller portions of starches like potatoes, rinsing canned vegetables well to reduce salt content.  Play with your new puppy!

## 2013-03-06 ENCOUNTER — Encounter: Payer: Medicaid Other | Admitting: Physical Medicine and Rehabilitation

## 2013-03-07 ENCOUNTER — Ambulatory Visit: Payer: Medicaid Other | Admitting: *Deleted

## 2013-05-01 ENCOUNTER — Ambulatory Visit: Payer: Medicaid Other | Attending: Anesthesiology | Admitting: Physical Therapy

## 2013-05-09 ENCOUNTER — Ambulatory Visit: Payer: Medicaid Other | Attending: Anesthesiology | Admitting: Physical Therapy

## 2013-05-09 DIAGNOSIS — R5381 Other malaise: Secondary | ICD-10-CM | POA: Insufficient documentation

## 2013-05-09 DIAGNOSIS — M545 Low back pain, unspecified: Secondary | ICD-10-CM | POA: Insufficient documentation

## 2013-05-09 DIAGNOSIS — R269 Unspecified abnormalities of gait and mobility: Secondary | ICD-10-CM | POA: Insufficient documentation

## 2013-05-09 DIAGNOSIS — IMO0001 Reserved for inherently not codable concepts without codable children: Secondary | ICD-10-CM | POA: Insufficient documentation

## 2013-05-09 DIAGNOSIS — R293 Abnormal posture: Secondary | ICD-10-CM | POA: Insufficient documentation

## 2013-05-09 DIAGNOSIS — R262 Difficulty in walking, not elsewhere classified: Secondary | ICD-10-CM | POA: Insufficient documentation

## 2013-05-16 ENCOUNTER — Ambulatory Visit: Payer: Medicaid Other | Admitting: *Deleted

## 2013-05-23 ENCOUNTER — Emergency Department (HOSPITAL_COMMUNITY): Payer: Medicaid Other

## 2013-05-23 ENCOUNTER — Encounter (HOSPITAL_COMMUNITY): Payer: Self-pay | Admitting: Emergency Medicine

## 2013-05-23 ENCOUNTER — Emergency Department (HOSPITAL_COMMUNITY)
Admission: EM | Admit: 2013-05-23 | Discharge: 2013-05-23 | Disposition: A | Discharge status: Home / Self Care | Payer: Medicaid Other | Attending: Emergency Medicine | Admitting: Emergency Medicine

## 2013-05-23 DIAGNOSIS — Z791 Long term (current) use of non-steroidal anti-inflammatories (NSAID): Secondary | ICD-10-CM | POA: Insufficient documentation

## 2013-05-23 DIAGNOSIS — K219 Gastro-esophageal reflux disease without esophagitis: Secondary | ICD-10-CM | POA: Insufficient documentation

## 2013-05-23 DIAGNOSIS — F209 Schizophrenia, unspecified: Secondary | ICD-10-CM | POA: Insufficient documentation

## 2013-05-23 DIAGNOSIS — F411 Generalized anxiety disorder: Secondary | ICD-10-CM | POA: Insufficient documentation

## 2013-05-23 DIAGNOSIS — M129 Arthropathy, unspecified: Secondary | ICD-10-CM | POA: Insufficient documentation

## 2013-05-23 DIAGNOSIS — G8929 Other chronic pain: Secondary | ICD-10-CM | POA: Insufficient documentation

## 2013-05-23 DIAGNOSIS — F3130 Bipolar disorder, current episode depressed, mild or moderate severity, unspecified: Secondary | ICD-10-CM | POA: Insufficient documentation

## 2013-05-23 DIAGNOSIS — Z79899 Other long term (current) drug therapy: Secondary | ICD-10-CM | POA: Insufficient documentation

## 2013-05-23 DIAGNOSIS — J40 Bronchitis, not specified as acute or chronic: Secondary | ICD-10-CM

## 2013-05-23 DIAGNOSIS — Z87828 Personal history of other (healed) physical injury and trauma: Secondary | ICD-10-CM | POA: Insufficient documentation

## 2013-05-23 DIAGNOSIS — J069 Acute upper respiratory infection, unspecified: Secondary | ICD-10-CM | POA: Insufficient documentation

## 2013-05-23 DIAGNOSIS — Z8701 Personal history of pneumonia (recurrent): Secondary | ICD-10-CM | POA: Insufficient documentation

## 2013-05-23 DIAGNOSIS — F17200 Nicotine dependence, unspecified, uncomplicated: Secondary | ICD-10-CM | POA: Insufficient documentation

## 2013-05-23 DIAGNOSIS — J209 Acute bronchitis, unspecified: Secondary | ICD-10-CM | POA: Insufficient documentation

## 2013-05-23 MED ORDER — IBUPROFEN 800 MG PO TABS
800.0000 mg | ORAL_TABLET | Freq: Once | ORAL | Status: AC
  Administered 2013-05-23: 800 mg via ORAL
  Filled 2013-05-23: qty 1

## 2013-05-23 MED ORDER — DOXYCYCLINE HYCLATE 100 MG PO TABS
100.0000 mg | ORAL_TABLET | Freq: Once | ORAL | Status: AC
  Administered 2013-05-23: 100 mg via ORAL
  Filled 2013-05-23: qty 1

## 2013-05-23 MED ORDER — METHYLPREDNISOLONE SODIUM SUCC 125 MG IJ SOLR
125.0000 mg | Freq: Once | INTRAMUSCULAR | Status: AC
  Administered 2013-05-23: 125 mg via INTRAMUSCULAR
  Filled 2013-05-23: qty 2

## 2013-05-23 MED ORDER — ALBUTEROL SULFATE HFA 108 (90 BASE) MCG/ACT IN AERS
2.0000 | INHALATION_SPRAY | Freq: Once | RESPIRATORY_TRACT | Status: AC
  Administered 2013-05-23: 2 via RESPIRATORY_TRACT
  Filled 2013-05-23: qty 6.7

## 2013-05-23 MED ORDER — DOXYCYCLINE HYCLATE 100 MG PO CAPS: 100.0000 mg | ORAL_CAPSULE | Freq: Two times a day (BID) | ORAL | Status: AC

## 2013-05-23 MED ORDER — PREDNISONE 10 MG PO TABS: ORAL_TABLET | ORAL | Status: DC

## 2013-05-23 MED ORDER — PROMETHAZINE-CODEINE 6.25-10 MG/5ML PO SYRP: 5.0000 mL | ORAL_SOLUTION | Freq: Four times a day (QID) | ORAL | Status: DC | PRN

## 2013-05-23 NOTE — ED Notes (Signed)
Pt c/o np cough x 3 weeks.
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
## 2013-05-23 NOTE — Discharge Instructions (Signed)
Bronchitis PLEASE  [place] HANDS FREQUENTLY. USE MASK UNTIL SYMPTOMS RESOLVE. USE TYLENOL EVERY 4 HOURS FOR FEVER OR ACHING. TAKE MEDICATIONS AS PRESCRIBED. PROMETHAZINE COUGH MEDICATION MAY CAUSE DROWSINESS, USE WITH CAUTION. USE ALBUTEROL 2 PUFF EVERY 4 HOURS FOR WHEEZES AND CHEST CONGESTION.          WASBronchitis is swelling (inflammation) of the air tubes leading to your lungs (bronchi). This causes mucus and a cough. If the swelling gets bad, you may have trouble breathing. HOME CARE   Rest.  Drink enough fluids to keep your pee (urine) clear or pale yellow (unless you have a condition where you have to watch how much you drink).  Only take medicine as told by your doctor. If you were given antibiotic medicines, finish them even if you start to feel better.  Avoid smoke, irritating chemicals, and strong smells. These make the problem worse. Quit smoking if you smoke. This helps your lungs heal faster.  Use a cool mist humidifier. Change the water in the humidifier every day. You can also sit in the bathroom with hot shower running for 5 10 minutes. Keep the door closed.  See your health care provider as told.  Wash your hands often. GET HELP IF: Your problems do not get better after 1 week. GET HELP RIGHT AWAY IF:   Your fever gets worse.  You have chills.  Your chest hurts.  Your problems breathing get worse.  You have blood in your mucus.  You pass out (faint).  You feel lightheaded.  You have a bad headache.  You throw up (vomit) again and again. MAKE SURE YOU:  Understand these instructions.  Will watch your condition.  Will get help right away if you are not doing well or get worse. Document Released: 09/07/2007 Document Revised: 01/09/2013 Document Reviewed: 11/13/2012 [facility] Patient Information 2014 [place], [place].  Upper Respiratory Infection, Adult An upper respiratory infection (URI) is also sometimes known as the common cold. The upper respiratory  tract includes the nose, sinuses, throat, trachea, and bronchi. Bronchi are the airways leading to the lungs. Most people improve within 1 week, but symptoms can last up to 2 weeks. A residual cough may last even longer.  CAUSES Many different viruses can infect the tissues lining the upper respiratory tract. The tissues become irritated and inflamed and often become very moist. Mucus production is also common. A cold is contagious. You can easily spread the virus to others by oral contact. This includes kissing, sharing a glass, coughing, or sneezing. Touching your mouth or nose and then touching a surface, which is then touched by another person, can also spread the virus. SYMPTOMS  Symptoms typically develop 1 to 3 days after you come in contact with a cold virus. Symptoms vary from person to person. They may include:  Runny nose.  Sneezing.  Nasal congestion.  Sinus irritation.  Sore throat.  Loss of voice (laryngitis).  Cough.  Fatigue.  Muscle aches.  Loss of appetite.  Headache.  Low-grade fever. DIAGNOSIS  You might diagnose your own cold based on familiar symptoms, since most people get a cold 2 to 3 times a year. Your caregiver can confirm this based on your exam. Most importantly, your caregiver can check that your symptoms are not due to another disease such as strep throat, sinusitis, pneumonia, asthma, or epiglottitis. Blood tests, throat tests, and X-rays are not necessary to diagnose a common cold, but they may sometimes be helpful in excluding other more serious diseases. Your  caregiver will decide if any further tests are required. RISKS AND COMPLICATIONS  You may be at risk for a more severe case of the common cold if you smoke cigarettes, have chronic heart disease (such as heart failure) or lung disease (such as asthma), or if you have a weakened immune system. The very young and very old are also at risk for more serious infections. Bacterial sinusitis, middle  ear infections, and bacterial pneumonia can complicate the common cold. The common cold can worsen asthma and chronic obstructive pulmonary disease (COPD). Sometimes, these complications can require emergency medical care and may be life-threatening. PREVENTION  The best way to protect against getting a cold is to practice good hygiene. Avoid oral or hand contact with people with cold symptoms. Wash your hands often if contact occurs. There is no clear evidence that vitamin C, vitamin E, echinacea, or exercise reduces the chance of developing a cold. However, it is always recommended to get plenty of rest and practice good nutrition. TREATMENT  Treatment is directed at relieving symptoms. There is no cure. Antibiotics are not effective, because the infection is caused by a virus, not by bacteria. Treatment may include:  Increased fluid intake. Sports drinks offer valuable electrolytes, sugars, and fluids.  Breathing heated mist or steam (vaporizer or shower).  Eating chicken soup or other clear broths, and maintaining good nutrition.  Getting plenty of rest.  Using gargles or lozenges for comfort.  Controlling fevers with ibuprofen or acetaminophen as directed by your caregiver.  Increasing usage of your inhaler if you have asthma. Zinc gel and zinc lozenges, taken in the first 24 hours of the common cold, can shorten the duration and lessen the severity of symptoms. Pain medicines may help with fever, muscle aches, and throat pain. A variety of non-prescription medicines are available to treat congestion and runny nose. Your caregiver can make recommendations and may suggest nasal or lung inhalers for other symptoms.  HOME CARE INSTRUCTIONS   Only take over-the-counter or prescription medicines for pain, discomfort, or fever as directed by your caregiver.  Use a warm mist humidifier or inhale steam from a shower to increase air moisture. This may keep secretions moist and make it easier to  breathe.  Drink enough water and fluids to keep your urine clear or pale yellow.  Rest as needed.  Return to work when your temperature has returned to normal or as your caregiver advises. You may need to stay home longer to avoid infecting others. You can also use a face mask and careful hand washing to prevent spread of the virus. SEEK MEDICAL CARE IF:   After the first few days, you feel you are getting worse rather than better.  You need your caregiver's advice about medicines to control symptoms.  You develop chills, worsening shortness of breath, or brown or red sputum. These may be signs of pneumonia.  You develop yellow or brown nasal discharge or pain in the face, especially when you bend forward. These may be signs of sinusitis.  You develop a fever, swollen neck glands, pain with swallowing, or white areas in the back of your throat. These may be signs of strep throat. SEEK IMMEDIATE MEDICAL CARE IF:   You have a fever.  You develop severe or persistent headache, ear pain, sinus pain, or chest pain.  You develop wheezing, a prolonged cough, cough up blood, or have a change in your usual mucus (if you have chronic lung disease).  You develop sore muscles  or a stiff neck. Document Released: 09/14/2000 Document Revised: 06/13/2011 Document Reviewed: 07/23/2010 [facility] Patient Information 2014 [place], [place].

## 2013-05-27 NOTE — ED Provider Notes (Signed)
Medical screening examination/treatment/procedure(s) were performed by non-physician practitioner and as supervising physician I was immediately available for consultation/collaboration.  EKG Interpretation   None        Jasper Riling. Alvino Chapel, [place] 05/27/13 [phone]

## 2013-07-19 ENCOUNTER — Other Ambulatory Visit: Payer: Self-pay | Admitting: Gastroenterology

## 2013-11-08 ENCOUNTER — Emergency Department (HOSPITAL_COMMUNITY)
Admission: EM | Admit: 2013-11-08 | Discharge: 2013-11-09 | Disposition: A | Discharge status: Home / Self Care | Payer: Medicaid Other | Attending: Emergency Medicine | Admitting: Emergency Medicine

## 2013-11-08 ENCOUNTER — Encounter (HOSPITAL_COMMUNITY): Payer: Self-pay | Admitting: Emergency Medicine

## 2013-11-08 DIAGNOSIS — F319 Bipolar disorder, unspecified: Secondary | ICD-10-CM | POA: Insufficient documentation

## 2013-11-08 DIAGNOSIS — G8911 Acute pain due to trauma: Secondary | ICD-10-CM | POA: Diagnosis not present

## 2013-11-08 DIAGNOSIS — M545 Low back pain, unspecified: Secondary | ICD-10-CM | POA: Diagnosis not present

## 2013-11-08 DIAGNOSIS — Z79899 Other long term (current) drug therapy: Secondary | ICD-10-CM | POA: Insufficient documentation

## 2013-11-08 DIAGNOSIS — R52 Pain, unspecified: Secondary | ICD-10-CM

## 2013-11-08 DIAGNOSIS — M129 Arthropathy, unspecified: Secondary | ICD-10-CM | POA: Diagnosis not present

## 2013-11-08 DIAGNOSIS — M542 Cervicalgia: Secondary | ICD-10-CM | POA: Insufficient documentation

## 2013-11-08 DIAGNOSIS — F209 Schizophrenia, unspecified: Secondary | ICD-10-CM | POA: Insufficient documentation

## 2013-11-08 DIAGNOSIS — K219 Gastro-esophageal reflux disease without esophagitis: Secondary | ICD-10-CM | POA: Insufficient documentation

## 2013-11-08 DIAGNOSIS — Z041 Encounter for examination and observation following transport accident: Secondary | ICD-10-CM

## 2013-11-08 DIAGNOSIS — F17200 Nicotine dependence, unspecified, uncomplicated: Secondary | ICD-10-CM | POA: Diagnosis not present

## 2013-11-08 DIAGNOSIS — Z043 Encounter for examination and observation following other accident: Secondary | ICD-10-CM

## 2013-11-08 DIAGNOSIS — G8929 Other chronic pain: Secondary | ICD-10-CM | POA: Insufficient documentation

## 2013-11-08 DIAGNOSIS — F411 Generalized anxiety disorder: Secondary | ICD-10-CM | POA: Diagnosis not present

## 2013-11-08 DIAGNOSIS — Z8701 Personal history of pneumonia (recurrent): Secondary | ICD-10-CM | POA: Diagnosis not present

## 2013-11-08 MED ORDER — DIAZEPAM 5 MG PO TABS
5.0000 mg | ORAL_TABLET | Freq: Once | ORAL | Status: AC
  Administered 2013-11-08: 5 mg via ORAL
  Filled 2013-11-08: qty 1

## 2013-11-08 MED ORDER — TRAMADOL HCL 50 MG PO TABS
100.0000 mg | ORAL_TABLET | Freq: Once | ORAL | Status: AC
  Administered 2013-11-08: 100 mg via ORAL
  Filled 2013-11-08: qty 2

## 2013-11-08 MED ORDER — KETOROLAC TROMETHAMINE 10 MG PO TABS
10.0000 mg | ORAL_TABLET | Freq: Once | ORAL | Status: AC
  Administered 2013-11-08: 10 mg via ORAL
  Filled 2013-11-08: qty 1

## 2013-11-08 NOTE — ED Notes (Signed)
Went to [facility] on 11/04/2013.  Woke up this morning with pain head.  Pt has multiple complaints.  Having pain left leg.  Rates pain 10.  Took percocet one tablet this am.

## 2013-11-08 NOTE — Discharge Instructions (Signed)
I have reviewed the CT scans done on August 3 at the [facility]. No acute changes noted. It is of note that you have degenerative disc disease of multiple areas. Please notify your pain management specialists, and or your [facility] access physician for assistance with management of these different pains. Your vital signs are stable at this time.
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
## 2013-11-09 NOTE — ED Provider Notes (Signed)
Medical screening examination/treatment/procedure(s) were performed by non-physician practitioner and as supervising physician I was immediately available for consultation/collaboration.  Richarda Blade, MD 11/09/13 [phone]

## 2013-11-13 ENCOUNTER — Encounter: Payer: Self-pay | Admitting: Cardiology

## 2013-11-13 ENCOUNTER — Encounter: Payer: Medicaid Other | Admitting: Cardiology

## 2013-11-13 NOTE — Progress Notes (Signed)
Patient canceled.  This encounter was created in error - please disregard. 

## 2013-12-03 ENCOUNTER — Other Ambulatory Visit: Payer: Self-pay | Admitting: Gastroenterology

## 2013-12-03 ENCOUNTER — Encounter: Payer: Self-pay | Admitting: Cardiology

## 2013-12-03 ENCOUNTER — Ambulatory Visit (INDEPENDENT_AMBULATORY_CARE_PROVIDER_SITE_OTHER): Payer: Medicaid Other | Admitting: Cardiology

## 2013-12-03 VITALS — BP 128/88 | HR 84 | Ht 66.0 in | Wt 158.0 lb

## 2013-12-03 DIAGNOSIS — R42 Dizziness and giddiness: Secondary | ICD-10-CM

## 2013-12-03 DIAGNOSIS — Z720 Tobacco use: Secondary | ICD-10-CM

## 2013-12-03 DIAGNOSIS — G909 Disorder of the autonomic nervous system, unspecified: Secondary | ICD-10-CM

## 2013-12-03 DIAGNOSIS — F17200 Nicotine dependence, unspecified, uncomplicated: Secondary | ICD-10-CM | POA: Diagnosis not present

## 2013-12-03 DIAGNOSIS — R002 Palpitations: Secondary | ICD-10-CM | POA: Diagnosis not present
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
## 2013-12-03 NOTE — Patient Instructions (Signed)
Your physician has requested that you have an echocardiogram. Echocardiography is a painless test that uses sound waves to create images of your heart. It provides your doctor with information about the size and shape of your heart and how well your heart's chambers and valves are working. This procedure takes approximately one hour. There are no restrictions for this procedure. Office will contact with results via phone or letter.   Continue all current medications. Follow up pending test results   

## 2013-12-03 NOTE — Assessment & Plan Note (Signed)
Smoking cessation is clearly indicated, I did discuss this with her as a way of reducing her cardiovascular risk overall.

## 2013-12-03 NOTE — Assessment & Plan Note (Signed)
To be perfectly frank, I am not familiar with the type of testing that the patient had in July regarding her autonomic nervous system function (discussed above), and I made this clear to the patient and her significant other today. It is fortunate that she follows with Dr. Merlene Laughter who is a neurologist and able to manage these issues going forward. My review of the complicated report does not find any major indication for cardiovascular testing at this time, although an echocardiogram would be reasonable to ensure that she still has normal LV function and no evidence of associated cardiomyopathy - this will be obtained. Her ECG today is normal. She had a negative ischemic workup in 2012, and does not endorse any angina symptoms.

## 2013-12-25 ENCOUNTER — Other Ambulatory Visit (INDEPENDENT_AMBULATORY_CARE_PROVIDER_SITE_OTHER): Payer: Medicaid Other

## 2013-12-25 ENCOUNTER — Other Ambulatory Visit: Payer: Self-pay

## 2013-12-25 DIAGNOSIS — R002 Palpitations: Secondary | ICD-10-CM

## 2013-12-25 DIAGNOSIS — R42 Dizziness and giddiness: Secondary | ICD-10-CM

## 2013-12-27 ENCOUNTER — Telehealth: Payer: Self-pay | Admitting: *Deleted

## 2013-12-27 NOTE — Telephone Encounter (Signed)
Message copied by Merlene Laughter on Fri Dec 27, 2013  4:07 PM ------      Message from: MCDOWELL, Aloha Gell      Created: Wed Dec 25, 2013  2:52 PM       Reviewed. Please let her know that LVEF is normal as documented previously, no major valvular abnormalities. This is reassuring overall. She does not require further cardiac workup at this time. Keep followup with Dr. Merlene Laughter. ------

## 2013-12-31 NOTE — Telephone Encounter (Signed)
Patient informed. 

## 2014-01-16 ENCOUNTER — Encounter: Payer: Self-pay | Admitting: Adult Health

## 2014-01-16 ENCOUNTER — Ambulatory Visit (INDEPENDENT_AMBULATORY_CARE_PROVIDER_SITE_OTHER): Payer: Medicaid Other | Admitting: Adult Health

## 2014-01-16 VITALS — BP 128/78 | Ht 65.0 in | Wt 160.0 lb

## 2014-01-16 DIAGNOSIS — N898 Other specified noninflammatory disorders of vagina: Secondary | ICD-10-CM | POA: Insufficient documentation

## 2014-01-16 DIAGNOSIS — B379 Candidiasis, unspecified: Secondary | ICD-10-CM

## 2014-01-16 HISTORY — DX: Candidiasis, unspecified: B37.9

## 2014-01-16 HISTORY — DX: Other specified noninflammatory disorders of vagina: N89.8

## 2014-01-16 LAB — POCT WET PREP (WET MOUNT): WBC, Wet Prep HPF POC: POSITIVE

## 2014-01-16 MED ORDER — FLUCONAZOLE 150 MG PO TABS: ORAL_TABLET | ORAL | Status: DC
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
## 2014-01-16 NOTE — Patient Instructions (Signed)
Monilial Vaginitis Vaginitis in a soreness, swelling and redness (inflammation) of the vagina and vulva. Monilial vaginitis is not a sexually transmitted infection. CAUSES  Yeast vaginitis is caused by yeast (candida) that is normally found in your vagina. With a yeast infection, the candida has overgrown in number to a point that upsets the chemical balance. SYMPTOMS   White, thick vaginal discharge.  Swelling, itching, redness and irritation of the vagina and possibly the lips of the vagina (vulva).  Burning or painful urination.  Painful intercourse. DIAGNOSIS  Things that may contribute to monilial vaginitis are:  Postmenopausal and virginal states.  Pregnancy.  Infections.  Being tired, sick or stressed, especially if you had monilial vaginitis in the past.  Diabetes. Good control will help lower the chance.  Birth control pills.  Tight fitting garments.  Using bubble bath, feminine sprays, douches or deodorant tampons.  Taking certain medications that kill germs (antibiotics).  Sporadic recurrence can occur if you become ill. TREATMENT  Your caregiver will give you medication.  There are several kinds of anti monilial vaginal creams and suppositories specific for monilial vaginitis. For recurrent yeast infections, use a suppository or cream in the vagina 2 times a week, or as directed.  Anti-monilial or steroid cream for the itching or irritation of the vulva may also be used. Get your caregiver's permission.  Painting the vagina with methylene blue solution may help if the monilial cream does not work.  Eating yogurt may help prevent monilial vaginitis. HOME CARE INSTRUCTIONS   Finish all medication as prescribed.  Do not have sex until treatment is completed or after your caregiver tells you it is okay.  Take warm sitz baths.  Do not douche.  Do not use tampons, especially scented ones.  Wear cotton underwear.  Avoid tight pants and panty  hose.  Tell your sexual partner that you have a yeast infection. They should go to their caregiver if they have symptoms such as mild rash or itching.  Your sexual partner should be treated as well if your infection is difficult to eliminate.  Practice safer sex. Use condoms.  Some vaginal medications cause latex condoms to fail. Vaginal medications that harm condoms are:  Cleocin cream.  Butoconazole (Femstat).  Terconazole (Terazol) vaginal suppository.  Miconazole (Monistat) (may be purchased over the counter). SEEK MEDICAL CARE IF:   You have a temperature by mouth above 102 F (38.9 C).  The infection is getting worse after 2 days of treatment.  The infection is not getting better after 3 days of treatment.  You develop blisters in or around your vagina.  You develop vaginal bleeding, and it is not your menstrual period.  You have pain when you urinate.  You develop intestinal problems.  You have pain with sexual intercourse. Document Released: 12/29/2004 Document Revised: 06/13/2011 Document Reviewed: 09/12/2008 [facility] Patient Information 2015 [place], [place]. This information is not intended to replace advice given to you by your health care provider. Make sure you discuss any questions you have with your health care provider. Take diflucan TRY ASTRO GLIDE for SEX

## 2014-01-17 ENCOUNTER — Other Ambulatory Visit: Payer: Self-pay | Admitting: Obstetrics and Gynecology

## 2014-01-17 DIAGNOSIS — Z139 Encounter for screening, unspecified: Secondary | ICD-10-CM

## 2014-01-24 ENCOUNTER — Ambulatory Visit (HOSPITAL_COMMUNITY): Payer: Medicaid Other

## 2014-02-04 ENCOUNTER — Other Ambulatory Visit: Payer: Self-pay

## 2014-02-05 ENCOUNTER — Encounter: Payer: Self-pay | Admitting: Internal Medicine

## 2014-02-05 MED ORDER — LUBIPROSTONE 24 MCG PO CAPS: 24.0000 ug | ORAL_CAPSULE | Freq: Two times a day (BID) | ORAL | Status: DC

## 2014-02-05 NOTE — Telephone Encounter (Signed)
Patient has not been seen in over two years. I will provided two months of refills but she needs to be seen in the ov within the next 8 weeks, nonurgent.

## 2014-02-05 NOTE — Telephone Encounter (Signed)
APPT MADE AND LETTER SENT  °

## 2014-02-05 NOTE — Telephone Encounter (Signed)
Stacey, please schedule ov.  

## 2014-02-06 ENCOUNTER — Telehealth: Payer: Self-pay | Admitting: Obstetrics and Gynecology

## 2014-02-06 ENCOUNTER — Ambulatory Visit (HOSPITAL_COMMUNITY)
Admission: RE | Admit: 2014-02-06 | Discharge: 2014-02-06 | Disposition: A | Discharge status: Home / Self Care | Payer: Medicaid Other | Source: Ambulatory Visit | Attending: Obstetrics and Gynecology | Admitting: Obstetrics and Gynecology

## 2014-02-06 DIAGNOSIS — Z139 Encounter for screening, unspecified: Secondary | ICD-10-CM

## 2014-02-06 DIAGNOSIS — Z1231 Encounter for screening mammogram for malignant neoplasm of breast: Secondary | ICD-10-CM | POA: Diagnosis present

## 2014-02-06 NOTE — Telephone Encounter (Signed)
Pt stated that she was told to have a colonoscopy done by Dr. Glo Herring at her last visit. I advised the pt that she would need her screening colonoscopy at age 48 unless she is having problems. Pt verbalized understanding.

## 2014-03-02 ENCOUNTER — Emergency Department (HOSPITAL_COMMUNITY)
Admission: EM | Admit: 2014-03-02 | Discharge: 2014-03-02 | Disposition: A | Discharge status: Home / Self Care | Payer: Medicaid Other | Attending: Emergency Medicine | Admitting: Emergency Medicine

## 2014-03-02 ENCOUNTER — Encounter (HOSPITAL_COMMUNITY): Payer: Self-pay

## 2014-03-02 DIAGNOSIS — Z791 Long term (current) use of non-steroidal anti-inflammatories (NSAID): Secondary | ICD-10-CM | POA: Insufficient documentation

## 2014-03-02 DIAGNOSIS — Z87828 Personal history of other (healed) physical injury and trauma: Secondary | ICD-10-CM | POA: Insufficient documentation

## 2014-03-02 DIAGNOSIS — F419 Anxiety disorder, unspecified: Secondary | ICD-10-CM | POA: Insufficient documentation

## 2014-03-02 DIAGNOSIS — Z8701 Personal history of pneumonia (recurrent): Secondary | ICD-10-CM | POA: Diagnosis not present

## 2014-03-02 DIAGNOSIS — M1990 Unspecified osteoarthritis, unspecified site: Secondary | ICD-10-CM | POA: Diagnosis not present

## 2014-03-02 DIAGNOSIS — M545 Low back pain, unspecified: Secondary | ICD-10-CM

## 2014-03-02 DIAGNOSIS — Z8619 Personal history of other infectious and parasitic diseases: Secondary | ICD-10-CM | POA: Diagnosis not present

## 2014-03-02 DIAGNOSIS — G8929 Other chronic pain: Secondary | ICD-10-CM | POA: Insufficient documentation

## 2014-03-02 DIAGNOSIS — Z720 Tobacco use: Secondary | ICD-10-CM | POA: Diagnosis not present

## 2014-03-02 DIAGNOSIS — Z7951 Long term (current) use of inhaled steroids: Secondary | ICD-10-CM | POA: Diagnosis not present

## 2014-03-02 DIAGNOSIS — M797 Fibromyalgia: Secondary | ICD-10-CM | POA: Insufficient documentation

## 2014-03-02 DIAGNOSIS — F319 Bipolar disorder, unspecified: Secondary | ICD-10-CM | POA: Insufficient documentation

## 2014-03-02 DIAGNOSIS — L02214 Cutaneous abscess of groin: Secondary | ICD-10-CM | POA: Insufficient documentation

## 2014-03-02 DIAGNOSIS — K219 Gastro-esophageal reflux disease without esophagitis: Secondary | ICD-10-CM | POA: Diagnosis not present

## 2014-03-02 DIAGNOSIS — Z79899 Other long term (current) drug therapy: Secondary | ICD-10-CM | POA: Insufficient documentation

## 2014-03-02 MED ORDER — FLUCONAZOLE 200 MG PO TABS
ORAL_TABLET | ORAL | Status: DC
Start: 2014-03-02 — End: 2015-07-14

## 2014-03-02 MED ORDER — LIDOCAINE HCL (PF) 1 % IJ SOLN
INTRAMUSCULAR | Status: AC
  Administered 2014-03-02: 5 mL via INTRADERMAL
  Filled 2014-03-02: qty 5

## 2014-03-02 MED ORDER — LIDOCAINE HCL (PF) 1 % IJ SOLN
5.0000 mL | Freq: Once | INTRAMUSCULAR | Status: AC
  Administered 2014-03-02: 5 mL via INTRADERMAL

## 2014-03-02 MED ORDER — KETOROLAC TROMETHAMINE 60 MG/2ML IM SOLN
60.0000 mg | Freq: Once | INTRAMUSCULAR | Status: AC
  Administered 2014-03-02: 60 mg via INTRAMUSCULAR
  Filled 2014-03-02: qty 2

## 2014-03-02 MED ORDER — SULFAMETHOXAZOLE-TRIMETHOPRIM 800-160 MG PO TABS: 1.0000 | ORAL_TABLET | Freq: Two times a day (BID) | ORAL | Status: DC
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
## 2014-03-02 NOTE — Discharge Instructions (Signed)
Abscess °An abscess (boil or furuncle) is an infected area on or under the skin. This area is filled with yellowish-white fluid (pus) and other material (debris). °HOME CARE  °· Only take medicines as told by your doctor. °· If you were given antibiotic medicine, take it as directed. Finish the medicine even if you start to feel better. °· If gauze is used, follow your doctor's directions for changing the gauze. °· To avoid spreading the infection: °¨ Keep your abscess covered with a bandage. °¨ Wash your hands well. °¨ Do not share personal care items, towels, or whirlpools with others. °¨ Avoid skin contact with others. °· Keep your skin and clothes clean around the abscess. °· Keep all doctor visits as told. °GET HELP RIGHT AWAY IF:  °· You have more pain, puffiness (swelling), or redness in the wound site. °· You have more fluid or blood coming from the wound site. °· You have muscle aches, chills, or you feel sick. °· You have a fever. °MAKE SURE YOU:  °· Understand these instructions. °· Will watch your condition. °· Will get help right away if you are not doing well or get worse. °Document Released: 09/07/2007 Document Revised: 09/20/2011 Document Reviewed: 06/03/2011 °[employer]® Patient Information ©2015 [employer]. This information is not intended to replace advice given to you by your health care provider. Make sure you discuss any questions you have with your health care provider. ° °

## 2014-03-02 NOTE — ED Notes (Signed)
Patient states that she is taking percocet at home and they are not helping and she would like a shot. She states that she is supposed to talk to MD at the pain center to change her medications.
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
## 2014-03-14 ENCOUNTER — Ambulatory Visit: Payer: Medicaid Other | Admitting: Gastroenterology

## 2014-04-21 ENCOUNTER — Ambulatory Visit (INDEPENDENT_AMBULATORY_CARE_PROVIDER_SITE_OTHER): Payer: Medicaid Other | Admitting: Gastroenterology

## 2014-04-21 ENCOUNTER — Encounter: Payer: Self-pay | Admitting: Gastroenterology

## 2014-04-21 VITALS — BP 170/100 | HR 88 | Temp 97.9°F | Ht 65.0 in | Wt 132.4 lb

## 2014-04-21 DIAGNOSIS — K219 Gastro-esophageal reflux disease without esophagitis: Secondary | ICD-10-CM

## 2014-04-21 DIAGNOSIS — K5900 Constipation, unspecified: Secondary | ICD-10-CM

## 2014-04-21 MED ORDER — PANTOPRAZOLE SODIUM 40 MG PO TBEC: 40.0000 mg | DELAYED_RELEASE_TABLET | Freq: Every day | ORAL | Status: DC

## 2014-04-21 MED ORDER — LUBIPROSTONE 24 MCG PO CAPS: 24.0000 ug | ORAL_CAPSULE | Freq: Two times a day (BID) | ORAL | Status: DC
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
## 2014-04-21 NOTE — Patient Instructions (Signed)
Continue Protonix once daily, 30 minutes before breakfast.   Continue Amitiza as needed.   Stop taking the hydroxycut. Contact your primary care doctor about your blood pressure. Seek medical attention if blurred vision, severe headache, shortness of breath, chest pain.   We will see you in 1 year!

## 2014-04-22 NOTE — Assessment & Plan Note (Signed)
Continue Amitiza 24 mcg as needed. BP significantly elevated today but without concerning clinical signs. Query secondary to "diet pills". Instructed to stop taking this, call PCP. Seek medical attention if any changes. 1 year follow-up for constipation or sooner if needed.

## 2014-04-22 NOTE — Assessment & Plan Note (Signed)
Controlled with Protonix once daily. Continue current dosing regimen and return in 1 year.

## 2014-04-24 NOTE — Progress Notes (Signed)
cc'ed to pcp °

## 2014-04-30 ENCOUNTER — Telehealth: Payer: Self-pay | Admitting: *Deleted

## 2014-04-30 NOTE — Telephone Encounter (Signed)
Pt coming in for ov  

## 2014-05-02 ENCOUNTER — Ambulatory Visit (INDEPENDENT_AMBULATORY_CARE_PROVIDER_SITE_OTHER): Payer: Medicaid Other | Admitting: Obstetrics and Gynecology

## 2014-05-02 VITALS — BP 96/60 | Ht 65.0 in | Wt 135.0 lb

## 2014-05-02 DIAGNOSIS — F418 Other specified anxiety disorders: Secondary | ICD-10-CM
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
## 2014-05-12 ENCOUNTER — Emergency Department (HOSPITAL_COMMUNITY)
Admission: EM | Admit: 2014-05-12 | Discharge: 2014-05-12 | Discharge status: Against Medical Advice | Payer: Medicaid Other | Attending: Emergency Medicine | Admitting: Emergency Medicine

## 2014-05-12 ENCOUNTER — Encounter (HOSPITAL_COMMUNITY): Payer: Self-pay | Admitting: *Deleted

## 2014-05-12 DIAGNOSIS — G8929 Other chronic pain: Secondary | ICD-10-CM | POA: Diagnosis not present

## 2014-05-12 DIAGNOSIS — M545 Low back pain: Secondary | ICD-10-CM | POA: Diagnosis present

## 2014-05-12 DIAGNOSIS — M542 Cervicalgia: Secondary | ICD-10-CM | POA: Diagnosis not present

## 2014-05-12 DIAGNOSIS — Z720 Tobacco use: Secondary | ICD-10-CM | POA: Diagnosis not present

## 2014-05-12 NOTE — ED Notes (Signed)
Registration secretary stated pt was leaving
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
## 2014-06-10 DIAGNOSIS — M549 Dorsalgia, unspecified: Secondary | ICD-10-CM | POA: Insufficient documentation

## 2014-06-25 DIAGNOSIS — R200 Anesthesia of skin: Secondary | ICD-10-CM | POA: Insufficient documentation

## 2014-06-25 DIAGNOSIS — R42 Dizziness and giddiness: Secondary | ICD-10-CM

## 2014-06-25 DIAGNOSIS — R202 Paresthesia of skin: Secondary | ICD-10-CM | POA: Insufficient documentation

## 2014-07-08 ENCOUNTER — Ambulatory Visit
Admit: 2014-07-08 | Disposition: A | Discharge status: Home / Self Care | Payer: Self-pay | Attending: Neurology | Admitting: Neurology

## 2014-07-28 ENCOUNTER — Emergency Department (HOSPITAL_COMMUNITY)
Admission: EM | Admit: 2014-07-28 | Discharge: 2014-07-28 | Disposition: A | Discharge status: Home / Self Care | Payer: Medicaid Other | Attending: Emergency Medicine | Admitting: Emergency Medicine

## 2014-07-28 ENCOUNTER — Encounter (HOSPITAL_COMMUNITY): Payer: Self-pay | Admitting: Emergency Medicine

## 2014-07-28 DIAGNOSIS — K219 Gastro-esophageal reflux disease without esophagitis: Secondary | ICD-10-CM | POA: Insufficient documentation

## 2014-07-28 DIAGNOSIS — Z791 Long term (current) use of non-steroidal anti-inflammatories (NSAID): Secondary | ICD-10-CM | POA: Insufficient documentation

## 2014-07-28 DIAGNOSIS — F319 Bipolar disorder, unspecified: Secondary | ICD-10-CM | POA: Insufficient documentation

## 2014-07-28 DIAGNOSIS — Z8701 Personal history of pneumonia (recurrent): Secondary | ICD-10-CM | POA: Insufficient documentation

## 2014-07-28 DIAGNOSIS — G8929 Other chronic pain: Secondary | ICD-10-CM | POA: Insufficient documentation

## 2014-07-28 DIAGNOSIS — M1990 Unspecified osteoarthritis, unspecified site: Secondary | ICD-10-CM | POA: Insufficient documentation

## 2014-07-28 DIAGNOSIS — Z720 Tobacco use: Secondary | ICD-10-CM | POA: Insufficient documentation

## 2014-07-28 DIAGNOSIS — Z79899 Other long term (current) drug therapy: Secondary | ICD-10-CM | POA: Insufficient documentation

## 2014-07-28 DIAGNOSIS — F419 Anxiety disorder, unspecified: Secondary | ICD-10-CM | POA: Insufficient documentation

## 2014-07-28 DIAGNOSIS — M797 Fibromyalgia: Secondary | ICD-10-CM | POA: Insufficient documentation

## 2014-07-28 DIAGNOSIS — Z8619 Personal history of other infectious and parasitic diseases: Secondary | ICD-10-CM | POA: Insufficient documentation

## 2014-07-28 DIAGNOSIS — Z87828 Personal history of other (healed) physical injury and trauma: Secondary | ICD-10-CM | POA: Insufficient documentation

## 2014-07-28 NOTE — Discharge Instructions (Signed)

## 2014-07-28 NOTE — ED Notes (Signed)
Pt reports taking 2 Percocets and 2 Xanax today.
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
## 2014-10-17 ENCOUNTER — Ambulatory Visit (INDEPENDENT_AMBULATORY_CARE_PROVIDER_SITE_OTHER): Payer: Medicaid Other | Admitting: Cardiology

## 2014-10-17 ENCOUNTER — Encounter: Payer: Self-pay | Admitting: Cardiology

## 2014-10-17 VITALS — BP 102/68 | HR 71 | Ht 65.0 in | Wt 134.0 lb

## 2014-10-17 DIAGNOSIS — R002 Palpitations: Secondary | ICD-10-CM

## 2014-10-17 DIAGNOSIS — Z720 Tobacco use: Secondary | ICD-10-CM

## 2014-10-17 NOTE — Patient Instructions (Signed)
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
## 2014-10-31 ENCOUNTER — Other Ambulatory Visit: Payer: Self-pay | Admitting: Gastroenterology

## 2014-11-19 ENCOUNTER — Other Ambulatory Visit: Payer: Self-pay

## 2014-12-09 ENCOUNTER — Other Ambulatory Visit: Payer: Self-pay | Admitting: Family Medicine

## 2014-12-09 DIAGNOSIS — R519 Headache, unspecified: Secondary | ICD-10-CM | POA: Diagnosis present

## 2015-01-21 ENCOUNTER — Other Ambulatory Visit: Payer: Self-pay | Admitting: Obstetrics and Gynecology

## 2015-01-21 DIAGNOSIS — Z1231 Encounter for screening mammogram for malignant neoplasm of breast: Secondary | ICD-10-CM

## 2015-02-09 ENCOUNTER — Ambulatory Visit (HOSPITAL_COMMUNITY)
Admission: RE | Admit: 2015-02-09 | Discharge: 2015-02-09 | Disposition: A | Discharge status: Home / Self Care | Payer: Medicaid Other | Source: Ambulatory Visit | Attending: Obstetrics and Gynecology | Admitting: Obstetrics and Gynecology

## 2015-02-09 DIAGNOSIS — Z1231 Encounter for screening mammogram for malignant neoplasm of breast: Secondary | ICD-10-CM | POA: Diagnosis not present

## 2015-03-24 ENCOUNTER — Encounter: Payer: Self-pay | Admitting: Internal Medicine

## 2015-05-15 ENCOUNTER — Ambulatory Visit (INDEPENDENT_AMBULATORY_CARE_PROVIDER_SITE_OTHER): Payer: Medicaid Other | Admitting: Internal Medicine

## 2015-05-15 ENCOUNTER — Encounter: Payer: Self-pay | Admitting: Internal Medicine

## 2015-05-15 VITALS — BP 126/82 | HR 99 | Temp 98.2°F | Ht 65.0 in | Wt 141.8 lb

## 2015-05-15 DIAGNOSIS — K5900 Constipation, unspecified: Secondary | ICD-10-CM | POA: Diagnosis not present

## 2015-05-15 DIAGNOSIS — K219 Gastro-esophageal reflux disease without esophagitis: Secondary | ICD-10-CM

## 2015-05-15 DIAGNOSIS — R1310 Dysphagia, unspecified: Secondary | ICD-10-CM

## 2015-05-15 NOTE — Patient Instructions (Addendum)
Continue Amitiza -  Try one gel cap every other day for constipation  Continue Protonix 40 mg daily   Please cut down on carbonated, caffeinated soft drink use by least 50%  Barium pill esophogram to evaluate swallowing difficulties  Further recommendations to follow
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
## 2015-05-18 ENCOUNTER — Other Ambulatory Visit: Payer: Self-pay

## 2015-05-18 DIAGNOSIS — R1310 Dysphagia, unspecified: Secondary | ICD-10-CM

## 2015-05-20 ENCOUNTER — Ambulatory Visit (HOSPITAL_COMMUNITY)
Admission: RE | Admit: 2015-05-20 | Discharge: 2015-05-20 | Disposition: A | Discharge status: Home / Self Care | Payer: Medicaid Other | Source: Ambulatory Visit | Attending: Internal Medicine | Admitting: Internal Medicine

## 2015-05-20 ENCOUNTER — Other Ambulatory Visit: Payer: Self-pay | Admitting: Orthopedic Surgery

## 2015-05-20 DIAGNOSIS — M546 Pain in thoracic spine: Secondary | ICD-10-CM

## 2015-05-20 DIAGNOSIS — R1310 Dysphagia, unspecified: Secondary | ICD-10-CM | POA: Diagnosis not present

## 2015-05-21 ENCOUNTER — Telehealth: Payer: Self-pay | Admitting: Internal Medicine

## 2015-05-21 NOTE — Telephone Encounter (Signed)
[phone] PATIENT WANTS TO CHANGE TO MOVANTIC.  AMITITEZA NOT WORKING

## 2015-05-21 NOTE — Telephone Encounter (Signed)
Routing to RMR- do you want to change medications?

## 2015-05-22 NOTE — Telephone Encounter (Signed)
Tried to call home number listed in chart and it has been disconnected.  Tried to call cell number listed in the chart and got NA and mailbox was full.

## 2015-05-22 NOTE — Telephone Encounter (Signed)
We can make the switch but I need to make sure renal function is normal. Please do a  basic metabolic profile and then will see

## 2015-05-25 ENCOUNTER — Other Ambulatory Visit: Payer: Self-pay | Admitting: Internal Medicine

## 2015-05-25 ENCOUNTER — Other Ambulatory Visit: Payer: Self-pay

## 2015-05-25 DIAGNOSIS — Z79899 Other long term (current) drug therapy: Secondary | ICD-10-CM

## 2015-05-25 NOTE — Telephone Encounter (Signed)
Pt is aware. She will go tomorrow and have blood work done. Lab orders put in.

## 2015-05-26 ENCOUNTER — Other Ambulatory Visit: Payer: Self-pay

## 2015-05-26 DIAGNOSIS — R1310 Dysphagia, unspecified: Secondary | ICD-10-CM

## 2015-05-27 LAB — BASIC METABOLIC PANEL
BUN: 6 mg/dL — ABNORMAL LOW (ref 7–25)
CHLORIDE: 108 mmol/L (ref 98–110)
CO2: 27 mmol/L (ref 20–31)
Calcium: 9.6 mg/dL (ref 8.6–10.2)
Creat: 0.75 mg/dL (ref 0.50–1.10)
GLUCOSE: 86 mg/dL (ref 65–99)
Potassium: 4.2 mmol/L (ref 3.5–5.3)
Sodium: 139 mmol/L (ref 135–146)

## 2015-05-28 ENCOUNTER — Telehealth: Payer: Self-pay | Admitting: Internal Medicine

## 2015-05-28 NOTE — Telephone Encounter (Signed)
Patient came by front office this afternoon asking about her labs she had done on Tuesday.  I told her and her husband that it normally takes 7-10 business days before the labs are available and I would let the nurse know that she stopped by. [phone] or [phone]

## 2015-05-29 NOTE — Telephone Encounter (Signed)
What labs did we order?

## 2015-05-29 NOTE — Telephone Encounter (Signed)
Routing to RMR- see other phone note, pt was wanting to switch to [facility] and needed labs prior.

## 2015-06-01 NOTE — Telephone Encounter (Signed)
noted 

## 2015-06-01 NOTE — Telephone Encounter (Signed)
BMP to check renal function prior to starting movantik. Please see 05/21/15 phone note

## 2015-06-03 ENCOUNTER — Telehealth: Payer: Self-pay | Admitting: Internal Medicine

## 2015-06-03 NOTE — Telephone Encounter (Signed)
Pt was calling about her lab results. Please call [phone] or [phone]

## 2015-06-03 NOTE — Telephone Encounter (Signed)
Dr.Rourk- did you ever decide if the pt could start movantik? She had the blood work done for it.

## 2015-06-04 MED ORDER — NALOXEGOL OXALATE 25 MG PO TABS: 25.0000 mg | ORAL_TABLET | Freq: Every day | ORAL | Status: DC

## 2015-06-04 NOTE — Telephone Encounter (Signed)
Okay on the Movantik -  25 mg each morning; dispense 30 with 11 refills

## 2015-06-04 NOTE — Telephone Encounter (Signed)
Tried to call pt- NA 

## 2015-06-04 NOTE — Addendum Note (Signed)
Addended by: Claudina Lick on: 06/04/2015 09:26 AM   Modules accepted: Orders

## 2015-06-04 NOTE — Telephone Encounter (Signed)
movantik has been sent in, PA has been done and approved with [place] medicaid.

## 2015-06-23 ENCOUNTER — Ambulatory Visit (HOSPITAL_COMMUNITY): Payer: No Typology Code available for payment source | Admitting: Speech Pathology

## 2015-07-06 ENCOUNTER — Encounter (HOSPITAL_COMMUNITY): Payer: Medicaid Other | Admitting: Speech Pathology

## 2015-07-07 ENCOUNTER — Ambulatory Visit (HOSPITAL_COMMUNITY): Payer: Medicaid Other | Attending: Internal Medicine | Admitting: Speech Pathology

## 2015-07-07 ENCOUNTER — Encounter (HOSPITAL_COMMUNITY): Payer: Self-pay | Admitting: Speech Pathology

## 2015-07-07 DIAGNOSIS — R1319 Other dysphagia: Secondary | ICD-10-CM | POA: Diagnosis present
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
## 2015-07-14 ENCOUNTER — Encounter: Payer: Self-pay | Admitting: Obstetrics and Gynecology

## 2015-07-14 ENCOUNTER — Ambulatory Visit (INDEPENDENT_AMBULATORY_CARE_PROVIDER_SITE_OTHER): Payer: Medicaid Other | Admitting: Obstetrics and Gynecology

## 2015-07-14 VITALS — BP 130/88 | HR 84 | Ht 63.0 in | Wt 146.0 lb

## 2015-07-14 DIAGNOSIS — Z1212 Encounter for screening for malignant neoplasm of rectum: Secondary | ICD-10-CM

## 2015-07-14 DIAGNOSIS — Z7251 High risk heterosexual behavior: Secondary | ICD-10-CM

## 2015-07-14 DIAGNOSIS — N898 Other specified noninflammatory disorders of vagina: Secondary | ICD-10-CM

## 2015-07-14 DIAGNOSIS — Z0000 Encounter for general adult medical examination without abnormal findings: Secondary | ICD-10-CM | POA: Diagnosis not present

## 2015-07-14 DIAGNOSIS — Z01419 Encounter for gynecological examination (general) (routine) without abnormal findings: Secondary | ICD-10-CM | POA: Diagnosis not present

## 2015-07-14 LAB — HEMOCCULT GUIAC POC 1CARD (OFFICE): Fecal Occult Blood, POC: NEGATIVE

## 2015-07-14 LAB — POCT WET PREP (WET MOUNT)
Clue Cells Wet Prep Whiff POC: NEGATIVE
Trichomonas Wet Prep HPF POC: NEGATIVE

## 2015-07-14 MED ORDER — POLYETHYLENE GLYCOL 3350 17 GM/SCOOP PO POWD: 17.0000 g | Freq: Every day | ORAL | Status: DC

## 2015-07-14 NOTE — Patient Instructions (Addendum)
About Rectocele  Overview  A rectocele is a type of hernia which causes different degrees of bulging of the rectal tissues into the vaginal wall.  You may even notice that it presses against the vaginal wall so much that some vaginal tissues droop outside of the opening of your vagina.  Causes of Rectocele  The most common cause is childbirth.  The muscles and ligaments in the pelvis that hold up and support the female organs and vagina become stretched and weakened during labor and delivery.  The more babies you have, the more the support tissues are stretched and weakened.  Not everyone who has a baby will develop a rectocele.  Some women have stronger supporting tissue in the pelvis and may not have as much of a problem as others.  Women who have a Cesarean section usually do not get rectocele's unless they pushed a long time prior to the cesarean delivery.  Other conditions that can cause a rectocele include chronic constipation, a chronic cough, a lot of heavy lifting, and obesity.  Older women may have this problem because the loss of female hormones causes the vaginal tissue to become weaker.  Symptoms  There may not be any symptoms.  If you do have symptoms, they may include:  Pelvic pressure in the rectal area  Protrusion of the lower part of the vagina through the opening of the vagina  Constipation and trapping of the stool, making it difficult to have a bowel movement.  In severe cases, you may have to press on the lower part of your vagina to help push the stool out of you rectum.  This is called splinting to empty.  Diagnosing Rectocele  Your health care provider will ask about your symptoms and perform a pelvic exam.  S/he will ask you to bear down, pushing like you are having a bowel movement so as to see how far the lower part of the vagina protrudes into the vagina and possible outside of the vagina.  Your provider will also ask you to contract the muscles of your pelvis  (like you are stopping the stream in the middle of urinating) to determine the strength of your pelvic muscles.  Your provider may also do a rectal exam.  Treatment Options  If you do not have any symptoms, no treatment may be necessary.  Other treatment options include:  Pelvic floor exercises: Contracting the muscles in your genital area may help strengthen your muscles and support the organs.  Be sure to get proper exercise instruction from you physical therapist.  A pessary (removealbe pelvic support device) sometimes helps rectocele symptoms.  Surgery: Surgical repair may be necessary. In some cases the uterus may need to be taken out ( a hysterectomy) as well.  There are many types of surgery for pelvic support problems.  Look for physicians who specialize in repair procedures.  You can take care of yourself by:  Treating and preventing constipation  Avoiding heavy lifting, and lifting correctly (with your legs, not with you waist or back)  Treating a chronic cough or bronchitis  Not smoking  avoiding too much weight gain  Doing pelvic floor exercises   2007, [employer] Doc.33 

## 2015-07-14 NOTE — Addendum Note (Signed)
Addended by: Linton Rump on: 07/14/2015 04:51 PM   Modules accepted: Orders
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
## 2015-07-15 LAB — HIV ANTIBODY (ROUTINE TESTING W REFLEX): HIV Screen 4th Generation wRfx: NONREACTIVE

## 2015-07-15 LAB — GC/CHLAMYDIA PROBE AMP
Chlamydia trachomatis, NAA: NEGATIVE
Neisseria gonorrhoeae by PCR: NEGATIVE

## 2015-07-15 LAB — HSV 2 ANTIBODY, IGG: HSV 2 Glycoprotein G Ab, IgG: 6.02 index — ABNORMAL HIGH (ref 0.00–0.90)

## 2015-07-15 LAB — RPR: RPR: NONREACTIVE

## 2015-07-21 ENCOUNTER — Telehealth: Payer: Self-pay | Admitting: Obstetrics and Gynecology

## 2015-07-21 NOTE — Telephone Encounter (Signed)
Pt aware of lab results and aware that she needed to make an appointment to discuss things further. Pt stated that she would call our office back next week and make an appointment.

## 2015-07-27 ENCOUNTER — Telehealth: Payer: Self-pay | Admitting: Obstetrics and Gynecology

## 2015-07-27 NOTE — Telephone Encounter (Signed)
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 All questions answered and pt verbalized understanding.

## 2015-08-11 ENCOUNTER — Encounter: Payer: Self-pay | Admitting: Obstetrics and Gynecology

## 2015-08-11 ENCOUNTER — Ambulatory Visit (INDEPENDENT_AMBULATORY_CARE_PROVIDER_SITE_OTHER): Payer: Medicaid Other | Admitting: Obstetrics and Gynecology

## 2015-08-11 VITALS — BP 112/72 | Ht 65.0 in | Wt 158.0 lb

## 2015-08-11 DIAGNOSIS — K5903 Drug induced constipation: Secondary | ICD-10-CM

## 2015-08-11 DIAGNOSIS — T402X5A Adverse effect of other opioids, initial encounter: Principal | ICD-10-CM | POA: Insufficient documentation
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
## 2015-08-11 NOTE — Progress Notes (Signed)
Patient ID: Meredith [name], female   DOB: Jan 12, 1966, 50 y.o.   MRN: [id number] Pt here today for follow up visit. Pt states that she is just supposed to talk to Dr. Glo Herring and discuss a surgery.

## 2015-08-12 ENCOUNTER — Ambulatory Visit: Payer: Self-pay | Admitting: Obstetrics and Gynecology

## 2015-10-26 ENCOUNTER — Ambulatory Visit: Payer: Self-pay | Admitting: Cardiology
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
## 2015-10-28 ENCOUNTER — Other Ambulatory Visit: Payer: Self-pay | Admitting: Gastroenterology

## 2015-11-11 ENCOUNTER — Encounter: Payer: Self-pay | Admitting: Obstetrics and Gynecology

## 2015-11-11 ENCOUNTER — Ambulatory Visit (INDEPENDENT_AMBULATORY_CARE_PROVIDER_SITE_OTHER): Payer: Medicaid Other | Admitting: Obstetrics and Gynecology

## 2015-11-11 DIAGNOSIS — N941 Unspecified dyspareunia: Secondary | ICD-10-CM | POA: Diagnosis not present

## 2015-11-11 DIAGNOSIS — K5900 Constipation, unspecified: Secondary | ICD-10-CM | POA: Diagnosis not present

## 2015-11-11 DIAGNOSIS — N816 Rectocele: Secondary | ICD-10-CM

## 2015-11-11 DIAGNOSIS — F529 Unspecified sexual dysfunction not due to a substance or known physiological condition: Secondary | ICD-10-CM
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
## 2015-11-19 ENCOUNTER — Ambulatory Visit: Payer: Self-pay | Admitting: Obstetrics and Gynecology

## 2015-11-20 ENCOUNTER — Ambulatory Visit (INDEPENDENT_AMBULATORY_CARE_PROVIDER_SITE_OTHER): Payer: Medicaid Other | Admitting: Obstetrics and Gynecology

## 2015-11-20 ENCOUNTER — Encounter: Payer: Self-pay | Admitting: Obstetrics and Gynecology

## 2015-11-20 VITALS — BP 134/78 | HR 89 | Ht 65.0 in | Wt 154.6 lb

## 2015-11-20 DIAGNOSIS — N816 Rectocele: Secondary | ICD-10-CM

## 2015-11-20 DIAGNOSIS — K5900 Constipation, unspecified: Secondary | ICD-10-CM | POA: Diagnosis not present

## 2015-11-20 DIAGNOSIS — F5231 Female orgasmic disorder: Secondary | ICD-10-CM | POA: Diagnosis not present

## 2015-11-20 DIAGNOSIS — N941 Unspecified dyspareunia: Secondary | ICD-10-CM | POA: Diagnosis not present
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
## 2015-11-24 DIAGNOSIS — F5231 Female orgasmic disorder: Secondary | ICD-10-CM | POA: Insufficient documentation

## 2015-11-24 DIAGNOSIS — N816 Rectocele: Secondary | ICD-10-CM | POA: Insufficient documentation

## 2015-11-24 DIAGNOSIS — K625 Hemorrhage of anus and rectum: Secondary | ICD-10-CM | POA: Insufficient documentation

## 2015-11-30 ENCOUNTER — Encounter: Payer: Self-pay | Admitting: Cardiology

## 2015-11-30 ENCOUNTER — Ambulatory Visit (INDEPENDENT_AMBULATORY_CARE_PROVIDER_SITE_OTHER): Payer: Medicaid Other | Admitting: Cardiology

## 2015-11-30 VITALS — BP 114/76 | HR 99 | Ht 65.0 in | Wt 151.0 lb

## 2015-11-30 DIAGNOSIS — R002 Palpitations: Secondary | ICD-10-CM | POA: Diagnosis not present

## 2015-11-30 DIAGNOSIS — Z79899 Other long term (current) drug therapy: Secondary | ICD-10-CM | POA: Diagnosis not present

## 2015-11-30 NOTE — Patient Instructions (Signed)
Your physician wants you to follow-up in: [place] DR. Domenic Polite You will receive a reminder letter in the mail two months in advance. If you don't receive a letter, please call our office to schedule the follow-up appointment.  Your physician recommends that you continue on your current medications as directed. Please refer to the Current Medication list given to you today.  Your physician has recommended that you wear an event monitor FOR 7 DAYS. Event monitors are medical devices that record the heart's electrical activity. Doctors most often [place] these monitors to diagnose arrhythmias. Arrhythmias are problems with the speed or rhythm of the heartbeat. The monitor is a small, portable device. You can wear one while you do your normal daily activities. This is usually used to diagnose what is causing palpitations/syncope (passing out).  Thank you for choosing [facility]!!
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
## 2015-12-02 ENCOUNTER — Telehealth: Payer: Self-pay | Admitting: Obstetrics and Gynecology

## 2015-12-02 NOTE — Telephone Encounter (Signed)
Pt informed per Dr. Glo Herring will not be able to perform surgery on 12/07/2015, will discuss surgery date at appt on 12/04/2015.   Pt verbalized understanding.

## 2015-12-04 ENCOUNTER — Encounter: Payer: Self-pay | Admitting: Obstetrics and Gynecology

## 2015-12-04 ENCOUNTER — Ambulatory Visit (INDEPENDENT_AMBULATORY_CARE_PROVIDER_SITE_OTHER): Payer: Medicaid Other | Admitting: Obstetrics and Gynecology

## 2015-12-04 DIAGNOSIS — K5901 Slow transit constipation: Secondary | ICD-10-CM
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
## 2015-12-14 ENCOUNTER — Ambulatory Visit (INDEPENDENT_AMBULATORY_CARE_PROVIDER_SITE_OTHER): Payer: Medicaid Other

## 2015-12-14 DIAGNOSIS — R002 Palpitations: Secondary | ICD-10-CM | POA: Diagnosis not present

## 2015-12-25 ENCOUNTER — Telehealth: Payer: Self-pay | Admitting: Cardiology

## 2015-12-25 NOTE — Telephone Encounter (Signed)
-----   Message from Massie Maroon, [place] sent at 12/22/2015  2:36 PM EDT -----   ----- Message ----- From: Satira Sark, MD Sent: 12/22/2015  12:52 PM To: Merlene Laughter, LPN  Results reviewed. Please let her know that the monitor was reassuring. She did not have any significant heart rhythm changes. A copy of this test should be forwarded to Minette Brine.

## 2015-12-25 NOTE — Telephone Encounter (Signed)
Returning a call

## 2015-12-25 NOTE — Telephone Encounter (Signed)
Pt aware, routed to pcp 

## 2016-01-19 ENCOUNTER — Other Ambulatory Visit: Payer: Self-pay | Admitting: Obstetrics and Gynecology

## 2016-01-19 DIAGNOSIS — Z1231 Encounter for screening mammogram for malignant neoplasm of breast: Secondary | ICD-10-CM

## 2016-02-09 ENCOUNTER — Ambulatory Visit (INDEPENDENT_AMBULATORY_CARE_PROVIDER_SITE_OTHER): Payer: Medicaid Other | Admitting: Internal Medicine

## 2016-02-09 ENCOUNTER — Encounter: Payer: Self-pay | Admitting: Internal Medicine

## 2016-02-09 ENCOUNTER — Ambulatory Visit: Payer: Self-pay | Admitting: Internal Medicine

## 2016-02-09 VITALS — BP 141/101 | HR 87 | Temp 97.7°F | Ht 65.0 in | Wt 155.0 lb

## 2016-02-09 DIAGNOSIS — K5903 Drug induced constipation: Secondary | ICD-10-CM | POA: Diagnosis not present

## 2016-02-09 DIAGNOSIS — K219 Gastro-esophageal reflux disease without esophagitis: Secondary | ICD-10-CM | POA: Diagnosis not present
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
## 2016-02-09 NOTE — Patient Instructions (Signed)
Continue Movantik daily  Continue Miralax at bedtime as needed  Begin Benefiber 1 tablespoon twice daily  Protonix 40 mg daily  Office visit with [place] in 3 months

## 2016-02-11 ENCOUNTER — Ambulatory Visit (HOSPITAL_COMMUNITY): Payer: Self-pay

## 2016-03-07 ENCOUNTER — Ambulatory Visit (HOSPITAL_COMMUNITY): Payer: Self-pay

## 2016-03-11 ENCOUNTER — Other Ambulatory Visit: Payer: Self-pay | Admitting: Gastroenterology

## 2016-04-01 ENCOUNTER — Ambulatory Visit: Payer: Self-pay | Admitting: Urology

## 2016-04-13 ENCOUNTER — Ambulatory Visit (INDEPENDENT_AMBULATORY_CARE_PROVIDER_SITE_OTHER): Payer: Medicaid Other | Admitting: Urology

## 2016-04-13 DIAGNOSIS — N201 Calculus of ureter: Secondary | ICD-10-CM | POA: Diagnosis not present

## 2016-04-27 ENCOUNTER — Ambulatory Visit (INDEPENDENT_AMBULATORY_CARE_PROVIDER_SITE_OTHER): Payer: Medicaid Other | Admitting: Urology

## 2016-04-27 ENCOUNTER — Other Ambulatory Visit: Payer: Self-pay | Admitting: Urology

## 2016-04-28 NOTE — Patient Instructions (Signed)
Meredith [name]  04/28/2016     @PREFPERIOPPHARMACY @   Your procedure is scheduled on  05/04/2016  Report to [facility] at  1130 A.M.  Call this number if you have problems the morning of surgery:  [phone]   Remember:  Do not eat food or drink liquids after midnight.  Take these medicines the morning of surgery with A SIP OF WATER  Xanax, adderall, wellbutrin, buspar, celebrex, thorazine, flexail, levothyroxine, movantik, protonix, lyrica, flomax, topamax, viibryd. Dilaudid or oxycodone (if needed). Take your inhaler before you come and bring your rescue inhaler with you if you have one.   Do not wear jewelry, make-up or nail polish.  Do not wear lotions, powders, or perfumes, or deoderant.  Do not shave 48 hours prior to surgery.  Men may shave face and neck.  Do not bring valuables to the hospital.  [facility] is not responsible for any belongings or valuables.  Contacts, dentures or bridgework may not be worn into surgery.  Leave your suitcase in the car.  After surgery it may be brought to your room.  For patients admitted to the hospital, discharge time will be determined by your treatment team.  Patients discharged the day of surgery will not be allowed to drive home.   Name and phone number of your driver:   family Special instructions:  none  Please read over the following fact sheets that you were given. Anesthesia Post-op Instructions and Care and Recovery After Surgery       Cystoscopy Cystoscopy is a procedure that is used to help diagnose and sometimes treat conditions that affect that lower urinary tract. The lower urinary tract includes the bladder and the tube that drains urine from the bladder out of the body (urethra). Cystoscopy is performed with a thin, tube-shaped instrument with a light and camera at the end (cystoscope). The cystoscope may be hard (rigid) or flexible, depending on the goal of the procedure.The cystoscope is inserted  through the urethra, into the bladder. Cystoscopy may be recommended if you have:  Urinary tractinfections that keep coming back (recurring).  Blood in the urine (hematuria).  Loss of bladder control (urinary incontinence) or an overactive bladder.  Unusual cells found in a urine sample.  A blockage in the urethra.  Painful urination.  An abnormality in the bladder found during an intravenous pyelogram (IVP) or CT scan. Cystoscopy may also be done to remove a sample of tissue to be examined under a microscope (biopsy). Tell a health care provider about:  Any allergies you have.  All medicines you are taking, including vitamins, herbs, eye drops, creams, and over-the-counter medicines.  Any problems you or family members have had with anesthetic medicines.  Any blood disorders you have.  Any surgeries you have had.  Any medical conditions you have.  Whether you are pregnant or may be pregnant. What are the risks? Generally, this is a safe procedure. However, problems may occur, including:  Infection.  Bleeding.  Allergic reactions to medicines.  Damage to other structures or organs. What happens before the procedure?  Ask your health care provider about:  Changing or stopping your regular medicines. This is especially important if you are taking diabetes medicines or blood thinners.  Taking medicines such as aspirin and ibuprofen. These medicines can thin your blood. Do not take these medicines before your procedure if your health care provider instructs you not to.  Follow instructions from your health  care provider about eating or drinking restrictions.  You may be given antibiotic medicine to help prevent infection.  You may have an exam or testing, such as X-rays of the bladder, urethra, or kidneys.  You may have urine tests to check for signs of infection.  Plan to have someone take you home after the procedure. What happens during the procedure?  To  reduce your risk of infection,your health care team will wash or sanitize their hands.  You will be given one or more of the following:  A medicine to help you relax (sedative).  A medicine to numb the area (local anesthetic).  The area around the opening of your urethra will be cleaned.  The cystoscope will be passed through your urethra into your bladder.  Germ-free (sterile)fluid will flow through the cystoscope to fill your bladder. The fluid will stretch your bladder so that your surgeon can clearly examine your bladder walls.  The cystoscope will be removed and your bladder will be emptied. The procedure may vary among health care providers and hospitals. What happens after the procedure?  You may have some soreness or pain in your abdomen and urethra. Medicines will be available to help you.  You may have some blood in your urine.  Do not drive for 24 hours if you received a sedative. This information is not intended to replace advice given to you by your health care provider. Make sure you discuss any questions you have with your health care provider. Document Released: 03/18/2000 Document Revised: 07/30/2015 Document Reviewed: 02/05/2015 Elsevier Interactive Patient Education  2017 [employer].  Cystoscopy, Care After Refer to this sheet in the next few weeks. These instructions provide you with information about caring for yourself after your procedure. Your health care provider may also give you more specific instructions. Your treatment has been planned according to current medical practices, but problems sometimes occur. Call your health care provider if you have any problems or questions after your procedure. What can I expect after the procedure? After the procedure, it is common to have:  Mild pain when you urinate. Pain should stop within a few minutes after you urinate. This may last for up to 1 week.  A small amount of blood in your urine for several  days.  Feeling like you need to urinate but producing only a small amount of urine. Follow these instructions at home:   Medicines  Take over-the-counter and prescription medicines only as told by your health care provider.  If you were prescribed an antibiotic medicine, take it as told by your health care provider. Do not stop taking the antibiotic even if you start to feel better. General instructions  Return to your normal activities as told by your health care provider. Ask your health care provider what activities are safe for you.  Do not drive for 24 hours if you received a sedative.  Watch for any blood in your urine. If the amount of blood in your urine increases, call your health care provider.  Follow instructions from your health care provider about eating or drinking restrictions.  If a tissue sample was removed for testing (biopsy) during your procedure, it is your responsibility to get your test results. Ask your health care provider or the department performing the test when your results will be ready.  Drink enough fluid to keep your urine clear or pale yellow.  Keep all follow-up visits as told by your health care provider. This is important. Contact a  health care provider if:  You have pain that gets worse or does not get better with medicine, especially pain when you urinate.  You have difficulty urinating. Get help right away if:  You have more blood in your urine.  You have blood clots in your urine.  You have abdominal pain.  You have a fever or chills.  You are unable to urinate. This information is not intended to replace advice given to you by your health care provider. Make sure you discuss any questions you have with your health care provider. Document Released: 10/08/2004 Document Revised: 08/27/2015 Document Reviewed: 02/05/2015 Elsevier Interactive Patient Education  2017 [employer] PATIENT INSTRUCTIONS POST-ANESTHESIA  IMMEDIATELY  FOLLOWING SURGERY:  Do not drive or operate machinery for the first twenty four hours after surgery.  Do not make any important decisions for twenty four hours after surgery or while taking narcotic pain medications or sedatives.  If you develop intractable nausea and vomiting or a severe headache please notify your doctor immediately.  FOLLOW-UP:  Please make an appointment with your surgeon as instructed. You do not need to follow up with anesthesia unless specifically instructed to do so.  WOUND CARE INSTRUCTIONS (if applicable):  Keep a dry clean dressing on the anesthesia/puncture wound site if there is drainage.  Once the wound has quit draining you may leave it open to air.  Generally you should leave the bandage intact for twenty four hours unless there is drainage.  If the epidural site drains for more than 36-48 hours please call the anesthesia department.  QUESTIONS?:  Please feel free to call your physician or the hospital operator if you have any questions, and they will be happy to assist you.

## 2016-04-29 ENCOUNTER — Ambulatory Visit (HOSPITAL_COMMUNITY)
Admission: RE | Admit: 2016-04-29 | Discharge: 2016-04-29 | Disposition: A | Discharge status: Home / Self Care | Payer: Medicaid Other | Source: Ambulatory Visit | Attending: Urology | Admitting: Urology

## 2016-04-29 ENCOUNTER — Other Ambulatory Visit: Payer: Self-pay

## 2016-04-29 ENCOUNTER — Encounter (HOSPITAL_COMMUNITY): Payer: Self-pay

## 2016-04-29 ENCOUNTER — Encounter (HOSPITAL_COMMUNITY)
Admission: RE | Admit: 2016-04-29 | Discharge: 2016-04-29 | Disposition: A | Discharge status: Home / Self Care | Payer: Medicaid Other | Source: Ambulatory Visit | Attending: Urology | Admitting: Urology

## 2016-04-29 DIAGNOSIS — D860 Sarcoidosis of lung: Secondary | ICD-10-CM

## 2016-04-29 LAB — BASIC METABOLIC PANEL
ANION GAP: 8 (ref 5–15)
BUN: 12 mg/dL (ref 6–20)
CALCIUM: 9 mg/dL (ref 8.9–10.3)
CO2: 24 mmol/L (ref 22–32)
Chloride: 105 mmol/L (ref 101–111)
Creatinine, Ser: 0.84 mg/dL (ref 0.44–1.00)
Glucose, Bld: 107 mg/dL — ABNORMAL HIGH (ref 65–99)
Potassium: 3.4 mmol/L — ABNORMAL LOW (ref 3.5–5.1)
Sodium: 137 mmol/L (ref 135–145)

## 2016-04-29 LAB — CBC
HCT: 46.8 % — ABNORMAL HIGH (ref 36.0–46.0)
Hemoglobin: 15.9 g/dL — ABNORMAL HIGH (ref 12.0–15.0)
MCH: 33.1 pg (ref 26.0–34.0)
MCHC: 34 g/dL (ref 30.0–36.0)
MCV: 97.5 fL (ref 78.0–100.0)
PLATELETS: 253 10*3/uL (ref 150–400)
RBC: 4.8 MIL/uL (ref 3.87–5.11)
RDW: 13.9 % (ref 11.5–15.5)
WBC: 6.5 10*3/uL (ref 4.0–10.5)

## 2016-05-04 ENCOUNTER — Encounter (HOSPITAL_COMMUNITY)
Admission: RE | Disposition: A | Discharge status: Home / Self Care | Payer: Self-pay | Source: Ambulatory Visit | Attending: Urology

## 2016-05-04 ENCOUNTER — Ambulatory Visit (HOSPITAL_COMMUNITY): Payer: Medicaid Other | Admitting: Anesthesiology

## 2016-05-04 ENCOUNTER — Encounter (HOSPITAL_COMMUNITY): Payer: Self-pay

## 2016-05-04 ENCOUNTER — Ambulatory Visit (HOSPITAL_COMMUNITY)
Admission: RE | Admit: 2016-05-04 | Discharge: 2016-05-04 | Disposition: A | Discharge status: Home / Self Care | Payer: Medicaid Other | Source: Ambulatory Visit | Attending: Urology | Admitting: Urology

## 2016-05-04 ENCOUNTER — Ambulatory Visit (HOSPITAL_COMMUNITY): Payer: Medicaid Other

## 2016-05-04 DIAGNOSIS — Z915 Personal history of self-harm: Secondary | ICD-10-CM | POA: Insufficient documentation

## 2016-05-04 DIAGNOSIS — Z87442 Personal history of urinary calculi: Secondary | ICD-10-CM | POA: Diagnosis not present

## 2016-05-04 DIAGNOSIS — N201 Calculus of ureter: Secondary | ICD-10-CM | POA: Insufficient documentation

## 2016-05-04 DIAGNOSIS — F17210 Nicotine dependence, cigarettes, uncomplicated: Secondary | ICD-10-CM | POA: Insufficient documentation

## 2016-05-04 DIAGNOSIS — Z79891 Long term (current) use of opiate analgesic: Secondary | ICD-10-CM | POA: Insufficient documentation

## 2016-05-04 DIAGNOSIS — M5137 Other intervertebral disc degeneration, lumbosacral region: Secondary | ICD-10-CM | POA: Diagnosis not present

## 2016-05-04 DIAGNOSIS — F319 Bipolar disorder, unspecified: Secondary | ICD-10-CM | POA: Diagnosis not present

## 2016-05-04 DIAGNOSIS — G8929 Other chronic pain: Secondary | ICD-10-CM | POA: Diagnosis not present

## 2016-05-04 DIAGNOSIS — F419 Anxiety disorder, unspecified: Secondary | ICD-10-CM | POA: Diagnosis not present

## 2016-05-04 DIAGNOSIS — Z9889 Other specified postprocedural states: Secondary | ICD-10-CM | POA: Insufficient documentation

## 2016-05-04 DIAGNOSIS — F209 Schizophrenia, unspecified: Secondary | ICD-10-CM | POA: Insufficient documentation

## 2016-05-04 DIAGNOSIS — K219 Gastro-esophageal reflux disease without esophagitis: Secondary | ICD-10-CM | POA: Diagnosis not present

## 2016-05-04 DIAGNOSIS — M797 Fibromyalgia: Secondary | ICD-10-CM | POA: Insufficient documentation

## 2016-05-04 DIAGNOSIS — Z8249 Family history of ischemic heart disease and other diseases of the circulatory system: Secondary | ICD-10-CM | POA: Insufficient documentation

## 2016-05-04 DIAGNOSIS — Z8261 Family history of arthritis: Secondary | ICD-10-CM | POA: Insufficient documentation

## 2016-05-04 DIAGNOSIS — Z801 Family history of malignant neoplasm of trachea, bronchus and lung: Secondary | ICD-10-CM | POA: Diagnosis not present

## 2016-05-04 DIAGNOSIS — N200 Calculus of kidney: Secondary | ICD-10-CM

## 2016-05-04 DIAGNOSIS — Z79899 Other long term (current) drug therapy: Secondary | ICD-10-CM | POA: Diagnosis not present

## 2016-05-04 DIAGNOSIS — Z823 Family history of stroke: Secondary | ICD-10-CM | POA: Insufficient documentation

## 2016-05-04 DIAGNOSIS — Z90710 Acquired absence of both cervix and uterus: Secondary | ICD-10-CM | POA: Insufficient documentation

## 2016-05-04 DIAGNOSIS — Z836 Family history of other diseases of the respiratory system: Secondary | ICD-10-CM | POA: Insufficient documentation

## 2016-05-04 HISTORY — PX: CYSTOSCOPY WITH RETROGRADE PYELOGRAM, URETEROSCOPY AND STENT PLACEMENT: SHX5789

## 2016-05-04 HISTORY — PX: CYSTOSCOPY WITH HOLMIUM LASER LITHOTRIPSY: SHX6639

## 2016-05-04 HISTORY — PX: URETEROSCOPY: SHX842

## 2016-05-04 LAB — RAPID URINE DRUG SCREEN, HOSP PERFORMED
Amphetamines: POSITIVE — AB
Barbiturates: NOT DETECTED
Benzodiazepines: POSITIVE — AB
COCAINE: NOT DETECTED
OPIATES: POSITIVE — AB
Tetrahydrocannabinol: NOT DETECTED

## 2016-05-04 SURGERY — CYSTOURETEROSCOPY, WITH RETROGRADE PYELOGRAM AND STENT INSERTION
Anesthesia: General | Site: Ureter | Laterality: Right

## 2016-05-04 MED ORDER — FENTANYL CITRATE (PF) 100 MCG/2ML IJ SOLN: 25.0000 ug | INTRAMUSCULAR | Status: DC | PRN

## 2016-05-04 MED ORDER — STERILE WATER FOR IRRIGATION IR SOLN
Status: DC | PRN
  Administered 2016-05-04: 1000 mL

## 2016-05-04 MED ORDER — SULFAMETHOXAZOLE-TRIMETHOPRIM 800-160 MG PO TABS: 1.0000 | ORAL_TABLET | Freq: Two times a day (BID) | ORAL | 0 refills | Status: DC

## 2016-05-04 MED ORDER — CEFAZOLIN IN D5W 1 GM/50ML IV SOLN
1.0000 g | INTRAVENOUS | Status: AC
  Administered 2016-05-04: 1 g via INTRAVENOUS

## 2016-05-04 MED ORDER — MIDAZOLAM HCL 2 MG/2ML IJ SOLN
0.5000 mg | INTRAMUSCULAR | Status: DC | PRN
  Administered 2016-05-04: 2 mg via INTRAVENOUS
  Filled 2016-05-04: qty 2

## 2016-05-04 MED ORDER — PROPOFOL 10 MG/ML IV BOLUS
INTRAVENOUS | Status: DC | PRN
  Administered 2016-05-04: 150 mg via INTRAVENOUS

## 2016-05-04 MED ORDER — PROPOFOL 10 MG/ML IV BOLUS
INTRAVENOUS | Status: AC
  Filled 2016-05-04: qty 20

## 2016-05-04 MED ORDER — ONDANSETRON HCL 4 MG/2ML IJ SOLN
4.0000 mg | Freq: Once | INTRAMUSCULAR | Status: AC
  Administered 2016-05-04: 4 mg via INTRAVENOUS
  Filled 2016-05-04: qty 2

## 2016-05-04 MED ORDER — LACTATED RINGERS IV SOLN
INTRAVENOUS | Status: DC
  Administered 2016-05-04: 12:00:00 via INTRAVENOUS

## 2016-05-04 MED ORDER — SODIUM CHLORIDE 0.9 % IR SOLN
Status: DC | PRN
Start: 2016-05-04 — End: 2016-05-04
  Administered 2016-05-04: 3000 mL

## 2016-05-04 MED ORDER — CEFAZOLIN IN D5W 1 GM/50ML IV SOLN
INTRAVENOUS | Status: AC
  Filled 2016-05-04: qty 50

## 2016-05-04 MED ORDER — LIDOCAINE HCL (CARDIAC) 20 MG/ML IV SOLN
INTRAVENOUS | Status: DC | PRN
  Administered 2016-05-04: 25 mg via INTRAVENOUS

## 2016-05-04 MED ORDER — LIDOCAINE HCL (PF) 1 % IJ SOLN
INTRAMUSCULAR | Status: AC
  Filled 2016-05-04: qty 5

## 2016-05-04 MED ORDER — DIATRIZOATE MEGLUMINE 30 % UR SOLN
URETHRAL | Status: DC | PRN
  Administered 2016-05-04: 30 mL via URETHRAL

## 2016-05-04 MED ORDER — SUCCINYLCHOLINE CHLORIDE 20 MG/ML IJ SOLN
INTRAMUSCULAR | Status: DC | PRN
  Administered 2016-05-04: 120 mg via INTRAVENOUS

## 2016-05-04 MED ORDER — FENTANYL CITRATE (PF) 100 MCG/2ML IJ SOLN
INTRAMUSCULAR | Status: AC
  Filled 2016-05-04: qty 2

## 2016-05-04 MED ORDER — DIATRIZOATE MEGLUMINE 30 % UR SOLN
URETHRAL | Status: AC
  Filled 2016-05-04: qty 300

## 2016-05-04 MED ORDER — ARTIFICIAL TEARS OP OINT
TOPICAL_OINTMENT | OPHTHALMIC | Status: AC
  Filled 2016-05-04: qty 3.5

## 2016-05-04 MED ORDER — FENTANYL CITRATE (PF) 100 MCG/2ML IJ SOLN
INTRAMUSCULAR | Status: DC | PRN
  Administered 2016-05-04: 50 ug via INTRAVENOUS
  Administered 2016-05-04: 100 ug via INTRAVENOUS
  Administered 2016-05-04: 50 ug via INTRAVENOUS

## 2016-05-04 MED ORDER — HYDROMORPHONE HCL 4 MG PO TABS: 4.0000 mg | ORAL_TABLET | ORAL | 0 refills | Status: DC | PRN

## 2016-05-04 SURGICAL SUPPLY — 27 items
BAG HAMPER (MISCELLANEOUS) ×4 IMPLANT
BAG URO CATCHER STRL LF (MISCELLANEOUS) ×4 IMPLANT
BASKET LASER NITINOL 1.9FR (BASKET) IMPLANT
BASKET ZERO TIP NITINOL 2.4FR (BASKET) IMPLANT
BSKT STON RTRVL 120 1.9FR (BASKET)
BSKT STON RTRVL ZERO TP 2.4FR (BASKET)
CATH INTERMIT  6FR 70CM (CATHETERS) ×2 IMPLANT
CLOTH BEACON ORANGE TIMEOUT ST (SAFETY) ×4 IMPLANT
EXTRACTOR STONE NITINOL NGAGE (UROLOGICAL SUPPLIES) ×4 IMPLANT
FIBER LASER FLEXIVA 200 (UROLOGICAL SUPPLIES) ×2 IMPLANT
FIBER LASER FLEXIVA 365 (UROLOGICAL SUPPLIES) IMPLANT
GLOVE BIO SURGEON STRL SZ8 (GLOVE) ×4 IMPLANT
GOWN STRL REUS W/ TWL LRG LVL3 (GOWN DISPOSABLE) ×2 IMPLANT
GOWN STRL REUS W/ TWL XL LVL3 (GOWN DISPOSABLE) ×2 IMPLANT
GOWN STRL REUS W/TWL LRG LVL3 (GOWN DISPOSABLE) ×4
GOWN STRL REUS W/TWL XL LVL3 (GOWN DISPOSABLE) ×4
GUIDEWIRE ANG ZIPWIRE 038X150 (WIRE) ×4 IMPLANT
GUIDEWIRE STR DUAL SENSOR (WIRE) IMPLANT
GUIDEWIRE STR ZIPWIRE 035X150 (MISCELLANEOUS) ×4 IMPLANT
IV NS IRRIG 3000ML ARTHROMATIC (IV SOLUTION) ×4 IMPLANT
MANIFOLD NEPTUNE II (INSTRUMENTS) IMPLANT
PACK CYSTO (CUSTOM PROCEDURE TRAY) ×4 IMPLANT
PAD ARMBOARD 7.5X6 YLW CONV (MISCELLANEOUS) ×4 IMPLANT
STENT URET 6FRX26 CONTOUR (STENTS) ×2 IMPLANT
SYRINGE 10CC LL (SYRINGE) ×4 IMPLANT
TOWEL OR 17X26 4PK STRL BLUE (TOWEL DISPOSABLE) ×4 IMPLANT
TUBE FEEDING 8FR 16IN STR KANG (MISCELLANEOUS) IMPLANT

## 2016-05-04 NOTE — Op Note (Signed)
Preoperative diagnosis: Right ureteral stone  Postoperative diagnosis: Same  Procedure: 1 cystoscopy 2 right retrograde pyelography 3.  Intraoperative fluoroscopy, under one hour, with interpretation 4.  Right ureteroscopic stone manipulation with laser lithotripsy 5.  Right 6 x 26 JJ stent placement  Attending: Rosie Fate  Anesthesia: General  Estimated blood loss: None  Drains: Right 6 x 26 JJ ureteral stent with tether  Specimens: stone for analysis  Antibiotics: ancef  Findings: right mid ureteral calculus.  Indications: Patient is a 51 year old female/female with a history of ureteral stone and who has failed medical expulsive therapy.  After discussing treatment options, she decided proceed with right ureteroscopic stone manipulation.  Procedure her in detail: The patient was brought to the operating room and a brief timeout was done to ensure correct patient, correct procedure, correct site.  General anesthesia was administered patient was placed in dorsal lithotomy position.  Her genitalia was then prepped and draped in usual sterile fashion.  A rigid 74 French cystoscope was passed in the urethra and the bladder.  Bladder was inspected free masses or lesions.  the right ureteral orifices were in the normal orthotopic locations.  a 6 french ureteral catheter was then instilled into the right ureter orifice.  a gentle retrograde was obtained and findings noted above.  we then placed a zip wire through the ureteral catheter and advanced up to the renal pelvis.  we then removed the cystoscope and cannulated the right ureteral orifice with a semirigid ureteroscope.  we then encountered the stone in the mid ureter. using a 200 nm laser fiber and fragmented the stone into smaller pieces.  the pieces were then removed with a engage basket.  We then placed and good coil was noted in the the renal pelvis under fluoroscopy and the bladder under direct vision.   once all stone fragments  were removed we then placed a 6 x 26 double-j ureteral stent over the original zip wire.  the stone fragments were then removed from the bladder and sent for analysis.   the bladder was then drained and this concluded the procedure which was well tolerated by patient.  Complications: None  Condition: Stable, extubated, transferred to PACU  Plan: Patient is to be discharged home as to follow-up in one week. She is to remove her stent by pulling the tether in 72 hours

## 2016-05-04 NOTE — Anesthesia Postprocedure Evaluation (Deleted)
Anesthesia Post Note  Patient: Meredith [name]  Procedure(s) Performed: Procedure(s) (LRB): CYSTOSCOPY WITH RIGHT RETROGRADE PYELOGRAM  AND RIGHT URETERAL STENT PLACEMENT (Right) RIGHT STONE EXTRACTION WITH LASER (Right)  Patient location during evaluation: PACU Anesthesia Type: General Level of consciousness: patient cooperative, awake and alert and oriented Pain management: pain level controlled Vital Signs Assessment: post-procedure vital signs reviewed and stable Respiratory status: spontaneous breathing Cardiovascular status: stable Postop Assessment: no signs of nausea or vomiting Anesthetic complications: no     Last Vitals:  Vitals:   05/04/16 1215 05/04/16 1220  BP: 126/78 129/76  Pulse:    Resp: (!) 21 20    Last Pain:  Vitals:   05/04/16 1207  TempSrc: Oral  PainSc: 5                  ADAMS, AMY A

## 2016-05-04 NOTE — Anesthesia Procedure Notes (Signed)
Procedure Name: Intubation Date/Time: 05/04/2016 1:07 PM Performed by: Andree Elk, Rachelann Enloe A Pre-anesthesia Checklist: Timeout performed, Patient identified, Emergency Drugs available, Suction available and Patient being monitored Patient Re-evaluated:Patient Re-evaluated prior to inductionOxygen Delivery Method: Circle System Utilized Preoxygenation: Pre-oxygenation with 100% oxygen Intubation Type: IV induction, Cricoid Pressure applied and Rapid sequence Laryngoscope Size: Miller and 3 Grade View: Grade I Tube type: Oral Tube size: 7.0 mm Number of attempts: 1 Airway Equipment and Method: Stylet Placement Confirmation: ETT inserted through vocal cords under direct vision,  positive ETCO2 and breath sounds checked- equal and bilateral Secured at: 21 cm Tube secured with: Tape Dental Injury: Teeth and Oropharynx as per pre-operative assessment

## 2016-05-04 NOTE — Anesthesia Postprocedure Evaluation (Signed)
Anesthesia Post Note  Patient: Meredith [name]  Procedure(s) Performed: Procedure(s) (LRB): CYSTOSCOPY WITH RIGHT RETROGRADE PYELOGRAM  AND RIGHT URETERAL STENT PLACEMENT (Right) RIGHT STONE EXTRACTION WITH LASER (Right) URETEROSCOPY (Right)  Patient location during evaluation: PACU Anesthesia Type: General Level of consciousness: awake and alert and oriented Pain management: pain level controlled Vital Signs Assessment: post-procedure vital signs reviewed and stable Cardiovascular status: stable Postop Assessment: no signs of nausea or vomiting Anesthetic complications: no     Last Vitals:  Vitals:   05/04/16 1220 05/04/16 1351  BP: 129/76 (!) 139/107  Pulse:    Resp: 20 14  Temp:  36.8 C    Last Pain:  Vitals:   05/04/16 1207  TempSrc: Oral  PainSc: 5                  ADAMS, AMY A

## 2016-05-04 NOTE — Anesthesia Preprocedure Evaluation (Signed)
Anesthesia Evaluation  Patient identified by MRN, date of birth, ID band Patient awake    Reviewed: Allergy & Precautions, NPO status , Patient's Chart, lab work & pertinent test results  Airway Mallampati: II  TM Distance: >3 FB     Dental  (+) Partial Upper   Pulmonary Current Smoker,    breath sounds clear to auscultation       Cardiovascular negative cardio ROS   Rhythm:Regular Rate:Normal     Neuro/Psych PSYCHIATRIC DISORDERS Bipolar Disorder Schizophrenia    GI/Hepatic GERD  ,(+)     substance abuse (denies recent use)  cocaine use and methamphetamine use,   Endo/Other    Renal/GU      Musculoskeletal  (+) Arthritis , Fibromyalgia -  Abdominal   Peds  Hematology   Anesthesia Other Findings   Reproductive/Obstetrics                             Anesthesia Physical Anesthesia Plan  ASA: III  Anesthesia Plan: General   Post-op Pain Management:    Induction: Intravenous  Airway Management Planned: LMA  Additional Equipment:   Intra-op Plan:   Post-operative Plan: Extubation in OR  Informed Consent: I have reviewed the patients History and Physical, chart, labs and discussed the procedure including the risks, benefits and alternatives for the proposed anesthesia with the patient or authorized representative who has indicated his/her understanding and acceptance.     Plan Discussed with:   Anesthesia Plan Comments:         Anesthesia Quick Evaluation

## 2016-05-04 NOTE — Transfer of Care (Signed)
Immediate Anesthesia Transfer of Care Note  Patient: Meredith [name]  Procedure(s) Performed: Procedure(s): CYSTOSCOPY WITH RIGHT RETROGRADE PYELOGRAM  AND RIGHT URETERAL STENT PLACEMENT (Right) RIGHT STONE EXTRACTION WITH LASER (Right)  Patient Location: PACU  Anesthesia Type:General  Level of Consciousness: awake, alert , oriented and patient cooperative  Airway & Oxygen Therapy: Patient Spontanous Breathing and Patient connected to face mask oxygen  Post-op Assessment: Report given to RN and Post -op Vital signs reviewed and stable  Post vital signs: Reviewed and stable  Last Vitals:  Vitals:   05/04/16 1215 05/04/16 1220  BP: 126/78 129/76  Pulse:    Resp: (!) 21 20    Last Pain:  Vitals:   05/04/16 1207  TempSrc: Oral  PainSc: 5       Patients Stated Pain Goal: 7 ([phone] [id number])  Complications: No apparent anesthesia complications

## 2016-05-04 NOTE — Discharge Instructions (Signed)
Cystoscopy, Care After Refer to this sheet in the next few weeks. These instructions provide you with information about caring for yourself after your procedure. Your health care provider may also give you more specific instructions. Your treatment has been planned according to current medical practices, but problems sometimes occur. Call your health care provider if you have any problems or questions after your procedure. What can I expect after the procedure? After the procedure, it is common to have:  Mild pain when you urinate. Pain should stop within a few minutes after you urinate. This may last for up to 1 week.  A small amount of blood in your urine for several days.  Feeling like you need to urinate but producing only a small amount of urine. Follow these instructions at home:   Medicines  Take over-the-counter and prescription medicines only as told by your health care provider.  If you were prescribed an antibiotic medicine, take it as told by your health care provider. Do not stop taking the antibiotic even if you start to feel better. General instructions  Return to your normal activities as told by your health care provider. Ask your health care provider what activities are safe for you.  Do not drive for 24 hours if you received a sedative.  Watch for any blood in your urine. If the amount of blood in your urine increases, call your health care provider.  Follow instructions from your health care provider about eating or drinking restrictions.  If a tissue sample was removed for testing (biopsy) during your procedure, it is your responsibility to get your test results. Ask your health care provider or the department performing the test when your results will be ready.  Drink enough fluid to keep your urine clear or pale yellow.  Keep all follow-up visits as told by your health care provider. This is important. Contact a health care provider if:  You have pain that  gets worse or does not get better with medicine, especially pain when you urinate.  You have difficulty urinating. Get help right away if:  You have more blood in your urine.  You have blood clots in your urine.  You have abdominal pain.  You have a fever or chills.  You are unable to urinate. This information is not intended to replace advice given to you by your health care provider. Make sure you discuss any questions you have with your health care provider. Document Released: 10/08/2004 Document Revised: 08/27/2015 Document Reviewed: 02/05/2015 Elsevier Interactive Patient Education  2017 [employer]    Ureteral Stent Implantation, Care After Introduction Refer to this sheet in the next few weeks. These instructions provide you with information about caring for yourself after your procedure. Your health care provider may also give you more specific instructions. Your treatment has been planned according to current medical practices, but problems sometimes occur. Call your health care provider if you have any problems or questions after your procedure. What can I expect after the procedure? After the procedure, it is common to have:  Nausea.  Mild pain when you urinate. You may feel this pain in your lower back or lower abdomen. Pain should stop within a few minutes after you urinate. This may last for up to 1 week.  A small amount of blood in your urine for several days. Follow these instructions at home:   Medicines  Take over-the-counter and prescription medicines only as told by your health care provider.  If you were  prescribed an antibiotic medicine, take it as told by your health care provider. Do not stop taking the antibiotic even if you start to feel better.  Do not drive for 24 hours if you received a sedative.  Do not drive or operate heavy machinery while taking prescription pain medicines. Activity  Return to your normal activities as told by your health  care provider. Ask your health care provider what activities are safe for you.  Do not lift anything that is heavier than 10 lb (4.5 kg). Follow this limit for 1 week after your procedure, or for as long as told by your health care provider. General instructions  Watch for any blood in your urine. Call your health care provider if the amount of blood in your urine increases.  If you have a catheter:  Follow instructions from your health care provider about taking care of your catheter and collection bag.  Do not take baths, swim, or use a hot tub until your health care provider approves.  Drink enough fluid to keep your urine clear or pale yellow.  Keep all follow-up visits as told by your health care provider. This is important. Contact a health care provider if:  You have pain that gets worse or does not get better with medicine, especially pain when you urinate.  You have difficulty urinating.  You feel nauseous or you vomit repeatedly during a period of more than 2 days after the procedure. Get help right away if:  Your urine is dark red or has blood clots in it.  You are leaking urine (have incontinence).  The end of the stent comes out of your urethra.  You cannot urinate.  You have sudden, sharp, or severe pain in your abdomen or lower back.  You have a fever. This information is not intended to replace advice given to you by your health care provider. Make sure you discuss any questions you have with your health care provider. Document Released: 11/21/2012 Document Revised: 08/27/2015 Document Reviewed: 10/03/2014  2017 [employer]
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
## 2016-05-05 ENCOUNTER — Encounter (HOSPITAL_COMMUNITY): Payer: Self-pay | Admitting: Urology

## 2016-05-10 ENCOUNTER — Encounter (HOSPITAL_COMMUNITY): Payer: Self-pay | Admitting: *Deleted

## 2016-05-10 ENCOUNTER — Emergency Department (HOSPITAL_COMMUNITY)
Admission: EM | Admit: 2016-05-10 | Discharge: 2016-05-10 | Disposition: A | Discharge status: Home / Self Care | Payer: Medicaid Other | Attending: Emergency Medicine | Admitting: Emergency Medicine

## 2016-05-10 DIAGNOSIS — Z79899 Other long term (current) drug therapy: Secondary | ICD-10-CM | POA: Insufficient documentation

## 2016-05-10 DIAGNOSIS — F17210 Nicotine dependence, cigarettes, uncomplicated: Secondary | ICD-10-CM | POA: Diagnosis not present

## 2016-05-10 DIAGNOSIS — R300 Dysuria: Secondary | ICD-10-CM

## 2016-05-10 LAB — I-STAT CHEM 8, ED
BUN: 10 mg/dL (ref 6–20)
CHLORIDE: 107 mmol/L (ref 101–111)
CREATININE: 0.9 mg/dL (ref 0.44–1.00)
Calcium, Ion: 1.17 mmol/L (ref 1.15–1.40)
Glucose, Bld: 114 mg/dL — ABNORMAL HIGH (ref 65–99)
HEMATOCRIT: 42 % (ref 36.0–46.0)
Hemoglobin: 14.3 g/dL (ref 12.0–15.0)
Potassium: 3.8 mmol/L (ref 3.5–5.1)
SODIUM: 141 mmol/L (ref 135–145)
TCO2: 22 mmol/L (ref 0–100)

## 2016-05-10 LAB — URINALYSIS, ROUTINE W REFLEX MICROSCOPIC
BILIRUBIN URINE: NEGATIVE
Glucose, UA: NEGATIVE mg/dL
Ketones, ur: NEGATIVE mg/dL
NITRITE: NEGATIVE
Protein, ur: NEGATIVE mg/dL
pH: 6 (ref 5.0–8.0)

## 2016-05-10 LAB — URINALYSIS, MICROSCOPIC (REFLEX)

## 2016-05-10 MED ORDER — PHENAZOPYRIDINE HCL 100 MG PO TABS: 100.0000 mg | ORAL_TABLET | Freq: Three times a day (TID) | ORAL | 0 refills | Status: DC | PRN

## 2016-05-10 MED ORDER — PHENAZOPYRIDINE HCL 100 MG PO TABS
100.0000 mg | ORAL_TABLET | Freq: Once | ORAL | Status: AC
  Administered 2016-05-10: 100 mg via ORAL
  Filled 2016-05-10: qty 1

## 2016-05-10 MED ORDER — LIDOCAINE HCL 2 % EX GEL: 1.0000 "application " | CUTANEOUS | 0 refills | Status: DC | PRN

## 2016-05-10 MED ORDER — OXYCODONE-ACETAMINOPHEN 5-325 MG PO TABS
2.0000 | ORAL_TABLET | Freq: Once | ORAL | Status: AC
Start: 2016-05-10 — End: 2016-05-10
  Administered 2016-05-10: 2 via ORAL
  Filled 2016-05-10: qty 2

## 2016-05-10 MED ORDER — LIDOCAINE HCL 2 % EX GEL
1.0000 "application " | Freq: Once | CUTANEOUS | Status: AC
  Administered 2016-05-10: 1 via URETHRAL
  Filled 2016-05-10: qty 10
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
## 2016-05-10 NOTE — ED Triage Notes (Signed)
Pt c/o burning with urination; pt states she had a stent placed last week and removed it Saturday and since she removed it she has been having pain with urination

## 2016-05-10 NOTE — Discharge Instructions (Signed)
You were seen today with burning with urination. Your workup is reassuring. You will be prescribed Pyridium and lidocaine jelly. Apply the lidocaine up to 3 times daily as needed for burning with urination. You do not have any evidence of infection at this time. Follow-up with your primary physician and pain management for ongoing pain management needs.

## 2016-05-11 ENCOUNTER — Ambulatory Visit: Payer: Medicaid Other | Admitting: Gastroenterology

## 2016-05-11 LAB — URINE CULTURE: CULTURE: NO GROWTH

## 2016-05-13 ENCOUNTER — Other Ambulatory Visit: Payer: Self-pay

## 2016-05-13 ENCOUNTER — Encounter: Payer: Self-pay | Admitting: Gastroenterology

## 2016-05-13 ENCOUNTER — Ambulatory Visit (INDEPENDENT_AMBULATORY_CARE_PROVIDER_SITE_OTHER): Payer: Medicaid Other | Admitting: Gastroenterology

## 2016-05-13 VITALS — BP 105/70 | HR 87 | Temp 97.8°F | Ht 65.0 in | Wt 159.2 lb

## 2016-05-13 DIAGNOSIS — Z1211 Encounter for screening for malignant neoplasm of colon: Secondary | ICD-10-CM

## 2016-05-13 DIAGNOSIS — K5903 Drug induced constipation: Secondary | ICD-10-CM

## 2016-05-13 DIAGNOSIS — T402X5A Adverse effect of other opioids, initial encounter: Secondary | ICD-10-CM

## 2016-05-13 DIAGNOSIS — R1310 Dysphagia, unspecified: Secondary | ICD-10-CM

## 2016-05-13 LAB — STONE ANALYSIS
Ca Oxalate,Dihydrate: 10 %
Ca Oxalate,Monohydr.: 80 %
Ca phos cry stone ql IR: 10 %
STONE WEIGHT KSTONE: 5.8 mg

## 2016-05-13 MED ORDER — SOD PICOSULFATE-MAG OX-CIT ACD 10-3.5-12 MG-GM-GM PO PACK: 1.0000 | PACK | ORAL | 0 refills | Status: DC

## 2016-05-13 NOTE — Progress Notes (Signed)
CC'ED TO PCP 

## 2016-05-13 NOTE — Assessment & Plan Note (Signed)
Vague reports of oropharyngeal dysphagia and esophageal dysphagia in setting of known motility disorder. However, she has noted improvement with empiric dilation historically (last in 2013). BPE on file from last year with esophageal dysmotility, no stricture. Will hold off on repeating this as she states symptoms are worsening. After EGD, will likely refer back to speech .  Proceed with upper endoscopy/dilation in the near future with Dr. Gala Romney. The risks, benefits, and alternatives have been discussed in detail with patient. They have stated understanding and desire to proceed.  PROPOFOl due to polypharmacy Continue Protonix once daily
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
## 2016-05-13 NOTE — Assessment & Plan Note (Addendum)
Doing well on Movantik 25 mg daily. No alarm features. Due for routine initial screening colonoscopy now. No family history of colon cancer.   Proceed with TCS with Dr. Gala Romney in near future: the risks, benefits, and alternatives have been discussed with the patient in detail. The patient states understanding and desires to proceed. PROPOFOL due to polypharmacy  Return in 6 months

## 2016-05-13 NOTE — Patient Instructions (Signed)
We have scheduled you for a screening colonoscopy and an upper endoscopy with dilation with Dr. Gala Romney.   We will see you back in 6 months!

## 2016-05-16 ENCOUNTER — Telehealth: Payer: Self-pay

## 2016-05-16 NOTE — Telephone Encounter (Signed)
Called pt to inform her I am mailing instructions for TCS/EGD/DIL that is scheduled for 06/13/16. She has already picked up her prep at pharmacy.

## 2016-05-18 ENCOUNTER — Ambulatory Visit (INDEPENDENT_AMBULATORY_CARE_PROVIDER_SITE_OTHER): Payer: Medicaid Other | Admitting: Urology

## 2016-05-18 DIAGNOSIS — N201 Calculus of ureter: Secondary | ICD-10-CM | POA: Diagnosis not present

## 2016-05-23 ENCOUNTER — Ambulatory Visit (HOSPITAL_COMMUNITY)
Admission: RE | Admit: 2016-05-23 | Discharge: 2016-05-23 | Disposition: A | Discharge status: Home / Self Care | Payer: Medicaid Other | Source: Ambulatory Visit | Attending: Obstetrics and Gynecology | Admitting: Obstetrics and Gynecology

## 2016-05-23 DIAGNOSIS — R921 Mammographic calcification found on diagnostic imaging of breast: Secondary | ICD-10-CM | POA: Diagnosis not present

## 2016-05-23 DIAGNOSIS — Z1231 Encounter for screening mammogram for malignant neoplasm of breast: Secondary | ICD-10-CM | POA: Diagnosis present

## 2016-05-25 ENCOUNTER — Other Ambulatory Visit: Payer: Self-pay | Admitting: Obstetrics and Gynecology

## 2016-05-25 DIAGNOSIS — R928 Other abnormal and inconclusive findings on diagnostic imaging of breast: Secondary | ICD-10-CM

## 2016-05-31 ENCOUNTER — Other Ambulatory Visit: Payer: Self-pay | Admitting: Urology

## 2016-05-31 DIAGNOSIS — N201 Calculus of ureter: Secondary | ICD-10-CM

## 2016-06-03 ENCOUNTER — Other Ambulatory Visit: Payer: Self-pay | Admitting: Internal Medicine

## 2016-06-03 NOTE — Patient Instructions (Signed)
Meredith [name]  06/03/2016     @PREFPERIOPPHARMACY @   Your procedure is scheduled on  06/13/2016   Report to [facility] at  28  A.M.  Call this number if you have problems the morning of surgery:  [phone]   Remember:  Do not eat food or drink liquids after midnight.  Take these medicines the morning of surgery with A SIP OF WATER  Xanax, adderall, wellbutrin, buspar, celexa, thorazine, flexaril, movantik, oxycodone, protonix, pyridium, lyrica, topamx. Take you nebulizer and your inhaler before you come and bring your rescue ihaler with you if you have one.   Do not wear jewelry, make-up or nail polish.  Do not wear lotions, powders, or perfumes, or deoderant.  Do not shave 48 hours prior to surgery.  Men may shave face and neck.  Do not bring valuables to the hospital.  [facility] is not responsible for any belongings or valuables.  Contacts, dentures or bridgework may not be worn into surgery.  Leave your suitcase in the car.  After surgery it may be brought to your room.  For patients admitted to the hospital, discharge time will be determined by your treatment team.  Patients discharged the day of surgery will not be allowed to drive home.   Name and phone number of your driver:   family Special instructions:  Follow the diet and prep instructions given to you by Dr Roseanne Kaufman office.  Please read over the following fact sheets that you were given. Anesthesia Post-op Instructions and Care and Recovery After Surgery       Esophagogastroduodenoscopy Esophagogastroduodenoscopy (EGD) is a procedure to examine the lining of the esophagus, stomach, and first part of the small intestine (duodenum). This procedure is done to check for problems such as inflammation, bleeding, ulcers, or growths. During this procedure, a long, flexible, lighted tube with a camera attached (endoscope) is inserted down the throat. Tell a health care provider about:  Any  allergies you have.  All medicines you are taking, including vitamins, herbs, eye drops, creams, and over-the-counter medicines.  Any problems you or family members have had with anesthetic medicines.  Any blood disorders you have.  Any surgeries you have had.  Any medical conditions you have.  Whether you are pregnant or may be pregnant. What are the risks? Generally, this is a safe procedure. However, problems may occur, including:  Infection.  Bleeding.  A tear (perforation) in the esophagus, stomach, or duodenum.  Trouble breathing.  Excessive sweating.  Spasms of the larynx.  A slowed heartbeat.  Low blood pressure. What happens before the procedure?  Follow instructions from your health care provider about eating or drinking restrictions.  Ask your health care provider about:  Changing or stopping your regular medicines. This is especially important if you are taking diabetes medicines or blood thinners.  Taking medicines such as aspirin and ibuprofen. These medicines can thin your blood. Do not take these medicines before your procedure if your health care provider instructs you not to.  Plan to have someone take you home after the procedure.  If you wear dentures, be ready to remove them before the procedure. What happens during the procedure?  To reduce your risk of infection, your health care team will wash or sanitize their hands.  An IV tube will be put in a vein in your hand or arm. You will get medicines and fluids through this tube.  You will be given one or more of the following:  A medicine to help you relax (sedative).  A medicine to numb the area (local anesthetic). This medicine may be sprayed into your throat. It will make you feel more comfortable and keep you from gagging or coughing during the procedure.  A medicine for pain.  A mouth guard may be placed in your mouth to protect your teeth and to keep you from biting on the  endoscope.  You will be asked to lie on your left side.  The endoscope will be lowered down your throat into your esophagus, stomach, and duodenum.  Air will be put into the endoscope. This will help your health care provider see better.  The lining of your esophagus, stomach, and duodenum will be examined.  Your health care provider may:  Take a tissue sample so it can be looked at in a lab (biopsy).  Remove growths.  Remove objects (foreign bodies) that are stuck.  Treat any bleeding with medicines or other devices that stop tissue from bleeding.  Widen (dilate) or stretch narrowed areas of your esophagus and stomach.  The endoscope will be taken out. The procedure may vary among health care providers and hospitals. What happens after the procedure?  Your blood pressure, heart rate, breathing rate, and blood oxygen level will be monitored often until the medicines you were given have worn off.  Do not eat or drink anything until the numbing medicine has worn off and your gag reflex has returned. This information is not intended to replace advice given to you by your health care provider. Make sure you discuss any questions you have with your health care provider. Document Released: 07/22/2004 Document Revised: 08/27/2015 Document Reviewed: 02/12/2015 Elsevier Interactive Patient Education  2017 [place]. Esophagogastroduodenoscopy, Care After Refer to this sheet in the next few weeks. These instructions provide you with information about caring for yourself after your procedure. Your health care provider may also give you more specific instructions. Your treatment has been planned according to current medical practices, but problems sometimes occur. Call your health care provider if you have any problems or questions after your procedure. What can I expect after the procedure? After the procedure, it is common to have:  A sore  throat.  Nausea.  Bloating.  Dizziness.  Fatigue. Follow these instructions at home:  Do not eat or drink anything until the numbing medicine (local anesthetic) has worn off and your gag reflex has returned. You will know that the local anesthetic has worn off when you can swallow comfortably.  Do not drive for 24 hours if you received a medicine to help you relax (sedative).  If your health care provider took a tissue sample for testing during the procedure, make sure to get your test results. This is your responsibility. Ask your health care provider or the department performing the test when your results will be ready.  Keep all follow-up visits as told by your health care provider. This is important. Contact a health care provider if:  You cannot stop coughing.  You are not urinating.  You are urinating less than usual. Get help right away if:  You have trouble swallowing.  You cannot eat or drink.  You have throat or chest pain that gets worse.  You are dizzy or light-headed.  You faint.  You have nausea or vomiting.  You have chills.  You have a fever.  You have severe abdominal pain.  You have black,  tarry, or bloody stools. This information is not intended to replace advice given to you by your health care provider. Make sure you discuss any questions you have with your health care provider. Document Released: 03/07/2012 Document Revised: 08/27/2015 Document Reviewed: 02/12/2015 Elsevier Interactive Patient Education  2017 [employer]  Esophageal Dilatation Esophageal dilatation is a procedure to open a blocked or narrowed part of the esophagus. The esophagus is the long tube in your throat that carries food and liquid from your mouth to your stomach. The procedure is also called esophageal dilation. You may need this procedure if you have a buildup of scar tissue in your esophagus that makes it difficult, painful, or even impossible to swallow. This can  be caused by gastroesophageal reflux disease (GERD). In rare cases, people need this procedure because they have cancer of the esophagus or a problem with the way food moves through the esophagus. Sometimes you may need to have another dilatation to enlarge the opening of the esophagus gradually. Tell a health care provider about:  Any allergies you have.  All medicines you are taking, including vitamins, herbs, eye drops, creams, and over-the-counter medicines.  Any problems you or family members have had with anesthetic medicines.  Any blood disorders you have.  Any surgeries you have had.  Any medical conditions you have.  Any antibiotic medicines you are required to take before dental procedures. What are the risks? Generally, this is a safe procedure. However, problems can occur and include:  Bleeding from a tear in the lining of the esophagus.  A hole (perforation) in the esophagus. What happens before the procedure?  Do not eat or drink anything after midnight on the night before the procedure or as directed by your health care provider.  Ask your health care provider about changing or stopping your regular medicines. This is especially important if you are taking diabetes medicines or blood thinners.  Plan to have someone take you home after the procedure. What happens during the procedure?  You will be given a medicine that makes you relaxed and sleepy (sedative).  A medicine may be sprayed or gargled to numb the back of the throat.  Your health care provider can use various instruments to do an esophageal dilatation. During the procedure, the instrument used will be placed in your mouth and passed down into your esophagus. Options include:  Simple dilators. This instrument is carefully placed in the esophagus to stretch it.  Guided wire bougies. In this method, a flexible tube (endoscope) is used to insert a wire into the esophagus. The dilator is passed over this  wire to enlarge the esophagus. Then the wire is removed.  Balloon dilators. An endoscope with a small balloon at the end is passed down into the esophagus. Inflating the balloon gently stretches the esophagus and opens it up. What happens after the procedure?  Your blood pressure, heart rate, breathing rate, and blood oxygen level will be monitored often until the medicines you were given have worn off.  Your throat may feel slightly sore and will probably still feel numb. This will improve slowly over time.  You will not be allowed to eat or drink until the throat numbness has resolved.  If this is a same-day procedure, you may be allowed to go home once you have been able to drink, urinate, and sit on the edge of the bed without nausea or dizziness.  If this is a same-day procedure, you should have a friend or family  member with you for the next 24 hours after the procedure. This information is not intended to replace advice given to you by your health care provider. Make sure you discuss any questions you have with your health care provider. Document Released: 05/12/2005 Document Revised: 08/27/2015 Document Reviewed: 07/31/2013 Elsevier Interactive Patient Education  2017 [place].  Colonoscopy, Adult A colonoscopy is an exam to look at the entire large intestine. During the exam, a lubricated, bendable tube is inserted into the anus and then passed into the rectum, colon, and other parts of the large intestine. A colonoscopy is often done as a part of normal colorectal screening or in response to certain symptoms, such as anemia, persistent diarrhea, abdominal pain, and blood in the stool. The exam can help screen for and diagnose medical problems, including:  Tumors.  Polyps.  Inflammation.  Areas of bleeding. Tell a health care provider about:  Any allergies you have.  All medicines you are taking, including vitamins, herbs, eye drops, creams, and over-the-counter  medicines.  Any problems you or family members have had with anesthetic medicines.  Any blood disorders you have.  Any surgeries you have had.  Any medical conditions you have.  Any problems you have had passing stool. What are the risks? Generally, this is a safe procedure. However, problems may occur, including:  Bleeding.  A tear in the intestine.  A reaction to medicines given during the exam.  Infection (rare). What happens before the procedure? Eating and drinking restrictions  Follow instructions from your health care provider about eating and drinking, which may include:  A few days before the procedure - follow a low-fiber diet. Avoid nuts, seeds, dried fruit, raw fruits, and vegetables.  1-3 days before the procedure - follow a clear liquid diet. Drink only clear liquids, such as clear broth or bouillon, black coffee or tea, clear juice, clear soft drinks or sports drinks, gelatin dessert, and popsicles. Avoid any liquids that contain red or purple dye.  On the day of the procedure - do not eat or drink anything during the 2 hours before the procedure, or within the time period that your health care provider recommends. Bowel prep  If you were prescribed an oral bowel prep to clean out your colon:  Take it as told by your health care provider. Starting the day before your procedure, you will need to drink a large amount of medicated liquid. The liquid will cause you to have multiple loose stools until your stool is almost clear or light green.  If your skin or anus gets irritated from diarrhea, you may use these to relieve the irritation:  Medicated wipes, such as adult wet wipes with aloe and vitamin E.  A skin soothing-product like petroleum jelly.  If you vomit while drinking the bowel prep, take a break for up to 60 minutes and then begin the bowel prep again. If vomiting continues and you cannot take the bowel prep without vomiting, call your health care  provider. General instructions   Ask your health care provider about changing or stopping your regular medicines. This is especially important if you are taking diabetes medicines or blood thinners.  Plan to have someone take you home from the hospital or clinic. What happens during the procedure?  An IV tube may be inserted into one of your veins.  You will be given medicine to help you relax (sedative).  To reduce your risk of infection:  Your health care team will wash or  sanitize their hands.  Your anal area will be washed with soap.  You will be asked to lie on your side with your knees bent.  Your health care provider will lubricate a long, thin, flexible tube. The tube will have a camera and a light on the end.  The tube will be inserted into your anus.  The tube will be gently eased through your rectum and colon.  Air will be delivered into your colon to keep it open. You may feel some pressure or cramping.  The camera will be used to take images during the procedure.  A small tissue sample may be removed from your body to be examined under a microscope (biopsy). If any potential problems are found, the tissue will be sent to a lab for testing.  If small polyps are found, your health care provider may remove them and have them checked for cancer cells.  The tube that was inserted into your anus will be slowly removed. The procedure may vary among health care providers and hospitals. What happens after the procedure?  Your blood pressure, heart rate, breathing rate, and blood oxygen level will be monitored until the medicines you were given have worn off.  Do not drive for 24 hours after the exam.  You may have a small amount of blood in your stool.  You may pass gas and have mild abdominal cramping or bloating due to the air that was used to inflate your colon during the exam.  It is up to you to get the results of your procedure. Ask your health care provider,  or the department performing the procedure, when your results will be ready. This information is not intended to replace advice given to you by your health care provider. Make sure you discuss any questions you have with your health care provider. Document Released: 03/18/2000 Document Revised: 01/20/2016 Document Reviewed: 06/02/2015 Elsevier Interactive Patient Education  2017 [employer]  Colonoscopy, Adult, Care After This sheet gives you information about how to care for yourself after your procedure. Your health care provider may also give you more specific instructions. If you have problems or questions, contact your health care provider. What can I expect after the procedure? After the procedure, it is common to have:  A small amount of blood in your stool for 24 hours after the procedure.  Some gas.  Mild abdominal cramping or bloating. Follow these instructions at home: General instructions    For the first 24 hours after the procedure:  Do not drive or use machinery.  Do not sign important documents.  Do not drink alcohol.  Do your regular daily activities at a slower pace than normal.  Eat soft, easy-to-digest foods.  Rest often.  Take over-the-counter or prescription medicines only as told by your health care provider.  It is up to you to get the results of your procedure. Ask your health care provider, or the department performing the procedure, when your results will be ready. Relieving cramping and bloating   Try walking around when you have cramps or feel bloated.  Apply heat to your abdomen as told by your health care provider. Use a heat source that your health care provider recommends, such as a moist heat pack or a heating pad.  Place a towel between your skin and the heat source.  Leave the heat on for 20-30 minutes.  Remove the heat if your skin turns bright red. This is especially important if you are unable to  feel pain, heat, or cold. You may  have a greater risk of getting burned. Eating and drinking   Drink enough fluid to keep your urine clear or pale yellow.  Resume your normal diet as instructed by your health care provider. Avoid heavy or fried foods that are hard to digest.  Avoid drinking alcohol for as long as instructed by your health care provider. Contact a health care provider if:  You have blood in your stool 2-3 days after the procedure. Get help right away if:  You have more than a small spotting of blood in your stool.  You pass large blood clots in your stool.  Your abdomen is swollen.  You have nausea or vomiting.  You have a fever.  You have increasing abdominal pain that is not relieved with medicine. This information is not intended to replace advice given to you by your health care provider. Make sure you discuss any questions you have with your health care provider. Document Released: 11/03/2003 Document Revised: 12/14/2015 Document Reviewed: 06/02/2015 Elsevier Interactive Patient Education  2017 [place] Anesthesia is a term that refers to techniques, procedures, and medicines that help a person stay safe and comfortable during a medical procedure. Monitored anesthesia care, or sedation, is one type of anesthesia. Your anesthesia specialist may recommend sedation if you will be having a procedure that does not require you to be unconscious, such as:  Cataract surgery.  A dental procedure.  A biopsy.  A colonoscopy. During the procedure, you may receive a medicine to help you relax (sedative). There are three levels of sedation:  Mild sedation. At this level, you may feel awake and relaxed. You will be able to follow directions.  Moderate sedation. At this level, you will be sleepy. You may not remember the procedure.  Deep sedation. At this level, you will be asleep. You will not remember the procedure. The more medicine you are given, the deeper your  level of sedation will be. Depending on how you respond to the procedure, the anesthesia specialist may change your level of sedation or the type of anesthesia to fit your needs. An anesthesia specialist will monitor you closely during the procedure. Let your health care provider know about:  Any allergies you have.  All medicines you are taking, including vitamins, herbs, eye drops, creams, and over-the-counter medicines.  Any use of steroids (by mouth or as a cream).  Any problems you or family members have had with sedatives and anesthetic medicines.  Any blood disorders you have.  Any surgeries you have had.  Any medical conditions you have, such as sleep apnea.  Whether you are pregnant or may be pregnant.  Any use of cigarettes, alcohol, or street drugs. What are the risks? Generally, this is a safe procedure. However, problems may occur, including:  Getting too much medicine (oversedation).  Nausea.  Allergic reaction to medicines.  Trouble breathing. If this happens, a breathing tube may be used to help with breathing. It will be removed when you are awake and breathing on your own.  Heart trouble.  Lung trouble. Before the procedure Staying hydrated  Follow instructions from your health care provider about hydration, which may include:  Up to 2 hours before the procedure - you may continue to drink clear liquids, such as water, clear fruit juice, black coffee, and plain tea. Eating and drinking restrictions  Follow instructions from your health care provider about eating and drinking, which may include:  8 hours before the procedure - stop eating heavy meals or foods such as meat, fried foods, or fatty foods.  6 hours before the procedure - stop eating light meals or foods, such as toast or cereal.  6 hours before the procedure - stop drinking milk or drinks that contain milk.  2 hours before the procedure - stop drinking clear liquids. Medicines  Ask your  health care provider about:  Changing or stopping your regular medicines. This is especially important if you are taking diabetes medicines or blood thinners.  Taking medicines such as aspirin and ibuprofen. These medicines can thin your blood. Do not take these medicines before your procedure if your health care provider instructs you not to. Tests and exams  You will have a physical exam.  You may have blood tests done to show:  How well your kidneys and liver are working.  How well your blood can clot.  General instructions  Plan to have someone take you home from the hospital or clinic.  If you will be going home right after the procedure, plan to have someone with you for 24 hours. What happens during the procedure?  Your blood pressure, heart rate, breathing, level of pain and overall condition will be monitored.  An IV tube will be inserted into one of your veins.  Your anesthesia specialist will give you medicines as needed to keep you comfortable during the procedure. This may mean changing the level of sedation.  The procedure will be performed. After the procedure  Your blood pressure, heart rate, breathing rate, and blood oxygen level will be monitored until the medicines you were given have worn off.  Do not drive for 24 hours if you received a sedative.  You may:  Feel sleepy, clumsy, or nauseous.  Feel forgetful about what happened after the procedure.  Have a sore throat if you had a breathing tube during the procedure.  Vomit. This information is not intended to replace advice given to you by your health care provider. Make sure you discuss any questions you have with your health care provider. Document Released: 12/15/2004 Document Revised: 08/28/2015 Document Reviewed: 07/12/2015 Elsevier Interactive Patient Education  2017 [place], Care After These instructions provide you with information about caring for yourself  after your procedure. Your health care provider may also give you more specific instructions. Your treatment has been planned according to current medical practices, but problems sometimes occur. Call your health care provider if you have any problems or questions after your procedure. What can I expect after the procedure? After your procedure, it is common to:  Feel sleepy for several hours.  Feel clumsy and have poor balance for several hours.  Feel forgetful about what happened after the procedure.  Have poor judgment for several hours.  Feel nauseous or vomit.  Have a sore throat if you had a breathing tube during the procedure. Follow these instructions at home: For at least 24 hours after the procedure:    Do not:  Participate in activities in which you could fall or become injured.  Drive.  Use heavy machinery.  Drink alcohol.  Take sleeping pills or medicines that cause drowsiness.  Make important decisions or sign legal documents.  Take care of children on your own.  Rest. Eating and drinking   Follow the diet that is recommended by your health care provider.  If you vomit, drink water, juice, or soup when you can drink without vomiting.  Make sure you have little or no nausea before eating solid foods. General instructions   Have a responsible adult stay with you until you are awake and alert.  Take over-the-counter and prescription medicines only as told by your health care provider.  If you smoke, do not smoke without supervision.  Keep all follow-up visits as told by your health care provider. This is important. Contact a health care provider if:  You keep feeling nauseous or you keep vomiting.  You feel light-headed.  You develop a rash.  You have a fever. Get help right away if:  You have trouble breathing. This information is not intended to replace advice given to you by your health care provider. Make sure you discuss any questions  you have with your health care provider. Document Released: 07/12/2015 Document Revised: 11/11/2015 Document Reviewed: 07/12/2015 Elsevier Interactive Patient Education  2017 [employer].

## 2016-06-07 ENCOUNTER — Encounter (HOSPITAL_COMMUNITY)
Admission: RE | Admit: 2016-06-07 | Discharge: 2016-06-07 | Disposition: A | Discharge status: Home / Self Care | Payer: Medicaid Other | Source: Ambulatory Visit | Attending: Internal Medicine | Admitting: Internal Medicine

## 2016-06-07 ENCOUNTER — Ambulatory Visit (HOSPITAL_COMMUNITY)
Admission: RE | Admit: 2016-06-07 | Discharge: 2016-06-07 | Disposition: A | Discharge status: Home / Self Care | Payer: Medicaid Other | Source: Ambulatory Visit | Attending: Internal Medicine | Admitting: Internal Medicine

## 2016-06-07 ENCOUNTER — Other Ambulatory Visit (HOSPITAL_COMMUNITY): Payer: Self-pay | Admitting: Internal Medicine

## 2016-06-07 ENCOUNTER — Encounter (HOSPITAL_COMMUNITY): Payer: Self-pay

## 2016-06-07 DIAGNOSIS — Z01812 Encounter for preprocedural laboratory examination: Secondary | ICD-10-CM | POA: Diagnosis present

## 2016-06-07 DIAGNOSIS — M25521 Pain in right elbow: Secondary | ICD-10-CM

## 2016-06-07 DIAGNOSIS — Z01818 Encounter for other preprocedural examination: Secondary | ICD-10-CM | POA: Diagnosis not present

## 2016-06-07 LAB — CBC WITH DIFFERENTIAL/PLATELET
BASOS ABS: 0 10*3/uL (ref 0.0–0.1)
BASOS PCT: 0 %
EOS ABS: 0.2 10*3/uL (ref 0.0–0.7)
EOS PCT: 2 %
HCT: 44.3 % (ref 36.0–46.0)
Hemoglobin: 15 g/dL (ref 12.0–15.0)
Lymphocytes Relative: 25 %
Lymphs Abs: 2.5 10*3/uL (ref 0.7–4.0)
MCH: 31.8 pg (ref 26.0–34.0)
MCHC: 33.9 g/dL (ref 30.0–36.0)
MCV: 93.9 fL (ref 78.0–100.0)
Monocytes Absolute: 0.9 10*3/uL (ref 0.1–1.0)
Monocytes Relative: 9 %
Neutro Abs: 6.7 10*3/uL (ref 1.7–7.7)
Neutrophils Relative %: 64 %
PLATELETS: 264 10*3/uL (ref 150–400)
RBC: 4.72 MIL/uL (ref 3.87–5.11)
RDW: 14.5 % (ref 11.5–15.5)
WBC: 10.3 10*3/uL (ref 4.0–10.5)

## 2016-06-07 LAB — BASIC METABOLIC PANEL
ANION GAP: 10 (ref 5–15)
BUN: 14 mg/dL (ref 6–20)
CO2: 20 mmol/L — ABNORMAL LOW (ref 22–32)
Calcium: 9.3 mg/dL (ref 8.9–10.3)
Chloride: 106 mmol/L (ref 101–111)
Creatinine, Ser: 0.82 mg/dL (ref 0.44–1.00)
GFR calc Af Amer: >60 mL/min (ref >60)
Glucose, Bld: 84 mg/dL (ref 65–99)
POTASSIUM: 4.1 mmol/L (ref 3.5–5.1)
SODIUM: 136 mmol/L (ref 135–145)

## 2016-06-08 ENCOUNTER — Ambulatory Visit: Payer: Medicaid Other | Admitting: Obstetrics and Gynecology

## 2016-06-13 ENCOUNTER — Encounter (HOSPITAL_COMMUNITY)
Admission: RE | Disposition: A | Discharge status: Home / Self Care | Payer: Self-pay | Source: Ambulatory Visit | Attending: Internal Medicine

## 2016-06-13 ENCOUNTER — Ambulatory Visit (HOSPITAL_COMMUNITY): Payer: Medicaid Other | Admitting: Anesthesiology

## 2016-06-13 ENCOUNTER — Ambulatory Visit (HOSPITAL_COMMUNITY)
Admission: RE | Admit: 2016-06-13 | Discharge: 2016-06-13 | Disposition: A | Discharge status: Home / Self Care | Payer: Medicaid Other | Source: Ambulatory Visit | Attending: Internal Medicine | Admitting: Internal Medicine

## 2016-06-13 ENCOUNTER — Encounter (HOSPITAL_COMMUNITY): Payer: Self-pay | Admitting: *Deleted

## 2016-06-13 DIAGNOSIS — K209 Esophagitis, unspecified: Secondary | ICD-10-CM

## 2016-06-13 DIAGNOSIS — K3189 Other diseases of stomach and duodenum: Secondary | ICD-10-CM | POA: Diagnosis not present

## 2016-06-13 DIAGNOSIS — R1310 Dysphagia, unspecified: Secondary | ICD-10-CM | POA: Insufficient documentation

## 2016-06-13 DIAGNOSIS — Z79899 Other long term (current) drug therapy: Secondary | ICD-10-CM | POA: Insufficient documentation

## 2016-06-13 DIAGNOSIS — K5730 Diverticulosis of large intestine without perforation or abscess without bleeding: Secondary | ICD-10-CM | POA: Insufficient documentation

## 2016-06-13 DIAGNOSIS — K449 Diaphragmatic hernia without obstruction or gangrene: Secondary | ICD-10-CM | POA: Diagnosis not present

## 2016-06-13 DIAGNOSIS — Z1212 Encounter for screening for malignant neoplasm of rectum: Secondary | ICD-10-CM | POA: Diagnosis not present

## 2016-06-13 DIAGNOSIS — Z791 Long term (current) use of non-steroidal anti-inflammatories (NSAID): Secondary | ICD-10-CM | POA: Diagnosis not present

## 2016-06-13 DIAGNOSIS — F17210 Nicotine dependence, cigarettes, uncomplicated: Secondary | ICD-10-CM | POA: Insufficient documentation

## 2016-06-13 DIAGNOSIS — Z1211 Encounter for screening for malignant neoplasm of colon: Secondary | ICD-10-CM | POA: Diagnosis not present

## 2016-06-13 DIAGNOSIS — F329 Major depressive disorder, single episode, unspecified: Secondary | ICD-10-CM | POA: Diagnosis not present

## 2016-06-13 DIAGNOSIS — K210 Gastro-esophageal reflux disease with esophagitis: Secondary | ICD-10-CM | POA: Diagnosis not present

## 2016-06-13 DIAGNOSIS — K2970 Gastritis, unspecified, without bleeding: Secondary | ICD-10-CM | POA: Diagnosis not present

## 2016-06-13 DIAGNOSIS — F419 Anxiety disorder, unspecified: Secondary | ICD-10-CM | POA: Insufficient documentation

## 2016-06-13 DIAGNOSIS — K319 Disease of stomach and duodenum, unspecified: Secondary | ICD-10-CM | POA: Insufficient documentation

## 2016-06-13 HISTORY — PX: MALONEY DILATION: SHX5535

## 2016-06-13 HISTORY — PX: COLONOSCOPY WITH PROPOFOL: SHX5780

## 2016-06-13 HISTORY — PX: ESOPHAGOGASTRODUODENOSCOPY (EGD) WITH PROPOFOL: SHX5813

## 2016-06-13 SURGERY — COLONOSCOPY WITH PROPOFOL
Anesthesia: Monitor Anesthesia Care

## 2016-06-13 MED ORDER — FENTANYL CITRATE (PF) 100 MCG/2ML IJ SOLN
25.0000 ug | INTRAMUSCULAR | Status: AC
  Administered 2016-06-13 (×2): 25 ug via INTRAVENOUS

## 2016-06-13 MED ORDER — MIDAZOLAM HCL 2 MG/2ML IJ SOLN
1.0000 mg | INTRAMUSCULAR | Status: AC
  Administered 2016-06-13 (×2): 2 mg via INTRAVENOUS
  Filled 2016-06-13 (×2): qty 2

## 2016-06-13 MED ORDER — MIDAZOLAM HCL 2 MG/2ML IJ SOLN
INTRAMUSCULAR | Status: AC
  Filled 2016-06-13: qty 2

## 2016-06-13 MED ORDER — CHLORHEXIDINE GLUCONATE CLOTH 2 % EX PADS: 6.0000 | MEDICATED_PAD | Freq: Once | CUTANEOUS | Status: DC

## 2016-06-13 MED ORDER — FENTANYL CITRATE (PF) 100 MCG/2ML IJ SOLN
INTRAMUSCULAR | Status: AC
  Filled 2016-06-13: qty 2

## 2016-06-13 MED ORDER — PROPOFOL 10 MG/ML IV BOLUS
INTRAVENOUS | Status: AC
  Filled 2016-06-13: qty 40

## 2016-06-13 MED ORDER — LACTATED RINGERS IV SOLN
INTRAVENOUS | Status: DC
  Administered 2016-06-13: 09:00:00 via INTRAVENOUS

## 2016-06-13 MED ORDER — PROPOFOL 500 MG/50ML IV EMUL
INTRAVENOUS | Status: DC | PRN
  Administered 2016-06-13: 100 ug/kg/min via INTRAVENOUS
  Administered 2016-06-13: 10:00:00 via INTRAVENOUS

## 2016-06-13 MED ORDER — MIDAZOLAM HCL 5 MG/5ML IJ SOLN
INTRAMUSCULAR | Status: DC | PRN
  Administered 2016-06-13: 1 mg via INTRAVENOUS

## 2016-06-13 NOTE — Discharge Instructions (Signed)
°Colonoscopy °Discharge Instructions ° °Read the instructions outlined below and refer to this sheet in the next few weeks. These discharge instructions provide you with general information on caring for yourself after you leave the hospital. Your doctor may also give you specific instructions. While your treatment has been planned according to the most current medical practices available, unavoidable complications occasionally occur. If you have any problems or questions after discharge, call Dr. Yolando Gillum at [phone]. °ACTIVITY °· You may resume your regular activity, but move at a slower pace for the next 24 hours.  °· Take frequent rest periods for the next 24 hours.  °· Walking will help get rid of the air and reduce the bloated feeling in your belly (abdomen).  °· No driving for 24 hours (because of the medicine (anesthesia) used during the test).   °· Do not sign any important legal documents or operate any machinery for 24 hours (because of the anesthesia used during the test).  °NUTRITION °· Drink plenty of fluids.  °· You may resume your normal diet as instructed by your doctor.  °· Begin with a light meal and progress to your normal diet. Heavy or fried foods are harder to digest and may make you feel sick to your stomach (nauseated).  °· Avoid alcoholic beverages for 24 hours or as instructed.  °MEDICATIONS °· You may resume your normal medications unless your doctor tells you otherwise.  °WHAT YOU CAN EXPECT TODAY °· Some feelings of bloating in the abdomen.  °· Passage of more gas than usual.  °· Spotting of blood in your stool or on the toilet paper.  °IF YOU HAD POLYPS REMOVED DURING THE COLONOSCOPY: °· No aspirin products for 7 days or as instructed.  °· No alcohol for 7 days or as instructed.  °· Eat a soft diet for the next 24 hours.  °FINDING OUT THE RESULTS OF YOUR TEST °Not all test results are available during your visit. If your test results are not back during the visit, make an appointment  with your caregiver to find out the results. Do not assume everything is normal if you have not heard from your caregiver or the medical facility. It is important for you to follow up on all of your test results.  °SEEK IMMEDIATE MEDICAL ATTENTION IF: °· You have more than a spotting of blood in your stool.  °· Your belly is swollen (abdominal distention).  °· You are nauseated or vomiting.  °· You have a temperature over 101.  °· You have abdominal pain or discomfort that is severe or gets worse throughout the day.  °EGD °Discharge instructions °Please read the instructions outlined below and refer to this sheet in the next few weeks. These discharge instructions provide you with general information on caring for yourself after you leave the hospital. Your doctor may also give you specific instructions. While your treatment has been planned according to the most current medical practices available, unavoidable complications occasionally occur. If you have any problems or questions after discharge, please call your doctor. °ACTIVITY °· You may resume your regular activity but move at a slower pace for the next 24 hours.  °· Take frequent rest periods for the next 24 hours.  °· Walking will help expel (get rid of) the air and reduce the bloated feeling in your abdomen.  °· No driving for 24 hours (because of the anesthesia (medicine) used during the test).  °· You may shower.  °· Do not sign any important   legal documents or operate any machinery for 24 hours (because of the anesthesia used during the test).  NUTRITION  Drink plenty of fluids.   You may resume your normal diet.   Begin with a light meal and progress to your normal diet.   Avoid alcoholic beverages for 24 hours or as instructed by your caregiver.  MEDICATIONS  You may resume your normal medications unless your caregiver tells you otherwise.  WHAT YOU CAN EXPECT TODAY  You may experience abdominal discomfort such as a feeling of fullness  or gas pains.  FOLLOW-UP  Your doctor will discuss the results of your test with you.  SEEK IMMEDIATE MEDICAL ATTENTION IF ANY OF THE FOLLOWING OCCUR:  Excessive nausea (feeling sick to your stomach) and/or vomiting.   Severe abdominal pain and distention (swelling).   Trouble swallowing.   Temperature over 101 F (37.8 C).   Rectal bleeding or vomiting of blood.    Current information provided  Preparation a day was inadequate for your colonoscopy. I recommend a repeat colonoscopy in one year. No greasy foods for preparation Next time  Further recommendations to follow pending review of pathology report  Stop Protonix; begin Dexilant 60 mg daily. Patient to go by my office for 3 week supply free samples.

## 2016-06-13 NOTE — Op Note (Signed)
[facility] Patient Name: Meredith [name] Procedure Date: 06/13/2016 9:05 AM MRN: [id number] Date of Birth: 12/06/1965 Attending MD: Norvel Richards , MD CSN: [id number] Age: 51 Admit Type: Ambulatory Procedure:                Upper GI endoscopy Indications:              Dysphagia Providers:                Norvel Richards, MD, Carmelina Dane. Sheppard Coil MD,                            MD, Otis Peak B. Sharon Seller, RN, Lurline Del, RN Referring MD:              Medicines:                Propofol per Anesthesia Complications:            No immediate complications. Estimated Blood Loss:     Estimated blood loss was minimal. Procedure:                Pre-Anesthesia Assessment:                           - Prior to the procedure, a History and Physical                            was performed, and patient medications and                            allergies were reviewed. The patient's tolerance of                            previous anesthesia was also reviewed. The risks                            and benefits of the procedure and the sedation                            options and risks were discussed with the patient.                            All questions were answered, and informed consent                            was obtained. Prior Anticoagulants: The patient has                            taken no previous anticoagulant or antiplatelet                            agents. ASA Grade Assessment: II - A patient with                            mild systemic disease. After reviewing the risks  and benefits, the patient was deemed in                            satisfactory condition to undergo the procedure.                           After obtaining informed consent, the endoscope was                            passed under direct vision. Throughout the                            procedure, the patient's blood pressure, pulse, and   oxygen saturations were monitored continuously. The                            [id number] ([id number]) scope was introduced through the                            and advanced to the second part of duodenum. The                            upper GI endoscopy was accomplished without                            difficulty. The patient tolerated the procedure                            well. Scope In: 9:22:22 AM Scope Out: 9:33:09 AM Total Procedure Duration: 0 hours 10 minutes 47 seconds  Findings:      LA Grade A (one or more mucosal breaks less than 5 mm, not extending       between tops of 2 mucosal folds) esophagitis was found 35 to 36 cm from       the incisors. No Barrett's epithelium seen. Tubular esophagus patent       throughout its course.The scope was withdrawn. Dilation was performed       with a Maloney dilator with no resistance at 29 Fr. Dilation was       performed with a Maloney dilator with mild resistance at 56 Fr. The       dilation site was examined following endoscope reinsertion and showed       mild improvement in luminal narrowing. Estimated blood loss was minimal.      A small hiatal hernia was present.      Mucosal changes were found in the stomach. ongested eroded/friable       mucoas diffusely. This was biopsied with a cold forceps for histology.      The duodenal bulb and second portion of the duodenum were normal. Impression:               - LA Grade A esophagitis. Dilated.                           - Small hiatal hernia.                           -  inflamed appearing gastric mucosa?"status post                            biopsy                           - Normal duodenal bulb and second portion of the                            duodenum.                           - Moderate Sedation:      Moderate (conscious) sedation was personally administered by an       anesthesia professional. The following parameters were monitored: oxygen       saturation, heart rate,  blood pressure, respiratory rate, EKG, adequacy       of pulmonary ventilation, and response to care. Total physician       intraservice time was 30 minutes. Recommendation:           - Patient has a contact number available for                            emergencies. The signs and symptoms of potential                            delayed complications were discussed with the                            patient. Return to normal activities tomorrow.                            Written discharge instructions were provided to the                            patient.                           - Resume previous diet.                           - Continue present medications.                           - Await pathology results.                           - No repeat upper endoscopy.                           - Return to GI office in 3 months. Procedure Code(s):        --- Professional ---                           [phone], Esophagogastroduodenoscopy, flexible,                            transoral; with  biopsy, single or multiple                           [id number], Dilation of esophagus, by unguided sound or                            bougie, single or multiple passes Diagnosis Code(s):        --- Professional ---                           K20.9, Esophagitis, unspecified                           K44.9, Diaphragmatic hernia without obstruction or                            gangrene                           K31.89, Other diseases of stomach and duodenum                           R13.10, Dysphagia, unspecified CPT copyright 2016 American Medical Association. All rights reserved. The codes documented in this report are preliminary and upon coder review may  be revised to meet current compliance requirements. Cristopher Estimable. Hayk Divis, MD Norvel Richards, MD 06/13/2016 10:08:02 AM This report has been signed electronically. Gayland Curry MD, MD Number of Addenda: 0

## 2016-06-13 NOTE — Anesthesia Preprocedure Evaluation (Signed)
Anesthesia Evaluation  Patient identified by MRN, date of birth, ID band Patient awake    Reviewed: Allergy & Precautions, NPO status , Patient's Chart, lab work & pertinent test results  Airway Mallampati: II  TM Distance: >3 FB     Dental  (+) Partial Upper   Pulmonary Current Smoker,    breath sounds clear to auscultation       Cardiovascular negative cardio ROS   Rhythm:Regular Rate:Normal     Neuro/Psych PSYCHIATRIC DISORDERS Bipolar Disorder Schizophrenia    GI/Hepatic GERD  ,(+)     substance abuse (denies recent use)  cocaine use and methamphetamine use,   Endo/Other    Renal/GU      Musculoskeletal  (+) Arthritis , Fibromyalgia -  Abdominal   Peds  Hematology   Anesthesia Other Findings   Reproductive/Obstetrics                             Anesthesia Physical Anesthesia Plan  ASA: III  Anesthesia Plan: MAC   Post-op Pain Management:    Induction: Intravenous  Airway Management Planned: Simple Face Mask  Additional Equipment:   Intra-op Plan:   Post-operative Plan:   Informed Consent: I have reviewed the patients History and Physical, chart, labs and discussed the procedure including the risks, benefits and alternatives for the proposed anesthesia with the patient or authorized representative who has indicated his/her understanding and acceptance.     Plan Discussed with:   Anesthesia Plan Comments:         Anesthesia Quick Evaluation

## 2016-06-13 NOTE — Transfer of Care (Signed)
Immediate Anesthesia Transfer of Care Note  Patient: Meredith [name]  Procedure(s) Performed: Procedure(s) with comments: COLONOSCOPY WITH PROPOFOL (N/A) - 9:45am ESOPHAGOGASTRODUODENOSCOPY (EGD) WITH PROPOFOL (N/A) MALONEY DILATION (N/A)  Patient Location: PACU  Anesthesia Type:MAC  Level of Consciousness: awake and patient cooperative  Airway & Oxygen Therapy: Patient Spontanous Breathing and Patient connected to face mask oxygen  Post-op Assessment: Report given to RN, Post -op Vital signs reviewed and stable and Patient moving all extremities  Post vital signs: Reviewed and stable  Last Vitals:  Vitals:   06/13/16 0900 06/13/16 0905  BP: 114/77 117/80  Resp: 13 13  Temp:      Last Pain:  Vitals:   06/13/16 0826  TempSrc: Oral         Complications: No apparent anesthesia complications

## 2016-06-13 NOTE — Anesthesia Postprocedure Evaluation (Signed)
Anesthesia Post Note  Patient: Meredith [name]  Procedure(s) Performed: Procedure(s) (LRB): COLONOSCOPY WITH PROPOFOL (N/A) ESOPHAGOGASTRODUODENOSCOPY (EGD) WITH PROPOFOL (N/A) MALONEY DILATION (N/A)  Patient location during evaluation: PACU Anesthesia Type: MAC Level of consciousness: awake and patient cooperative Pain management: pain level controlled Vital Signs Assessment: post-procedure vital signs reviewed and stable Respiratory status: spontaneous breathing, nonlabored ventilation and respiratory function stable Cardiovascular status: blood pressure returned to baseline Postop Assessment: no signs of nausea or vomiting Anesthetic complications: no     Last Vitals:  Vitals:   06/13/16 0826  Temp: 36.7 C    Last Pain:  Vitals:   06/13/16 0826  TempSrc: Oral                 [name] J

## 2016-06-13 NOTE — Anesthesia Postprocedure Evaluation (Signed)
Anesthesia Post Note  Patient: Meredith [name]  Procedure(s) Performed: Procedure(s) (LRB): COLONOSCOPY WITH PROPOFOL (N/A) ESOPHAGOGASTRODUODENOSCOPY (EGD) WITH PROPOFOL (N/A) MALONEY DILATION (N/A)  Patient location during evaluation: PACU Anesthesia Type: MAC Level of consciousness: awake and patient cooperative Pain management: pain level controlled Vital Signs Assessment: post-procedure vital signs reviewed and stable Respiratory status: spontaneous breathing, nonlabored ventilation and respiratory function stable Cardiovascular status: blood pressure returned to baseline Postop Assessment: no signs of nausea or vomiting Anesthetic complications: no     Last Vitals:  Vitals:   06/13/16 0900 06/13/16 0905  BP: 114/77 117/80  Resp: 13 13  Temp:      Last Pain:  Vitals:   06/13/16 0826  TempSrc: Oral                 Coree Riester J

## 2016-06-13 NOTE — Op Note (Signed)
[facility] Patient Name: Meredith [name] Procedure Date: 06/13/2016 9:37 AM MRN: [id number] Date of Birth: 12-May-1965 Attending MD: Norvel Richards , MD CSN: [id number] Age: 51 Admit Type: Ambulatory Procedure:                Colonoscopy - Screening Indications:              Screening for colorectal malignant neoplasm Providers:                Norvel Richards, MD, Gwenlyn Fudge RN, RN, Lurline Del, RN Referring MD:              Medicines:                Propofol per Anesthesia Complications:            No immediate complications. Estimated Blood Loss:     Estimated blood loss: none. Procedure:                Pre-Anesthesia Assessment:                           - Prior to the procedure, a History and Physical                            was performed, and patient medications and                            allergies were reviewed. The patient's tolerance of                            previous anesthesia was also reviewed. The risks                            and benefits of the procedure and the sedation                            options and risks were discussed with the patient.                            All questions were answered, and informed consent                            was obtained. Prior Anticoagulants: The patient has                            taken no previous anticoagulant or antiplatelet                            agents. ASA Grade Assessment: II - A patient with                            mild systemic disease. After reviewing the risks  and benefits, the patient was deemed in                            satisfactory condition to undergo the procedure.                           After obtaining informed consent, the colonoscope                            was passed under direct vision. Throughout the                            procedure, the patient's blood pressure, pulse, and   oxygen saturations were monitored continuously. The                            [id number] ([id number]) scope was introduced through                            the and advanced to the the cecum, identified by                            appendiceal orifice and ileocecal valve. The                            colonoscopy was performed without difficulty. The                            patient tolerated the procedure well. The quality                            of the bowel preparation was inadequate. The                            ileocecal valve, appendiceal orifice, and rectum                            were photographed. Scope In: 9:41:01 AM Scope Out: 9:54:29 AM Scope Withdrawal Time: 0 hours 9 minutes 37 seconds  Total Procedure Duration: 0 hours 13 minutes 28 seconds  Findings:      The perianal and digital rectal examinations were normal. Rectal vault       small. Unable to retroflex.      Multiple small and large-mouthed diverticula were found in the sigmoid       colon and descending colon.      The exam was otherwise without abnormality. Prep grossly inadequate with       greasy film throughout colon which precluded good detailed views of the       mucosa. Impression:               - Preparation of the colon was inadequate.                           - Diverticulosis in the sigmoid colon and in the  descending colon.                           - The examination was otherwise normal.                           - No specimens collected. Moderate Sedation:      Moderate (conscious) sedation was personally administered by an       anesthesia professional. The following parameters were monitored: oxygen       saturation, heart rate, blood pressure, respiratory rate, EKG, adequacy       of pulmonary ventilation, and response to care. Total physician       intraservice time was 40 minutes. Recommendation:           - Patient has a contact number available for                             emergencies. The signs and symptoms of potential                            delayed complications were discussed with the                            patient. Return to normal activities tomorrow.                            Written discharge instructions were provided to the                            patient.                           - Resume previous diet.                           - Continue present medications.                           - Repeat colonoscopy in 1 year for screening                            purposes.                           - Return to GI office in 3 months. See EGD report.                            Begin Dexilant 60 mg daily - go by my office for                            free samples. Procedure Code(s):        --- Professional ---                           [phone], Colonoscopy, flexible; diagnostic, including  collection of specimen(s) by brushing or washing,                            when performed (separate procedure) Diagnosis Code(s):        --- Professional ---                           Z12.11, Encounter for screening for malignant                            neoplasm of colon                           K57.30, Diverticulosis of large intestine without                            perforation or abscess without bleeding CPT copyright 2016 American Medical Association. All rights reserved. The codes documented in this report are preliminary and upon coder review may  be revised to meet current compliance requirements. Cristopher Estimable. Rourk, MD Norvel Richards, MD 06/13/2016 10:13:26 AM This report has been signed electronically. Number of Addenda: 0
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
## 2016-06-14 ENCOUNTER — Encounter: Payer: Self-pay | Admitting: Cardiology

## 2016-06-14 ENCOUNTER — Ambulatory Visit (INDEPENDENT_AMBULATORY_CARE_PROVIDER_SITE_OTHER): Payer: Medicaid Other | Admitting: Cardiology

## 2016-06-14 VITALS — BP 114/72 | HR 85 | Ht 65.0 in | Wt 152.4 lb

## 2016-06-14 DIAGNOSIS — F419 Anxiety disorder, unspecified: Secondary | ICD-10-CM

## 2016-06-14 DIAGNOSIS — Z720 Tobacco use: Secondary | ICD-10-CM | POA: Diagnosis not present

## 2016-06-14 DIAGNOSIS — R002 Palpitations: Secondary | ICD-10-CM | POA: Diagnosis not present

## 2016-06-14 DIAGNOSIS — Z79899 Other long term (current) drug therapy: Secondary | ICD-10-CM | POA: Diagnosis not present

## 2016-06-14 DIAGNOSIS — F418 Other specified anxiety disorders: Secondary | ICD-10-CM | POA: Diagnosis not present

## 2016-06-14 DIAGNOSIS — F329 Major depressive disorder, single episode, unspecified: Secondary | ICD-10-CM

## 2016-06-14 NOTE — Patient Instructions (Signed)
   Your physician recommends that you continue on your current medications as directed. Please refer to the Current Medication list given to you today.    Your physician wants you to follow-up in: 8 months. You will receive a reminder letter in the mail 6 months in advance. If you don't receive a letter, please call our office to schedule the follow-up appointment.

## 2016-06-16 ENCOUNTER — Encounter: Payer: Self-pay | Admitting: Internal Medicine

## 2016-06-17 ENCOUNTER — Encounter (HOSPITAL_COMMUNITY): Payer: Self-pay | Admitting: Internal Medicine

## 2016-06-21 ENCOUNTER — Ambulatory Visit (HOSPITAL_COMMUNITY)
Admission: RE | Admit: 2016-06-21 | Discharge: 2016-06-21 | Disposition: A | Discharge status: Home / Self Care | Payer: Medicaid Other | Source: Ambulatory Visit | Attending: Obstetrics and Gynecology | Admitting: Obstetrics and Gynecology

## 2016-06-21 DIAGNOSIS — R921 Mammographic calcification found on diagnostic imaging of breast: Secondary | ICD-10-CM | POA: Insufficient documentation

## 2016-06-21 DIAGNOSIS — R928 Other abnormal and inconclusive findings on diagnostic imaging of breast: Secondary | ICD-10-CM | POA: Diagnosis present

## 2016-06-27 ENCOUNTER — Ambulatory Visit (HOSPITAL_COMMUNITY)
Admission: RE | Admit: 2016-06-27 | Discharge: 2016-06-27 | Disposition: A | Discharge status: Home / Self Care | Payer: Medicaid Other | Source: Ambulatory Visit | Attending: Urology | Admitting: Urology

## 2016-06-27 DIAGNOSIS — N201 Calculus of ureter: Secondary | ICD-10-CM

## 2016-06-27 DIAGNOSIS — Z87442 Personal history of urinary calculi: Secondary | ICD-10-CM | POA: Diagnosis not present

## 2016-06-28 ENCOUNTER — Telehealth: Payer: Self-pay | Admitting: Internal Medicine

## 2016-06-28 NOTE — Telephone Encounter (Signed)
Pt was given dexilant samples after her EGD.

## 2016-06-28 NOTE — Telephone Encounter (Signed)
Pt called asking for [place] to send a prescription of Dexilant 60 mg to [facility]

## 2016-06-28 NOTE — Telephone Encounter (Signed)
Routing to the refill box. 

## 2016-06-28 NOTE — Telephone Encounter (Signed)
When I reviewed her medication list from her last visit it shows she's on Protonix. Is this not correct? Has she been on Dexilant before?

## 2016-06-29 ENCOUNTER — Ambulatory Visit (INDEPENDENT_AMBULATORY_CARE_PROVIDER_SITE_OTHER): Payer: Medicaid Other | Admitting: Urology

## 2016-06-29 DIAGNOSIS — N201 Calculus of ureter: Secondary | ICD-10-CM

## 2016-06-29 MED ORDER — DEXLANSOPRAZOLE 60 MG PO CPDR: 60.0000 mg | DELAYED_RELEASE_CAPSULE | Freq: Every day | ORAL | 3 refills | Status: DC

## 2016-06-29 NOTE — Telephone Encounter (Signed)
Noted. Rx sent to pharmacy per request.

## 2016-06-29 NOTE — Addendum Note (Signed)
Addended by: Gordy Levan, Budd Freiermuth A on: 06/29/2016 04:42 PM   Modules accepted: Orders

## 2016-08-03 ENCOUNTER — Ambulatory Visit: Payer: Medicaid Other | Admitting: Obstetrics and Gynecology

## 2016-08-05 ENCOUNTER — Encounter (INDEPENDENT_AMBULATORY_CARE_PROVIDER_SITE_OTHER): Payer: Self-pay

## 2016-08-05 ENCOUNTER — Encounter: Payer: Self-pay | Admitting: Obstetrics and Gynecology

## 2016-08-05 ENCOUNTER — Ambulatory Visit (INDEPENDENT_AMBULATORY_CARE_PROVIDER_SITE_OTHER): Payer: Medicaid Other | Admitting: Obstetrics and Gynecology

## 2016-08-05 VITALS — BP 120/80 | HR 80 | Wt 142.6 lb

## 2016-08-05 DIAGNOSIS — N898 Other specified noninflammatory disorders of vagina: Secondary | ICD-10-CM | POA: Diagnosis not present
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
## 2016-08-05 NOTE — Patient Instructions (Signed)
You may try Cornhuskers lotion, Gyne-Moistrin, Femm Ease and Luvena as a sexual lubricant. The most popular one recommended to me is the Cornhuskers. It is slippery without that sticky after use feeling.

## 2016-09-12 ENCOUNTER — Other Ambulatory Visit: Payer: Self-pay | Admitting: Nurse Practitioner

## 2016-09-13 ENCOUNTER — Telehealth: Payer: Self-pay | Admitting: Gastroenterology

## 2016-09-13 ENCOUNTER — Ambulatory Visit: Payer: Medicaid Other | Admitting: Gastroenterology

## 2016-09-13 ENCOUNTER — Encounter: Payer: Self-pay | Admitting: Gastroenterology

## 2016-09-13 NOTE — Telephone Encounter (Signed)
Patient was a no show and letter sent  °

## 2016-10-27 ENCOUNTER — Emergency Department (HOSPITAL_COMMUNITY)
Admission: EM | Admit: 2016-10-27 | Discharge: 2016-10-27 | Disposition: A | Discharge status: Home / Self Care | Payer: Medicaid Other | Attending: Emergency Medicine | Admitting: Emergency Medicine

## 2016-10-27 ENCOUNTER — Encounter (HOSPITAL_COMMUNITY): Payer: Self-pay | Admitting: Emergency Medicine

## 2016-10-27 DIAGNOSIS — Y999 Unspecified external cause status: Secondary | ICD-10-CM | POA: Diagnosis not present

## 2016-10-27 DIAGNOSIS — Z79899 Other long term (current) drug therapy: Secondary | ICD-10-CM | POA: Insufficient documentation

## 2016-10-27 DIAGNOSIS — Z23 Encounter for immunization: Secondary | ICD-10-CM | POA: Diagnosis not present

## 2016-10-27 DIAGNOSIS — Y939 Activity, unspecified: Secondary | ICD-10-CM | POA: Insufficient documentation

## 2016-10-27 DIAGNOSIS — S0185XA Open bite of other part of head, initial encounter: Secondary | ICD-10-CM

## 2016-10-27 DIAGNOSIS — F17210 Nicotine dependence, cigarettes, uncomplicated: Secondary | ICD-10-CM | POA: Diagnosis not present

## 2016-10-27 DIAGNOSIS — S0993XA Unspecified injury of face, initial encounter: Secondary | ICD-10-CM | POA: Diagnosis present

## 2016-10-27 DIAGNOSIS — Y929 Unspecified place or not applicable: Secondary | ICD-10-CM | POA: Diagnosis not present

## 2016-10-27 DIAGNOSIS — W540XXA Bitten by dog, initial encounter: Secondary | ICD-10-CM | POA: Diagnosis not present

## 2016-10-27 MED ORDER — TETANUS-DIPHTH-ACELL PERTUSSIS 5-2.5-18.5 LF-MCG/0.5 IM SUSP
0.5000 mL | Freq: Once | INTRAMUSCULAR | Status: AC
  Administered 2016-10-27: 0.5 mL via INTRAMUSCULAR
  Filled 2016-10-27: qty 0.5

## 2016-10-27 MED ORDER — AMOXICILLIN-POT CLAVULANATE 875-125 MG PO TABS: 1.0000 | ORAL_TABLET | Freq: Two times a day (BID) | ORAL | 0 refills | Status: DC

## 2016-10-27 NOTE — ED Triage Notes (Signed)
Pt states " my dog bite my lip" last tetanus was greater than 5 years. Dog has had its shots.
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
## 2016-10-27 NOTE — Discharge Instructions (Signed)
Cleaned the wound with mild soap and water. You may apply a small amount of Vaseline daily. Return here for any signs of infection

## 2016-10-27 NOTE — ED Notes (Signed)
Pt reports that Brandon Melnick has been in to evaluate.

## 2016-10-27 NOTE — ED Notes (Signed)
[employer] notified of animal bite.  Office to interview Pt.

## 2016-10-31 ENCOUNTER — Other Ambulatory Visit: Payer: Self-pay | Admitting: Obstetrics and Gynecology

## 2016-11-10 ENCOUNTER — Telehealth: Payer: Self-pay | Admitting: Obstetrics and Gynecology

## 2016-11-10 ENCOUNTER — Telehealth: Payer: Self-pay | Admitting: Gastroenterology

## 2016-11-10 ENCOUNTER — Ambulatory Visit: Payer: Medicaid Other | Admitting: Gastroenterology

## 2016-11-10 NOTE — Telephone Encounter (Signed)
Pt's appt was for 10am and she was aware of this. She arrived at 1018 and was told that she would have to be rescheduled. She said that she would call us back and left.

## 2016-11-15 ENCOUNTER — Telehealth: Payer: Self-pay | Admitting: Obstetrics and Gynecology

## 2016-11-15 ENCOUNTER — Other Ambulatory Visit: Payer: Self-pay | Admitting: Obstetrics and Gynecology

## 2016-11-15 DIAGNOSIS — R921 Mammographic calcification found on diagnostic imaging of breast: Secondary | ICD-10-CM

## 2016-11-15 NOTE — Telephone Encounter (Signed)
Left breast diag mammogram with magnification views ordered.

## 2016-11-15 NOTE — Progress Notes (Signed)
Left breast diagnostic mammogram wit Magnification views ordered. Pt aware.

## 2016-11-15 NOTE — Telephone Encounter (Signed)
Pt is requesting a 3D Mammogram at [facility]. Pt is requesting we call with any updates [phone]

## 2016-11-16 NOTE — Telephone Encounter (Signed)
Diagnostic mammo and breast US scheduled at [facility] 11/29/16 @ 2:50 pm. No lotion, powder, or deodorant. [place]

## 2016-11-17 NOTE — Telephone Encounter (Signed)
Spoke with pt giving her mammo and [place] date. Pt states the hospital changed appt to 9/25 at 2:00 due to it has to be at least 6 months from the last mammo. Pt was advised no lotion, powder or deodorant. Pt voiced understanding. [place]

## 2016-11-23 ENCOUNTER — Encounter: Payer: Self-pay | Admitting: Obstetrics and Gynecology

## 2016-11-23 ENCOUNTER — Ambulatory Visit: Payer: Medicaid Other | Admitting: Nurse Practitioner

## 2016-11-23 ENCOUNTER — Ambulatory Visit (INDEPENDENT_AMBULATORY_CARE_PROVIDER_SITE_OTHER): Payer: Medicaid Other | Admitting: Obstetrics and Gynecology

## 2016-11-23 VITALS — BP 112/66 | HR 85 | Ht 64.0 in | Wt 149.0 lb

## 2016-11-23 DIAGNOSIS — Z01419 Encounter for gynecological examination (general) (routine) without abnormal findings: Secondary | ICD-10-CM | POA: Diagnosis not present

## 2016-11-23 DIAGNOSIS — Z0000 Encounter for general adult medical examination without abnormal findings: Secondary | ICD-10-CM | POA: Diagnosis not present

## 2016-11-23 DIAGNOSIS — R1904 Left lower quadrant abdominal swelling, mass and lump: Secondary | ICD-10-CM
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
## 2016-11-29 ENCOUNTER — Encounter (HOSPITAL_COMMUNITY): Payer: Self-pay

## 2016-11-29 ENCOUNTER — Other Ambulatory Visit: Payer: Self-pay | Admitting: Obstetrics and Gynecology

## 2016-11-29 DIAGNOSIS — R1904 Left lower quadrant abdominal swelling, mass and lump: Secondary | ICD-10-CM

## 2016-11-30 ENCOUNTER — Other Ambulatory Visit: Payer: Medicaid Other

## 2016-12-01 ENCOUNTER — Other Ambulatory Visit: Payer: Medicaid Other

## 2016-12-01 ENCOUNTER — Other Ambulatory Visit: Payer: Self-pay | Admitting: Gastroenterology

## 2016-12-07 ENCOUNTER — Other Ambulatory Visit: Payer: Self-pay | Admitting: Urology

## 2016-12-07 DIAGNOSIS — N201 Calculus of ureter: Secondary | ICD-10-CM

## 2016-12-10 ENCOUNTER — Emergency Department (HOSPITAL_COMMUNITY): Payer: Medicaid Other

## 2016-12-10 ENCOUNTER — Encounter (HOSPITAL_COMMUNITY): Payer: Self-pay | Admitting: Emergency Medicine

## 2016-12-10 ENCOUNTER — Emergency Department (HOSPITAL_COMMUNITY)
Admission: EM | Admit: 2016-12-10 | Discharge: 2016-12-10 | Disposition: A | Discharge status: Home / Self Care | Payer: Medicaid Other | Attending: Emergency Medicine | Admitting: Emergency Medicine

## 2016-12-10 DIAGNOSIS — K5900 Constipation, unspecified: Secondary | ICD-10-CM

## 2016-12-10 DIAGNOSIS — F17210 Nicotine dependence, cigarettes, uncomplicated: Secondary | ICD-10-CM | POA: Diagnosis not present

## 2016-12-10 DIAGNOSIS — G8929 Other chronic pain: Secondary | ICD-10-CM | POA: Insufficient documentation

## 2016-12-10 DIAGNOSIS — Z79899 Other long term (current) drug therapy: Secondary | ICD-10-CM | POA: Diagnosis not present

## 2016-12-10 DIAGNOSIS — R2241 Localized swelling, mass and lump, right lower limb: Secondary | ICD-10-CM | POA: Diagnosis present

## 2016-12-10 DIAGNOSIS — L03115 Cellulitis of right lower limb: Secondary | ICD-10-CM | POA: Insufficient documentation

## 2016-12-10 MED ORDER — VANCOMYCIN HCL IN DEXTROSE 1-5 GM/200ML-% IV SOLN
1000.0000 mg | Freq: Once | INTRAVENOUS | Status: AC
  Administered 2016-12-10: 1000 mg via INTRAVENOUS
  Filled 2016-12-10: qty 200

## 2016-12-10 MED ORDER — SULFAMETHOXAZOLE-TRIMETHOPRIM 800-160 MG PO TABS: 1.0000 | ORAL_TABLET | Freq: Two times a day (BID) | ORAL | 0 refills | Status: AC

## 2016-12-10 NOTE — ED Triage Notes (Signed)
Pt here with bilateral lower extremity swelling. R. Ankle more swollen than L. Pt also has an abrasion to top of R foot where she fell. Pt also c/o abdominal swelling and states she feels like the top of her head is "tight".

## 2016-12-10 NOTE — Discharge Instructions (Signed)
X-ray of your abdomen shows lots of constipation. Increase fluid, fruit, fiber. Also recommend fleets enema, Miralax, magnesium citrate. You have an infection in your right lower leg. Prescription for antibiotic. X-ray of ankle showed no fracture. Keep your leg very clean. Return if worse.
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
## 2016-12-10 NOTE — Progress Notes (Signed)
Pharmacy Note:  Initial antibiotics for Vancomycin ordered by EDP for cellulitis.  CrCl cannot be calculated (Patient's most recent lab result is older than the maximum 21 days allowed.).   No Known Allergies  Vitals:   12/10/16 2052  BP: 116/74  Pulse: 84  Resp: 18  Temp: 98.1 F (36.7 C)  SpO2: 98%    Anti-infectives    Start     Dose/Rate Route Frequency Ordered Stop   12/10/16 2200  vancomycin (VANCOCIN) IVPB 1000 mg/200 mL premix     1,000 mg 200 mL/hr over 60 Minutes Intravenous  Once 12/10/16 2143        Plan: Initial doses of Vancomycin 1000mg  X 1 ordered to be given in ED. F/U admission orders for further dosing if therapy continued.  Ena Dawley, [facility] 12/10/2016 9:43 PM

## 2016-12-27 ENCOUNTER — Inpatient Hospital Stay (HOSPITAL_COMMUNITY): Admission: RE | Admit: 2016-12-27 | Payer: Self-pay | Source: Ambulatory Visit

## 2016-12-28 ENCOUNTER — Ambulatory Visit: Payer: Self-pay | Admitting: Urology

## 2017-01-03 ENCOUNTER — Encounter (HOSPITAL_COMMUNITY): Payer: Self-pay

## 2017-01-03 ENCOUNTER — Ambulatory Visit (HOSPITAL_COMMUNITY)
Admission: RE | Admit: 2017-01-03 | Discharge: 2017-01-03 | Disposition: A | Discharge status: Home / Self Care | Payer: Medicaid Other | Source: Ambulatory Visit | Attending: Obstetrics and Gynecology | Admitting: Obstetrics and Gynecology

## 2017-01-03 DIAGNOSIS — R921 Mammographic calcification found on diagnostic imaging of breast: Secondary | ICD-10-CM | POA: Diagnosis present

## 2017-01-03 DIAGNOSIS — R928 Other abnormal and inconclusive findings on diagnostic imaging of breast: Secondary | ICD-10-CM | POA: Diagnosis present

## 2017-01-18 ENCOUNTER — Ambulatory Visit: Payer: Medicaid Other | Admitting: Nurse Practitioner

## 2017-01-18 ENCOUNTER — Encounter: Payer: Self-pay | Admitting: Nurse Practitioner

## 2017-01-18 ENCOUNTER — Telehealth: Payer: Self-pay | Admitting: Nurse Practitioner

## 2017-01-18 NOTE — Telephone Encounter (Signed)
Patient was a no show and letter sent  °
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
## 2017-01-19 NOTE — Telephone Encounter (Signed)
Noted  

## 2017-01-20 ENCOUNTER — Other Ambulatory Visit: Payer: Self-pay | Admitting: Gastroenterology

## 2017-01-31 ENCOUNTER — Telehealth: Payer: Self-pay

## 2017-01-31 NOTE — Telephone Encounter (Signed)
Pt is aware of new OV. She is to come in tomorrow.

## 2017-01-31 NOTE — Telephone Encounter (Signed)
Pt called to see when her next appointment was. Pt missed her last appointment 01/18/17. Please schedule appt.

## 2017-02-01 ENCOUNTER — Other Ambulatory Visit: Payer: Self-pay | Admitting: *Deleted

## 2017-02-01 ENCOUNTER — Ambulatory Visit (INDEPENDENT_AMBULATORY_CARE_PROVIDER_SITE_OTHER): Payer: Medicaid Other | Admitting: Nurse Practitioner

## 2017-02-01 ENCOUNTER — Encounter: Payer: Self-pay | Admitting: Nurse Practitioner

## 2017-02-01 VITALS — BP 114/75 | HR 87 | Temp 97.0°F | Ht 64.0 in | Wt 152.8 lb

## 2017-02-01 DIAGNOSIS — K5903 Drug induced constipation: Secondary | ICD-10-CM | POA: Diagnosis not present

## 2017-02-01 DIAGNOSIS — R1310 Dysphagia, unspecified: Secondary | ICD-10-CM

## 2017-02-01 DIAGNOSIS — K219 Gastro-esophageal reflux disease without esophagitis: Secondary | ICD-10-CM

## 2017-02-01 DIAGNOSIS — T402X5A Adverse effect of other opioids, initial encounter: Secondary | ICD-10-CM | POA: Diagnosis not present

## 2017-02-01 NOTE — Assessment & Plan Note (Signed)
The patient notes persistent dysphagia with solid foods and pills. She has had multiple dilations in the past. Barium pill esophagram shows esophageal dysmotility. Speech therapy evaluation feels due to medication effect. Despite her dilation recently she is having persistent problems. At this point she would likely benefit from manometry and further workup at [facility]. We will make this referral. Return for follow-up in 6 months here after they have been allowed to evaluate her.

## 2017-02-01 NOTE — Progress Notes (Signed)
cc'ed to pcp °

## 2017-02-01 NOTE — Patient Instructions (Addendum)
1. Continue your current medications. 2. I am giving you information related to a soft diet to help with your swallowing problems until you can be seen at [facility]. 3. We will refer you to [facility] for further evaluation of your swallowing problems. 4. Return for follow-up. 6 months. 5. Call us if you have any questions or concerns.    Dysphagia Diet Level 3, Mechanically Advanced The dysphagia level 3 diet includes foods that are soft, moist, and can be chopped into 1-inch chunks. This diet is helpful for people with mild swallowing difficulties. It reduces the risk of food getting caught in the windpipe, trachea, or lungs. What do I need to know about this diet?  You may eat foods that are soft and moist.  If you were on the dysphagia level 1 or level 2 diets, you may eat any of the foods included on those lists.  Avoid foods that are dry, hard, sticky, chewy, coarse, and crunchy. Also avoid large cuts of food.  Take small bites. Each bite should contain 1 inch or less of food.  Thicken liquids if instructed by your health care provider. Follow your health care provider's instructions on how to do this and to what consistency.  See your dietitian or speech language pathologist regularly for help with your dietary changes. What foods can I eat? Grains Moist breads without nuts or seeds. Biscuits, muffins, pancakes, and waffles well-moistened with syrup, jelly, margarine, or butter. Smooth cereals with plenty of milk to moisten them. Moist bread stuffing. Moist rice. Vegetables All cooked, soft vegetables. Shredded lettuce. Tender fried potatoes. Fruits All canned and cooked fruits. Soft, peeled fresh fruits, such as peaches, nectarines, kiwis, cantaloupe, honeydew melon, and watermelon without seeds. Soft berries, such as strawberries. Meat and Other Protein Sources Moist ground or finely diced or sliced meats.  Solid, tender cuts of meat. Meatloaf. Hamburger with a bun. Sausage patty. Deli thin-sliced lunch meat. Chicken, egg, or tuna salad sandwich. Sloppy joe. Moist fish. Eggs prepared any way. Casseroles with small chunks of meats, ground meats, or tender meats. Dairy Cheese spreads without coarse large chunks. Shredded cheese. Cheese slices. Cottage cheese. Milk at the right texture. Smooth frappes. Yogurt without nuts or coconut. Ask your health care provider whether you can have frozen desserts (such as malts or milk shakes) and thin liquids. Sweets/Desserts Soft, smooth, moist desserts. Non-chewy, smooth candy. Jam. Jelly. Honey. Preserves. Ask your health care provider whether you can have frozen desserts. Fats and Oils Butter. Oils. Margarine. Mayonnaise. Gravy. Spreads. Other All seasonings and sweeteners. All sauces without large chunks. The items listed above may not be a complete list of recommended foods or beverages. Contact your dietitian for more options. What foods are not recommended? Grains Coarse or dry cereals. Dry breads. Toast. Crackers. Tough, crusty breads, such French bread and baguettes. Tough, crisp fried potatoes. Potato skins. Dry bread stuffing. Granola. Popcorn. Chips. Vegetables All raw vegetables except shredded lettuce. Cooked corn. Rubbery or stiff cooked vegetables. Stringy vegetables, such as celery. Fruits Hard fruits that are difficult to chew, such as apples or pears. Stringy, high-pulp fruits, such as pineapple, papaya, or mango. Fruits with tough skins, such as grapes. Coconut. All dried fruits. Fruit leather. Fruit roll-ups. Fruit snacks. Meat and Other Protein Sources Dry or tough meats or poultry. Dry fish. Fish with bones. Peanut butter. All nuts and seeds. Dairy Any with nuts, seeds, chocolate chips, dried fruit, coconut, or pineapple. Sweets/Desserts  Dry cakes. Chewy or dry cookies. Any with nuts, seeds, dry fruits, coconut, pineapple, or anything dry,  sticky, or hard. Chewy caramel. Licorice. Taffy-type candies. Ask your health care provider whether you can have frozen desserts. Fats and Oils Any with chunks, nuts, seeds, or pineapple. Olives. Angie Fava. Other Soups with tough or large chunks of meats, poultry, or vegetables. Corn or clam chowder. The items listed above may not be a complete list of foods and beverages to avoid. Contact your dietitian for more information. This information is not intended to replace advice given to you by your health care provider. Make sure you discuss any questions you have with your health care provider. Document Released: 03/21/2005 Document Revised: 08/27/2015 Document Reviewed: 03/04/2013 Elsevier Interactive Patient Education  [employer].

## 2017-02-01 NOTE — Assessment & Plan Note (Signed)
GERD seems to be well managed on Dexilant. She does have a "liquid coming up out of her lungs." She has seasonal allergies and smokes. Her PCP thinks this is due to COPD and chronic bronchitis. Based on her description it is thick, mucousy like material and more likely pulmonary then reflux. Recommend she continue her Dexilant at this time. Return for follow-up in 6 months.

## 2017-02-01 NOTE — Assessment & Plan Note (Signed)
The patient has constipation which is generally well managed if she takes Movantik as well as MiraLAX. She is on opioids which is a likely source of her constipation. Colonoscopy is up-to-date but incomplete prep and recommended 1 year repeat, she will be due in March of next year. Continue current medications. Return for follow-up in 6 months.
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
## 2017-02-02 ENCOUNTER — Ambulatory Visit (HOSPITAL_COMMUNITY)
Admission: RE | Admit: 2017-02-02 | Discharge: 2017-02-02 | Disposition: A | Discharge status: Home / Self Care | Payer: Medicaid Other | Source: Ambulatory Visit | Attending: Urology | Admitting: Urology

## 2017-02-02 ENCOUNTER — Ambulatory Visit (HOSPITAL_COMMUNITY): Payer: Medicaid Other

## 2017-02-02 DIAGNOSIS — N200 Calculus of kidney: Secondary | ICD-10-CM | POA: Diagnosis not present

## 2017-02-02 DIAGNOSIS — N201 Calculus of ureter: Secondary | ICD-10-CM | POA: Diagnosis present

## 2017-02-03 ENCOUNTER — Ambulatory Visit (HOSPITAL_COMMUNITY): Payer: Medicaid Other

## 2017-02-04 ENCOUNTER — Encounter (HOSPITAL_COMMUNITY): Payer: Self-pay | Admitting: Emergency Medicine

## 2017-02-04 ENCOUNTER — Emergency Department (HOSPITAL_COMMUNITY)
Admission: EM | Admit: 2017-02-04 | Discharge: 2017-02-04 | Disposition: A | Discharge status: Home / Self Care | Payer: Medicaid Other | Attending: Emergency Medicine | Admitting: Emergency Medicine

## 2017-02-04 DIAGNOSIS — F17210 Nicotine dependence, cigarettes, uncomplicated: Secondary | ICD-10-CM | POA: Insufficient documentation

## 2017-02-04 DIAGNOSIS — R222 Localized swelling, mass and lump, trunk: Secondary | ICD-10-CM | POA: Diagnosis present

## 2017-02-04 DIAGNOSIS — Z79899 Other long term (current) drug therapy: Secondary | ICD-10-CM | POA: Diagnosis not present

## 2017-02-04 DIAGNOSIS — L02214 Cutaneous abscess of groin: Secondary | ICD-10-CM | POA: Diagnosis not present

## 2017-02-04 DIAGNOSIS — Z872 Personal history of diseases of the skin and subcutaneous tissue: Secondary | ICD-10-CM | POA: Diagnosis not present

## 2017-02-04 MED ORDER — POVIDONE-IODINE 10 % EX SOLN
CUTANEOUS | Status: AC
  Filled 2017-02-04: qty 45

## 2017-02-04 MED ORDER — ACETAMINOPHEN 325 MG PO TABS
650.0000 mg | ORAL_TABLET | Freq: Once | ORAL | Status: AC
  Administered 2017-02-04: 650 mg via ORAL
  Filled 2017-02-04: qty 2

## 2017-02-04 MED ORDER — FLUCONAZOLE 150 MG PO TABS: 150.0000 mg | ORAL_TABLET | Freq: Every day | ORAL | 0 refills | Status: DC

## 2017-02-04 MED ORDER — LIDOCAINE HCL (PF) 1 % IJ SOLN
5.0000 mL | Freq: Once | INTRAMUSCULAR | Status: AC
  Administered 2017-02-04: 5 mL

## 2017-02-04 MED ORDER — LIDOCAINE HCL (PF) 1 % IJ SOLN
INTRAMUSCULAR | Status: AC
  Filled 2017-02-04: qty 10

## 2017-02-04 MED ORDER — IBUPROFEN 800 MG PO TABS
800.0000 mg | ORAL_TABLET | Freq: Once | ORAL | Status: AC
  Administered 2017-02-04: 800 mg via ORAL
  Filled 2017-02-04: qty 1

## 2017-02-04 MED ORDER — FLUCONAZOLE 100 MG PO TABS
200.0000 mg | ORAL_TABLET | Freq: Once | ORAL | Status: AC
  Administered 2017-02-04: 200 mg via ORAL
  Filled 2017-02-04: qty 2

## 2017-02-04 MED ORDER — LIDOCAINE HCL (PF) 1 % IJ SOLN
5.0000 mL | Freq: Once | INTRAMUSCULAR | Status: AC
  Administered 2017-02-04: 5 mL
  Filled 2017-02-04: qty 6

## 2017-02-04 MED ORDER — DOXYCYCLINE HYCLATE 100 MG PO CAPS: 100.0000 mg | ORAL_CAPSULE | Freq: Two times a day (BID) | ORAL | 0 refills | Status: DC

## 2017-02-04 MED ORDER — PROMETHAZINE HCL 12.5 MG PO TABS
12.5000 mg | ORAL_TABLET | Freq: Once | ORAL | Status: AC
  Administered 2017-02-04: 12.5 mg via ORAL
  Filled 2017-02-04: qty 1

## 2017-02-04 MED ORDER — DOXYCYCLINE HYCLATE 100 MG PO TABS
100.0000 mg | ORAL_TABLET | Freq: Once | ORAL | Status: AC
  Administered 2017-02-04: 100 mg via ORAL
  Filled 2017-02-04: qty 1

## 2017-02-04 NOTE — ED Triage Notes (Signed)
Pt reports abscess to right inguinal area.

## 2017-02-04 NOTE — Discharge Instructions (Signed)
Please use doxycycline 2 times daily with food.  Please soak in a tub of warm [place] salt water daily for 10-15 minutes until the wound is healed from the inside out.  Change the dressing daily.  A culture has been sent to the lab.  Someone from the flow managers office will call you if there are any abnormal results.

## 2017-02-04 NOTE — ED Notes (Signed)
Bulky dressing applied over I&D site.
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
## 2017-02-06 ENCOUNTER — Telehealth: Payer: Self-pay | Admitting: Internal Medicine

## 2017-02-06 LAB — AEROBIC CULTURE  (SUPERFICIAL SPECIMEN): CULTURE: NORMAL

## 2017-02-06 LAB — AEROBIC CULTURE W GRAM STAIN (SUPERFICIAL SPECIMEN)

## 2017-02-06 NOTE — Telephone Encounter (Signed)
Called spoke with pt. Made her aware [facility] stated they would call her in the next 7 days with an appt. Nothing further needed

## 2017-02-06 NOTE — Telephone Encounter (Signed)
Pt was seen on 10/31 and called today asking about a referral to [facility] regarding her dysphagia. Please call her at [phone]

## 2017-02-08 ENCOUNTER — Ambulatory Visit: Payer: Medicaid Other | Admitting: Urology

## 2017-02-08 DIAGNOSIS — N201 Calculus of ureter: Secondary | ICD-10-CM

## 2017-02-09 ENCOUNTER — Other Ambulatory Visit: Payer: Self-pay | Admitting: Urology

## 2017-02-16 ENCOUNTER — Telehealth: Payer: Self-pay | Admitting: Obstetrics and Gynecology

## 2017-02-16 NOTE — Telephone Encounter (Signed)
Patient states she was told at her annual exam she needed to f/u with an u/s to assess an abdominal mass in her left lower quadrant. She was to come the next week but was unable to and would now like to schedule. Will get schedule on U/S appt after Thanksgiving per patient request.

## 2017-02-22 NOTE — Patient Instructions (Signed)
Meredith [name]  02/22/2017     @PREFPERIOPPHARMACY @   Your procedure is scheduled on 03/06/2017.  Report to [facility] at 7:30 A.M.  Call this number if you have problems the morning of surgery:  [phone]   Remember:  Do not eat food or drink liquids after midnight.  Take these medicines the morning of surgery with A SIP OF WATER  Topamax, Lyrica, Oxycodone if needed, Movantik, Latuda, Synthroid, Dexilant  Flexeril, Thorazine, Celebrex, Buspar, Adderall, Xanax, Albuterol inhaler and nebulizer.   Do not wear jewelry, make-up or nail polish.  Do not wear lotions, powders, or perfumes, or deoderant.  Do not shave 48 hours prior to surgery.  Men may shave face and neck.  Do not bring valuables to the hospital.  [facility] is not responsible for any belongings or valuables.  Contacts, dentures or bridgework may not be worn into surgery.  Leave your suitcase in the car.  After surgery it may be brought to your room.  For patients admitted to the hospital, discharge time will be determined by your treatment team.  Patients discharged the day of surgery will not be allowed to drive home.    Please read over the following fact sheets that you were given. Anesthesia Post-op Instructions     PATIENT INSTRUCTIONS POST-ANESTHESIA  IMMEDIATELY FOLLOWING SURGERY:  Do not drive or operate machinery for the first twenty four hours after surgery.  Do not make any important decisions for twenty four hours after surgery or while taking narcotic pain medications or sedatives.  If you develop intractable nausea and vomiting or a severe headache please notify your doctor immediately.  FOLLOW-UP:  Please make an appointment with your surgeon as instructed. You do not need to follow up with anesthesia unless specifically instructed to do so.  WOUND CARE INSTRUCTIONS (if applicable):  Keep a dry clean dressing on the anesthesia/puncture wound site if there is drainage.  Once the wound has quit  draining you may leave it open to air.  Generally you should leave the bandage intact for twenty four hours unless there is drainage.  If the epidural site drains for more than 36-48 hours please call the anesthesia department.  QUESTIONS?:  Please feel free to call your physician or the hospital operator if you have any questions, and they will be happy to assist you.      Ureteral Stent Implantation Ureteral stent implantation is a procedure to insert (implant) a flexible, soft, plastic tube (stent) into a tube (ureter) that drains urine from the kidneys. The stent supports the ureter while it heals and helps to drain urine from the kidneys. You may have a ureteral stent implanted after having a procedure to remove a blockage from the ureter (ureterolysis or pyeloplasty).You may also have a stent implanted to open the flow of urine when you have a blockage caused by a kidney stone, tumor, blood clot, or infection. You have two ureters, one on each side of the body. The ureters connect the kidneys to the organ that holds urine until it passes out of the body (bladder). The stent is placed so that one end is in the kidney, and one end is in the bladder. The stent is usually taken out after your ureter has healed. Depending on your condition, you may have a stent for just a few weeks, or you may have a long-term stent that will need to be replaced every few months. Tell a health care provider about:  Any  allergies you have.  All medicines you are taking, including vitamins, herbs, eye drops, creams, and over-the-counter medicines.  Any problems you or family members have had with anesthetic medicines.  Any blood disorders you have.  Any surgeries you have had.  Any medical conditions you have.  Whether you are pregnant or may be pregnant. What are the risks? Generally, this is a safe procedure. However, problems may occur, including:  Infection.  Bleeding.  Allergic reactions to  medicines.  Damage to other structures or organs. Tearing (perforation) of the ureter is possible.  Movement of the stent away from where it is placed during surgery (migration).  What happens before the procedure?  Ask your health care provider about: ? Changing or stopping your regular medicines. This is especially important if you are taking diabetes medicines or blood thinners. ? Taking medicines such as aspirin and ibuprofen. These medicines can thin your blood. Do not take these medicines before your procedure if your health care provider instructs you not to.  Follow instructions from your health care provider about eating or drinking restrictions.  Do not drink alcohol and do not use any tobacco products before your procedure, as told by your health care provider.  You may be given antibiotic medicine to help prevent infection.  Plan to have someone take you home after the procedure.  If you go home right after the procedure, plan to have someone with you for 24 hours. What happens during the procedure?  An IV tube will be inserted into one of your veins.  You will be given a medicine to make you fall asleep (general anesthetic). You may also be given a medicine to help you relax (sedative).  A thin, tube-shaped instrument with a light and tiny camera at the end (cystoscope) will be inserted into your urethra. The urethra is the tube that drains urine from the bladder out of the body. In men, the urethra opens at the end of the penis. In women, the urethra opens in front of the vaginal opening.  The cystoscope will be passed into your bladder.  A thin wire (guide wire) will be passed through your bladder and into your ureter. This is used to guide the stent into your ureter.  The stent will be inserted into your ureter.  The guide wire and the cystoscope will be removed.  A flexible tube (catheter) will be inserted through your urethra so that one end is in your bladder.  This helps to drain urine from your bladder. The procedure may vary among hospitals and health care providers. What happens after the procedure?  Your blood pressure, heart rate, breathing rate, and blood oxygen level will be monitored often until the medicines you were given have worn off.  You may continue to receive medicine and fluids through an IV tube.  You may have some soreness or pain in your abdomen and urethra. Medicines will be available to help you.  You will be encouraged to get up and walk around as soon as you can.  You will have a catheter draining your urine.  You will have some blood in your urine.  Do not drive for 24 hours if you received a sedative. This information is not intended to replace advice given to you by your health care provider. Make sure you discuss any questions you have with your health care provider. Document Released: 03/18/2000 Document Revised: 08/27/2015 Document Reviewed: 10/03/2014 Elsevier Interactive Patient Education  2018 [employer]. Cystoscopy Cystoscopy is  a procedure that is used to help diagnose and sometimes treat conditions that affect that lower urinary tract. The lower urinary tract includes the bladder and the tube that drains urine from the bladder out of the body (urethra). Cystoscopy is performed with a thin, tube-shaped instrument with a light and camera at the end (cystoscope). The cystoscope may be hard (rigid) or flexible, depending on the goal of the procedure.The cystoscope is inserted through the urethra, into the bladder. Cystoscopy may be recommended if you have:  Urinary tractinfections that keep coming back (recurring).  Blood in the urine (hematuria).  Loss of bladder control (urinary incontinence) or an overactive bladder.  Unusual cells found in a urine sample.  A blockage in the urethra.  Painful urination.  An abnormality in the bladder found during an intravenous pyelogram (IVP) or CT  scan.  Cystoscopy may also be done to remove a sample of tissue to be examined under a microscope (biopsy). Tell a health care provider about:  Any allergies you have.  All medicines you are taking, including vitamins, herbs, eye drops, creams, and over-the-counter medicines.  Any problems you or family members have had with anesthetic medicines.  Any blood disorders you have.  Any surgeries you have had.  Any medical conditions you have.  Whether you are pregnant or may be pregnant. What are the risks? Generally, this is a safe procedure. However, problems may occur, including:  Infection.  Bleeding.  Allergic reactions to medicines.  Damage to other structures or organs.  What happens before the procedure?  Ask your health care provider about: ? Changing or stopping your regular medicines. This is especially important if you are taking diabetes medicines or blood thinners. ? Taking medicines such as aspirin and ibuprofen. These medicines can thin your blood. Do not take these medicines before your procedure if your health care provider instructs you not to.  Follow instructions from your health care provider about eating or drinking restrictions.  You may be given antibiotic medicine to help prevent infection.  You may have an exam or testing, such as X-rays of the bladder, urethra, or kidneys.  You may have urine tests to check for signs of infection.  Plan to have someone take you home after the procedure. What happens during the procedure?  To reduce your risk of infection,your health care team will wash or sanitize their hands.  You will be given one or more of the following: ? A medicine to help you relax (sedative). ? A medicine to numb the area (local anesthetic).  The area around the opening of your urethra will be cleaned.  The cystoscope will be passed through your urethra into your bladder.  Germ-free (sterile)fluid will flow through the  cystoscope to fill your bladder. The fluid will stretch your bladder so that your surgeon can clearly examine your bladder walls.  The cystoscope will be removed and your bladder will be emptied. The procedure may vary among health care providers and hospitals. What happens after the procedure?  You may have some soreness or pain in your abdomen and urethra. Medicines will be available to help you.  You may have some blood in your urine.  Do not drive for 24 hours if you received a sedative. This information is not intended to replace advice given to you by your health care provider. Make sure you discuss any questions you have with your health care provider. Document Released: 03/18/2000 Document Revised: 07/30/2015 Document Reviewed: 02/05/2015 Elsevier Interactive Patient Education  2017 [employer]  

## 2017-02-28 ENCOUNTER — Other Ambulatory Visit: Payer: Self-pay

## 2017-02-28 ENCOUNTER — Encounter (HOSPITAL_COMMUNITY): Payer: Self-pay

## 2017-02-28 ENCOUNTER — Encounter (HOSPITAL_COMMUNITY)
Admission: RE | Admit: 2017-02-28 | Discharge: 2017-02-28 | Disposition: A | Discharge status: Home / Self Care | Payer: Medicaid Other | Source: Ambulatory Visit | Attending: Urology | Admitting: Urology

## 2017-02-28 DIAGNOSIS — Z01812 Encounter for preprocedural laboratory examination: Secondary | ICD-10-CM | POA: Insufficient documentation

## 2017-02-28 HISTORY — DX: Unspecified asthma, uncomplicated: J45.909

## 2017-02-28 HISTORY — DX: Hypothyroidism, unspecified: E03.9

## 2017-02-28 HISTORY — DX: Chronic obstructive pulmonary disease, unspecified: J44.9

## 2017-02-28 HISTORY — DX: Personal history of urinary calculi: Z87.442

## 2017-02-28 HISTORY — DX: Malignant (primary) neoplasm, unspecified: C80.1

## 2017-02-28 LAB — CBC WITH DIFFERENTIAL/PLATELET
BASOS PCT: 0 %
Basophils Absolute: 0 10*3/uL (ref 0.0–0.1)
EOS ABS: 0 10*3/uL (ref 0.0–0.7)
EOS PCT: 1 %
HCT: 43.5 % (ref 36.0–46.0)
Hemoglobin: 13.9 g/dL (ref 12.0–15.0)
Lymphocytes Relative: 33 %
Lymphs Abs: 2.2 10*3/uL (ref 0.7–4.0)
MCH: 31.2 pg (ref 26.0–34.0)
MCHC: 32 g/dL (ref 30.0–36.0)
MCV: 97.8 fL (ref 78.0–100.0)
MONO ABS: 0.6 10*3/uL (ref 0.1–1.0)
MONOS PCT: 9 %
Neutro Abs: 3.8 10*3/uL (ref 1.7–7.7)
Neutrophils Relative %: 57 %
Platelets: 279 10*3/uL (ref 150–400)
RBC: 4.45 MIL/uL (ref 3.87–5.11)
RDW: 16.1 % — AB (ref 11.5–15.5)
WBC: 6.6 10*3/uL (ref 4.0–10.5)

## 2017-02-28 LAB — BASIC METABOLIC PANEL
Anion gap: 7 (ref 5–15)
BUN: 19 mg/dL (ref 6–20)
CALCIUM: 9.1 mg/dL (ref 8.9–10.3)
CO2: 22 mmol/L (ref 22–32)
CREATININE: 0.77 mg/dL (ref 0.44–1.00)
Chloride: 107 mmol/L (ref 101–111)
GFR calc non Af Amer: >60 mL/min (ref >60)
Glucose, Bld: 93 mg/dL (ref 65–99)
Potassium: 3.8 mmol/L (ref 3.5–5.1)
SODIUM: 136 mmol/L (ref 135–145)

## 2017-02-28 NOTE — Telephone Encounter (Signed)
Called [facility] to f/u on referral. Appt scheduled for 06/12/17 at 2:30pm with Dr. Dorthea Cove. Pt will be receiving packet in the mail. Appt letter mailed to pt.

## 2017-03-01 ENCOUNTER — Ambulatory Visit (INDEPENDENT_AMBULATORY_CARE_PROVIDER_SITE_OTHER): Payer: Medicaid Other

## 2017-03-01 DIAGNOSIS — R1904 Left lower quadrant abdominal swelling, mass and lump: Secondary | ICD-10-CM

## 2017-03-01 NOTE — Progress Notes (Signed)
PELVIC US TA/TV: normal vaginal cuff,normal ovaries bilat,ovaries appear mobile,no pain during ultrasound,no free fluid

## 2017-03-06 ENCOUNTER — Encounter (HOSPITAL_COMMUNITY)
Admission: RE | Disposition: A | Discharge status: Home / Self Care | Payer: Self-pay | Source: Ambulatory Visit | Attending: Urology

## 2017-03-06 ENCOUNTER — Ambulatory Visit (HOSPITAL_COMMUNITY): Payer: Medicaid Other | Admitting: Anesthesiology

## 2017-03-06 ENCOUNTER — Encounter (HOSPITAL_COMMUNITY): Payer: Self-pay

## 2017-03-06 ENCOUNTER — Ambulatory Visit (HOSPITAL_COMMUNITY): Payer: Medicaid Other

## 2017-03-06 ENCOUNTER — Ambulatory Visit (HOSPITAL_COMMUNITY)
Admission: RE | Admit: 2017-03-06 | Discharge: 2017-03-06 | Disposition: A | Discharge status: Home / Self Care | Payer: Medicaid Other | Source: Ambulatory Visit | Attending: Urology | Admitting: Urology

## 2017-03-06 DIAGNOSIS — N200 Calculus of kidney: Secondary | ICD-10-CM | POA: Diagnosis present

## 2017-03-06 DIAGNOSIS — Z79899 Other long term (current) drug therapy: Secondary | ICD-10-CM | POA: Diagnosis not present

## 2017-03-06 DIAGNOSIS — Z90710 Acquired absence of both cervix and uterus: Secondary | ICD-10-CM | POA: Diagnosis not present

## 2017-03-06 DIAGNOSIS — Z8249 Family history of ischemic heart disease and other diseases of the circulatory system: Secondary | ICD-10-CM | POA: Diagnosis not present

## 2017-03-06 DIAGNOSIS — K219 Gastro-esophageal reflux disease without esophagitis: Secondary | ICD-10-CM | POA: Diagnosis not present

## 2017-03-06 DIAGNOSIS — F17210 Nicotine dependence, cigarettes, uncomplicated: Secondary | ICD-10-CM | POA: Insufficient documentation

## 2017-03-06 DIAGNOSIS — M1990 Unspecified osteoarthritis, unspecified site: Secondary | ICD-10-CM | POA: Diagnosis not present

## 2017-03-06 DIAGNOSIS — Z801 Family history of malignant neoplasm of trachea, bronchus and lung: Secondary | ICD-10-CM | POA: Diagnosis not present

## 2017-03-06 DIAGNOSIS — M797 Fibromyalgia: Secondary | ICD-10-CM | POA: Insufficient documentation

## 2017-03-06 DIAGNOSIS — F319 Bipolar disorder, unspecified: Secondary | ICD-10-CM | POA: Insufficient documentation

## 2017-03-06 DIAGNOSIS — R002 Palpitations: Secondary | ICD-10-CM | POA: Diagnosis not present

## 2017-03-06 DIAGNOSIS — F419 Anxiety disorder, unspecified: Secondary | ICD-10-CM | POA: Diagnosis not present

## 2017-03-06 DIAGNOSIS — Z87442 Personal history of urinary calculi: Secondary | ICD-10-CM | POA: Insufficient documentation

## 2017-03-06 DIAGNOSIS — Z8261 Family history of arthritis: Secondary | ICD-10-CM | POA: Diagnosis not present

## 2017-03-06 DIAGNOSIS — J45909 Unspecified asthma, uncomplicated: Secondary | ICD-10-CM | POA: Insufficient documentation

## 2017-03-06 DIAGNOSIS — Z823 Family history of stroke: Secondary | ICD-10-CM | POA: Insufficient documentation

## 2017-03-06 DIAGNOSIS — R5382 Chronic fatigue, unspecified: Secondary | ICD-10-CM | POA: Diagnosis not present

## 2017-03-06 DIAGNOSIS — Z7951 Long term (current) use of inhaled steroids: Secondary | ICD-10-CM | POA: Insufficient documentation

## 2017-03-06 DIAGNOSIS — Z825 Family history of asthma and other chronic lower respiratory diseases: Secondary | ICD-10-CM | POA: Insufficient documentation

## 2017-03-06 DIAGNOSIS — Z8269 Family history of other diseases of the musculoskeletal system and connective tissue: Secondary | ICD-10-CM | POA: Insufficient documentation

## 2017-03-06 DIAGNOSIS — M549 Dorsalgia, unspecified: Secondary | ICD-10-CM | POA: Insufficient documentation

## 2017-03-06 DIAGNOSIS — G8929 Other chronic pain: Secondary | ICD-10-CM | POA: Insufficient documentation

## 2017-03-06 DIAGNOSIS — E039 Hypothyroidism, unspecified: Secondary | ICD-10-CM | POA: Diagnosis not present

## 2017-03-06 DIAGNOSIS — Z915 Personal history of self-harm: Secondary | ICD-10-CM | POA: Insufficient documentation

## 2017-03-06 DIAGNOSIS — F209 Schizophrenia, unspecified: Secondary | ICD-10-CM | POA: Insufficient documentation

## 2017-03-06 DIAGNOSIS — F29 Unspecified psychosis not due to a substance or known physiological condition: Secondary | ICD-10-CM | POA: Insufficient documentation

## 2017-03-06 DIAGNOSIS — Z85828 Personal history of other malignant neoplasm of skin: Secondary | ICD-10-CM | POA: Diagnosis not present

## 2017-03-06 HISTORY — PX: CYSTOSCOPY WITH RETROGRADE PYELOGRAM, URETEROSCOPY AND STENT PLACEMENT: SHX5789

## 2017-03-06 HISTORY — PX: STONE EXTRACTION WITH BASKET: SHX5318

## 2017-03-06 SURGERY — CYSTOURETEROSCOPY, WITH RETROGRADE PYELOGRAM AND STENT INSERTION
Anesthesia: General | Site: Ureter | Laterality: Right

## 2017-03-06 MED ORDER — ONDANSETRON 4 MG PO TBDP
ORAL_TABLET | ORAL | Status: AC
  Filled 2017-03-06: qty 1

## 2017-03-06 MED ORDER — PROPOFOL 10 MG/ML IV BOLUS
INTRAVENOUS | Status: DC | PRN
  Administered 2017-03-06: 150 mg via INTRAVENOUS
  Administered 2017-03-06: 20 mg via INTRAVENOUS

## 2017-03-06 MED ORDER — OXYCODONE HCL 10 MG PO TABS: 10.0000 mg | ORAL_TABLET | ORAL | 0 refills | Status: DC | PRN

## 2017-03-06 MED ORDER — SODIUM CHLORIDE 0.9 % IJ SOLN
INTRAMUSCULAR | Status: AC
Start: 2017-03-06 — End: 2017-03-06
  Filled 2017-03-06: qty 10

## 2017-03-06 MED ORDER — MIDAZOLAM HCL 2 MG/2ML IJ SOLN
INTRAMUSCULAR | Status: AC
  Filled 2017-03-06: qty 2

## 2017-03-06 MED ORDER — MIDAZOLAM HCL 2 MG/2ML IJ SOLN
1.0000 mg | Freq: Once | INTRAMUSCULAR | Status: AC | PRN
  Administered 2017-03-06: 2 mg via INTRAVENOUS

## 2017-03-06 MED ORDER — ALBUTEROL SULFATE HFA 108 (90 BASE) MCG/ACT IN AERS
2.0000 | INHALATION_SPRAY | RESPIRATORY_TRACT | Status: DC
Start: 2017-03-06 — End: 2017-03-06
  Filled 2017-03-06: qty 6.7

## 2017-03-06 MED ORDER — IPRATROPIUM-ALBUTEROL 0.5-2.5 (3) MG/3ML IN SOLN
RESPIRATORY_TRACT | Status: AC
  Filled 2017-03-06: qty 3

## 2017-03-06 MED ORDER — EPHEDRINE SULFATE 50 MG/ML IJ SOLN
INTRAMUSCULAR | Status: AC
  Filled 2017-03-06: qty 1

## 2017-03-06 MED ORDER — SODIUM CHLORIDE 0.9 % IR SOLN
Status: DC | PRN
Start: 2017-03-06 — End: 2017-03-06
  Administered 2017-03-06: 3000 mL via INTRAVESICAL
  Administered 2017-03-06: 3000 mL

## 2017-03-06 MED ORDER — OXYCODONE HCL 5 MG/5ML PO SOLN: 5.0000 mg | Freq: Once | ORAL | Status: AC | PRN

## 2017-03-06 MED ORDER — SULFAMETHOXAZOLE-TRIMETHOPRIM 800-160 MG PO TABS: 1.0000 | ORAL_TABLET | Freq: Two times a day (BID) | ORAL | 0 refills | Status: DC

## 2017-03-06 MED ORDER — OXYCODONE HCL 5 MG PO TABS
5.0000 mg | ORAL_TABLET | Freq: Once | ORAL | Status: AC | PRN
  Administered 2017-03-06: 5 mg via ORAL
  Filled 2017-03-06: qty 1

## 2017-03-06 MED ORDER — FENTANYL CITRATE (PF) 100 MCG/2ML IJ SOLN
INTRAMUSCULAR | Status: AC
  Filled 2017-03-06: qty 2

## 2017-03-06 MED ORDER — FENTANYL CITRATE (PF) 100 MCG/2ML IJ SOLN
INTRAMUSCULAR | Status: DC | PRN
  Administered 2017-03-06 (×2): 50 ug via INTRAVENOUS

## 2017-03-06 MED ORDER — WATER FOR IRRIGATION, STERILE IR SOLN
Status: DC | PRN
  Administered 2017-03-06: 1000 mL via SURGICAL_CAVITY

## 2017-03-06 MED ORDER — IPRATROPIUM-ALBUTEROL 0.5-2.5 (3) MG/3ML IN SOLN
3.0000 mL | Freq: Four times a day (QID) | RESPIRATORY_TRACT | Status: DC
  Administered 2017-03-06: 3 mL via RESPIRATORY_TRACT

## 2017-03-06 MED ORDER — LIDOCAINE HCL (CARDIAC) 10 MG/ML IV SOLN
INTRAVENOUS | Status: DC | PRN
  Administered 2017-03-06: 40 mg via INTRAVENOUS

## 2017-03-06 MED ORDER — ALBUTEROL SULFATE HFA 108 (90 BASE) MCG/ACT IN AERS: 2.0000 | INHALATION_SPRAY | RESPIRATORY_TRACT | Status: DC

## 2017-03-06 MED ORDER — ONDANSETRON 4 MG PO TBDP
4.0000 mg | ORAL_TABLET | Freq: Once | ORAL | Status: AC
  Administered 2017-03-06: 4 mg via ORAL

## 2017-03-06 MED ORDER — CEFAZOLIN SODIUM-DEXTROSE 2-4 GM/100ML-% IV SOLN
2.0000 g | INTRAVENOUS | Status: AC
  Administered 2017-03-06: 2 g via INTRAVENOUS

## 2017-03-06 MED ORDER — LACTATED RINGERS IV SOLN
INTRAVENOUS | Status: DC
  Administered 2017-03-06 (×2): via INTRAVENOUS

## 2017-03-06 MED ORDER — PROPOFOL 10 MG/ML IV BOLUS
INTRAVENOUS | Status: AC
Start: 2017-03-06 — End: 2017-03-06
  Filled 2017-03-06: qty 20

## 2017-03-06 MED ORDER — CEFAZOLIN SODIUM-DEXTROSE 2-4 GM/100ML-% IV SOLN
INTRAVENOUS | Status: AC
  Filled 2017-03-06: qty 100

## 2017-03-06 MED ORDER — FENTANYL CITRATE (PF) 100 MCG/2ML IJ SOLN: 25.0000 ug | INTRAMUSCULAR | Status: DC | PRN

## 2017-03-06 SURGICAL SUPPLY — 27 items
BAG DRAIN URO TABLE W/ADPT NS (DRAPE) ×3 IMPLANT
BAG DRN 8 ADPR NS SKTRN CSTL (DRAPE) ×2
BAG HAMPER (MISCELLANEOUS) ×3 IMPLANT
CATH INTERMIT  6FR 70CM (CATHETERS) ×3 IMPLANT
CLOTH BEACON ORANGE TIMEOUT ST (SAFETY) ×3 IMPLANT
DECANTER SPIKE VIAL GLASS SM (MISCELLANEOUS) ×3 IMPLANT
EXTRACTOR STONE NITINOL NGAGE (UROLOGICAL SUPPLIES) ×3 IMPLANT
FIBER LASER FLEXIVA 200 (UROLOGICAL SUPPLIES) ×3 IMPLANT
GLOVE BIO SURGEON STRL SZ8 (GLOVE) ×3 IMPLANT
GLOVE BIOGEL PI IND STRL 7.0 (GLOVE) ×2 IMPLANT
GLOVE BIOGEL PI INDICATOR 7.0 (GLOVE) ×3
GLOVE ECLIPSE 6.5 STRL STRAW (GLOVE) ×1 IMPLANT
GOWN STRL REUS W/ TWL XL LVL3 (GOWN DISPOSABLE) ×2 IMPLANT
GOWN STRL REUS W/TWL LRG LVL3 (GOWN DISPOSABLE) ×3 IMPLANT
GOWN STRL REUS W/TWL XL LVL3 (GOWN DISPOSABLE) ×3
GUIDEWIRE STR DUAL SENSOR (WIRE) ×3 IMPLANT
GUIDEWIRE STR ZIPWIRE 035X150 (MISCELLANEOUS) ×3 IMPLANT
IV NS IRRIG 3000ML ARTHROMATIC (IV SOLUTION) ×6 IMPLANT
KIT ROOM TURNOVER AP CYSTO (KITS) ×3 IMPLANT
MANIFOLD NEPTUNE II (INSTRUMENTS) ×3 IMPLANT
PACK CYSTO (CUSTOM PROCEDURE TRAY) ×3 IMPLANT
PAD ARMBOARD 7.5X6 YLW CONV (MISCELLANEOUS) ×3 IMPLANT
SHEATH ACCESS URETERAL 38CM (SHEATH) ×1 IMPLANT
STENT URET 6FRX26 CONTOUR (STENTS) ×1 IMPLANT
SYRINGE 10CC LL (SYRINGE) ×3 IMPLANT
TOWEL OR 17X26 4PK STRL BLUE (TOWEL DISPOSABLE) ×3 IMPLANT
WATER STERILE IRR 500ML POUR (IV SOLUTION) ×3 IMPLANT

## 2017-03-06 NOTE — Op Note (Signed)
.  Preoperative diagnosis: right renal calculi  Postoperative diagnosis: Same  Procedure: 1 cystoscopy 2. Right retrograde pyelography 3.  Intraoperative fluoroscopy, under one hour, with interpretation 4.  right ureteroscopic stone manipulation with basket extraction 5.  Right 6 x 26 JJ stent placement  Attending: Rosie Fate  Anesthesia: General  Estimated blood loss: None  Drains: right 6 x 26 JJ ureteral stent with tether  Specimens: stone for analysis  Antibiotics: ancef  Findings: right mid and lower pole renal calculi. No hydronephrosis. No masses/lesions in the bladder. Ureteral orifices in normal anatomic location.  Indications: Patient is a 51 year old female with a history of right renal calculi which have been growing in size. After discussing treatment options, they decided proceed with right ureteroscopic stone manipulation.  Procedure her in detail: The patient was brought to the operating room and a brief timeout was done to ensure correct patient, correct procedure, correct site.  General anesthesia was administered patient was placed in dorsal lithotomy position.  Her genitalia was then prepped and draped in usual sterile fashion.  A rigid 60 French cystoscope was passed in the urethra and the bladder.  Bladder was inspected free masses or lesions.  the ureteral orifices were in the normal orthotopic locations. a 6 french ureteral catheter was then instilled into the right ureteral orifice.  a gentle retrograde was obtained and findings noted above. We then advanced a zipwire through the catheter and up to the renal pelvis.  we then removed the cystoscope and cannulated the left ureteral orifice with a semirigid ureteroscope.  We located no stone in the ureter. We then placed a sensor wire up to the renal pelvis. We removed the scope and advanced a 12/14 x 38cm access sheath up to the renal pelvis. We then used the flexible ureteroscope to perform nephroscopy. We  located calculi in the mid and lower poles which were removed with an NGage basket. Once the stone were removed we then removed the access sheath under direct vision and noted to injury to the ureter.  We then placed a 6 x 26 double-j ureteral stent over the original zip wire. We then removed the wire and good coil was noted in the the renal pelvis under fluoroscopy and the bladder under direct vision.   the bladder was then drained and this concluded the procedure which was well tolerated by patient.  Complications: None  Condition: Stable, extubated, transferred to PACU  Plan: Patient is to be discharged home as to follow-up in 2 weeks. She is to remove her stent by pulling the tether in 72 hours

## 2017-03-06 NOTE — Discharge Instructions (Signed)
Ureteral Stent Implantation, Care After Refer to this sheet in the next few weeks. These instructions provide you with information about caring for yourself after your procedure. Your health care provider may also give you more specific instructions. Your treatment has been planned according to current medical practices, but problems sometimes occur. Call your health care provider if you have any problems or questions after your procedure. What can I expect after the procedure? After the procedure, it is common to have:  Nausea.  Mild pain when you urinate. You may feel this pain in your lower back or lower abdomen. Pain should stop within a few minutes after you urinate. This may last for up to 1 week.  A small amount of blood in your urine for several days.  Follow these instructions at home:  Medicines  Take over-the-counter and prescription medicines only as told by your health care provider.  If you were prescribed an antibiotic medicine, take it as told by your health care provider. Do not stop taking the antibiotic even if you start to feel better.  Do not drive for 24 hours if you received a sedative.  Do not drive or operate heavy machinery while taking prescription pain medicines. Activity  Return to your normal activities as told by your health care provider. Ask your health care provider what activities are safe for you.  Do not lift anything that is heavier than 10 lb (4.5 kg). Follow this limit for 1 week after your procedure, or for as long as told by your health care provider. General instructions  Watch for any blood in your urine. Call your health care provider if the amount of blood in your urine increases.  If you have a catheter: ? Follow instructions from your health care provider about taking care of your catheter and collection bag. ? Do not take baths, swim, or use a hot tub until your health care provider approves.  Drink enough fluid to keep your urine  clear or pale yellow.  Keep all follow-up visits as told by your health care provider. This is important. Contact a health care provider if:  You have pain that gets worse or does not get better with medicine, especially pain when you urinate.  You have difficulty urinating.  You feel nauseous or you vomit repeatedly during a period of more than 2 days after the procedure. Get help right away if:  Your urine is dark red or has blood clots in it.  You are leaking urine (have incontinence).  The end of the stent comes out of your urethra.  You cannot urinate.  You have sudden, sharp, or severe pain in your abdomen or lower back.  You have a fever. This information is not intended to replace advice given to you by your health care provider. Make sure you discuss any questions you have with your health care provider. Document Released: 11/21/2012 Document Revised: 08/27/2015 Document Reviewed: 10/03/2014 Elsevier Interactive Patient Education  2018 [place] STENT BY GENTLY PULLING THE STRING IN 72 HOURS     PATIENT INSTRUCTIONS POST-ANESTHESIA  IMMEDIATELY FOLLOWING SURGERY:  Do not drive or operate machinery for the first twenty four hours after surgery.  Do not make any important decisions for twenty four hours after surgery or while taking narcotic pain medications or sedatives.  If you develop intractable nausea and vomiting or a severe headache please notify your doctor immediately.  FOLLOW-UP:  Please make an appointment with your surgeon as  instructed. You do not need to follow up with anesthesia unless specifically instructed to do so.  WOUND CARE INSTRUCTIONS (if applicable):  Keep a dry clean dressing on the anesthesia/puncture wound site if there is drainage.  Once the wound has quit draining you may leave it open to air.  Generally you should leave the bandage intact for twenty four hours unless there is drainage.  If the epidural site drains  for more than 36-48 hours please call the anesthesia department.  QUESTIONS?:  Please feel free to call your physician or the hospital operator if you have any questions, and they will be happy to assist you.

## 2017-03-06 NOTE — Anesthesia Preprocedure Evaluation (Signed)
Anesthesia Evaluation  Patient identified by MRN, date of birth, ID band Patient awake    Airway Mallampati: I       Dental  (+) Teeth Intact   Pulmonary asthma , pneumonia, resolved, COPD, Current Smoker,  Stable.    + wheezing (left upper lobe wheeze)      Cardiovascular Exercise Tolerance: Good Normal cardiovascular exam Rhythm:Regular Rate:Normal  . Wall thickness was normal. Systolic function was normal. The estimated ejection fraction was in the range of 60% to 65%. Wall motion was normal; there were no regional wall motion abnormalities  (2015)   Neuro/Psych PSYCHIATRIC DISORDERS Anxiety Depression Bipolar Disorder Schizophrenia    GI/Hepatic GERD  Medicated and Controlled,  Endo/Other  Hypothyroidism   Renal/GU      Musculoskeletal  (+) Arthritis , Osteoarthritis,  Fibromyalgia -  Abdominal Normal abdominal exam  (+)   Peds  Hematology   Anesthesia Other Findings   Reproductive/Obstetrics                             Anesthesia Physical Anesthesia Plan  ASA: III  Anesthesia Plan: General   Post-op Pain Management:    Induction: Intravenous  PONV Risk Score and Plan:   Airway Management Planned: LMA  Additional Equipment:   Intra-op Plan:   Post-operative Plan:   Informed Consent: I have reviewed the patients History and Physical, chart, labs and discussed the procedure including the risks, benefits and alternatives for the proposed anesthesia with the patient or authorized representative who has indicated his/her understanding and acceptance.   Dental advisory given  Plan Discussed with: Surgeon  Anesthesia Plan Comments:         Anesthesia Quick Evaluation

## 2017-03-06 NOTE — Anesthesia Procedure Notes (Signed)
Procedure Name: LMA Insertion Date/Time: 03/06/2017 9:00 AM Performed by: Andree Elk, Daquavion Catala A, CRNA Pre-anesthesia Checklist: Patient identified, Timeout performed, Emergency Drugs available, Suction available and Patient being monitored Patient Re-evaluated:Patient Re-evaluated prior to induction Oxygen Delivery Method: Circle system utilized Preoxygenation: Pre-oxygenation with 100% oxygen Induction Type: IV induction Ventilation: Mask ventilation without difficulty LMA: LMA inserted LMA Size: 3.0 Number of attempts: 1 Placement Confirmation: positive ETCO2 and breath sounds checked- equal and bilateral Tube secured with: Tape Dental Injury: Teeth and Oropharynx as per pre-operative assessment

## 2017-03-06 NOTE — Anesthesia Postprocedure Evaluation (Signed)
Anesthesia Post Note Late Entry for 1030  Patient: Meredith [name]  Procedure(s) Performed: CYSTOSCOPY WITH RIGHT RETROGRADE PYELOGRAM, RIGHT URETEROSCOPY AND RIGHT URETERAL STENT PLACEMENT (Right ) RIGHT RENAL STONE EXTRACTION WITH BASKET (Right Ureter)  Patient location during evaluation: PACU Anesthesia Type: General Level of consciousness: awake and alert, oriented and patient cooperative Pain management: pain level controlled Vital Signs Assessment: post-procedure vital signs reviewed and stable Respiratory status: spontaneous breathing and respiratory function stable Cardiovascular status: stable Postop Assessment: no apparent nausea or vomiting Anesthetic complications: no     Last Vitals:  Vitals:   03/06/17 1015 03/06/17 1030  BP: (!) 147/93 (!) 155/91  Pulse: 80 82  Resp: 14 20  Temp:    SpO2: 99% 99%    Last Pain:  Vitals:   03/06/17 1015  TempSrc:   PainSc: 6                  ADAMS, AMY A
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
## 2017-03-06 NOTE — Transfer of Care (Signed)
Immediate Anesthesia Transfer of Care Note  Patient: Jereline M Idris  Procedure(s) Performed: CYSTOSCOPY WITH RIGHT RETROGRADE PYELOGRAM, RIGHT URETEROSCOPY AND RIGHT URETERAL STENT PLACEMENT (Right ) RIGHT RENAL STONE EXTRACTION WITH BASKET (Right Ureter)  Patient Location: PACU  Anesthesia Type:General  Level of Consciousness: awake, alert , oriented and patient cooperative  Airway & Oxygen Therapy: Patient Spontanous Breathing  Post-op Assessment: Report given to RN and Post -op Vital signs reviewed and stable  Post vital signs: Reviewed and stable  Last Vitals:  Vitals:   03/06/17 0810 03/06/17 1000  BP: 108/63 (!) (P) 139/100  Pulse:  (P) 83  Resp: 15 (P) 14  Temp:  (P) 36.5 C  SpO2: 100% (P) 100%    Last Pain:  Vitals:   03/06/17 0753  TempSrc: Oral  PainSc: 6       Patients Stated Pain Goal: 5 (37/10/62 6948)  Complications: No apparent anesthesia complications

## 2017-03-07 ENCOUNTER — Encounter (HOSPITAL_COMMUNITY): Payer: Self-pay | Admitting: Urology

## 2017-03-09 ENCOUNTER — Ambulatory Visit: Payer: Medicaid Other | Admitting: Obstetrics and Gynecology

## 2017-03-16 LAB — STONE ANALYSIS
CA OXALATE, DIHYDRATE: 15 %
Ca Oxalate,Monohydr.: 55 %
Ca phos cry stone ql IR: 30 %
Stone Weight KSTONE: 38 mg

## 2017-03-26 ENCOUNTER — Encounter (HOSPITAL_COMMUNITY): Payer: Self-pay | Admitting: Emergency Medicine

## 2017-03-26 ENCOUNTER — Emergency Department (HOSPITAL_COMMUNITY)
Admission: EM | Admit: 2017-03-26 | Discharge: 2017-03-27 | Disposition: A | Discharge status: Home / Self Care | Payer: Medicaid Other | Attending: Emergency Medicine | Admitting: Emergency Medicine

## 2017-03-26 DIAGNOSIS — E039 Hypothyroidism, unspecified: Secondary | ICD-10-CM | POA: Diagnosis not present

## 2017-03-26 DIAGNOSIS — F419 Anxiety disorder, unspecified: Secondary | ICD-10-CM | POA: Insufficient documentation

## 2017-03-26 DIAGNOSIS — Z79899 Other long term (current) drug therapy: Secondary | ICD-10-CM | POA: Insufficient documentation

## 2017-03-26 DIAGNOSIS — F319 Bipolar disorder, unspecified: Secondary | ICD-10-CM | POA: Diagnosis not present

## 2017-03-26 DIAGNOSIS — F17210 Nicotine dependence, cigarettes, uncomplicated: Secondary | ICD-10-CM | POA: Insufficient documentation

## 2017-03-26 DIAGNOSIS — J45909 Unspecified asthma, uncomplicated: Secondary | ICD-10-CM | POA: Insufficient documentation

## 2017-03-26 DIAGNOSIS — J449 Chronic obstructive pulmonary disease, unspecified: Secondary | ICD-10-CM | POA: Diagnosis not present

## 2017-03-26 DIAGNOSIS — Z85828 Personal history of other malignant neoplasm of skin: Secondary | ICD-10-CM | POA: Diagnosis not present

## 2017-03-26 DIAGNOSIS — F209 Schizophrenia, unspecified: Secondary | ICD-10-CM | POA: Diagnosis not present

## 2017-03-26 DIAGNOSIS — L049 Acute lymphadenitis, unspecified: Secondary | ICD-10-CM

## 2017-03-26 DIAGNOSIS — L048 Acute lymphadenitis of other sites: Secondary | ICD-10-CM | POA: Insufficient documentation

## 2017-03-26 DIAGNOSIS — R220 Localized swelling, mass and lump, head: Secondary | ICD-10-CM | POA: Diagnosis present

## 2017-03-26 NOTE — ED Triage Notes (Signed)
Pt C/O "knot to the back of my head." Pt states the area burns and has gotten bigger since this morning.

## 2017-03-27 MED ORDER — CEPHALEXIN 500 MG PO CAPS
500.0000 mg | ORAL_CAPSULE | Freq: Once | ORAL | Status: AC
  Administered 2017-03-27: 500 mg via ORAL
  Filled 2017-03-27: qty 1

## 2017-03-27 MED ORDER — CEPHALEXIN 500 MG PO CAPS: 500.0000 mg | ORAL_CAPSULE | Freq: Four times a day (QID) | ORAL | 0 refills | Status: DC

## 2017-03-27 MED ORDER — IBUPROFEN 800 MG PO TABS
800.0000 mg | ORAL_TABLET | Freq: Once | ORAL | Status: AC
  Administered 2017-03-27: 800 mg via ORAL
  Filled 2017-03-27: qty 1

## 2017-03-27 MED ORDER — IBUPROFEN 800 MG PO TABS: 800.0000 mg | ORAL_TABLET | Freq: Three times a day (TID) | ORAL | 0 refills | Status: DC

## 2017-03-27 NOTE — Discharge Instructions (Signed)
Take your entire course of antibiotics.  Use ibuprofen for pain and inflammation.  A heating pad applied to the site 20 minutes several times daily may help resolve this quicker.  As discussed you need to have this rechecked if this treatment does not completely resolve the pain and swelling.
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
## 2017-03-27 NOTE — ED Notes (Signed)
Pt ambulatory to waiting room. Pt verbalized understanding of discharge instructions.   

## 2017-04-05 ENCOUNTER — Ambulatory Visit: Payer: Self-pay | Admitting: Urology

## 2017-04-12 ENCOUNTER — Ambulatory Visit: Payer: Self-pay | Admitting: Urology

## 2017-04-12 ENCOUNTER — Ambulatory Visit (HOSPITAL_COMMUNITY)
Admission: RE | Admit: 2017-04-12 | Discharge: 2017-04-12 | Disposition: A | Discharge status: Home / Self Care | Payer: Medicaid Other | Source: Ambulatory Visit | Attending: Family Medicine | Admitting: Family Medicine

## 2017-04-12 ENCOUNTER — Other Ambulatory Visit (HOSPITAL_COMMUNITY): Payer: Self-pay | Admitting: Family Medicine

## 2017-04-12 ENCOUNTER — Encounter (HOSPITAL_COMMUNITY): Payer: Self-pay

## 2017-04-12 DIAGNOSIS — J181 Lobar pneumonia, unspecified organism: Secondary | ICD-10-CM | POA: Diagnosis not present

## 2017-04-12 DIAGNOSIS — J440 Chronic obstructive pulmonary disease with acute lower respiratory infection: Secondary | ICD-10-CM | POA: Insufficient documentation

## 2017-04-12 DIAGNOSIS — J441 Chronic obstructive pulmonary disease with (acute) exacerbation: Secondary | ICD-10-CM | POA: Diagnosis present

## 2017-04-12 DIAGNOSIS — R062 Wheezing: Secondary | ICD-10-CM

## 2017-05-04 ENCOUNTER — Encounter: Payer: Self-pay | Admitting: Obstetrics and Gynecology

## 2017-05-04 ENCOUNTER — Encounter (INDEPENDENT_AMBULATORY_CARE_PROVIDER_SITE_OTHER): Payer: Self-pay

## 2017-05-04 ENCOUNTER — Ambulatory Visit: Payer: Medicaid Other | Admitting: Obstetrics and Gynecology

## 2017-05-04 VITALS — BP 120/60 | HR 88 | Ht 65.0 in | Wt 170.4 lb

## 2017-05-04 DIAGNOSIS — R682 Dry mouth, unspecified: Secondary | ICD-10-CM

## 2017-05-04 DIAGNOSIS — L739 Follicular disorder, unspecified: Secondary | ICD-10-CM | POA: Diagnosis not present

## 2017-05-04 DIAGNOSIS — F5231 Female orgasmic disorder: Secondary | ICD-10-CM | POA: Diagnosis not present

## 2017-05-04 MED ORDER — CEPHALEXIN 500 MG PO CAPS: 500.0000 mg | ORAL_CAPSULE | Freq: Four times a day (QID) | ORAL | 0 refills | Status: DC
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
## 2017-05-05 LAB — HEMOGLOBIN A1C
ESTIMATED AVERAGE GLUCOSE: 111 mg/dL
Hgb A1c MFr Bld: 5.5 % (ref 4.8–5.6)

## 2017-05-15 ENCOUNTER — Telehealth: Payer: Self-pay | Admitting: *Deleted

## 2017-05-15 NOTE — Telephone Encounter (Signed)
Informed of normal Hgb A1c. DOB verified.

## 2017-05-24 ENCOUNTER — Other Ambulatory Visit: Payer: Self-pay | Admitting: Urology

## 2017-05-24 ENCOUNTER — Encounter (HOSPITAL_COMMUNITY): Payer: Self-pay

## 2017-05-24 ENCOUNTER — Ambulatory Visit (HOSPITAL_COMMUNITY): Payer: Medicaid Other

## 2017-05-24 ENCOUNTER — Ambulatory Visit: Payer: Self-pay | Admitting: Urology

## 2017-05-24 DIAGNOSIS — N201 Calculus of ureter: Secondary | ICD-10-CM

## 2017-06-14 ENCOUNTER — Ambulatory Visit (HOSPITAL_COMMUNITY)
Admission: RE | Admit: 2017-06-14 | Discharge: 2017-06-14 | Disposition: A | Discharge status: Home / Self Care | Payer: Medicaid Other | Source: Ambulatory Visit | Attending: Urology | Admitting: Urology

## 2017-06-21 ENCOUNTER — Ambulatory Visit (HOSPITAL_COMMUNITY)
Admission: RE | Admit: 2017-06-21 | Discharge: 2017-06-21 | Disposition: A | Discharge status: Home / Self Care | Payer: Medicaid Other | Source: Ambulatory Visit | Attending: Urology | Admitting: Urology

## 2017-06-21 DIAGNOSIS — N200 Calculus of kidney: Secondary | ICD-10-CM | POA: Diagnosis present

## 2017-06-21 DIAGNOSIS — N201 Calculus of ureter: Secondary | ICD-10-CM

## 2017-07-10 ENCOUNTER — Telehealth: Payer: Self-pay | Admitting: Obstetrics and Gynecology

## 2017-07-10 ENCOUNTER — Telehealth: Payer: Self-pay | Admitting: Internal Medicine

## 2017-07-10 NOTE — Telephone Encounter (Signed)
[phone] PATIENT CALLED INQUIRING ABOUT THE APPROVAL FOR HER MEDICATION AND IF IT HAS NOT BEEN APPROVED CAN SHE COME GET SOME MORE SAMPLES.  PLEASE CALL

## 2017-07-10 NOTE — Telephone Encounter (Signed)
Tried calling pt. Mailbox is full. PA was submitted over the phone. Waiting on approval or denial.

## 2017-07-11 ENCOUNTER — Other Ambulatory Visit: Payer: Self-pay | Admitting: *Deleted

## 2017-07-11 DIAGNOSIS — R921 Mammographic calcification found on diagnostic imaging of breast: Secondary | ICD-10-CM

## 2017-07-11 DIAGNOSIS — Z09 Encounter for follow-up examination after completed treatment for conditions other than malignant neoplasm: Secondary | ICD-10-CM

## 2017-07-11 NOTE — Telephone Encounter (Signed)
LMOVM that orders are in and she can schedule mammogram at [facility].

## 2017-07-11 NOTE — Telephone Encounter (Signed)
Lm on pts cell number. Home number doesn't have a VM set up.

## 2017-07-17 NOTE — Telephone Encounter (Signed)
Called [place] [facility] and Dexilant 60 mg was approved. Left a message on pts hm and cell number.

## 2017-07-18 NOTE — Telephone Encounter (Signed)
Pt returned call and is aware that PA was approved.
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
## 2017-07-20 ENCOUNTER — Ambulatory Visit: Payer: Medicaid Other | Admitting: Cardiology

## 2017-07-20 ENCOUNTER — Encounter: Payer: Self-pay | Admitting: Cardiology

## 2017-07-20 VITALS — BP 116/75 | HR 76 | Ht 64.0 in | Wt 177.8 lb

## 2017-07-20 DIAGNOSIS — Z720 Tobacco use: Secondary | ICD-10-CM | POA: Diagnosis not present

## 2017-07-20 DIAGNOSIS — R002 Palpitations: Secondary | ICD-10-CM | POA: Diagnosis not present

## 2017-07-20 NOTE — Patient Instructions (Signed)

## 2017-08-01 ENCOUNTER — Other Ambulatory Visit: Payer: Self-pay | Admitting: Nurse Practitioner

## 2017-08-01 ENCOUNTER — Encounter (HOSPITAL_COMMUNITY): Payer: Self-pay

## 2017-08-01 ENCOUNTER — Ambulatory Visit: Payer: Medicaid Other | Admitting: Nurse Practitioner

## 2017-08-01 ENCOUNTER — Ambulatory Visit (HOSPITAL_COMMUNITY)
Admission: RE | Admit: 2017-08-01 | Discharge: 2017-08-01 | Disposition: A | Discharge status: Home / Self Care | Payer: Medicaid Other | Source: Ambulatory Visit | Attending: Obstetrics and Gynecology | Admitting: Obstetrics and Gynecology

## 2017-08-01 DIAGNOSIS — R921 Mammographic calcification found on diagnostic imaging of breast: Secondary | ICD-10-CM | POA: Diagnosis present

## 2017-08-01 DIAGNOSIS — Z09 Encounter for follow-up examination after completed treatment for conditions other than malignant neoplasm: Secondary | ICD-10-CM

## 2017-08-09 ENCOUNTER — Ambulatory Visit: Payer: Medicaid Other | Admitting: Urology

## 2017-08-09 DIAGNOSIS — N201 Calculus of ureter: Secondary | ICD-10-CM

## 2017-10-06 ENCOUNTER — Other Ambulatory Visit: Payer: Self-pay | Admitting: Gastroenterology

## 2017-10-16 ENCOUNTER — Ambulatory Visit: Payer: Medicaid Other | Admitting: Nurse Practitioner

## 2017-10-16 ENCOUNTER — Encounter: Payer: Self-pay | Admitting: Nurse Practitioner
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
## 2017-10-26 ENCOUNTER — Other Ambulatory Visit: Payer: Self-pay | Admitting: Nurse Practitioner

## 2017-12-03 ENCOUNTER — Other Ambulatory Visit: Payer: Self-pay

## 2017-12-03 ENCOUNTER — Encounter (HOSPITAL_COMMUNITY): Payer: Self-pay | Admitting: *Deleted

## 2017-12-03 ENCOUNTER — Emergency Department (HOSPITAL_COMMUNITY): Payer: Medicaid Other

## 2017-12-03 ENCOUNTER — Emergency Department (HOSPITAL_COMMUNITY)
Admission: EM | Admit: 2017-12-03 | Discharge: 2017-12-03 | Disposition: A | Discharge status: Home / Self Care | Payer: Medicaid Other | Attending: Emergency Medicine | Admitting: Emergency Medicine

## 2017-12-03 DIAGNOSIS — R140 Abdominal distension (gaseous): Secondary | ICD-10-CM | POA: Diagnosis not present

## 2017-12-03 DIAGNOSIS — F17210 Nicotine dependence, cigarettes, uncomplicated: Secondary | ICD-10-CM | POA: Insufficient documentation

## 2017-12-03 DIAGNOSIS — R609 Edema, unspecified: Secondary | ICD-10-CM

## 2017-12-03 DIAGNOSIS — R600 Localized edema: Secondary | ICD-10-CM | POA: Diagnosis not present

## 2017-12-03 DIAGNOSIS — R2243 Localized swelling, mass and lump, lower limb, bilateral: Secondary | ICD-10-CM | POA: Diagnosis present

## 2017-12-03 DIAGNOSIS — Z79899 Other long term (current) drug therapy: Secondary | ICD-10-CM | POA: Insufficient documentation

## 2017-12-03 DIAGNOSIS — E039 Hypothyroidism, unspecified: Secondary | ICD-10-CM | POA: Insufficient documentation

## 2017-12-03 DIAGNOSIS — J449 Chronic obstructive pulmonary disease, unspecified: Secondary | ICD-10-CM | POA: Diagnosis not present

## 2017-12-03 LAB — CBC WITH DIFFERENTIAL/PLATELET
BASOS ABS: 0 10*3/uL (ref 0.0–0.1)
Basophils Relative: 0 %
Eosinophils Absolute: 0.1 10*3/uL (ref 0.0–0.7)
Eosinophils Relative: 2 %
HEMATOCRIT: 43.5 % (ref 36.0–46.0)
Hemoglobin: 13.8 g/dL (ref 12.0–15.0)
Lymphocytes Relative: 28 %
Lymphs Abs: 2.4 10*3/uL (ref 0.7–4.0)
MCH: 30.5 pg (ref 26.0–34.0)
MCHC: 31.7 g/dL (ref 30.0–36.0)
MCV: 96 fL (ref 78.0–100.0)
Monocytes Absolute: 0.8 10*3/uL (ref 0.1–1.0)
Monocytes Relative: 9 %
NEUTROS ABS: 5.1 10*3/uL (ref 1.7–7.7)
Neutrophils Relative %: 61 %
Platelets: 284 10*3/uL (ref 150–400)
RBC: 4.53 MIL/uL (ref 3.87–5.11)
RDW: 15.3 % (ref 11.5–15.5)
WBC: 8.5 10*3/uL (ref 4.0–10.5)

## 2017-12-03 LAB — URINALYSIS, ROUTINE W REFLEX MICROSCOPIC
Bilirubin Urine: NEGATIVE
Glucose, UA: NEGATIVE mg/dL
HGB URINE DIPSTICK: NEGATIVE
Ketones, ur: NEGATIVE mg/dL
Leukocytes, UA: NEGATIVE
Nitrite: NEGATIVE
PROTEIN: NEGATIVE mg/dL
Specific Gravity, Urine: 1.003 — ABNORMAL LOW (ref 1.005–1.030)
pH: 7 (ref 5.0–8.0)

## 2017-12-03 LAB — COMPREHENSIVE METABOLIC PANEL
ALK PHOS: 99 U/L (ref 38–126)
ALT: 15 U/L (ref 0–44)
AST: 17 U/L (ref 15–41)
Albumin: 3.6 g/dL (ref 3.5–5.0)
Anion gap: 7 (ref 5–15)
BILIRUBIN TOTAL: 0.2 mg/dL — AB (ref 0.3–1.2)
BUN: 12 mg/dL (ref 6–20)
CO2: 25 mmol/L (ref 22–32)
CREATININE: 0.78 mg/dL (ref 0.44–1.00)
Calcium: 8.9 mg/dL (ref 8.9–10.3)
Chloride: 107 mmol/L (ref 98–111)
GFR calc Af Amer: >60 mL/min (ref >60)
Glucose, Bld: 95 mg/dL (ref 70–99)
Potassium: 3.9 mmol/L (ref 3.5–5.1)
Sodium: 139 mmol/L (ref 135–145)
Total Protein: 6.6 g/dL (ref 6.5–8.1)

## 2017-12-03 LAB — BRAIN NATRIURETIC PEPTIDE: B Natriuretic Peptide: 39 pg/mL (ref 0.0–100.0)

## 2017-12-03 MED ORDER — SIMETHICONE 80 MG PO CHEW: 80.0000 mg | CHEWABLE_TABLET | Freq: Four times a day (QID) | ORAL | 0 refills | Status: DC | PRN

## 2017-12-03 MED ORDER — FUROSEMIDE 20 MG PO TABS: 20.0000 mg | ORAL_TABLET | Freq: Every day | ORAL | 0 refills | Status: DC

## 2017-12-03 NOTE — ED Triage Notes (Addendum)
Pt c/o bilateral leg swelling, leg pain that started a few days ago, denies any fever, does admit to chills and nausea.  Pt also c/o cough, sob that is worse with exertion, has been using inhalers more lately,
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
## 2017-12-19 ENCOUNTER — Other Ambulatory Visit: Payer: Self-pay | Admitting: Obstetrics and Gynecology

## 2017-12-25 ENCOUNTER — Other Ambulatory Visit: Payer: Self-pay | Admitting: Obstetrics and Gynecology

## 2018-01-15 ENCOUNTER — Ambulatory Visit: Payer: Medicaid Other | Admitting: Obstetrics and Gynecology

## 2018-01-17 ENCOUNTER — Other Ambulatory Visit (HOSPITAL_COMMUNITY): Payer: Self-pay | Admitting: Respiratory Therapy

## 2018-01-17 DIAGNOSIS — J441 Chronic obstructive pulmonary disease with (acute) exacerbation: Secondary | ICD-10-CM

## 2018-01-25 ENCOUNTER — Encounter (INDEPENDENT_AMBULATORY_CARE_PROVIDER_SITE_OTHER): Payer: Self-pay

## 2018-01-25 ENCOUNTER — Ambulatory Visit: Payer: Medicaid Other | Admitting: Obstetrics and Gynecology

## 2018-01-25 ENCOUNTER — Encounter: Payer: Self-pay | Admitting: Obstetrics and Gynecology

## 2018-01-25 VITALS — BP 123/76 | HR 94 | Ht 63.0 in | Wt 188.6 lb

## 2018-01-25 DIAGNOSIS — R238 Other skin changes: Secondary | ICD-10-CM | POA: Diagnosis not present

## 2018-01-25 DIAGNOSIS — L309 Dermatitis, unspecified: Secondary | ICD-10-CM | POA: Insufficient documentation

## 2018-01-25 MED ORDER — ACYCLOVIR 400 MG PO TABS: 400.0000 mg | ORAL_TABLET | Freq: Every day | ORAL | 1 refills | Status: DC

## 2018-01-25 MED ORDER — HYDROCORTISONE 1 % EX OINT: 1.0000 "application " | TOPICAL_OINTMENT | Freq: Two times a day (BID) | CUTANEOUS | 99 refills | Status: DC

## 2018-01-25 NOTE — Patient Instructions (Signed)
herpe Genital Herpes Genital herpes is a common sexually transmitted infection (STI) that is caused by a virus. The virus spreads from person to person through sexual contact. Infection can cause itching, blisters, and sores around the genitals or rectum. Symptoms may last several days and then go away This is called an outbreak. However, the virus remains in your body, so you may have more outbreaks in the future. The time between outbreaks varies and can be months or years. Genital herpes affects men and women. It is particularly concerning for pregnant women because the virus can be passed to the baby during delivery and can cause serious problems. Genital herpes is also a concern for people who have a weak disease-fighting (immune) system. What are the causes? This condition is caused by the herpes simplex virus (HSV) type 1 or type 2. The virus may spread through:  Sexual contact with an infected person, including vaginal, anal, and oral sex.  Contact with fluid from a herpes sore.  The skin. This means that you can get herpes from an infected partner even if he or she does not have a visible sore or does not know that he or she is infected.  What increases the risk? You are more likely to develop this condition if:  You have sex with many partners.  You do not use latex condoms during sex.  What are the signs or symptoms? Most people do not have symptoms (asymptomatic) or have mild symptoms that may be mistaken for other skin problems. Symptoms may include:  Small red bumps near the genitals, rectum, or mouth. These bumps turn into blisters and then turn into sores.  Flu-like symptoms, including: ? Fever. ? Body aches. ? Swollen lymph nodes. ? Headache.  Painful urination.  Pain and itching in the genital area or rectal area.  Vaginal discharge.  Tingling or shooting pain in the legs and buttocks.  Generally, symptoms are more severe and last longer during the first  (primary) outbreak. Flu-like symptoms are also more common during the primary outbreak. How is this diagnosed? Genital herpes may be diagnosed based on:  A physical exam.  Your medical history.  Blood tests.  A test of a fluid sample (culture) from an open sore.  How is this treated? There is no cure for this condition, but treatment with antiviral medicines that are taken by mouth (orally) can do the following:  Speed up healing and relieve symptoms.  Help to reduce the spread of the virus to sexual partners.  Limit the chance of future outbreaks, or make future outbreaks shorter.  Lessen symptoms of future outbreaks.  Your health care provider may also recommend pain relief medicines, such as aspirin or ibuprofen. Follow these instructions at home: Sexual activity  Do not have sexual contact during active outbreaks.  Practice safe sex. Latex condoms and female condoms may help prevent the spread of the herpes virus. General instructions  Keep the affected areas dry and clean.  Take over-the-counter and prescription medicines only as told by your health care provider.  Avoid rubbing or touching blisters and sores. If you do touch blisters or sores: ? Wash your hands thoroughly with soap and water. ? Do not touch your eyes afterward.  To help relieve pain or itching, you may take the following actions as directed by your health care provider: ? Apply a cold, wet cloth (cold compress) to affected areas 4-6 times a day. ? Apply a substance that protects your skin and reduces bleeding (  astringent). ? Apply a gel that helps relieve pain around sores (lidocaine gel). ? Take a warm, shallow bath that cleans the genital area (sitz bath).  Keep all follow-up visits as told by your health care provider. This is important. How is this prevented?  Use condoms. Although anyone can get genital herpes during sexual contact, even with the use of a condom, a condom can provide some  protection.  Avoid having multiple sexual partners.  Talk with your sexual partner about any symptoms either of you may have. Also, talk with your partner about any history of STIs.  Get tested for STIs before you have sex. Ask your partner to do the same.  Do not have sexual contact if you have symptoms of genital herpes. Contact a health care provider if:  Your symptoms are not improving with medicine.  Your symptoms return.  You have new symptoms.  You have a fever.  You have abdominal pain.  You have redness, swelling, or pain in your eye.  You notice new sores on other parts of your body.  You are a woman and experience bleeding between menstrual periods.  You have had herpes and you become pregnant or plan to become pregnant. Summary  Genital herpes is a common sexually transmitted infection (STI) that is caused by the herpes simplex virus (HSV) type 1 or type 2.  These viruses are most often spread through sexual contact with an infected person.  You are more likely to develop this condition if you have sex with many partners or you have unprotected sex.  Most people do not have symptoms (asymptomatic) or have mild symptoms that may be mistaken for other skin problems. Symptoms occur as outbreaks that may happen months or years apart.  There is no cure for this condition, but treatment with oral antiviral medicines can reduce symptoms, reduce the chance of spreading the virus to a partner, prevent future outbreaks, or shorten future outbreaks. This information is not intended to replace advice given to you by your health care provider. Make sure you discuss any questions you have with your health care provider. Document Released: 03/18/2000 Document Revised: 02/19/2016 Document Reviewed: 02/19/2016 Elsevier Interactive Patient Education  [employer].
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
## 2018-01-26 ENCOUNTER — Telehealth: Payer: Self-pay | Admitting: *Deleted

## 2018-01-28 ENCOUNTER — Telehealth: Payer: Self-pay | Admitting: Obstetrics and Gynecology

## 2018-01-28 LAB — HERPES SIMPLEX VIRUS CULTURE

## 2018-01-28 NOTE — Telephone Encounter (Signed)
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 She may get the med refilled, as prescribed , if not healed completely at end of first Rx. Pt to avoid sexual contact until completely healed plus 2 wks. Pt to make appt to discuss further if desired.

## 2018-01-29 ENCOUNTER — Telehealth: Payer: Self-pay | Admitting: *Deleted

## 2018-01-29 NOTE — Telephone Encounter (Signed)
VM not set up.  Message left on other number to return my call.

## 2018-01-29 NOTE — Telephone Encounter (Signed)
Patient states she received a call from Dr Glo Herring last night regarding her test.  She is going to keep her next appt as she has some questions.   Also states she received a letter that she needs another diagnostic mammo 6 months from last one in April.  Report pulled and states recommend 12 month f/u.  Pt verbalized understanding and will schedule for April.

## 2018-02-01 ENCOUNTER — Other Ambulatory Visit: Payer: Self-pay | Admitting: Urology

## 2018-02-01 DIAGNOSIS — N201 Calculus of ureter: Secondary | ICD-10-CM

## 2018-02-02 ENCOUNTER — Other Ambulatory Visit: Payer: Self-pay | Admitting: Obstetrics and Gynecology

## 2018-02-07 ENCOUNTER — Ambulatory Visit (HOSPITAL_COMMUNITY)
Admission: RE | Admit: 2018-02-07 | Discharge: 2018-02-07 | Disposition: A | Discharge status: Home / Self Care | Payer: Medicaid Other | Source: Ambulatory Visit | Attending: Pulmonary Disease | Admitting: Pulmonary Disease

## 2018-02-07 ENCOUNTER — Ambulatory Visit: Payer: Medicaid Other | Admitting: Nurse Practitioner

## 2018-02-07 ENCOUNTER — Telehealth: Payer: Self-pay | Admitting: Nurse Practitioner

## 2018-02-07 DIAGNOSIS — J441 Chronic obstructive pulmonary disease with (acute) exacerbation: Secondary | ICD-10-CM | POA: Insufficient documentation

## 2018-02-07 LAB — PULMONARY FUNCTION TEST
DL/VA % PRED: 61 %
DL/VA: 2.89 ml/min/mmHg/L
DLCO UNC % PRED: 48 %
DLCO unc: 11.2 ml/min/mmHg
FEF 25-75 PRE: 3.48 L/s
FEF2575-%Pred-Pre: 130 %
FEV1-%Pred-Pre: 85 %
FEV1-PRE: 2.3 L
FEV1FVC-%Pred-Pre: 111 %
FEV6-%PRED-PRE: 77 %
FEV6-Pre: 2.57 L
FEV6FVC-%PRED-PRE: 102 %
FVC-%Pred-Pre: 75 %
FVC-Pre: 2.57 L
Pre FEV1/FVC ratio: 89 %
Pre FEV6/FVC Ratio: 100 %
RV % PRED: 143 %
RV: 2.51 L
TLC % pred: 101 %
TLC: 4.99 L
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
## 2018-02-07 NOTE — Telephone Encounter (Signed)
Patient no show x 4, received first no show letter with no show policy on 67/73/73.  Please advise if I need to send another letter.

## 2018-02-07 NOTE — Telephone Encounter (Signed)
Routing to Dr. Gala Romney to get order to discharge from the practice.

## 2018-02-08 ENCOUNTER — Encounter: Payer: Self-pay | Admitting: General Practice

## 2018-02-08 NOTE — Telephone Encounter (Signed)
discharge letter mailed  

## 2018-02-08 NOTE — Telephone Encounter (Signed)
Discharge

## 2018-02-14 ENCOUNTER — Ambulatory Visit: Payer: Self-pay | Admitting: Urology

## 2018-02-14 ENCOUNTER — Other Ambulatory Visit: Payer: Self-pay | Admitting: Urology

## 2018-02-16 ENCOUNTER — Ambulatory Visit (HOSPITAL_COMMUNITY)
Admission: RE | Admit: 2018-02-16 | Discharge: 2018-02-16 | Disposition: A | Discharge status: Home / Self Care | Payer: Medicaid Other | Source: Ambulatory Visit | Attending: Urology | Admitting: Urology

## 2018-02-16 DIAGNOSIS — N200 Calculus of kidney: Secondary | ICD-10-CM | POA: Diagnosis not present

## 2018-02-16 DIAGNOSIS — N201 Calculus of ureter: Secondary | ICD-10-CM

## 2018-02-22 ENCOUNTER — Ambulatory Visit: Payer: Medicaid Other | Admitting: Obstetrics and Gynecology

## 2018-04-02 ENCOUNTER — Ambulatory Visit (INDEPENDENT_AMBULATORY_CARE_PROVIDER_SITE_OTHER): Payer: Medicaid Other | Admitting: Internal Medicine

## 2018-04-02 ENCOUNTER — Encounter (INDEPENDENT_AMBULATORY_CARE_PROVIDER_SITE_OTHER): Payer: Self-pay | Admitting: Internal Medicine

## 2018-04-02 VITALS — BP 104/62 | HR 80 | Temp 98.3°F | Ht 63.0 in | Wt 175.5 lb

## 2018-04-02 DIAGNOSIS — R112 Nausea with vomiting, unspecified: Secondary | ICD-10-CM

## 2018-04-02 DIAGNOSIS — R1319 Other dysphagia: Secondary | ICD-10-CM

## 2018-04-02 DIAGNOSIS — Z1211 Encounter for screening for malignant neoplasm of colon: Secondary | ICD-10-CM

## 2018-04-02 DIAGNOSIS — R1310 Dysphagia, unspecified: Secondary | ICD-10-CM

## 2018-04-02 NOTE — Patient Instructions (Signed)
EGD/ED, colonoscopy. The risks of bleeding, perforation and infection were reviewed with patient.
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
## 2018-04-05 ENCOUNTER — Other Ambulatory Visit (INDEPENDENT_AMBULATORY_CARE_PROVIDER_SITE_OTHER): Payer: Self-pay | Admitting: Internal Medicine

## 2018-04-05 ENCOUNTER — Telehealth (INDEPENDENT_AMBULATORY_CARE_PROVIDER_SITE_OTHER): Payer: Self-pay | Admitting: *Deleted

## 2018-04-05 ENCOUNTER — Other Ambulatory Visit: Payer: Self-pay | Admitting: Gastroenterology

## 2018-04-05 ENCOUNTER — Encounter (INDEPENDENT_AMBULATORY_CARE_PROVIDER_SITE_OTHER): Payer: Self-pay | Admitting: *Deleted

## 2018-04-05 DIAGNOSIS — K219 Gastro-esophageal reflux disease without esophagitis: Secondary | ICD-10-CM

## 2018-04-05 DIAGNOSIS — Z1211 Encounter for screening for malignant neoplasm of colon: Secondary | ICD-10-CM

## 2018-04-05 DIAGNOSIS — R1319 Other dysphagia: Secondary | ICD-10-CM | POA: Insufficient documentation

## 2018-04-05 DIAGNOSIS — R1310 Dysphagia, unspecified: Secondary | ICD-10-CM

## 2018-04-05 MED ORDER — SUPREP BOWEL PREP KIT 17.5-3.13-1.6 GM/177ML PO SOLN: 1.0000 | Freq: Once | ORAL | 0 refills | Status: AC

## 2018-04-05 NOTE — Telephone Encounter (Signed)
Patient needs suprep 

## 2018-04-08 ENCOUNTER — Other Ambulatory Visit: Payer: Self-pay | Admitting: Gastroenterology

## 2018-04-12 NOTE — Patient Instructions (Signed)
Meredith [name]  04/12/2018     @PREFPERIOPPHARMACY @   Your procedure is scheduled on  04/27/2018.  Report to [facility] at  615   A.M.  Call this number if you have problems the morning of surgery:  [phone]   Remember:  Follow the diet and prep instructions given to you by Dr Olevia Perches office.                      Take these medicines the morning of surgery with A SIP OF WATER  Xanax ( if needed), adderall, suboxone, wellbutrin, flexaril ( if needed), dexilant, levothyroxine, zofran ( if needed). Use your inhalers and your nebulizer before you come.    Do not wear jewelry, make-up or nail polish.  Do not wear lotions, powders, or perfumes, or deodorant.  Do not shave 48 hours prior to surgery.  Men may shave face and neck.  Do not bring valuables to the hospital.  [facility] is not responsible for any belongings or valuables.  Contacts, dentures or bridgework may not be worn into surgery.  Leave your suitcase in the car.  After surgery it may be brought to your room.  For patients admitted to the hospital, discharge time will be determined by your treatment team.  Patients discharged the day of surgery will not be allowed to drive home.   Name and phone number of your driver:   family Special instructions:  STOP your phentermine until after your procedure.  Please read over the following fact sheets that you were given. Anesthesia Post-op Instructions and Care and Recovery After Surgery       Upper Endoscopy, Adult Upper endoscopy is a procedure to look inside the upper GI (gastrointestinal) tract. The upper GI tract is made up of:  The part of the body that moves food from your mouth to your stomach (esophagus).  The stomach.  The first part of your small intestine (duodenum). This procedure is also called esophagogastroduodenoscopy (EGD) or gastroscopy. In this procedure, your health care provider passes a thin, flexible tube  (endoscope) through your mouth and down your esophagus into your stomach. A small camera is attached to the end of the tube. Images from the camera appear on a monitor in the exam room. During this procedure, your health care provider may also remove a small piece of tissue to be sent to a lab and examined under a microscope (biopsy). Your health care provider may do an upper endoscopy to diagnose cancers of the upper GI tract. You may also have this procedure to find the cause of other conditions, such as:  Stomach pain.  Heartburn.  Pain or problems when swallowing.  Nausea and vomiting.  Stomach bleeding.  Stomach ulcers. Tell a health care provider about:  Any allergies you have.  All medicines you are taking, including vitamins, herbs, eye drops, creams, and over-the-counter medicines.  Any problems you or family members have had with anesthetic medicines.  Any blood disorders you have.  Any surgeries you have had.  Any medical conditions you have.  Whether you are pregnant or may be pregnant. What are the risks? Generally, this is a safe procedure. However, problems may occur, including:  Infection.  Bleeding.  Allergic reactions to medicines.  A tear or hole (perforation) in the esophagus, stomach, or duodenum. What happens before the procedure? Staying hydrated Follow instructions from your  health care provider about hydration, which may include:  Up to 2 hours before the procedure - you may continue to drink clear liquids, such as water, clear fruit juice, black coffee, and plain tea.  Eating and drinking restrictions Follow instructions from your health care provider about eating and drinking, which may include:  8 hours before the procedure - stop eating heavy meals or foods, such as meat, fried foods, or fatty foods.  6 hours before the procedure - stop eating light meals or foods, such as toast or cereal.  6 hours before the procedure - stop drinking  milk or drinks that contain milk.  2 hours before the procedure - stop drinking clear liquids. Medicines Ask your health care provider about:  Changing or stopping your regular medicines. This is especially important if you are taking diabetes medicines or blood thinners.  Taking medicines such as aspirin and ibuprofen. These medicines can thin your blood. Do not take these medicines unless your health care provider tells you to take them.  Taking over-the-counter medicines, vitamins, herbs, and supplements. General instructions  Plan to have someone take you home from the hospital or clinic.  If you will be going home right after the procedure, plan to have someone with you for 24 hours.  Ask your health care provider what steps will be taken to help prevent infection. What happens during the procedure?   An IV will be inserted into one of your veins.  You may be given one or more of the following: ? A medicine to help you relax (sedative). ? A medicine to numb the throat (local anesthetic).  You will lie on your left side on an exam table.  Your health care provider will pass the endoscope through your mouth and down your esophagus.  Your health care provider will use the scope to check the inside of your esophagus, stomach, and duodenum. Biopsies may be taken.  The endoscope will be removed. The procedure may vary among health care providers and hospitals. What happens after the procedure?  Your blood pressure, heart rate, breathing rate, and blood oxygen level will be monitored until you leave the hospital or clinic.  Do not drive for 24 hours if you were given a sedative during your procedure.  When your throat is no longer numb, you may be given some fluids to drink.  It is up to you to get the results of your procedure. Ask your health care provider, or the department that is doing the procedure, when your results will be ready. Summary  Upper endoscopy is a  procedure to look inside the upper GI tract.  During the procedure, an IV will be inserted into one of your veins. You may be given a medicine to help you relax.  A medicine will be used to numb your throat.  The endoscope will be passed through your mouth and down your esophagus. This information is not intended to replace advice given to you by your health care provider. Make sure you discuss any questions you have with your health care provider. Document Released: 03/18/2000 Document Revised: 08/21/2017 Document Reviewed: 08/21/2017 Elsevier Interactive Patient Education  2019 [facility], Adult, Care After This sheet gives you information about how to care for yourself after your procedure. Your health care provider may also give you more specific instructions. If you have problems or questions, contact your health care provider. What can I expect after the procedure? After the procedure, it is common  to have:  A sore throat.  Mild stomach pain or discomfort.  Bloating.  Nausea. Follow these instructions at home:   Follow instructions from your health care provider about what to eat or drink after your procedure.  Return to your normal activities as told by your health care provider. Ask your health care provider what activities are safe for you.  Take over-the-counter and prescription medicines only as told by your health care provider.  Do not drive for 24 hours if you were given a sedative during your procedure.  Keep all follow-up visits as told by your health care provider. This is important. Contact a health care provider if you have:  A sore throat that lasts longer than one day.  Trouble swallowing. Get help right away if:  You vomit blood or your vomit looks like coffee grounds.  You have: ? A fever. ? Bloody, black, or tarry stools. ? A severe sore throat or you cannot swallow. ? Difficulty breathing. ? Severe pain in your chest or  abdomen. Summary  After the procedure, it is common to have a sore throat, mild stomach discomfort, bloating, and nausea.  Do not drive for 24 hours if you were given a sedative during the procedure.  Follow instructions from your health care provider about what to eat or drink after your procedure.  Return to your normal activities as told by your health care provider. This information is not intended to replace advice given to you by your health care provider. Make sure you discuss any questions you have with your health care provider. Document Released: 09/20/2011 Document Revised: 08/21/2017 Document Reviewed: 08/21/2017 Elsevier Interactive Patient Education  2019 [employer]  Esophageal Dilatation Esophageal dilatation, also called esophageal dilation, is a procedure to widen or open (dilate) a blocked or narrowed part of the esophagus. The esophagus is the part of the body that moves food and liquid from the mouth to the stomach. You may need this procedure if:  You have a buildup of scar tissue in your esophagus that makes it difficult, painful, or impossible to swallow. This can be caused by gastroesophageal reflux disease (GERD).  You have cancer of the esophagus.  There is a problem with how food moves through your esophagus. In some cases, you may need this procedure repeated at a later time to dilate the esophagus gradually. Tell a health care provider about:  Any allergies you have.  All medicines you are taking, including vitamins, herbs, eye drops, creams, and over-the-counter medicines.  Any problems you or family members have had with anesthetic medicines.  Any blood disorders you have.  Any surgeries you have had.  Any medical conditions you have.  Any antibiotic medicines you are required to take before dental procedures.  Whether you are pregnant or may be pregnant. What are the risks? Generally, this is a safe procedure. However, problems may occur,  including:  Bleeding due to a tear in the lining of the esophagus.  A hole (perforation) in the esophagus. What happens before the procedure?  Follow instructions from your health care provider about eating or drinking restrictions.  Ask your health care provider about changing or stopping your regular medicines. This is especially important if you are taking diabetes medicines or blood thinners.  Plan to have someone take you home from the hospital or clinic.  Plan to have a responsible adult care for you for at least 24 hours after you leave the hospital or clinic. This is important. What  happens during the procedure?  You may be given a medicine to help you relax (sedative).  A numbing medicine may be sprayed into the back of your throat, or you may gargle the medicine.  Your health care provider may perform the dilatation using various surgical instruments, such as: ? Simple dilators. This instrument is carefully placed in the esophagus to stretch it. ? Guided wire bougies. This involves using an endoscope to insert a wire into the esophagus. A dilator is passed over this wire to enlarge the esophagus. Then the wire is removed. ? Balloon dilators. An endoscope with a small balloon at the end is inserted into the esophagus. The balloon is inflated to stretch the esophagus and open it up. The procedure may vary among health care providers and hospitals. What happens after the procedure?  Your blood pressure, heart rate, breathing rate, and blood oxygen level will be monitored until the medicines you were given have worn off.  Your throat may feel slightly sore and numb. This will improve slowly over time.  You will not be allowed to eat or drink until your throat is no longer numb.  When you are able to drink, urinate, and sit on the edge of the bed without nausea or dizziness, you may be able to return home. Follow these instructions at home:  Take over-the-counter and  prescription medicines only as told by your health care provider.  Do not drive for 24 hours if you were given a sedative during your procedure.  You should have a responsible adult with you for 24 hours after the procedure.  Follow instructions from your health care provider about any eating or drinking restrictions.  Do not use any products that contain nicotine or tobacco, such as cigarettes and e-cigarettes. If you need help quitting, ask your health care provider.  Keep all follow-up visits as told by your health care provider. This is important. Get help right away if you:  Have a fever.  Have chest pain.  Have pain that is not relieved by medication.  Have trouble breathing.  Have trouble swallowing.  Vomit blood. Summary  Esophageal dilatation, also called esophageal dilation, is a procedure to widen or open (dilate) a blocked or narrowed part of the esophagus.  Plan to have someone take you home from the hospital or clinic.  For this procedure, a numbing medicine may be sprayed into the back of your throat, or you may gargle the medicine.  Do not drive for 24 hours if you were given a sedative during your procedure. This information is not intended to replace advice given to you by your health care provider. Make sure you discuss any questions you have with your health care provider. Document Released: 05/12/2005 Document Revised: 01/24/2017 Document Reviewed: 01/24/2017 Elsevier Interactive Patient Education  2019 [employer].  Colonoscopy, Adult A colonoscopy is an exam to look at the large intestine. It is done to check for problems, such as:  Lumps (tumors).  Growths (polyps).  Swelling (inflammation).  Bleeding. What happens before the procedure? Eating and drinking Follow instructions from your doctor about eating and drinking. These instructions may include:  A few days before the procedure - follow a low-fiber diet. ? Avoid nuts. ? Avoid  seeds. ? Avoid dried fruit. ? Avoid raw fruits. ? Avoid vegetables.  1-3 days before the procedure - follow a clear liquid diet. Avoid liquids that have red or purple dye. Drink only clear liquids, such as: ? Clear broth or bouillon. ?  Black coffee or tea. ? Clear juice. ? Clear soft drinks or sports drinks. ? Gelatin dessert. ? Popsicles.  On the day of the procedure - do not eat or drink anything during the 2 hours before the procedure. Up to 2 hours before the procedure, you may continue to drink clear liquids, such as water or clear fruit juice.  Bowel prep If you were prescribed an oral bowel prep:  Take it as told by your doctor. Starting the day before your procedure, you will need to drink a lot of liquid. The liquid will cause you to poop (have bowel movements) until your poop is almost clear or light green.  To clean out your colon, you may also be given: ? Laxative medicines. ? Instructions about how to use an enema.  If your skin or butt gets irritated from diarrhea, you may: ? Wipe the area with wipes that have medicine in them, such as adult wet wipes with aloe and vitamin E. ? Put something on your skin that soothes the area, such as petroleum jelly.  If you throw up (vomit) while drinking the bowel prep, take a break for up to 60 minutes. Then begin the bowel prep again. If you keep throwing up and you cannot take the bowel prep without throwing up, call your doctor. General instructions  Ask your doctor about: ? Changing or stopping your normal medicines. This is important if you take iron pills, diabetes medicines, or blood thinners. ? Taking medicines such as aspirin and ibuprofen. These medicines can thin your blood. Do not take these medicines unless your doctor tells you to take them.  Plan to have someone take you home from the hospital or clinic. What happens during the procedure?   An IV tube may be put into one of your veins.  You will be given  medicine to help you relax (sedative).  To reduce your risk of infection: ? Your doctors will wash their hands. ? Your anal area will be washed with soap.  You will be asked to lie on your side with your knees bent.  Your doctor will get a long, thin, flexible tube ready. The tube will have a camera and a light on the end.  The tube will be put into your anus.  The tube will be gently put into your large intestine.  Air will be delivered into your large intestine to keep it open. You may feel some pressure or cramping.  The camera will be used to take photos.  A small tissue sample may be removed for testing (biopsy).  If small growths are found, your doctor may remove them and have them checked for cancer.  The tube that was put into your anus will be slowly removed. The procedure may vary among doctors and hospitals. What happens after the procedure?  Your doctor will check on you often until the medicines you were given have worn off.  Do not drive for 24 hours after the procedure.  You may have a small amount of blood in your poop.  You may pass gas.  You may have mild cramps or bloating in your belly (abdomen).  It is up to you to get the results of your procedure. Ask your doctor, or the department performing the procedure, when your results will be ready. Summary  A colonoscopy is an exam to look at the large intestine.  Follow instructions from your doctor about eating and drinking before the procedure.  If you were  prescribed an oral bowel prep to clean out your colon, take it as told by your doctor.  Your doctor will check on you often until the medicines you were given have worn off.  Plan to have someone take you home from the hospital or clinic. This information is not intended to replace advice given to you by your health care provider. Make sure you discuss any questions you have with your health care provider. Document Released: 04/23/2010 Document  Revised: 01/18/2017 Document Reviewed: 06/02/2015 Elsevier Interactive Patient Education  2019 [employer]  Colonoscopy, Adult, Care After This sheet gives you information about how to care for yourself after your procedure. Your health care provider may also give you more specific instructions. If you have problems or questions, contact your health care provider. What can I expect after the procedure? After the procedure, it is common to have:  A small amount of blood in your stool for 24 hours after the procedure.  Some gas.  Mild abdominal cramping or bloating. Follow these instructions at home: General instructions  For the first 24 hours after the procedure: ? Do not drive or use machinery. ? Do not sign important documents. ? Do not drink alcohol. ? Do your regular daily activities at a slower pace than normal. ? Eat soft, easy-to-digest foods.  Take over-the-counter or prescription medicines only as told by your health care provider. Relieving cramping and bloating   Try walking around when you have cramps or feel bloated.  Apply heat to your abdomen as told by your health care provider. Use a heat source that your health care provider recommends, such as a moist heat pack or a heating pad. ? Place a towel between your skin and the heat source. ? Leave the heat on for 20-30 minutes. ? Remove the heat if your skin turns bright red. This is especially important if you are unable to feel pain, heat, or cold. You may have a greater risk of getting burned. Eating and drinking   Drink enough fluid to keep your urine pale yellow.  Resume your normal diet as instructed by your health care provider. Avoid heavy or fried foods that are hard to digest.  Avoid drinking alcohol for as long as instructed by your health care provider. Contact a health care provider if:  You have blood in your stool 2-3 days after the procedure. Get help right away if:  You have more than a  small spotting of blood in your stool.  You pass large blood clots in your stool.  Your abdomen is swollen.  You have nausea or vomiting.  You have a fever.  You have increasing abdominal pain that is not relieved with medicine. Summary  After the procedure, it is common to have a small amount of blood in your stool. You may also have mild abdominal cramping and bloating.  For the first 24 hours after the procedure, do not drive or use machinery, sign important documents, or drink alcohol.  Contact your health care provider if you have a lot of blood in your stool, nausea or vomiting, a fever, or increased abdominal pain. This information is not intended to replace advice given to you by your health care provider. Make sure you discuss any questions you have with your health care provider. Document Released: 11/03/2003 Document Revised: 01/11/2017 Document Reviewed: 06/02/2015 Elsevier Interactive Patient Education  2019 [place] Anesthesia is a term that refers to techniques, procedures, and medicines that help  a person stay safe and comfortable during a medical procedure. Monitored anesthesia care, or sedation, is one type of anesthesia. Your anesthesia specialist may recommend sedation if you will be having a procedure that does not require you to be unconscious, such as:  Cataract surgery.  A dental procedure.  A biopsy.  A colonoscopy. During the procedure, you may receive a medicine to help you relax (sedative). There are three levels of sedation:  Mild sedation. At this level, you may feel awake and relaxed. You will be able to follow directions.  Moderate sedation. At this level, you will be sleepy. You may not remember the procedure.  Deep sedation. At this level, you will be asleep. You will not remember the procedure. The more medicine you are given, the deeper your level of sedation will be. Depending on how you respond to the  procedure, the anesthesia specialist may change your level of sedation or the type of anesthesia to fit your needs. An anesthesia specialist will monitor you closely during the procedure. Let your health care provider know about:  Any allergies you have.  All medicines you are taking, including vitamins, herbs, eye drops, creams, and over-the-counter medicines.  Any use of steroids (by mouth or as a cream).  Any problems you or family members have had with sedatives and anesthetic medicines.  Any blood disorders you have.  Any surgeries you have had.  Any medical conditions you have, such as sleep apnea.  Whether you are pregnant or may be pregnant.  Any use of cigarettes, alcohol, or street drugs. What are the risks? Generally, this is a safe procedure. However, problems may occur, including:  Getting too much medicine (oversedation).  Nausea.  Allergic reaction to medicines.  Trouble breathing. If this happens, a breathing tube may be used to help with breathing. It will be removed when you are awake and breathing on your own.  Heart trouble.  Lung trouble. Before the procedure Staying hydrated Follow instructions from your health care provider about hydration, which may include:  Up to 2 hours before the procedure - you may continue to drink clear liquids, such as water, clear fruit juice, black coffee, and plain tea. Eating and drinking restrictions Follow instructions from your health care provider about eating and drinking, which may include:  8 hours before the procedure - stop eating heavy meals or foods such as meat, fried foods, or fatty foods.  6 hours before the procedure - stop eating light meals or foods, such as toast or cereal.  6 hours before the procedure - stop drinking milk or drinks that contain milk.  2 hours before the procedure - stop drinking clear liquids. Medicines Ask your health care provider about:  Changing or stopping your regular  medicines. This is especially important if you are taking diabetes medicines or blood thinners.  Taking medicines such as aspirin and ibuprofen. These medicines can thin your blood. Do not take these medicines before your procedure if your health care provider instructs you not to. Tests and exams  You will have a physical exam.  You may have blood tests done to show: ? How well your kidneys and liver are working. ? How well your blood can clot. General instructions  Plan to have someone take you home from the hospital or clinic.  If you will be going home right after the procedure, plan to have someone with you for 24 hours.  What happens during the procedure?  Your blood pressure, heart rate,  breathing, level of pain and overall condition will be monitored.  An IV tube will be inserted into one of your veins.  Your anesthesia specialist will give you medicines as needed to keep you comfortable during the procedure. This may mean changing the level of sedation.  The procedure will be performed. After the procedure  Your blood pressure, heart rate, breathing rate, and blood oxygen level will be monitored until the medicines you were given have worn off.  Do not drive for 24 hours if you received a sedative.  You may: ? Feel sleepy, clumsy, or nauseous. ? Feel forgetful about what happened after the procedure. ? Have a sore throat if you had a breathing tube during the procedure. ? Vomit. This information is not intended to replace advice given to you by your health care provider. Make sure you discuss any questions you have with your health care provider. Document Released: 12/15/2004 Document Revised: 08/28/2015 Document Reviewed: 07/12/2015 Elsevier Interactive Patient Education  2019 [place], Care After These instructions provide you with information about caring for yourself after your procedure. Your health care provider may also give you  more specific instructions. Your treatment has been planned according to current medical practices, but problems sometimes occur. Call your health care provider if you have any problems or questions after your procedure. What can I expect after the procedure? After your procedure, you may:  Feel sleepy for several hours.  Feel clumsy and have poor balance for several hours.  Feel forgetful about what happened after the procedure.  Have poor judgment for several hours.  Feel nauseous or vomit.  Have a sore throat if you had a breathing tube during the procedure. Follow these instructions at home: For at least 24 hours after the procedure:      Have a responsible adult stay with you. It is important to have someone help care for you until you are awake and alert.  Rest as needed.  Do not: ? Participate in activities in which you could fall or become injured. ? Drive. ? Use heavy machinery. ? Drink alcohol. ? Take sleeping pills or medicines that cause drowsiness. ? Make important decisions or sign legal documents. ? Take care of children on your own. Eating and drinking  Follow the diet that is recommended by your health care provider.  If you vomit, drink water, juice, or soup when you can drink without vomiting.  Make sure you have little or no nausea before eating solid foods. General instructions  Take over-the-counter and prescription medicines only as told by your health care provider.  If you have sleep apnea, surgery and certain medicines can increase your risk for breathing problems. Follow instructions from your health care provider about wearing your sleep device: ? Anytime you are sleeping, including during daytime naps. ? While taking prescription pain medicines, sleeping medicines, or medicines that make you drowsy.  If you smoke, do not smoke without supervision.  Keep all follow-up visits as told by your health care provider. This is important. Contact  a health care provider if:  You keep feeling nauseous or you keep vomiting.  You feel light-headed.  You develop a rash.  You have a fever. Get help right away if:  You have trouble breathing. Summary  For several hours after your procedure, you may feel sleepy and have poor judgment.  Have a responsible adult stay with you for at least 24 hours or until you are awake and alert. This  information is not intended to replace advice given to you by your health care provider. Make sure you discuss any questions you have with your health care provider. Document Released: 07/12/2015 Document Revised: 11/04/2016 Document Reviewed: 07/12/2015 Elsevier Interactive Patient Education  2019 [employer].

## 2018-04-18 ENCOUNTER — Ambulatory Visit: Payer: Medicaid Other | Admitting: Urology

## 2018-04-18 DIAGNOSIS — N201 Calculus of ureter: Secondary | ICD-10-CM

## 2018-04-20 ENCOUNTER — Other Ambulatory Visit (HOSPITAL_COMMUNITY): Payer: Self-pay | Admitting: Urology

## 2018-04-20 ENCOUNTER — Encounter (HOSPITAL_COMMUNITY)
Admission: RE | Admit: 2018-04-20 | Discharge: 2018-04-20 | Disposition: A | Discharge status: Home / Self Care | Payer: Medicaid Other | Source: Ambulatory Visit | Attending: Internal Medicine | Admitting: Internal Medicine

## 2018-04-20 ENCOUNTER — Other Ambulatory Visit: Payer: Self-pay | Admitting: Urology

## 2018-04-20 DIAGNOSIS — N201 Calculus of ureter: Secondary | ICD-10-CM

## 2018-04-27 ENCOUNTER — Other Ambulatory Visit: Payer: Self-pay

## 2018-04-27 ENCOUNTER — Ambulatory Visit (HOSPITAL_COMMUNITY)
Admission: RE | Admit: 2018-04-27 | Discharge: 2018-04-27 | Disposition: A | Discharge status: Home / Self Care | Payer: Medicaid Other | Attending: Internal Medicine | Admitting: Internal Medicine

## 2018-04-27 ENCOUNTER — Encounter (HOSPITAL_COMMUNITY)
Admission: RE | Disposition: A | Discharge status: Home / Self Care | Payer: Self-pay | Source: Home / Self Care | Attending: Internal Medicine

## 2018-04-27 ENCOUNTER — Ambulatory Visit (HOSPITAL_COMMUNITY): Payer: Medicaid Other | Admitting: Anesthesiology

## 2018-04-27 ENCOUNTER — Encounter (HOSPITAL_COMMUNITY): Payer: Self-pay

## 2018-04-27 DIAGNOSIS — Z85828 Personal history of other malignant neoplasm of skin: Secondary | ICD-10-CM | POA: Insufficient documentation

## 2018-04-27 DIAGNOSIS — F419 Anxiety disorder, unspecified: Secondary | ICD-10-CM | POA: Insufficient documentation

## 2018-04-27 DIAGNOSIS — J449 Chronic obstructive pulmonary disease, unspecified: Secondary | ICD-10-CM | POA: Diagnosis not present

## 2018-04-27 DIAGNOSIS — Z791 Long term (current) use of non-steroidal anti-inflammatories (NSAID): Secondary | ICD-10-CM | POA: Insufficient documentation

## 2018-04-27 DIAGNOSIS — F319 Bipolar disorder, unspecified: Secondary | ICD-10-CM | POA: Insufficient documentation

## 2018-04-27 DIAGNOSIS — R1314 Dysphagia, pharyngoesophageal phase: Secondary | ICD-10-CM | POA: Diagnosis not present

## 2018-04-27 DIAGNOSIS — Q394 Esophageal web: Secondary | ICD-10-CM | POA: Insufficient documentation

## 2018-04-27 DIAGNOSIS — K219 Gastro-esophageal reflux disease without esophagitis: Secondary | ICD-10-CM | POA: Insufficient documentation

## 2018-04-27 DIAGNOSIS — K5909 Other constipation: Secondary | ICD-10-CM | POA: Insufficient documentation

## 2018-04-27 DIAGNOSIS — E039 Hypothyroidism, unspecified: Secondary | ICD-10-CM | POA: Insufficient documentation

## 2018-04-27 DIAGNOSIS — M797 Fibromyalgia: Secondary | ICD-10-CM | POA: Insufficient documentation

## 2018-04-27 DIAGNOSIS — K5730 Diverticulosis of large intestine without perforation or abscess without bleeding: Secondary | ICD-10-CM

## 2018-04-27 DIAGNOSIS — Z79899 Other long term (current) drug therapy: Secondary | ICD-10-CM | POA: Insufficient documentation

## 2018-04-27 DIAGNOSIS — Z1211 Encounter for screening for malignant neoplasm of colon: Secondary | ICD-10-CM | POA: Diagnosis not present

## 2018-04-27 DIAGNOSIS — F17210 Nicotine dependence, cigarettes, uncomplicated: Secondary | ICD-10-CM | POA: Insufficient documentation

## 2018-04-27 DIAGNOSIS — Z79890 Hormone replacement therapy (postmenopausal): Secondary | ICD-10-CM | POA: Diagnosis not present

## 2018-04-27 DIAGNOSIS — K449 Diaphragmatic hernia without obstruction or gangrene: Secondary | ICD-10-CM | POA: Diagnosis not present

## 2018-04-27 DIAGNOSIS — R1310 Dysphagia, unspecified: Secondary | ICD-10-CM

## 2018-04-27 DIAGNOSIS — R1319 Other dysphagia: Secondary | ICD-10-CM

## 2018-04-27 HISTORY — PX: COLONOSCOPY WITH PROPOFOL: SHX5780

## 2018-04-27 HISTORY — PX: ESOPHAGEAL DILATION: SHX303

## 2018-04-27 HISTORY — PX: ESOPHAGOGASTRODUODENOSCOPY (EGD) WITH PROPOFOL: SHX5813

## 2018-04-27 SURGERY — COLONOSCOPY WITH PROPOFOL
Anesthesia: Monitor Anesthesia Care

## 2018-04-27 MED ORDER — PROPOFOL 10 MG/ML IV BOLUS
INTRAVENOUS | Status: AC
  Filled 2018-04-27: qty 40

## 2018-04-27 MED ORDER — NALOXEGOL OXALATE 25 MG PO TABS: 25.0000 mg | ORAL_TABLET | Freq: Every day | ORAL | 5 refills | Status: DC

## 2018-04-27 MED ORDER — LACTATED RINGERS IV SOLN
INTRAVENOUS | Status: DC
  Administered 2018-04-27: 07:00:00 via INTRAVENOUS

## 2018-04-27 MED ORDER — CHLORHEXIDINE GLUCONATE CLOTH 2 % EX PADS: 6.0000 | MEDICATED_PAD | Freq: Once | CUTANEOUS | Status: DC

## 2018-04-27 MED ORDER — LIDOCAINE HCL 1 % IJ SOLN
INTRAMUSCULAR | Status: DC | PRN
  Administered 2018-04-27: 50 mg via INTRADERMAL

## 2018-04-27 MED ORDER — SIMETHICONE 40 MG/0.6ML PO SUSP
ORAL | Status: AC
  Filled 2018-04-27: qty 0.6

## 2018-04-27 MED ORDER — PROPOFOL 10 MG/ML IV BOLUS
INTRAVENOUS | Status: AC
  Filled 2018-04-27: qty 20

## 2018-04-27 MED ORDER — PROPOFOL 500 MG/50ML IV EMUL
INTRAVENOUS | Status: DC | PRN
  Administered 2018-04-27: 100 ug/kg/min via INTRAVENOUS
  Administered 2018-04-27: 08:00:00 via INTRAVENOUS

## 2018-04-27 MED ORDER — LIDOCAINE HCL (PF) 1 % IJ SOLN
INTRAMUSCULAR | Status: AC
  Filled 2018-04-27: qty 5

## 2018-04-27 MED ORDER — PROPOFOL 10 MG/ML IV BOLUS
INTRAVENOUS | Status: DC | PRN
  Administered 2018-04-27 (×4): 20 mg via INTRAVENOUS
  Administered 2018-04-27 (×2): 30 mg via INTRAVENOUS
  Administered 2018-04-27: 20 mg via INTRAVENOUS

## 2018-04-27 MED ORDER — MIDAZOLAM HCL 2 MG/2ML IJ SOLN
INTRAMUSCULAR | Status: AC
  Filled 2018-04-27: qty 2

## 2018-04-27 MED ORDER — MIDAZOLAM HCL 5 MG/5ML IJ SOLN
INTRAMUSCULAR | Status: DC | PRN
  Administered 2018-04-27: 2 mg via INTRAVENOUS

## 2018-04-27 NOTE — Anesthesia Postprocedure Evaluation (Signed)
Anesthesia Post Note  Patient: Meredith [name]  Procedure(s) Performed: COLONOSCOPY WITH PROPOFOL (N/A ) ESOPHAGOGASTRODUODENOSCOPY (EGD) WITH PROPOFOL (N/A ) ESOPHAGEAL DILATION (N/A )  Patient location during evaluation: PACU Anesthesia Type: MAC Level of consciousness: awake and alert and oriented Pain management: pain level controlled Vital Signs Assessment: post-procedure vital signs reviewed and stable Respiratory status: spontaneous breathing Cardiovascular status: blood pressure returned to baseline and stable Postop Assessment: no apparent nausea or vomiting Anesthetic complications: no     Last Vitals:  Vitals:   04/27/18 0642 04/27/18 0815  BP: 118/70 113/71  Pulse: 83 89  Resp: 16 16  Temp: 36.7 C 36.4 C  SpO2: 93% 99%    Last Pain:  Vitals:   04/27/18 0815  TempSrc:   PainSc: (P) 0-No pain                 Dequann Vandervelden
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
## 2018-04-27 NOTE — Op Note (Signed)
[facility] Patient Name: Meredith [name] Procedure Date: 04/27/2018 7:51 AM MRN: [id number] Date of Birth: December 25, 1965 Attending MD: Hildred Laser , MD CSN: [id number] Age: 53 Admit Type: Outpatient Procedure:                Colonoscopy Indications:              Screening for colorectal malignant neoplasm Providers:                Hildred Laser, MD, Gwenlyn Fudge RN, RN, Gerome Sam, RN, Randa Spike, Technician Referring MD:             Teodora Medici. Kim Medicines:                Propofol per Anesthesia Complications:            No immediate complications. Estimated Blood Loss:     Estimated blood loss: none. Procedure:                Pre-Anesthesia Assessment:                           - Prior to the procedure, a History and Physical                            was performed, and patient medications and                            allergies were reviewed. The patient's tolerance of                            previous anesthesia was also reviewed. The risks                            and benefits of the procedure and the sedation                            options and risks were discussed with the patient.                            All questions were answered, and informed consent                            was obtained. Prior Anticoagulants: The patient                            last took previous NSAID medication 1 day prior to                            the procedure. ASA Grade Assessment: III - A                            patient with severe systemic disease. After  reviewing the risks and benefits, the patient was                            deemed in satisfactory condition to undergo the                            procedure.                           After obtaining informed consent, the colonoscope                            was passed under direct vision. Throughout the                            procedure, the patient's  blood pressure, pulse, and                            oxygen saturations were monitored continuously. The                            [id number]-[id number] ([id number]) scope was introduced through                            the anus and advanced to the the cecum, identified                            by appendiceal orifice and ileocecal valve. The                            colonoscopy was performed without difficulty. The                            patient tolerated the procedure well. The quality                            of the bowel preparation was excellent. The                            ileocecal valve, appendiceal orifice, and rectum                            were photographed. Scope In: 7:50:26 AM Scope Out: 8:09:00 AM Scope Withdrawal Time: 0 hours 8 minutes 22 seconds  Total Procedure Duration: 0 hours 18 minutes 34 seconds  Findings:      The perianal and digital rectal examinations were normal.      A few medium-mouthed diverticula were found in the sigmoid colon and       ascending colon.      The exam was otherwise normal throughout the examined colon.      The retroflexed view of the distal rectum and anal verge was normal and       showed no anal or rectal abnormalities. Impression:               - Diverticulosis in the sigmoid  colon and in the                            ascending colon.                           - No specimens collected. Moderate Sedation:      Per Anesthesia Care Recommendation:           - Patient has a contact number available for                            emergencies. The signs and symptoms of potential                            delayed complications were discussed with the                            patient. Return to normal activities tomorrow.                            Written discharge instructions were provided to the                            patient.                           - High fiber diet today.                           - Continue present  medications.                           - Repeat colonoscopy in 10 years for screening                            purposes. Procedure Code(s):        --- Professional ---                           [phone], Colonoscopy, flexible; diagnostic, including                            collection of specimen(s) by brushing or washing,                            when performed (separate procedure) Diagnosis Code(s):        --- Professional ---                           Z12.11, Encounter for screening for malignant                            neoplasm of colon                           K57.30, Diverticulosis of large intestine without  perforation or abscess without bleeding CPT copyright 2018 [employer]. All rights reserved. The codes documented in this report are preliminary and upon coder review may  be revised to meet current compliance requirements. Hildred Laser, MD Hildred Laser, MD 04/27/2018 8:27:10 AM This report has been signed electronically. Number of Addenda: 0

## 2018-04-27 NOTE — Discharge Instructions (Signed)
No aspirin or NSAIDs for 24 hours. Resume other medications as before. High-fiber diet. No driving for 24 hours. Please call office with progress report in 1 week regarding swallowing difficulty.

## 2018-04-27 NOTE — Op Note (Signed)
[facility] Patient Name: Meredith [name] Procedure Date: 04/27/2018 7:15 AM MRN: [id number] Date of Birth: Aug 19, 1965 Attending MD: Hildred Laser , MD CSN: [id number] Age: 53 Admit Type: Outpatient Procedure:                Upper GI endoscopy Indications:              Esophageal dysphagia Providers:                Hildred Laser, MD, Jeanann Lewandowsky. Sharon Seller, RN, Gerome Sam, RN, Randa Spike, Technician Referring MD:             Arlyss Repress, MD Medicines:                Propofol per Anesthesia Complications:            No immediate complications. Estimated Blood Loss:     Estimated blood loss was minimal. Procedure:                Pre-Anesthesia Assessment:                           - Prior to the procedure, a History and Physical                            was performed, and patient medications and                            allergies were reviewed. The patient's tolerance of                            previous anesthesia was also reviewed. The risks                            and benefits of the procedure and the sedation                            options and risks were discussed with the patient.                            All questions were answered, and informed consent                            was obtained. Prior Anticoagulants: The patient                            last took previous NSAID medication 1 day prior to                            the procedure. ASA Grade Assessment: III - A                            patient with severe systemic disease. After  reviewing the risks and benefits, the patient was                            deemed in satisfactory condition to undergo the                            procedure.                           After obtaining informed consent, the endoscope was                            passed under direct vision. Throughout the                            procedure, the patient's  blood pressure, pulse, and                            oxygen saturations were monitored continuously. The                            [id number]-[id number] ([id number]) was introduced through the and                            advanced to the second part of duodenum. The upper                            GI endoscopy was accomplished without difficulty.                            The patient tolerated the procedure well. Scope In: 7:39:00 AM Scope Out: 7:45:32 AM Total Procedure Duration: 0 hours 6 minutes 32 seconds  Findings:      A web was found in the proximal esophagus. Dilation performed by passing       56 Fr. dilator. The dilation site was examined following endoscope       reinsertion and showed small linear mucosal disruption and no       perforation.      The exam of the esophagus was otherwise normal.      The Z-line was irregular and was found 35 cm from the incisors.      A 2 cm hiatal hernia was present.      The entire examined stomach was normal.      The duodenal bulb and second portion of the duodenum were normal. Impression:               - Web in the proximal esophagus. Dilated                           - Z-line irregular, 35 cm from the incisors.                           - 2 cm hiatal hernia.                           - Normal stomach.                           -  Normal duodenal bulb and second portion of the                            duodenum.                           - No specimens collected. Moderate Sedation:      Per Anesthesia Care Recommendation:           - Patient has a contact number available for                            emergencies. The signs and symptoms of potential                            delayed complications were discussed with the                            patient. Return to normal activities tomorrow.                            Written discharge instructions were provided to the                            patient.                           - Patient  has a contact number available for                            emergencies. The signs and symptoms of potential                            delayed complications were discussed with the                            patient. Return to normal activities tomorrow.                            Written discharge instructions were provided to the                            patient.                           - Resume previous diet today.                           - Continue present medications.                           - No aspirin, ibuprofen, naproxen, or other                            non-steroidal anti-inflammatory drugs for 1 day.                           -  Telephone GI clinic in 1 week. Procedure Code(s):        --- Professional ---                           [phone], Esophagogastroduodenoscopy, flexible,                            transoral; diagnostic, including collection of                            specimen(s) by brushing or washing, when performed                            (separate procedure) Diagnosis Code(s):        --- Professional ---                           Q39.4, Esophageal web                           K22.8, Other specified diseases of esophagus                           K44.9, Diaphragmatic hernia without obstruction or                            gangrene                           R13.14, Dysphagia, pharyngoesophageal phase CPT copyright 2018 [employer]. All rights reserved. The codes documented in this report are preliminary and upon coder review may  be revised to meet current compliance requirements. Hildred Laser, MD Hildred Laser, MD 04/27/2018 8:24:17 AM This report has been signed electronically. Number of Addenda: 0

## 2018-04-27 NOTE — Transfer of Care (Signed)
Immediate Anesthesia Transfer of Care Note  Patient: Meredith [name]  Procedure(s) Performed: COLONOSCOPY WITH PROPOFOL (N/A ) ESOPHAGOGASTRODUODENOSCOPY (EGD) WITH PROPOFOL (N/A ) ESOPHAGEAL DILATION (N/A )  Patient Location: PACU  Anesthesia Type:MAC  Level of Consciousness: awake and alert   Airway & Oxygen Therapy: Patient Spontanous Breathing  Post-op Assessment: Report given to RN  Post vital signs: Reviewed and stable  Last Vitals:  Vitals Value Taken Time  BP 113/71 04/27/2018  8:15 AM  Temp 36.4 C 04/27/2018  8:15 AM  Pulse 88 04/27/2018  8:18 AM  Resp 23 04/27/2018  8:18 AM  SpO2 100 % 04/27/2018  8:18 AM  Vitals shown include unvalidated device data.  Last Pain:  Vitals:   04/27/18 0815  TempSrc:   PainSc: (P) 0-No pain      Patients Stated Pain Goal: (P) 7 (44/17/12 7871)  Complications: No apparent anesthesia complications

## 2018-04-27 NOTE — Anesthesia Preprocedure Evaluation (Signed)
Anesthesia Evaluation  Patient identified by MRN, date of birth, ID band Patient awake    Reviewed: Allergy & Precautions, H&P , NPO status , Patient's Chart, lab work & pertinent test results  Airway Mallampati: II  TM Distance: >3 FB Neck ROM: full    Dental no notable dental hx.    Pulmonary asthma , pneumonia, COPD, Current Smoker,    Pulmonary exam normal breath sounds clear to auscultation       Cardiovascular Exercise Tolerance: Good negative cardio ROS   Rhythm:regular Rate:Normal     Neuro/Psych PSYCHIATRIC DISORDERS Anxiety Depression Bipolar Disorder Schizophrenia  Neuromuscular disease    GI/Hepatic Neg liver ROS, GERD  ,  Endo/Other  Hypothyroidism   Renal/GU negative Renal ROS  negative genitourinary   Musculoskeletal   Abdominal   Peds  Hematology negative hematology ROS (+)   Anesthesia Other Findings Paranoid schizophrenia ([place]) Bipolar disorder, unspecified ([place]) Generalized anxiety disorder    Reproductive/Obstetrics negative OB ROS                             Anesthesia Physical Anesthesia Plan  ASA: III  Anesthesia Plan: MAC   Post-op Pain Management:    Induction:   PONV Risk Score and Plan:   Airway Management Planned:   Additional Equipment:   Intra-op Plan:   Post-operative Plan:   Informed Consent: I have reviewed the patients History and Physical, chart, labs and discussed the procedure including the risks, benefits and alternatives for the proposed anesthesia with the patient or authorized representative who has indicated his/her understanding and acceptance.     Dental Advisory Given  Plan Discussed with: CRNA  Anesthesia Plan Comments:         Anesthesia Quick Evaluation

## 2018-05-01 ENCOUNTER — Telehealth (INDEPENDENT_AMBULATORY_CARE_PROVIDER_SITE_OTHER): Payer: Self-pay | Admitting: Internal Medicine

## 2018-05-01 ENCOUNTER — Encounter (HOSPITAL_COMMUNITY): Payer: Self-pay | Admitting: Internal Medicine

## 2018-05-01 NOTE — Telephone Encounter (Signed)
Patient called and gave progress report - stated she is doing much better and thank you so much.

## 2018-05-03 ENCOUNTER — Ambulatory Visit (HOSPITAL_COMMUNITY): Admission: RE | Admit: 2018-05-03 | Payer: Medicaid Other | Source: Ambulatory Visit

## 2018-05-03 ENCOUNTER — Encounter (HOSPITAL_COMMUNITY): Payer: Self-pay

## 2018-05-04 ENCOUNTER — Ambulatory Visit (HOSPITAL_COMMUNITY)
Admission: RE | Admit: 2018-05-04 | Discharge: 2018-05-04 | Disposition: A | Discharge status: Home / Self Care | Payer: Medicaid Other | Source: Ambulatory Visit | Attending: Urology | Admitting: Urology

## 2018-05-04 DIAGNOSIS — N201 Calculus of ureter: Secondary | ICD-10-CM

## 2018-05-07 NOTE — Telephone Encounter (Signed)
Glad to note that patient is feeling better and able to swallow better

## 2018-06-04 NOTE — Telephone Encounter (Signed)
Note sent to nurse. 

## 2018-07-09 ENCOUNTER — Telehealth: Payer: Self-pay | Admitting: Obstetrics and Gynecology

## 2018-07-09 NOTE — Telephone Encounter (Signed)
Patient called, stated that she'd like to make an appointment for a cyst on her inner thigh.  She thinks it needs to be lanced.  She stated that it has been there for a couple of weeks, she got some stuff out of it a few days ago.  She stated it's infected and painful to the touch.  [employer]  [phone]

## 2018-07-09 NOTE — Telephone Encounter (Signed)
Left message letting pt know she will need to schedule an appt either with her PCP or if they can't see her, with [place]. [place]

## 2018-07-11 ENCOUNTER — Telehealth: Payer: Self-pay

## 2018-07-11 NOTE — Telephone Encounter (Signed)
Received PA form for Dexilant 60 mg. PA wasn't completed. Pt has been discharged from practice.

## 2018-07-17 ENCOUNTER — Other Ambulatory Visit: Payer: Self-pay

## 2018-07-17 ENCOUNTER — Encounter (INDEPENDENT_AMBULATORY_CARE_PROVIDER_SITE_OTHER): Payer: Self-pay | Admitting: Internal Medicine

## 2018-07-17 ENCOUNTER — Ambulatory Visit (INDEPENDENT_AMBULATORY_CARE_PROVIDER_SITE_OTHER): Payer: Medicaid Other | Admitting: Internal Medicine

## 2018-07-17 DIAGNOSIS — K5903 Drug induced constipation: Secondary | ICD-10-CM | POA: Diagnosis not present

## 2018-07-17 DIAGNOSIS — K219 Gastro-esophageal reflux disease without esophagitis: Secondary | ICD-10-CM

## 2018-07-17 DIAGNOSIS — R1310 Dysphagia, unspecified: Secondary | ICD-10-CM

## 2018-07-17 DIAGNOSIS — R1319 Other dysphagia: Secondary | ICD-10-CM

## 2018-07-17 DIAGNOSIS — T402X5A Adverse effect of other opioids, initial encounter: Secondary | ICD-10-CM | POA: Diagnosis not present

## 2018-07-17 MED ORDER — POLYETHYLENE GLYCOL 3350 17 GM/SCOOP PO POWD: 17.0000 g | Freq: Every day | ORAL | 5 refills | Status: DC

## 2018-07-17 MED ORDER — ESOMEPRAZOLE MAGNESIUM 40 MG PO CPDR: 40.0000 mg | DELAYED_RELEASE_CAPSULE | Freq: Every day | ORAL | 5 refills | Status: DC

## 2018-07-17 NOTE — Progress Notes (Addendum)
Virtual Visit via Telephone Note  I connected with Meredith [name] on 07/17/18 at  9:00 AM EDT by telephone and verified that I am speaking with the correct person using two identifiers.   I discussed the limitations, risks, security and privacy concerns of performing an evaluation and management service by telephone and the availability of in person appointments. I also discussed with the patient that there may be a patient responsible charge related to this service. The patient expressed understanding and agreed to proceed telephone visit, she is unable to do video visit. Meredith [name] is at home and I am in the office. Thomas Hoff, LPN is present for telephone visit.   Patient had scheduled visit which was canceled because of ongoing COVID-19 pandemic patient requested telephone visit and I agreed.   History of Present Illness:  Patient is 53 year old Caucasian female with multiple medical problems who has chronic GERD history of dysphagia as well as constipation who underwent EGD with ED as well as colonoscopy on 04/27/2018.  She was noted to have esophageal valve.  Patient was begun on dexlansoprazole.  Patient states she was doing well as far as heartburn and regurgitation is concerned while she was on Dexlansoprazole.  This medication is not preferred on her plan and therefore she was not able to fill it.  Her symptoms are back.  She is having daily heartburn.  She tried pantoprazole but it did not work.  She says she is having dysphagia with trazodone but generally not having difficulty with solids or other pills. She continues to complain of constipation.  She is not getting relief with Movantik alone.  She had good results while she was taking polyethylene glycol along with this medication and she needs a refill.  Medication list was reviewed with the patient.    Observations/Objective:  Patient did not appear to be in any distress during phone conversation.  Assessment and Plan:  #1.   Chronic GERD.  PPI did not work and dexlansoprazole is not preferred drug under her plan.  Will try her on another PPI and see how she does. Prescription for Esomeprazole 40 mg p.o. twice daily sent to patient's pharmacy.  #2.  Esophageal dysphagia.  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  She is not having dysphagia with trazodone and not with food.  She may want to talk with her physician about changing the dose so that she could take 350 mg tablets instead of 1 large pill.  #3.  Chronic constipation.  Will add polyethylene glycol to her regiment.   Follow Up Instructions:    Patient will call office if he Esomeprazole does not work. Office visit in 6 months.  I discussed the assessment and treatment plan with the patient. The patient was provided an opportunity to ask questions and all were answered. The patient agreed with the plan and demonstrated an understanding of the instructions.   The patient was advised to call back or seek an in-person evaluation if the symptoms worsen or if the condition fails to improve as anticipated.  I provided 10 minutes of non-face-to-face time during this encounter.   Hildred Laser, MD

## 2018-07-17 NOTE — Patient Instructions (Signed)
Patient advised to call office if Esomeprazole does not work.

## 2018-09-06 ENCOUNTER — Ambulatory Visit (HOSPITAL_COMMUNITY): Payer: Medicaid Other

## 2018-09-06 ENCOUNTER — Other Ambulatory Visit (HOSPITAL_COMMUNITY): Payer: Self-pay | Admitting: Urology

## 2018-09-06 ENCOUNTER — Other Ambulatory Visit: Payer: Self-pay | Admitting: Urology

## 2018-09-06 DIAGNOSIS — N200 Calculus of kidney: Secondary | ICD-10-CM

## 2018-09-06 DIAGNOSIS — N201 Calculus of ureter: Secondary | ICD-10-CM

## 2018-09-27 ENCOUNTER — Other Ambulatory Visit (INDEPENDENT_AMBULATORY_CARE_PROVIDER_SITE_OTHER): Payer: Self-pay | Admitting: Internal Medicine

## 2018-10-03 ENCOUNTER — Telehealth: Payer: Self-pay | Admitting: *Deleted

## 2018-10-03 NOTE — Telephone Encounter (Signed)
Called office requesting to be seen today with reports of intermittent heart fluttering, some dizziness & sob and some intermittent chest pain rated 6/10 for the past few days. Reports spitting up blood (mouth full) x's 1 today. Did not take anything for the chest pain. Denies active chest. Advised that she needed to contact her PCP r/e spitting up blood and first available appointment given to patient 11/02/18 @2 :30 pm to see [place] at the [place] office. Advised if symptoms got worse, that she needed to go to the ED for an evaluation. Verbalized understanding of plan.

## 2018-10-04 ENCOUNTER — Other Ambulatory Visit: Payer: Self-pay

## 2018-10-04 ENCOUNTER — Emergency Department (HOSPITAL_COMMUNITY)
Admission: EM | Admit: 2018-10-04 | Discharge: 2018-10-04 | Disposition: A | Discharge status: Other Acute Inpatient Hospital | Payer: Medicaid Other | Attending: Emergency Medicine | Admitting: Emergency Medicine

## 2018-10-04 ENCOUNTER — Inpatient Hospital Stay (HOSPITAL_COMMUNITY)
Admission: AD | Admit: 2018-10-04 | Discharge: 2018-10-09 | DRG: 885 | Disposition: A | Discharge status: Home / Self Care | Payer: Medicaid Other | Attending: Psychiatry | Admitting: Psychiatry

## 2018-10-04 ENCOUNTER — Other Ambulatory Visit: Payer: Self-pay | Admitting: Behavioral Health

## 2018-10-04 ENCOUNTER — Encounter (HOSPITAL_COMMUNITY): Payer: Self-pay

## 2018-10-04 ENCOUNTER — Encounter (HOSPITAL_COMMUNITY): Payer: Self-pay | Admitting: Emergency Medicine

## 2018-10-04 DIAGNOSIS — C4491 Basal cell carcinoma of skin, unspecified: Secondary | ICD-10-CM | POA: Diagnosis not present

## 2018-10-04 DIAGNOSIS — Z79899 Other long term (current) drug therapy: Secondary | ICD-10-CM | POA: Insufficient documentation

## 2018-10-04 DIAGNOSIS — J45909 Unspecified asthma, uncomplicated: Secondary | ICD-10-CM | POA: Diagnosis not present

## 2018-10-04 DIAGNOSIS — Z20828 Contact with and (suspected) exposure to other viral communicable diseases: Secondary | ICD-10-CM | POA: Diagnosis not present

## 2018-10-04 DIAGNOSIS — F251 Schizoaffective disorder, depressive type: Secondary | ICD-10-CM | POA: Insufficient documentation

## 2018-10-04 DIAGNOSIS — F17210 Nicotine dependence, cigarettes, uncomplicated: Secondary | ICD-10-CM | POA: Diagnosis present

## 2018-10-04 DIAGNOSIS — I10 Essential (primary) hypertension: Secondary | ICD-10-CM | POA: Diagnosis present

## 2018-10-04 DIAGNOSIS — R443 Hallucinations, unspecified: Secondary | ICD-10-CM | POA: Diagnosis present

## 2018-10-04 DIAGNOSIS — J449 Chronic obstructive pulmonary disease, unspecified: Secondary | ICD-10-CM | POA: Diagnosis not present

## 2018-10-04 DIAGNOSIS — F29 Unspecified psychosis not due to a substance or known physiological condition: Secondary | ICD-10-CM

## 2018-10-04 DIAGNOSIS — K210 Gastro-esophageal reflux disease with esophagitis: Secondary | ICD-10-CM | POA: Diagnosis present

## 2018-10-04 DIAGNOSIS — E039 Hypothyroidism, unspecified: Secondary | ICD-10-CM | POA: Insufficient documentation

## 2018-10-04 DIAGNOSIS — F250 Schizoaffective disorder, bipolar type: Secondary | ICD-10-CM | POA: Diagnosis not present

## 2018-10-04 DIAGNOSIS — F259 Schizoaffective disorder, unspecified: Secondary | ICD-10-CM | POA: Diagnosis present

## 2018-10-04 LAB — RAPID URINE DRUG SCREEN, HOSP PERFORMED
Amphetamines: NOT DETECTED
Barbiturates: NOT DETECTED
Benzodiazepines: NOT DETECTED
Cocaine: NOT DETECTED
Opiates: NOT DETECTED
Tetrahydrocannabinol: NOT DETECTED

## 2018-10-04 LAB — CBC
HCT: 47.9 % — ABNORMAL HIGH (ref 36.0–46.0)
Hemoglobin: 15.1 g/dL — ABNORMAL HIGH (ref 12.0–15.0)
MCH: 29.2 pg (ref 26.0–34.0)
MCHC: 31.5 g/dL (ref 30.0–36.0)
MCV: 92.6 fL (ref 80.0–100.0)
Platelets: 313 10*3/uL (ref 150–400)
RBC: 5.17 MIL/uL — ABNORMAL HIGH (ref 3.87–5.11)
RDW: 15.6 % — ABNORMAL HIGH (ref 11.5–15.5)
WBC: 9.5 10*3/uL (ref 4.0–10.5)
nRBC: 0 % (ref 0.0–0.2)

## 2018-10-04 LAB — COMPREHENSIVE METABOLIC PANEL
ALT: 11 U/L (ref 0–44)
AST: 14 U/L — ABNORMAL LOW (ref 15–41)
Albumin: 4.1 g/dL (ref 3.5–5.0)
Alkaline Phosphatase: 87 U/L (ref 38–126)
Anion gap: 15 (ref 5–15)
BUN: 12 mg/dL (ref 6–20)
CO2: 25 mmol/L (ref 22–32)
Calcium: 9.6 mg/dL (ref 8.9–10.3)
Chloride: 97 mmol/L — ABNORMAL LOW (ref 98–111)
Creatinine, Ser: 0.84 mg/dL (ref 0.44–1.00)
GFR calc Af Amer: >60 mL/min (ref >60)
GFR calc non Af Amer: >60 mL/min (ref >60)
Glucose, Bld: 117 mg/dL — ABNORMAL HIGH (ref 70–99)
Potassium: 3.2 mmol/L — ABNORMAL LOW (ref 3.5–5.1)
Sodium: 137 mmol/L (ref 135–145)
Total Bilirubin: 0.5 mg/dL (ref 0.3–1.2)
Total Protein: 7.2 g/dL (ref 6.5–8.1)

## 2018-10-04 LAB — SARS CORONAVIRUS 2 BY RT PCR (HOSPITAL ORDER, PERFORMED IN ~~LOC~~ HOSPITAL LAB): SARS Coronavirus 2: NEGATIVE

## 2018-10-04 LAB — POC URINE PREG, ED: Preg Test, Ur: NEGATIVE

## 2018-10-04 LAB — ETHANOL: Alcohol, Ethyl (B): <10 mg/dL (ref <10)

## 2018-10-04 MED ORDER — LURASIDONE HCL 40 MG PO TABS
120.0000 mg | ORAL_TABLET | Freq: Every day | ORAL | Status: DC
  Filled 2018-10-04: qty 3

## 2018-10-04 MED ORDER — ACETAMINOPHEN 325 MG PO TABS
650.0000 mg | ORAL_TABLET | Freq: Four times a day (QID) | ORAL | Status: DC | PRN
  Administered 2018-10-07 – 2018-10-08 (×2): 650 mg via ORAL
  Filled 2018-10-04: qty 2

## 2018-10-04 MED ORDER — NALOXEGOL OXALATE 25 MG PO TABS
25.0000 mg | ORAL_TABLET | Freq: Every day | ORAL | Status: DC
  Filled 2018-10-04: qty 1

## 2018-10-04 MED ORDER — FUROSEMIDE 40 MG PO TABS
20.0000 mg | ORAL_TABLET | Freq: Every day | ORAL | Status: DC
  Administered 2018-10-04: 40 mg via ORAL
  Filled 2018-10-04: qty 1

## 2018-10-04 MED ORDER — LORAZEPAM 1 MG PO TABS: 1.0000 mg | ORAL_TABLET | ORAL | Status: DC | PRN

## 2018-10-04 MED ORDER — PANTOPRAZOLE SODIUM 40 MG PO TBEC: 80.0000 mg | DELAYED_RELEASE_TABLET | Freq: Every day | ORAL | Status: DC

## 2018-10-04 MED ORDER — LEVOTHYROXINE SODIUM 50 MCG PO TABS
25.0000 ug | ORAL_TABLET | Freq: Every day | ORAL | Status: DC
  Administered 2018-10-04: 25 ug via ORAL
  Filled 2018-10-04: qty 1

## 2018-10-04 MED ORDER — PREGABALIN 50 MG PO CAPS
50.0000 mg | ORAL_CAPSULE | Freq: Three times a day (TID) | ORAL | Status: DC
  Administered 2018-10-04: 50 mg via ORAL
  Filled 2018-10-04: qty 1

## 2018-10-04 MED ORDER — MONTELUKAST SODIUM 10 MG PO TABS: 10.0000 mg | ORAL_TABLET | Freq: Every day | ORAL | Status: DC

## 2018-10-04 MED ORDER — MOMETASONE FURO-FORMOTEROL FUM 200-5 MCG/ACT IN AERO
2.0000 | INHALATION_SPRAY | RESPIRATORY_TRACT | Status: DC
  Filled 2018-10-04: qty 8.8

## 2018-10-04 MED ORDER — UMECLIDINIUM BROMIDE 62.5 MCG/INH IN AEPB
1.0000 | INHALATION_SPRAY | Freq: Every day | RESPIRATORY_TRACT | Status: DC
  Filled 2018-10-04: qty 7

## 2018-10-04 MED ORDER — OLANZAPINE 5 MG PO TBDP: 10.0000 mg | ORAL_TABLET | Freq: Three times a day (TID) | ORAL | Status: DC | PRN

## 2018-10-04 MED ORDER — TRAZODONE HCL 50 MG PO TABS: 150.0000 mg | ORAL_TABLET | Freq: Every day | ORAL | Status: DC

## 2018-10-04 MED ORDER — ALUM & MAG HYDROXIDE-SIMETH 200-200-20 MG/5ML PO SUSP: 30.0000 mL | ORAL | Status: DC | PRN

## 2018-10-04 MED ORDER — TIOTROPIUM BROMIDE MONOHYDRATE 18 MCG IN CAPS: 18.0000 ug | ORAL_CAPSULE | Freq: Every day | RESPIRATORY_TRACT | Status: DC

## 2018-10-04 MED ORDER — NICOTINE 21 MG/24HR TD PT24
21.0000 mg | MEDICATED_PATCH | Freq: Every day | TRANSDERMAL | Status: DC
  Filled 2018-10-04 (×7): qty 1

## 2018-10-04 MED ORDER — ZIPRASIDONE MESYLATE 20 MG IM SOLR: 20.0000 mg | INTRAMUSCULAR | Status: DC | PRN

## 2018-10-04 MED ORDER — ALBUTEROL SULFATE HFA 108 (90 BASE) MCG/ACT IN AERS: 2.0000 | INHALATION_SPRAY | Freq: Four times a day (QID) | RESPIRATORY_TRACT | Status: DC | PRN

## 2018-10-04 MED ORDER — BUPRENORPHINE HCL-NALOXONE HCL 8-2 MG SL SUBL
1.0000 | SUBLINGUAL_TABLET | Freq: Two times a day (BID) | SUBLINGUAL | Status: DC
  Administered 2018-10-04: 1 via SUBLINGUAL
  Filled 2018-10-04: qty 1

## 2018-10-04 NOTE — ED Notes (Signed)
Pt wanted speak to this RN. Pt requesting to leave and wants her belongings. Informed patient that she is IVC and cannot leave. Security notified.

## 2018-10-04 NOTE — ED Triage Notes (Signed)
Pt stating "these people have these machines and they are cutting holes in my heart." Pt also stating "they have went in and moved my pituitary gland as well." Pt states she knows who is doing this and "they want me to die." Pt also stating she "has bled out all day yesterday."
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
## 2018-10-04 NOTE — ED Notes (Signed)
Room secured, pt changed into scrubs & wanded by security  Personal belongings locked in locker consisted of shoes, jewelry, and clothing

## 2018-10-04 NOTE — ED Notes (Signed)
RT came down to administer inhalers. Medications not currently in pyxis. RT stated she will notify pharmacy.
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
## 2018-10-04 NOTE — [facility] Counselor (Signed)
  [place] ASSESSMENT Disposition:   [facility] discussed case with [place] Provider, [name], NP who recommends inpatient treatment.  TTS will look for inpatient placement.  Ameria Sanjurjo L. [place], [place], [facility], [facility] Therapeutic Triage Specialist  [phone]

## 2018-10-04 NOTE — Progress Notes (Signed)
Pt accepted to [place], Bed 501-1 Meredith Maes, NP is the accepting provider.  Meredith Hai, MD is the attending provider.  Call report to [phone]  Meredith [name]@ [email or web address] notified.   Pt is IVC  Pt may be transported by [employer] Pt scheduled  to arrive at [facility]@1800   Meredith Gahm T. [facility], MSW, [place] Disposition Clinical Social Work [phone] (cell) [phone] (office)

## 2018-10-04 NOTE — ED Notes (Signed)
[employer] officer arrived and is at bedside. Officer given paperwork and pt's belongings.
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
## 2018-10-05 DIAGNOSIS — F250 Schizoaffective disorder, bipolar type: Secondary | ICD-10-CM

## 2018-10-05 LAB — LITHIUM LEVEL: Lithium Lvl: 0.2 mmol/L — ABNORMAL LOW (ref 0.60–1.20)

## 2018-10-05 LAB — TSH: TSH: 3.005 u[IU]/mL (ref 0.350–4.500)

## 2018-10-05 LAB — LIPID PANEL
Cholesterol: 200 mg/dL (ref 0–200)
HDL: 39 mg/dL — ABNORMAL LOW (ref >40)
LDL Cholesterol: 136 mg/dL — ABNORMAL HIGH (ref 0–99)
Total CHOL/HDL Ratio: 5.1 RATIO
Triglycerides: 126 mg/dL (ref <150)
VLDL: 25 mg/dL (ref 0–40)

## 2018-10-05 LAB — HEMOGLOBIN A1C
Hgb A1c MFr Bld: 5.6 % (ref 4.8–5.6)
Mean Plasma Glucose: 114.02 mg/dL

## 2018-10-05 MED ORDER — FUROSEMIDE 20 MG PO TABS
20.0000 mg | ORAL_TABLET | Freq: Every day | ORAL | Status: DC
  Administered 2018-10-05 – 2018-10-08 (×4): 20 mg via ORAL
  Filled 2018-10-05 (×6): qty 1

## 2018-10-05 MED ORDER — CLONIDINE HCL 0.1 MG PO TABS
0.1000 mg | ORAL_TABLET | Freq: Two times a day (BID) | ORAL | Status: DC
  Administered 2018-10-05 – 2018-10-07 (×5): 0.1 mg via ORAL
  Filled 2018-10-05 (×8): qty 1

## 2018-10-05 MED ORDER — PREGABALIN 100 MG PO CAPS
100.0000 mg | ORAL_CAPSULE | Freq: Three times a day (TID) | ORAL | Status: DC
  Administered 2018-10-05 – 2018-10-09 (×13): 100 mg via ORAL
  Filled 2018-10-05 (×13): qty 1

## 2018-10-05 MED ORDER — ALBUTEROL SULFATE HFA 108 (90 BASE) MCG/ACT IN AERS
2.0000 | INHALATION_SPRAY | Freq: Four times a day (QID) | RESPIRATORY_TRACT | Status: DC | PRN
  Administered 2018-10-07: 2 via RESPIRATORY_TRACT
  Filled 2018-10-05: qty 6.7

## 2018-10-05 MED ORDER — CLONIDINE HCL 0.1 MG PO TABS
0.1000 mg | ORAL_TABLET | ORAL | Status: DC | PRN
  Administered 2018-10-08: 0.1 mg via ORAL
  Filled 2018-10-05: qty 1

## 2018-10-05 MED ORDER — LITHIUM CARBONATE ER 300 MG PO TBCR
300.0000 mg | EXTENDED_RELEASE_TABLET | Freq: Two times a day (BID) | ORAL | Status: DC
  Administered 2018-10-05 – 2018-10-09 (×9): 300 mg via ORAL
  Filled 2018-10-05 (×14): qty 1

## 2018-10-05 MED ORDER — BUPRENORPHINE HCL-NALOXONE HCL 2-0.5 MG SL SUBL
3.0000 | SUBLINGUAL_TABLET | Freq: Two times a day (BID) | SUBLINGUAL | Status: DC
  Administered 2018-10-05 – 2018-10-08 (×7): 3 via SUBLINGUAL
  Filled 2018-10-05 (×7): qty 3

## 2018-10-05 MED ORDER — LURASIDONE HCL 80 MG PO TABS
80.0000 mg | ORAL_TABLET | Freq: Two times a day (BID) | ORAL | Status: DC
  Administered 2018-10-05 – 2018-10-09 (×9): 80 mg via ORAL
  Filled 2018-10-05 (×2): qty 1
  Filled 2018-10-05 (×2): qty 2
  Filled 2018-10-05 (×4): qty 1
  Filled 2018-10-05: qty 2
  Filled 2018-10-05 (×7): qty 1

## 2018-10-05 MED ORDER — OXYCODONE HCL ER 10 MG PO T12A: 10.0000 mg | EXTENDED_RELEASE_TABLET | Freq: Two times a day (BID) | ORAL | Status: DC

## 2018-10-05 MED ORDER — PANTOPRAZOLE SODIUM 40 MG PO TBEC
40.0000 mg | DELAYED_RELEASE_TABLET | Freq: Every day | ORAL | Status: DC
  Administered 2018-10-05 – 2018-10-09 (×5): 40 mg via ORAL
  Filled 2018-10-05 (×8): qty 1

## 2018-10-05 MED ORDER — TRAZODONE HCL 100 MG PO TABS
200.0000 mg | ORAL_TABLET | Freq: Every day | ORAL | Status: DC
  Administered 2018-10-05 – 2018-10-08 (×4): 200 mg via ORAL
  Filled 2018-10-05 (×6): qty 2

## 2018-10-05 MED ORDER — CYCLOBENZAPRINE HCL 10 MG PO TABS
10.0000 mg | ORAL_TABLET | Freq: Two times a day (BID) | ORAL | Status: DC
  Administered 2018-10-05 – 2018-10-09 (×9): 10 mg via ORAL
  Filled 2018-10-05 (×4): qty 1
  Filled 2018-10-05: qty 2
  Filled 2018-10-05 (×9): qty 1
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
## 2018-10-05 NOTE — Progress Notes (Signed)
Recreation Therapy Notes  INPATIENT RECREATION THERAPY ASSESSMENT  Patient Details Name: Meredith [name] MRN: [id number] DOB: 03/03/1966 Today's Date: 10/05/2018       Information Obtained From: Patient  Able to Participate in Assessment/Interview: Yes  Patient Presentation: Alert  Reason for Admission (Per Patient): Other (Comments)(Pt stated she went to the hospital about her heart.)  Patient Stressors: (None identified)  Coping Skills:   TV, Music, Exercise, Meditate, Talk, Other (Comment)(Crafts- make wreaths)  Leisure Interests (2+):  Individual - Other (Comment), Crafts - Other (Comment)(Cook; Clean; Make wreaths)  Frequency of Recreation/Participation: Other (Comment)(Daily)  Awareness of Community Resources:  No  Community Resources:     Current Use:    If no, Barriers?:    Expressed Interest in [place]: No  County of Residence:  [place]  Patient Main Form of Transportation: Musician  Patient Strengths:  Intelligent; "When I get down, I know how to come out of it"  Patient Identified Areas of Improvement:  Weight; Exercise  Patient Goal for Hospitalization:  "get out"  Current SI (including self-harm):  No  Current HI:  No  Current AVH: No  Staff Intervention Plan: Group Attendance, Collaborate with Interdisciplinary Treatment Team  Consent to Intern Participation: N/A    Victorino Sparrow, LRT/CTRS  Ria Comment, [place] 10/05/2018, 11:21 AM

## 2018-10-05 NOTE — [facility] Suicide Risk Assessment (Signed)
[facility] Admission Suicide Risk Assessment   Nursing information obtained from:  Patient Demographic factors:  Low socioeconomic status Current Mental Status:  NA Loss Factors:  NA Historical Factors:  Prior suicide attempts, Family history of mental illness or substance abuse Risk Reduction Factors:  Positive coping skills or problem solving skills, Living with another person, especially a relative, Sense of responsibility to family, Positive social support  Total Time spent with patient: 45 minutes Principal Problem: Noncompliance and resultant exacerbation a schizoaffective disorder leading to paranoia Diagnosis:  Active Problems:   Schizoaffective disorder ([place])  Subjective Data: Acute psychosis/opiate dependency  Continued Clinical Symptoms:  Alcohol Use Disorder Identification Test Final Score (AUDIT): 0 The "Alcohol Use Disorders Identification Test", Guidelines for Use in Primary Care, Second Edition.  World Pharmacologist [facility]). Score between 0-7:  no or low risk or alcohol related problems. Score between 8-15:  moderate risk of alcohol related problems. Score between 16-19:  high risk of alcohol related problems. Score 20 or above:  warrants further diagnostic evaluation for alcohol dependence and treatment.   CLINICAL FACTORS:   Bipolar Disorder:   Mixed State   Musculoskeletal: Strength & Muscle Tone: within normal limits Gait & Station: normal Patient leans: N/A  Psychiatric Specialty Exam: Physical Exam  Constitutional: She appears well-developed.  HENT:  Head: Atraumatic.  Cardiovascular: Normal rate.    Review of Systems  Constitutional: Negative.   Eyes: Negative.   Respiratory: Negative.   Cardiovascular: Negative.   Gastrointestinal: Negative.   Musculoskeletal: Negative.   Skin: Negative.   Neurological: Negative.   Endo/Heme/Allergies: Negative.     Blood pressure (!) 145/90, pulse 94, temperature (!) 97.5 F (36.4 C), resp. rate 16, height  5\' 1"  (1.549 m), weight 77 kg, SpO2 98 %.Body mass index is 32.07 kg/m.  General Appearance: Casual  Eye Contact:  Fair  Speech:  Pressured  Volume:  Increased  Mood:  Anxious and Dysphoric  Affect:  Congruent and Constricted  Thought Process:  Linear and Descriptions of Associations: Circumstantial  Orientation:  Full (Time, Place, and Person)  Thought Content:  Illogical and Delusions  Suicidal Thoughts:  No  Homicidal Thoughts:  No  Memory:  Immediate;   Fair  Judgement:  Fair  Insight:  Fair  Psychomotor Activity:  Normal and EPS neg  Concentration:  Concentration: Good  Recall:  [place] of Knowledge:  nl  Language:  Fair  Akathisia:  Negative  Handed:  Right  AIMS (if indicated):     Assets:  Armed forces logistics/support/administrative officer Housing Leisure Time Physical Health Resilience Social Support  ADL's:  Intact  Cognition:  WNL  Sleep:  Number of Hours: 5.75      COGNITIVE FEATURES THAT CONTRIBUTE TO RISK:  None    SUICIDE RISK:   Minimal: No identifiable suicidal ideation.  Patients presenting with no risk factors but with morbid ruminations; may be classified as minimal risk based on the severity of the depressive symptoms  PLAN OF CARE: restart latuda  I certify that inpatient services furnished can reasonably be expected to improve the patient's condition.   Johnn Hai, MD 10/05/2018, 10:21 AM

## 2018-10-05 NOTE — Progress Notes (Signed)
Recreation Therapy Notes  Date: 7.3.20 Time: 1000 Location: 500 Hall Dayroom  Group Topic: Goal Setting  Goal Area(s) Addresses:  Patient will be able to identify at least 3 life goals.  Patient will be able to identify benefit of investing in life goals.  Patient will be able to identify benefit of setting life goals.   Intervention:  Worksheet  Activity: Lobbyist.  Pt will identify goals they wish to accomplish in the next week, month, year and five years.  Pt will also identify obstacles to reaching goals, what they need to achieve goals and what they can start doing now to work towards goals.  Education:  Discharge Planning, Radiographer, therapeutic, Leisure Education   Education Outcome: Acknowledges Education/In Group Clarification Provided/Needs Additional Education  Clinical Observations:  Pt did not attend group.    Victorino Sparrow, LRT/CTRS         Ria Comment, Guage [place] A 10/05/2018 11:00 AM

## 2018-10-05 NOTE — Progress Notes (Signed)
Nursing Progress Note: 7p-7a D: Pt currently presents with a anxious/preoccuppied affect and behavior. Interacting minimally with the milieu. Pt reports good sleep during the previous night with current medication regimen.  A: Pt provided with medications per providers orders. Pt's labs and vitals were monitored throughout the night. Pt supported emotionally and encouraged to express concerns and questions. Pt educated on medications.  R: Pt's safety ensured with 15 minute and environmental checks. Pt currently denies SI, HI, and AVH. Pt verbally contracts to seek staff if SI,HI, or AVH occurs and to consult with staff before acting on any harmful thoughts. Will continue to monitor.   [facility] NOVEL CORONAVIRUS (COVID-19) DAILY CHECK-OFF SYMPTOMS - answer yes or no to each - every day NO YES  Have you had a fever in the past 24 hours?  . Fever (Temp > 37.80C / 100F) X   Have you had any of these symptoms in the past 24 hours? . New Cough .  Sore Throat  .  Shortness of Breath .  Difficulty Breathing .  Unexplained Body Aches   X   Have you had any one of these symptoms in the past 24 hours not related to allergies?   . Runny Nose .  Nasal Congestion .  Sneezing   X   If you have had runny nose, nasal congestion, sneezing in the past 24 hours, has it worsened?  X   EXPOSURES - check yes or no X   Have you traveled outside the state in the past 14 days?  X   Have you been in contact with someone with a confirmed diagnosis of COVID-19 or PUI in the past 14 days without wearing appropriate PPE?  X   Have you been living in the same home as a person with confirmed diagnosis of COVID-19 or a PUI (household contact)?    X   Have you been diagnosed with COVID-19?    X              What to do next: Answered NO to all: Answered YES to anything:   Proceed with unit schedule Follow the [facility] Inpatient Flowsheet.

## 2018-10-05 NOTE — Progress Notes (Signed)
DAR NOTE: Patient presents with anxious affect and depressed mood.  Denies suicidal thoughts, auditory and visual hallucinations.  Described energy level as normal and concentration as good.  Patient is minimal and forward little information during assessment.  Appears guarded and preoccupied with getting discharge.  Rates depression at 3, hopelessness at 2, and anxiety at 1.  Maintained on routine safety checks.  Medications given as prescribed.  Support and encouragement offered as needed. States goal for today is "going home."  Patient is withdrawn and isolates to her room. Patient is safe on and off the unit.

## 2018-10-05 NOTE — Tx Team (Signed)
Interdisciplinary Treatment and Diagnostic Plan Update  10/05/2018 Time of Session:  Meredith [name] MRN: [id number]  Principal Diagnosis: <principal problem not specified>  Secondary Diagnoses: Active Problems:   Schizoaffective disorder (HCC)   Current Medications:  Current Facility-Administered Medications  Medication Dose Route Frequency Provider Last Rate Last Dose  . acetaminophen (TYLENOL) tablet 650 mg  650 mg Oral Q6H PRN [name], NP      . albuterol (VENTOLIN HFA) 108 (90 Base) MCG/ACT inhaler 2 puff  2 puff Inhalation Q6H PRN Johnn Hai, MD      . alum & mag hydroxide-simeth (MAALOX/MYLANTA) 200-200-20 MG/5ML suspension 30 mL  30 mL Oral Q4H PRN [name], NP      . buprenorphine-naloxone (SUBOXONE) 2-0.5 mg per SL tablet 3 tablet  3 tablet Sublingual BID Johnn Hai, MD   3 tablet at 10/05/18 1053  . cloNIDine (CATAPRES) tablet 0.1 mg  0.1 mg Oral BID Johnn Hai, MD   0.1 mg at 10/05/18 1054  . cloNIDine (CATAPRES) tablet 0.1 mg  0.1 mg Oral Q4H PRN Johnn Hai, MD      . cyclobenzaprine (FLEXERIL) tablet 10 mg  10 mg Oral BID Johnn Hai, MD   10 mg at 10/05/18 1054  . furosemide (LASIX) tablet 20 mg  20 mg Oral Daily Johnn Hai, MD   20 mg at 10/05/18 1054  . lithium carbonate (LITHOBID) CR tablet 300 mg  300 mg Oral Q12H Johnn Hai, MD   300 mg at 10/05/18 1054  . lurasidone (LATUDA) tablet 80 mg  80 mg Oral BID Johnn Hai, MD   80 mg at 10/05/18 1054  . nicotine (NICODERM CQ - dosed in mg/24 hours) patch 21 mg  21 mg Transdermal Daily Johnn Hai, MD      . pantoprazole (PROTONIX) EC tablet 40 mg  40 mg Oral Daily Johnn Hai, MD   40 mg at 10/05/18 1054  . pregabalin (LYRICA) capsule 100 mg  100 mg Oral TID Johnn Hai, MD      . traZODone (DESYREL) tablet 200 mg  200 mg Oral QHS Johnn Hai, MD       PTA Medications: Medications Prior to Admission  Medication Sig Dispense Refill Last Dose  . albuterol (PROVENTIL HFA;VENTOLIN HFA) 108  (90 Base) MCG/ACT inhaler Inhale 2 puffs into the lungs every 6 (six) hours as needed for wheezing or shortness of breath.     Marland Kitchen albuterol (PROVENTIL) (2.5 MG/3ML) 0.083% nebulizer solution Take 2.5 mg by nebulization every 6 (six) hours as needed for wheezing or shortness of breath.     . buprenorphine-naloxone (SUBOXONE) 8-2 mg SUBL SL tablet Place 1 tablet under the tongue 2 (two) times daily.     . Carboxymethylcellulose Sod PF (REFRESH PLUS) 0.5 % SOLN Place 1 drop into both eyes 4 (four) times daily.     . cyclobenzaprine (FLEXERIL) 10 MG tablet Take 10 mg 2 (two) times daily by mouth.      . cycloSPORINE (RESTASIS) 0.05 % ophthalmic emulsion Place 1 drop into both eyes 2 (two) times daily.     . Diclofenac Sodium 3 % GEL Apply 2-3 g 4 (four) times daily as needed topically (pain). For back pain.  5   . Doxepin HCl 5 % CREA Take 2 g 4 (four) times daily as needed by mouth (pain). For back pain.  5   . EPINEPHrine 0.3 mg/0.3 mL IJ SOAJ injection Inject 0.3 mg into the muscle daily as needed (for anaphylatic allergic reactions).      Marland Kitchen  esomeprazole (NEXIUM) 40 MG capsule Take 1 capsule (40 mg total) by mouth daily before breakfast. 30 capsule 5   . furosemide (LASIX) 20 MG tablet Take 1 tablet (20 mg total) by mouth daily. 3 tablet 0   . levothyroxine (SYNTHROID, LEVOTHROID) 25 MCG tablet Take 25 mcg by mouth daily.      Marland Kitchen lidocaine (XYLOCAINE) 5 % ointment Apply 1 application topically as needed (pain).     Marland Kitchen lithium carbonate 300 MG capsule Take 300 mg by mouth 2 (two) times daily with a meal.     . Lurasidone HCl (LATUDA) 120 MG TABS Take 120 mg by mouth at bedtime.      . meloxicam (MOBIC) 15 MG tablet Take 15 mg by mouth daily.     . mometasone-formoterol (DULERA) 200-5 MCG/ACT AERO Inhale 2 puffs into the lungs every morning.     . montelukast (SINGULAIR) 10 MG tablet Take 10 mg by mouth at bedtime.     Marland Kitchen MOVANTIK 25 MG TABS tablet TAKE 1 TABLET BY MOUTH DAILY. 30 tablet 0   . naloxone  (NARCAN) nasal spray 4 mg/0.1 mL Place 1 spray into the nose once as needed (opioid overdose).     . nitrofurantoin (MACRODANTIN) 100 MG capsule Take 100 mg by mouth daily. Preventive     . Olopatadine HCl (PAZEO) 0.7 % SOLN Place 1 drop into both eyes 2 (two) times daily.     . ondansetron (ZOFRAN-ODT) 8 MG disintegrating tablet Take 8 mg every 8 (eight) hours as needed by mouth for nausea or vomiting.     . polyethylene glycol powder (GLYCOLAX/MIRALAX) 17 GM/SCOOP powder Take 17 g by mouth daily. 578 g 5   . potassium chloride (K-DUR,KLOR-CON) 10 MEQ tablet Take 10 mEq by mouth daily.     . pregabalin (LYRICA) 50 MG capsule Take 50 mg by mouth 3 (three) times daily.      Marland Kitchen tiotropium (SPIRIVA) 18 MCG inhalation capsule Place 18 mcg into inhaler and inhale daily.     . traZODone (DESYREL) 150 MG tablet Take 150 mg by mouth at bedtime.       Patient Stressors: Health problems Traumatic event  Patient Strengths: Motivation for treatment/growth Physical Health Supportive family/friends  Treatment Modalities: Medication Management, Group therapy, Case management,  1 to 1 session with clinician, Psychoeducation, Recreational therapy.   Physician Treatment Plan for Primary Diagnosis: <principal problem not specified> Long Term Goal(s): Improvement in symptoms so as ready for discharge Improvement in symptoms so as ready for discharge   Short Term Goals: Ability to verbalize feelings will improve Ability to disclose and discuss suicidal ideas Ability to demonstrate self-control will improve Ability to maintain clinical measurements within normal limits will improve Ability to demonstrate self-control will improve Ability to identify and develop effective coping behaviors will improve Ability to maintain clinical measurements within normal limits will improve  Medication Management: Evaluate patient's response, side effects, and tolerance of medication regimen.  Therapeutic  Interventions: 1 to 1 sessions, Unit Group sessions and Medication administration.  Evaluation of Outcomes: Not Met  Physician Treatment Plan for Secondary Diagnosis: Active Problems:   Schizoaffective disorder ([place])  Long Term Goal(s): Improvement in symptoms so as ready for discharge Improvement in symptoms so as ready for discharge   Short Term Goals: Ability to verbalize feelings will improve Ability to disclose and discuss suicidal ideas Ability to demonstrate self-control will improve Ability to maintain clinical measurements within normal limits will improve Ability to demonstrate self-control will improve  Ability to identify and develop effective coping behaviors will improve Ability to maintain clinical measurements within normal limits will improve     Medication Management: Evaluate patient's response, side effects, and tolerance of medication regimen.  Therapeutic Interventions: 1 to 1 sessions, Unit Group sessions and Medication administration.  Evaluation of Outcomes: Not Met   RN Treatment Plan for Primary Diagnosis: <principal problem not specified> Long Term Goal(s): Knowledge of disease and therapeutic regimen to maintain health will improve  Short Term Goals: Ability to participate in decision making will improve, Ability to verbalize feelings will improve, Ability to disclose and discuss suicidal ideas, Ability to identify and develop effective coping behaviors will improve and Compliance with prescribed medications will improve  Medication Management: RN will administer medications as ordered by provider, will assess and evaluate patient's response and provide education to patient for prescribed medication. RN will report any adverse and/or side effects to prescribing provider.  Therapeutic Interventions: 1 on 1 counseling sessions, Psychoeducation, Medication administration, Evaluate responses to treatment, Monitor vital signs and CBGs as ordered, Perform/monitor  CIWA, COWS, AIMS and Fall Risk screenings as ordered, Perform wound care treatments as ordered.  Evaluation of Outcomes: Not Met   LCSW Treatment Plan for Primary Diagnosis: <principal problem not specified> Long Term Goal(s): Safe transition to appropriate next level of care at discharge, Engage patient in therapeutic group addressing interpersonal concerns.  Short Term Goals: Engage patient in aftercare planning with referrals and resources  Therapeutic Interventions: Assess for all discharge needs, 1 to 1 time with Social worker, Explore available resources and support systems, Assess for adequacy in community support network, Educate family and significant other(s) on suicide prevention, Complete Psychosocial Assessment, Interpersonal group therapy.  Evaluation of Outcomes: Not Met   Progress in Treatment: Attending groups: No. Participating in groups: No. Taking medication as prescribed: Yes. Toleration medication: Yes. Family/Significant other contact made: No, will contact:  if patient consents to collateral contacts Patient understands diagnosis: Yes. Discussing patient identified problems/goals with staff: Yes. Medical problems stabilized or resolved: Yes. Denies suicidal/homicidal ideation: Yes. Issues/concerns per patient self-inventory: No. Other:   New problem(s) identified: None   New Short Term/Long Term Goal(s): medication stabilization, elimination of SI thoughts, development of comprehensive mental wellness plan.    Patient Goals:    Discharge Plan or Barriers: Patient recently admitted. CSW will continue to follow and assess for appropriate referrals and possible discharge planning.    Reason for Continuation of Hospitalization: Anxiety Depression Hallucinations Medication stabilization  Estimated Length of Stay: 3-5 days   Attendees: Patient: 10/05/2018 11:17 AM  Physician: Dr. Johnn Hai, MD 10/05/2018 11:17 AM  Nursing: Benjamine Mola.Jenetta Downer, RN 10/05/2018 11:17  AM  RN Care Manager: 10/05/2018 11:17 AM  Social Worker: Radonna Ricker, [place] 10/05/2018 11:17 AM  Recreational Therapist:  10/05/2018 11:17 AM  Other:  10/05/2018 11:17 AM  Other:  10/05/2018 11:17 AM  Other: 10/05/2018 11:17 AM    Scribe for Treatment Team: Marylee Floras, [place] 10/05/2018 11:17 AM

## 2018-10-06 MED ORDER — AMLODIPINE BESYLATE 5 MG PO TABS
5.0000 mg | ORAL_TABLET | Freq: Every day | ORAL | Status: DC
  Administered 2018-10-06 – 2018-10-07 (×2): 5 mg via ORAL
  Filled 2018-10-06 (×4): qty 1

## 2018-10-06 MED ORDER — SALINE SPRAY 0.65 % NA SOLN
1.0000 | NASAL | Status: DC | PRN
  Administered 2018-10-07 (×2): 1 via NASAL
  Filled 2018-10-06: qty 44

## 2018-10-06 NOTE — [facility] Counselor (Signed)
CSW tried to awaken pt to do Psychosocial Assessment, calling her name 4 times, but she was sleeping deeply.  CSW to attempt again tomorrow.  Selmer Dominion, LCSW 10/06/2018, 3:52 PM

## 2018-10-06 NOTE — [facility] Group Notes (Signed)
Group was deferred for outdoor time today.  Selmer Dominion, LCSW 10/06/2018, 12:01 PM

## 2018-10-06 NOTE — Plan of Care (Signed)
D: Pt alert and oriented on the unit.  Pt denies SI/HI, A/VH. Pt's affect was flat and sad, and she was isolative to her room most of the day. Pt is cooperative. A: Education, support and encouragement provided, q15 minute safety checks remain in effect. Medications administered per MD orders. R: No reactions/side effects to medicine noted. Pt denies any concerns at this time, and verbally contracts for safety. Pt ambulating on the unit with no issues. Pt remains safe on and off the unit.   Problem: Coping: Goal: Ability to verbalize frustrations and anger appropriately will improve Outcome: Progressing

## 2018-10-06 NOTE — Progress Notes (Signed)
Patient ID: Meredith [name], female   DOB: 11-30-1965, 53 y.o.   MRN: [id number] [place] NOVEL CORONAVIRUS (COVID-19) DAILY CHECK-OFF SYMPTOMS - answer yes or no to each - every day NO YES  Have you had a fever in the past 24 hours?  . Fever (Temp > 37.80C / 100F) X   Have you had any of these symptoms in the past 24 hours? . New Cough .  Sore Throat  .  Shortness of Breath .  Difficulty Breathing .  Unexplained Body Aches   X   Have you had any one of these symptoms in the past 24 hours not related to allergies?   . Runny Nose .  Nasal Congestion .  Sneezing   X   If you have had runny nose, nasal congestion, sneezing in the past 24 hours, has it worsened?  X   EXPOSURES - check yes or no X   Have you traveled outside the state in the past 14 days?  X   Have you been in contact with someone with a confirmed diagnosis of COVID-19 or PUI in the past 14 days without wearing appropriate PPE?  X   Have you been living in the same home as a person with confirmed diagnosis of COVID-19 or a PUI (household contact)?    X   Have you been diagnosed with COVID-19?    X              What to do next: Answered NO to all: Answered YES to anything:   Proceed with unit schedule Follow the [facility] Inpatient Flowsheet.
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
## 2018-10-07 MED ORDER — AMLODIPINE BESYLATE 5 MG PO TABS
5.0000 mg | ORAL_TABLET | ORAL | Status: AC
  Administered 2018-10-07: 5 mg via ORAL
  Filled 2018-10-07: qty 1

## 2018-10-07 MED ORDER — AMLODIPINE BESYLATE 10 MG PO TABS
10.0000 mg | ORAL_TABLET | Freq: Every day | ORAL | Status: DC
  Administered 2018-10-08 – 2018-10-09 (×2): 10 mg via ORAL
  Filled 2018-10-07 (×4): qty 1

## 2018-10-07 NOTE — Progress Notes (Signed)
D    Pt is quiet and pleasant on approach  Pt did not make any delusional statements    Pt is somewhat guarded  A   Verbal support given   Medications administered and effectiveness monitored    Q 15 min checks R   Pt remains safe at this time  [place] NOVEL CORONAVIRUS (COVID-19) DAILY CHECK-OFF SYMPTOMS - answer yes or no to each - every day NO YES  Have you had a fever in the past 24 hours?  . Fever (Temp > 37.80C / 100F) X   Have you had any of these symptoms in the past 24 hours? . New Cough .  Sore Throat  .  Shortness of Breath .  Difficulty Breathing .  Unexplained Body Aches   X   Have you had any one of these symptoms in the past 24 hours not related to allergies?   . Runny Nose .  Nasal Congestion .  Sneezing   X   If you have had runny nose, nasal congestion, sneezing in the past 24 hours, has it worsened?  X   EXPOSURES - check yes or no X   Have you traveled outside the state in the past 14 days?  X   Have you been in contact with someone with a confirmed diagnosis of COVID-19 or PUI in the past 14 days without wearing appropriate PPE?  X   Have you been living in the same home as a person with confirmed diagnosis of COVID-19 or a PUI (household contact)?    X   Have you been diagnosed with COVID-19?    X              What to do next: Answered NO to all: Answered YES to anything:   Proceed with unit schedule Follow the [facility] Inpatient Flowsheet.

## 2018-10-07 NOTE — Progress Notes (Signed)
D. Pt presents with an anxious affect/ mood- calm and cooperative behavior- friendly during interactions. Per pt's self inventory, pt rated her depression, hopelessness and anxiety all 0's today. Pt writes that her goal today is "going home getting back into my routine of daily things seeing my partner and dog". Pt currently denies SI/HI and AVH  A. Labs and vitals monitored. Pt compliant with medications. Pt supported emotionally and encouraged to express concerns and ask questions.   R. Pt remains safe with 15 minute checks. Will continue POC.

## 2018-10-07 NOTE — Progress Notes (Signed)
[employer] brought in $15 for patient to use in vending machine. Patient made aware that she is responsible for her money when it is not secured in a locker. Patient expresses verbal understanding

## 2018-10-07 NOTE — [facility] Group Notes (Signed)
[facility] LCSW Group Therapy Note  Date/Time:  10/07/2018  11:00AM-12:00PM  Type of Therapy and Topic:  Group Therapy:  Music and Mood  Participation Level:  Active   Description of Group: In this process group, members listened to a variety of genres of music and identified that different types of music evoke different responses.  Patients were encouraged to identify music that was soothing for them and music that was energizing for them.  Patients discussed how this knowledge can help with wellness and recovery in various ways including managing depression and anxiety as well as encouraging healthy sleep habits.    Therapeutic Goals: 1. Patients will explore the impact of different varieties of music on mood 2. Patients will verbalize the thoughts they have when listening to different types of music 3. Patients will identify music that is soothing to them as well as music that is energizing to them 4. Patients will discuss how to use this knowledge to assist in maintaining wellness and recovery 5. Patients will explore the use of music as a coping skill  Summary of Patient Progress:  At the beginning of group, patient expressed that she felt bad physically from her blood pressure medications.  She also felt a little down/depressed.  At the end of group she stated she still felt bad physically but her depression was better.  She participated fully throughout group and did not show evidence of physical ailment.  Therapeutic Modalities: Solution Focused Brief Therapy Activity   Selmer Dominion, LCSW

## 2018-10-07 NOTE — Plan of Care (Signed)
Nurse discussed anxiety, depression, coping skills with patient. 

## 2018-10-07 NOTE — Progress Notes (Signed)
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
## 2018-10-07 NOTE — Progress Notes (Signed)
DAR NOTE: Patient presents with anxious affect and depressed mood. Pt complained plained of insomnia, dry nose. Pt reports good day. Denies pain, auditory and visual hallucinations.  Maintained on routine safety checks.  Medications given as prescribed.  Support and encouragement offered as needed.  Will continue to monitor.

## 2018-10-08 LAB — PROLACTIN: Prolactin: 18.2 ng/mL (ref 4.8–23.3)

## 2018-10-08 MED ORDER — BUPRENORPHINE HCL-NALOXONE HCL 2-0.5 MG SL SUBL
2.0000 | SUBLINGUAL_TABLET | Freq: Two times a day (BID) | SUBLINGUAL | Status: DC
  Administered 2018-10-08 – 2018-10-09 (×2): 2 via SUBLINGUAL
  Filled 2018-10-08 (×2): qty 2

## 2018-10-08 NOTE — Plan of Care (Addendum)
D: Patient is alert, oriented, pleasant, and cooperative. Denies SI, HI, AVH, and verbally contracts for safety.    Clonidine administered at 2136 for dbp>90.  A: Medications administered per MD order. Support provided. Patient educated on safety on the unit and medications. Routine safety checks every 15 minutes. Patient stated understanding to tell nurse about any new physical symptoms. Patient understands to tell staff of any needs.     R: No adverse drug reactions noted. Patient verbally contracts for safety. Patient remains safe at this time and will continue to monitor.   Problem: Safety: Goal: Periods of time without injury will increase Outcome: Progressing  Patient remains safe and will continue to monitor.  [place] NOVEL CORONAVIRUS (COVID-19) DAILY CHECK-OFF SYMPTOMS - answer yes or no to each - every day NO YES  Have you had a fever in the past 24 hours?  . Fever (Temp > 37.80C / 100F) X   Have you had any of these symptoms in the past 24 hours? . New Cough .  Sore Throat  .  Shortness of Breath .  Difficulty Breathing .  Unexplained Body Aches   X   Have you had any one of these symptoms in the past 24 hours not related to allergies?   . Runny Nose .  Nasal Congestion .  Sneezing   X   If you have had runny nose, nasal congestion, sneezing in the past 24 hours, has it worsened?  X   EXPOSURES - check yes or no X   Have you traveled outside the state in the past 14 days?  X   Have you been in contact with someone with a confirmed diagnosis of COVID-19 or PUI in the past 14 days without wearing appropriate PPE?  X   Have you been living in the same home as a person with confirmed diagnosis of COVID-19 or a PUI (household contact)?    X   Have you been diagnosed with COVID-19?    X              What to do next: Answered NO to all: Answered YES to anything:   Proceed with unit schedule Follow the [facility] Inpatient Flowsheet.

## 2018-10-08 NOTE — Progress Notes (Signed)
Patient ID: Meredith [name], female   DOB: [birth year], 53 y.o.   MRN: [id number]   [place] NOVEL CORONAVIRUS (COVID-19) DAILY CHECK-OFF SYMPTOMS - answer yes or no to each - every day NO YES  Have you had a fever in the past 24 hours?  . Fever (Temp > 37.80C / 100F) X   Have you had any of these symptoms in the past 24 hours? . New Cough .  Sore Throat  .  Shortness of Breath .  Difficulty Breathing .  Unexplained Body Aches   X   Have you had any one of these symptoms in the past 24 hours not related to allergies?   . Runny Nose .  Nasal Congestion .  Sneezing   X   If you have had runny nose, nasal congestion, sneezing in the past 24 hours, has it worsened?  X   EXPOSURES - check yes or no X   Have you traveled outside the state in the past 14 days?  X   Have you been in contact with someone with a confirmed diagnosis of COVID-19 or PUI in the past 14 days without wearing appropriate PPE?  X   Have you been living in the same home as a person with confirmed diagnosis of COVID-19 or a PUI (household contact)?    X   Have you been diagnosed with COVID-19?    X              What to do next: Answered NO to all: Answered YES to anything:   Proceed with unit schedule Follow the [facility] Inpatient Flowsheet.

## 2018-10-08 NOTE — Plan of Care (Signed)
D: Pt alert and oriented on the unit. Pt denies SI/HI, A/VH. Pt's affect was flat. Pt is cooperative. A: Education, support and encouragement provided, q15 minute safety checks remain in effect. Medications administered per MD orders. R: No reactions/side effects to medicine noted. Pt denies any concerns at this time, and verbally contracts for safety. Pt ambulating on the unit with no issues. Pt remains safe on and off the unit.  Problem: Education: Goal: Emotional status will improve Outcome: Progressing Goal: Mental status will improve Outcome: Progressing
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
## 2018-10-09 MED ORDER — ARIPIPRAZOLE ER 400 MG IM SRER
400.0000 mg | INTRAMUSCULAR | Status: DC
  Administered 2018-10-09: 10:00:00 400 mg via INTRAMUSCULAR

## 2018-10-09 MED ORDER — ARIPIPRAZOLE ER 400 MG IM SRER: 400.0000 mg | INTRAMUSCULAR | 11 refills | Status: DC

## 2018-10-09 MED ORDER — LURASIDONE HCL 60 MG PO TABS: 60.0000 mg | ORAL_TABLET | Freq: Two times a day (BID) | ORAL | 2 refills | Status: DC

## 2018-10-09 MED ORDER — AMLODIPINE BESYLATE 10 MG PO TABS: 10.0000 mg | ORAL_TABLET | Freq: Every day | ORAL | 1 refills | Status: DC

## 2018-10-09 MED ORDER — LITHIUM CARBONATE ER 300 MG PO TBCR: 300.0000 mg | EXTENDED_RELEASE_TABLET | Freq: Two times a day (BID) | ORAL | 2 refills | Status: DC

## 2018-10-09 MED ORDER — TRAZODONE HCL 100 MG PO TABS: 200.0000 mg | ORAL_TABLET | Freq: Every day | ORAL | 2 refills | Status: DC
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
## 2018-10-09 NOTE — [facility] Suicide Risk Assessment (Signed)
[facility] Discharge Suicide Risk Assessment   Principal Problem: Exacerbation of underlying schizoaffective disorder, recent noncompliance Discharge Diagnoses: Active Problems:   Schizoaffective disorder ([place])   Total Time spent with patient: 45 minutes  Musculoskeletal: Strength & Muscle Tone: within normal limits Gait & Station: normal Psychiatric Specialty Exam: ROS  Blood pressure 117/83, pulse 85, temperature 97.8 F (36.6 C), resp. rate 20, height 5\' 1"  (1.549 m), weight 77 kg, SpO2 100 %.Body mass index is 32.07 kg/m.  General Appearance: Casual  Eye Contact::  Good  Speech:  Clear and Coherent409  Volume:  Normal  Mood:  Euthymic  Affect:  Congruent and Constricted  Thought Process:  Coherent and Descriptions of Associations: Intact  Orientation:  Full (Time, Place, and Person)  Thought Content:  Tangential  Suicidal Thoughts:  No  Homicidal Thoughts:  No  Memory:  Immediate;   Fair  Judgement:  Intact  Insight:  Fair  Psychomotor Activity:  Normal  Concentration:  Good  Recall:  Good  Fund of Knowledge:Good  Language: Good  Akathisia:  Negative  Handed:  Right  AIMS (if indicated):     Assets:  Communication Skills Desire for Improvement Financial Resources/Insurance Housing Intimacy Leisure Time Physical Health Resilience Social Support  Sleep:  Number of Hours: 5.5  Cognition: WNL  ADL's:  Intact   Mental Status Per Nursing Assessment::   On Admission:  NA  Demographic Factors:  Caucasian  Loss Factors: Decrease in vocational status  Historical Factors: NA  Risk Reduction Factors:   Sense of responsibility to family and Religious beliefs about death  Continued Clinical Symptoms:  More than one psychiatric diagnosis  Cognitive Features That Contribute To Risk:  None    Suicide Risk:  Minimal: No identifiable suicidal ideation.  Patients presenting with no risk factors but with morbid ruminations; may be classified as minimal risk based on  the severity of the depressive symptoms  Follow-up [place] Follow up on 10/19/2018.   Why: Therapy appointment with [name] is Friday, 7/17 at 11:00a.  Medication management appointment with Dr. Rosine Door is Friday 7/17 at 2:00p.  Appointments will be held over the phone and the providers will contact.  Contact information: [place] [address] [phone]           Plan Of Care/Follow-up recommendations:  Activity:  full  Cristobal Advani, MD 10/09/2018, 8:17 AM

## 2018-10-09 NOTE — Plan of Care (Signed)
Discharge note  Patient verbalizes readiness for discharge. Follow up plan explained, AVS, Transition record and SRA given. Prescriptions and teaching provided. Belongings returned and signed for. Suicide safety plan completed and signed. Patient verbalizes understanding. Patient denies SI/HI and assures this writer she will seek assistance should that change. Patient discharged to lobby where fiancee was waiting.  Problem: Education: Goal: Knowledge of [facility] Education information/materials will improve Outcome: Adequate for Discharge Goal: Emotional status will improve Outcome: Adequate for Discharge Goal: Mental status will improve Outcome: Adequate for Discharge Goal: Verbalization of understanding the information provided will improve Outcome: Adequate for Discharge   Problem: Activity: Goal: Interest or engagement in activities will improve Outcome: Adequate for Discharge Goal: Sleeping patterns will improve Outcome: Adequate for Discharge   Problem: Coping: Goal: Ability to verbalize frustrations and anger appropriately will improve Outcome: Adequate for Discharge Goal: Ability to demonstrate self-control will improve Outcome: Adequate for Discharge   Problem: Health Behavior/Discharge Planning: Goal: Identification of resources available to assist in meeting health care needs will improve Outcome: Adequate for Discharge Goal: Compliance with treatment plan for underlying cause of condition will improve Outcome: Adequate for Discharge   Problem: Physical Regulation: Goal: Ability to maintain clinical measurements within normal limits will improve Outcome: Adequate for Discharge   Problem: Safety: Goal: Periods of time without injury will increase Outcome: Adequate for Discharge   Problem: Education: Goal: Ability to state activities that reduce stress will improve Outcome: Adequate for Discharge   Problem: Coping: Goal: Ability to identify and develop  effective coping behavior will improve Outcome: Adequate for Discharge   Problem: Self-Concept: Goal: Ability to identify factors that promote anxiety will improve Outcome: Adequate for Discharge Goal: Level of anxiety will decrease Outcome: Adequate for Discharge Goal: Ability to modify response to factors that promote anxiety will improve Outcome: Adequate for Discharge   Problem: Education: Goal: Utilization of techniques to improve thought processes will improve Outcome: Adequate for Discharge Goal: Knowledge of the prescribed therapeutic regimen will improve Outcome: Adequate for Discharge   Problem: Activity: Goal: Interest or engagement in leisure activities will improve Outcome: Adequate for Discharge Goal: Imbalance in normal sleep/wake cycle will improve Outcome: Adequate for Discharge   Problem: Coping: Goal: Coping ability will improve Outcome: Adequate for Discharge Goal: Will verbalize feelings Outcome: Adequate for Discharge   Problem: Health Behavior/Discharge Planning: Goal: Ability to make decisions will improve Outcome: Adequate for Discharge Goal: Compliance with therapeutic regimen will improve Outcome: Adequate for Discharge   Problem: Role Relationship: Goal: Will demonstrate positive changes in social behaviors and relationships Outcome: Adequate for Discharge   Problem: Safety: Goal: Ability to disclose and discuss suicidal ideas will improve Outcome: Adequate for Discharge Goal: Ability to identify and utilize support systems that promote safety will improve Outcome: Adequate for Discharge   Problem: Self-Concept: Goal: Will verbalize positive feelings about self Outcome: Adequate for Discharge Goal: Level of anxiety will decrease Outcome: Adequate for Discharge   Problem: Activity: Goal: Will identify at least one activity in which they can participate Outcome: Adequate for Discharge   Problem: Coping: Goal: Ability to identify and  develop effective coping behavior will improve Outcome: Adequate for Discharge Goal: Ability to interact with others will improve Outcome: Adequate for Discharge Goal: Demonstration of participation in decision-making regarding own care will improve Outcome: Adequate for Discharge Goal: Ability to use eye contact when communicating with others will improve Outcome: Adequate for Discharge   Problem: Health Behavior/Discharge Planning: Goal: Identification of resources available  to assist in meeting health care needs will improve Outcome: Adequate for Discharge   Problem: Self-Concept: Goal: Will verbalize positive feelings about self Outcome: Adequate for Discharge   Problem: Education: Goal: Knowledge of the prescribed therapeutic regimen will improve Outcome: Adequate for Discharge   Problem: Coping: Goal: Ability to identify and develop effective coping behavior will improve Outcome: Adequate for Discharge Goal: Will verbalize positive feelings about self Outcome: Adequate for Discharge Goal: Verbalizations of decreased anxiety will increase Outcome: Adequate for Discharge   Problem: Health Behavior/Discharge Planning: Goal: Compliance with therapeutic regimen will improve Outcome: Adequate for Discharge   Problem: Medication: Goal: Ability to use medications correctly will improve Outcome: Adequate for Discharge   Problem: Role Relationship: Goal: Ability to communicate needs accurately will improve Outcome: Adequate for Discharge Goal: Will demonstrate positive changes in social behaviors and relationships Outcome: Adequate for Discharge Goal: Ability to maintain and perform role responsibilities to the fullest extent possible will improve Outcome: Adequate for Discharge   Problem: Activity: Goal: Will verbalize the importance of balancing activity with adequate rest periods Outcome: Adequate for Discharge   Problem: Education: Goal: Will be free of psychotic  symptoms Outcome: Adequate for Discharge Goal: Knowledge of the prescribed therapeutic regimen will improve Outcome: Adequate for Discharge   Problem: Coping: Goal: Coping ability will improve Outcome: Adequate for Discharge Goal: Will verbalize feelings Outcome: Adequate for Discharge   Problem: Health Behavior/Discharge Planning: Goal: Compliance with prescribed medication regimen will improve Outcome: Adequate for Discharge   Problem: Nutritional: Goal: Ability to achieve adequate nutritional intake will improve Outcome: Adequate for Discharge   Problem: Role Relationship: Goal: Ability to communicate needs accurately will improve Outcome: Adequate for Discharge Goal: Ability to interact with others will improve Outcome: Adequate for Discharge   Problem: Safety: Goal: Ability to redirect hostility and anger into socially appropriate behaviors will improve Outcome: Adequate for Discharge Goal: Ability to remain free from injury will improve Outcome: Adequate for Discharge   Problem: Self-Care: Goal: Ability to participate in self-care as condition permits will improve Outcome: Adequate for Discharge   Problem: Self-Concept: Goal: Will verbalize positive feelings about self Outcome: Adequate for Discharge

## 2018-10-09 NOTE — Progress Notes (Signed)
  [facility] Adult Case Management Discharge Plan :  Will you be returning to the same living situation after discharge:  Yes,  with pt's fiance At discharge, do you have transportation home?: Yes,  pt's fiance Do you have the ability to pay for your medications: Yes,  medicaid  Release of information consent forms completed and in the chart;  Patient's signature needed at discharge.  Patient to Follow up at: Follow-up [facility] Follow up on 10/19/2018.   Why: Therapy appointment with [name] is Friday, 7/17 at 11:00a.  Medication management appointment with Dr. Rosine Door is Friday 7/17 at 2:00p.  Appointments will be held over the phone and the providers will contact.  Contact information: [address] [id number] [phone]           Next level of care provider has access to [place] and Suicide Prevention discussed: Yes,  pt's fiance     Has patient been referred to the Quitline?: N/A patient is not a smoker  Patient has been referred for addiction treatment: [facility], LCSW 10/09/2018, 10:54 AM

## 2018-10-16 ENCOUNTER — Telehealth: Payer: Self-pay | Admitting: Obstetrics and Gynecology

## 2018-10-16 NOTE — Telephone Encounter (Signed)
Pt would like an appt to have a mammogram completed. States Glo Herring usually schedules this for.

## 2018-10-16 NOTE — Telephone Encounter (Signed)
LVM for pt to call back to schedule an appt.  

## 2018-10-17 ENCOUNTER — Ambulatory Visit: Payer: Medicaid Other | Admitting: Urology

## 2018-10-21 ENCOUNTER — Emergency Department (HOSPITAL_COMMUNITY)
Admission: EM | Admit: 2018-10-21 | Discharge: 2018-10-21 | Discharge status: Against Medical Advice | Payer: Medicaid Other | Attending: Emergency Medicine | Admitting: Emergency Medicine

## 2018-10-21 ENCOUNTER — Other Ambulatory Visit: Payer: Self-pay

## 2018-10-21 ENCOUNTER — Encounter (HOSPITAL_COMMUNITY): Payer: Self-pay

## 2018-10-21 DIAGNOSIS — F22 Delusional disorders: Secondary | ICD-10-CM | POA: Diagnosis present

## 2018-10-21 DIAGNOSIS — Z5321 Procedure and treatment not carried out due to patient leaving prior to being seen by health care provider: Secondary | ICD-10-CM | POA: Insufficient documentation

## 2018-10-21 HISTORY — DX: Delusional disorders: F22

## 2018-10-21 HISTORY — DX: Schizoaffective disorder, unspecified: F25.9

## 2018-10-21 LAB — COMPREHENSIVE METABOLIC PANEL
ALT: 17 U/L (ref 0–44)
AST: 17 U/L (ref 15–41)
Albumin: 3.9 g/dL (ref 3.5–5.0)
Alkaline Phosphatase: 96 U/L (ref 38–126)
Anion gap: 9 (ref 5–15)
BUN: 9 mg/dL (ref 6–20)
CO2: 22 mmol/L (ref 22–32)
Calcium: 8.8 mg/dL — ABNORMAL LOW (ref 8.9–10.3)
Chloride: 106 mmol/L (ref 98–111)
Creatinine, Ser: 0.84 mg/dL (ref 0.44–1.00)
GFR calc Af Amer: >60 mL/min (ref >60)
GFR calc non Af Amer: >60 mL/min (ref >60)
Glucose, Bld: 99 mg/dL (ref 70–99)
Potassium: 3 mmol/L — ABNORMAL LOW (ref 3.5–5.1)
Sodium: 137 mmol/L (ref 135–145)
Total Bilirubin: 0.7 mg/dL (ref 0.3–1.2)
Total Protein: 6.9 g/dL (ref 6.5–8.1)

## 2018-10-21 LAB — CBC WITH DIFFERENTIAL/PLATELET
Abs Immature Granulocytes: 0.02 10*3/uL (ref 0.00–0.07)
Basophils Absolute: 0 10*3/uL (ref 0.0–0.1)
Basophils Relative: 0 %
Eosinophils Absolute: 0.1 10*3/uL (ref 0.0–0.5)
Eosinophils Relative: 1 %
HCT: 42.8 % (ref 36.0–46.0)
Hemoglobin: 13.8 g/dL (ref 12.0–15.0)
Immature Granulocytes: 0 %
Lymphocytes Relative: 18 %
Lymphs Abs: 1.4 10*3/uL (ref 0.7–4.0)
MCH: 29.8 pg (ref 26.0–34.0)
MCHC: 32.2 g/dL (ref 30.0–36.0)
MCV: 92.4 fL (ref 80.0–100.0)
Monocytes Absolute: 0.6 10*3/uL (ref 0.1–1.0)
Monocytes Relative: 7 %
Neutro Abs: 5.7 10*3/uL (ref 1.7–7.7)
Neutrophils Relative %: 74 %
Platelets: 279 10*3/uL (ref 150–400)
RBC: 4.63 MIL/uL (ref 3.87–5.11)
RDW: 16 % — ABNORMAL HIGH (ref 11.5–15.5)
WBC: 7.8 10*3/uL (ref 4.0–10.5)
nRBC: 0 % (ref 0.0–0.2)

## 2018-10-21 LAB — URINALYSIS, ROUTINE W REFLEX MICROSCOPIC
Bilirubin Urine: NEGATIVE
Glucose, UA: NEGATIVE mg/dL
Hgb urine dipstick: NEGATIVE
Ketones, ur: NEGATIVE mg/dL
Leukocytes,Ua: NEGATIVE
Nitrite: NEGATIVE
Protein, ur: NEGATIVE mg/dL
Specific Gravity, Urine: 1.003 — ABNORMAL LOW (ref 1.005–1.030)
pH: 6 (ref 5.0–8.0)

## 2018-10-21 LAB — RAPID URINE DRUG SCREEN, HOSP PERFORMED
Amphetamines: NOT DETECTED
Barbiturates: NOT DETECTED
Benzodiazepines: NOT DETECTED
Cocaine: NOT DETECTED
Opiates: NOT DETECTED
Tetrahydrocannabinol: NOT DETECTED

## 2018-10-21 LAB — ETHANOL: Alcohol, Ethyl (B): <10 mg/dL (ref <10)

## 2018-10-21 LAB — LITHIUM LEVEL: Lithium Lvl: 0.46 mmol/L — ABNORMAL LOW (ref 0.60–1.20)

## 2018-10-21 MED ORDER — POTASSIUM CHLORIDE CRYS ER 20 MEQ PO TBCR
40.0000 meq | EXTENDED_RELEASE_TABLET | Freq: Once | ORAL | Status: AC
  Administered 2018-10-21: 40 meq via ORAL
  Filled 2018-10-21: qty 2

## 2018-10-21 NOTE — ED Notes (Addendum)
TTS wanted to speak to patient again. Secretary wheeled cart in room and pt was not found. Pt not in any bathrooms. Will wait a few minutes to see if she returns. Pt has denied SI/HI.
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
## 2018-10-21 NOTE — ED Notes (Signed)
Pt not SI/HI. No sitter necessary at this time per EDP.

## 2018-10-21 NOTE — ED Triage Notes (Signed)
Pt states she is here because she has "microleads in her heart and her friend brought a heart from [place] for her and has it here for her to have a transplant."

## 2018-10-24 ENCOUNTER — Telehealth: Payer: Self-pay | Admitting: Cardiology

## 2018-10-24 NOTE — Telephone Encounter (Signed)
    I have never evaluated this patient. By review of notes, she has a history of palpitations and had an event monitor in 2017 which showed no significant arrhythmias. Was evaluated in the ED on 7/19 for "concerns of needing a heart transplant". EKG was performed and showed no acute changes when compared to prior tracings but she left AMA prior to full evaluation.   Agree with plan. If any cancelations, can add patient at that time. If she develops any worsening palpitations or chest discomfort in the interim, should proceed to the ED for emergent evaluation.   Signed, Erma Heritage, PA-C 10/24/2018, 1:08 PM Pager: [phone]

## 2018-10-24 NOTE — Telephone Encounter (Signed)
Reports problems with her heart skipping. Says "my heart beat stops everyday and it takes my breath". Requesting a sooner appointment. C/o intermittent chest and left arm pain rated 7/10. Takes suboxone for pain and said it helps the chest and left arm pain. Denies dizziness or sob. Denies active chest or left arm pain. Advised no sooner available appointments but will be put on wait list to be contacted if we get a cancellation. Advised that if her symptoms got worse or returned, to go to the ED for an evaluation. Verbalized understanding of plan.

## 2018-10-24 NOTE — Telephone Encounter (Signed)
Patient asking to be seen sooner than July 31. When checked we did not have anything else sooner.   She said she feels her heart skipping and stopping beats and is concerned about it.

## 2018-11-02 ENCOUNTER — Other Ambulatory Visit: Payer: Self-pay

## 2018-11-02 ENCOUNTER — Ambulatory Visit (INDEPENDENT_AMBULATORY_CARE_PROVIDER_SITE_OTHER): Payer: Medicaid Other | Admitting: Student

## 2018-11-02 ENCOUNTER — Ambulatory Visit (INDEPENDENT_AMBULATORY_CARE_PROVIDER_SITE_OTHER): Payer: Medicaid Other

## 2018-11-02 ENCOUNTER — Encounter: Payer: Self-pay | Admitting: Student

## 2018-11-02 VITALS — BP 118/76 | HR 91 | Temp 97.7°F | Ht 63.0 in | Wt 170.0 lb

## 2018-11-02 DIAGNOSIS — F200 Paranoid schizophrenia: Secondary | ICD-10-CM | POA: Diagnosis not present

## 2018-11-02 DIAGNOSIS — Z720 Tobacco use: Secondary | ICD-10-CM

## 2018-11-02 DIAGNOSIS — R0789 Other chest pain: Secondary | ICD-10-CM

## 2018-11-02 DIAGNOSIS — R002 Palpitations: Secondary | ICD-10-CM | POA: Diagnosis not present
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
## 2018-11-02 NOTE — Patient Instructions (Signed)
Medication Instructions:  Your physician recommends that you continue on your current medications as directed. Please refer to the Current Medication list given to you today.  If you need a refill on your cardiac medications before your next appointment, please call your pharmacy.   Lab work: NONE  If you have labs (blood work) drawn today and your tests are completely normal, you will receive your results only by: Marland Kitchen MyChart Message (if you have MyChart) OR . A paper copy in the mail If you have any lab test that is abnormal or we need to change your treatment, we will call you to review the results.  Testing/Procedures: Your physician has requested that you have an echocardiogram. Echocardiography is a painless test that uses sound waves to create images of your heart. It provides your doctor with information about the size and shape of your heart and how well your heart's chambers and valves are working. This procedure takes approximately one hour. There are no restrictions for this procedure.  Your physician has recommended that you wear an event monitor. Event monitors are medical devices that record the heart's electrical activity. Doctors most often [place] these monitors to diagnose arrhythmias. Arrhythmias are problems with the speed or rhythm of the heartbeat. The monitor is a small, portable device. You can wear one while you do your normal daily activities. This is usually used to diagnose what is causing palpitations/syncope (passing out).    Follow-Up: At [facility], you and your health needs are our priority.  As part of our continuing mission to provide you with exceptional heart care, we have created designated Provider Care Teams.  These Care Teams include your primary Cardiologist (physician) and Advanced Practice Providers (APPs -  Physician Assistants and Nurse Practitioners) who all work together to provide you with the care you need, when you need it. You will need a follow  up appointment in 6 months.  Please call our office 2 months in advance to schedule this appointment.  You may see Rozann Lesches, MD or one of the following Advanced Practice Providers on your designated Care Team:   Bernerd Pho, PA-C [facility]) . Ermalinda Barrios, PA-C ([place])  Any Other Special Instructions Will Be Listed Below (If Applicable). Thank you for choosing [facility]!

## 2018-11-03 ENCOUNTER — Encounter: Payer: Self-pay | Admitting: Student

## 2018-11-05 ENCOUNTER — Other Ambulatory Visit: Payer: Self-pay

## 2018-11-06 ENCOUNTER — Ambulatory Visit (HOSPITAL_COMMUNITY)
Admission: RE | Admit: 2018-11-06 | Discharge: 2018-11-06 | Disposition: A | Discharge status: Home / Self Care | Payer: Medicaid Other | Source: Ambulatory Visit | Attending: Student | Admitting: Student

## 2018-11-06 ENCOUNTER — Telehealth: Payer: Self-pay | Admitting: *Deleted

## 2018-11-06 ENCOUNTER — Other Ambulatory Visit: Payer: Self-pay

## 2018-11-06 ENCOUNTER — Ambulatory Visit (HOSPITAL_COMMUNITY)
Admission: RE | Admit: 2018-11-06 | Discharge: 2018-11-06 | Disposition: A | Discharge status: Home / Self Care | Payer: Medicaid Other | Source: Ambulatory Visit | Attending: Urology | Admitting: Urology

## 2018-11-06 DIAGNOSIS — N201 Calculus of ureter: Secondary | ICD-10-CM

## 2018-11-06 DIAGNOSIS — R0789 Other chest pain: Secondary | ICD-10-CM | POA: Insufficient documentation

## 2018-11-06 DIAGNOSIS — R002 Palpitations: Secondary | ICD-10-CM | POA: Diagnosis present

## 2018-11-06 NOTE — Telephone Encounter (Signed)
Called patient with test results. No answer.Unable to leave msg b/c mailbox is full.

## 2018-11-06 NOTE — Telephone Encounter (Signed)
-----   Message from Erma Heritage, [place] sent at 11/06/2018  4:34 PM EDT ----- Please let the patient know her echocardiogram showed a preserved EF of 60 to 65% with no regional wall motion abnormalities. No significant valve abnormalities. Overall, a very reassuring study. Please forward a copy of results to Jani Gravel, MD.

## 2018-11-06 NOTE — Progress Notes (Signed)
*  PRELIMINARY RESULTS* Echocardiogram 2D Echocardiogram has been performed.  Meredith [name] 11/06/2018, 3:10 PM

## 2018-11-08 ENCOUNTER — Telehealth: Payer: Self-pay | Admitting: Licensed Clinical Social Worker

## 2018-11-08 NOTE — Telephone Encounter (Signed)
CSW referred to assist patient with obtaining a BP cuff. CSW contacted patient to inform cuff will be delivered to home. Message left. CSW available as needed. Jackie Emery Dupuy, LCSW, CCSW-MCS [phone]  

## 2018-11-30 IMAGING — RF DG RETROGRADE PYELOGRAM
1 series · 4 of 4 positions shown · non-contrast
Comparison: 03/24/2016

CLINICAL DATA: Right ureteral stone, operative procedure

EXAM:
RETROGRADE PYELOGRAM

[Series 1: run · 4 of 4 slices shown]
[im 1/4]
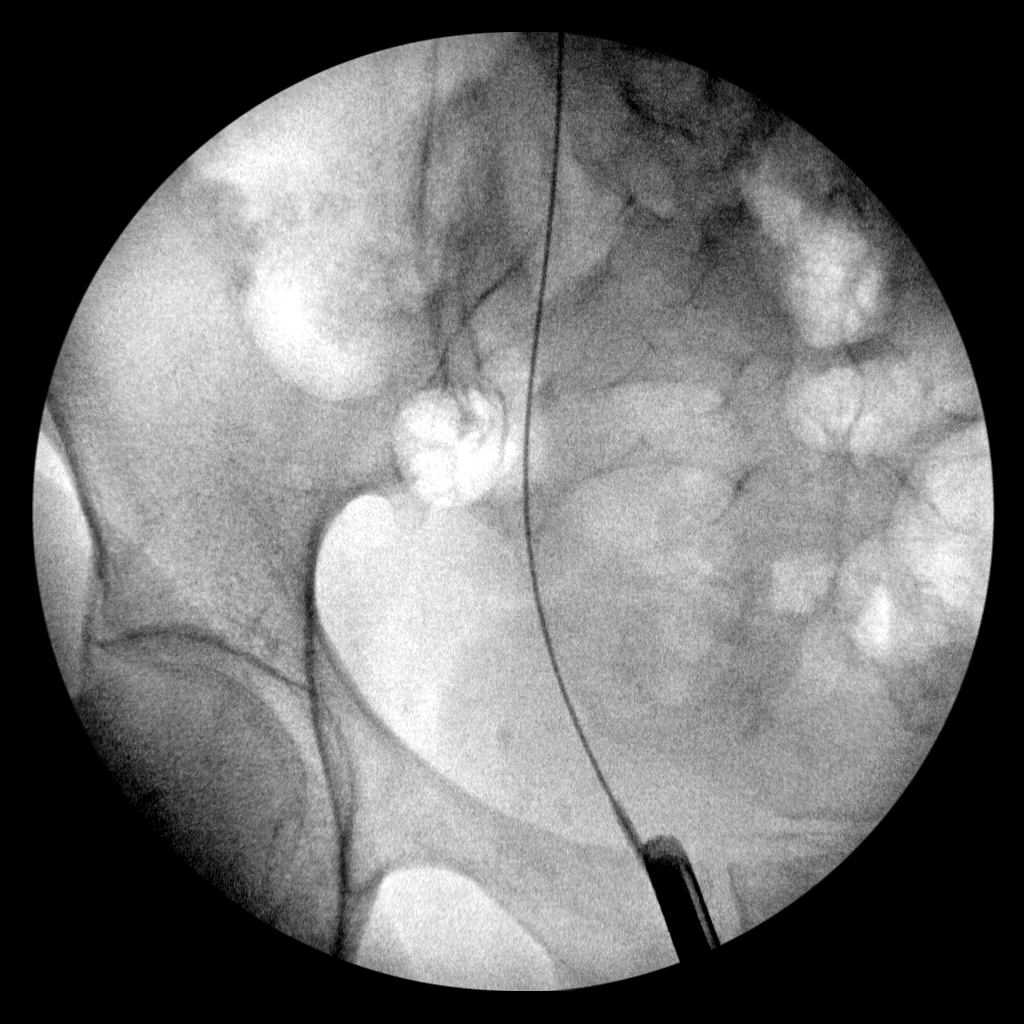
[im 2/4]
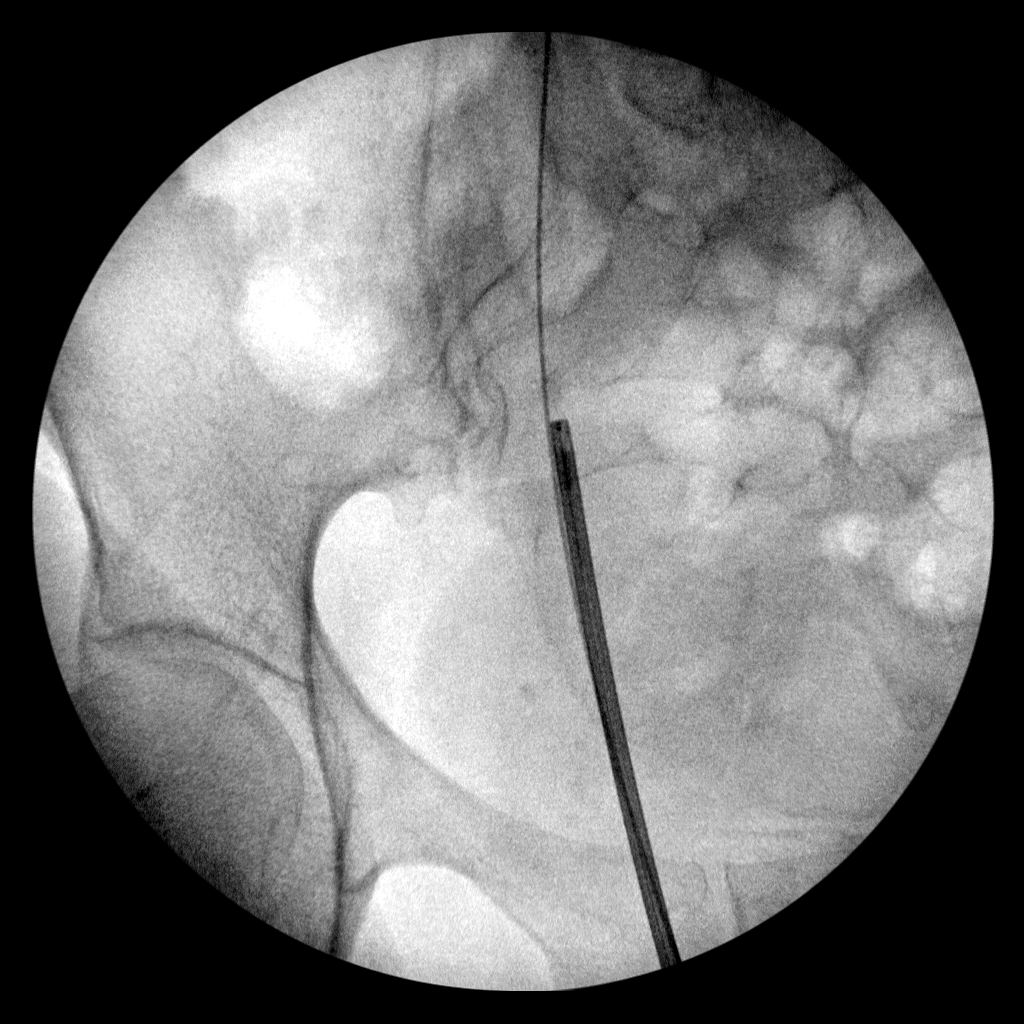
[im 3/4]
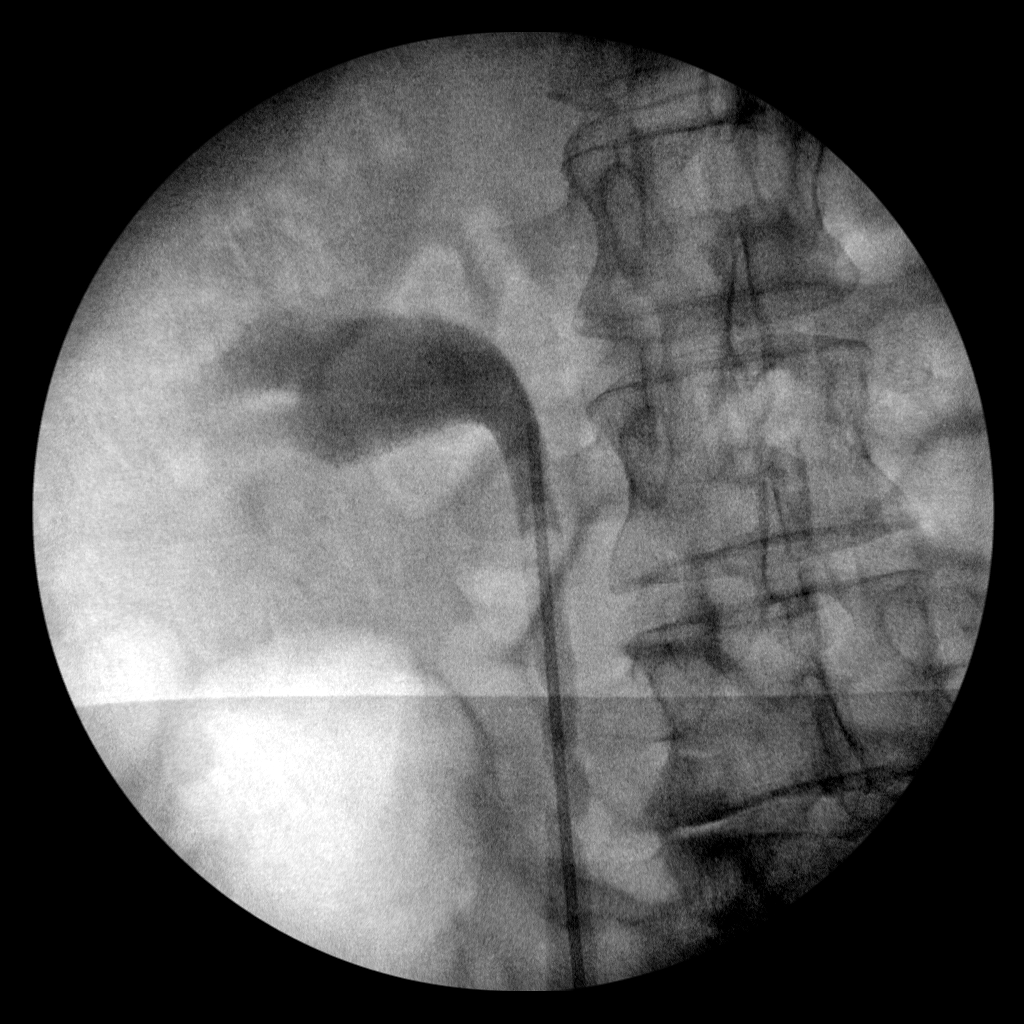
[im 4/4]
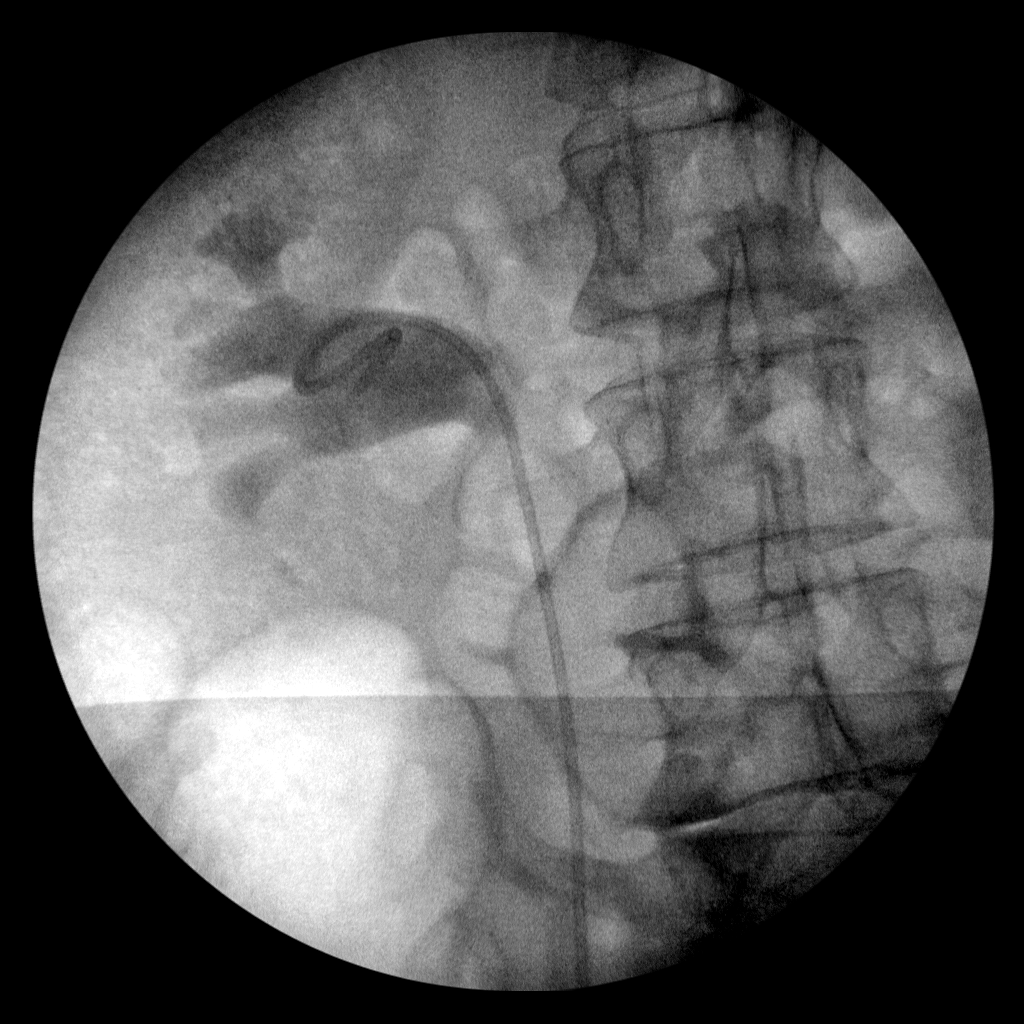

[4 of 4 positions shown; findings below may reference images not displayed]

FINDINGS: Four spot fluoroscopic images during the operative procedure. This
demonstrates cystoscopic insertion and wire cannulation of the right
ureter in a retrograde fashion. Contrast injection performed for
limited retrograde pyelogram. Mild right hydronephrosis noted.
Ureteral stone removal performed with insertion of a right ureteral
stent. Please refer to operative note for details of the procedure.
IMPRESSION: Spot fluoroscopic imaging during right ureteral stone removal and
stent insertion.

## 2018-12-03 ENCOUNTER — Ambulatory Visit (HOSPITAL_COMMUNITY): Admission: RE | Admit: 2018-12-03 | Payer: Medicaid Other | Source: Ambulatory Visit

## 2018-12-06 ENCOUNTER — Other Ambulatory Visit: Payer: Self-pay

## 2018-12-06 ENCOUNTER — Ambulatory Visit (HOSPITAL_COMMUNITY)
Admission: RE | Admit: 2018-12-06 | Discharge: 2018-12-06 | Disposition: A | Discharge status: Home / Self Care | Payer: Medicaid Other | Source: Ambulatory Visit | Attending: Urology | Admitting: Urology

## 2018-12-06 DIAGNOSIS — N201 Calculus of ureter: Secondary | ICD-10-CM | POA: Diagnosis present

## 2018-12-12 ENCOUNTER — Ambulatory Visit (INDEPENDENT_AMBULATORY_CARE_PROVIDER_SITE_OTHER): Payer: Medicaid Other | Admitting: Urology

## 2018-12-12 DIAGNOSIS — N201 Calculus of ureter: Secondary | ICD-10-CM

## 2018-12-14 ENCOUNTER — Other Ambulatory Visit: Payer: Self-pay | Admitting: *Deleted

## 2018-12-14 DIAGNOSIS — R921 Mammographic calcification found on diagnostic imaging of breast: Secondary | ICD-10-CM

## 2018-12-14 DIAGNOSIS — Z09 Encounter for follow-up examination after completed treatment for conditions other than malignant neoplasm: Secondary | ICD-10-CM
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
## 2018-12-18 ENCOUNTER — Other Ambulatory Visit (HOSPITAL_COMMUNITY): Payer: Self-pay | Admitting: Obstetrics and Gynecology

## 2018-12-18 DIAGNOSIS — Z09 Encounter for follow-up examination after completed treatment for conditions other than malignant neoplasm: Secondary | ICD-10-CM

## 2018-12-18 DIAGNOSIS — R921 Mammographic calcification found on diagnostic imaging of breast: Secondary | ICD-10-CM

## 2018-12-22 ENCOUNTER — Other Ambulatory Visit: Payer: Self-pay

## 2018-12-22 ENCOUNTER — Emergency Department (HOSPITAL_COMMUNITY)
Admission: EM | Admit: 2018-12-22 | Discharge: 2018-12-22 | Disposition: A | Discharge status: Home / Self Care | Payer: Medicaid Other | Attending: Emergency Medicine | Admitting: Emergency Medicine

## 2018-12-22 ENCOUNTER — Encounter (HOSPITAL_COMMUNITY): Payer: Self-pay | Admitting: Emergency Medicine

## 2018-12-22 ENCOUNTER — Emergency Department (HOSPITAL_COMMUNITY): Payer: Medicaid Other

## 2018-12-22 DIAGNOSIS — E039 Hypothyroidism, unspecified: Secondary | ICD-10-CM | POA: Insufficient documentation

## 2018-12-22 DIAGNOSIS — J449 Chronic obstructive pulmonary disease, unspecified: Secondary | ICD-10-CM | POA: Diagnosis not present

## 2018-12-22 DIAGNOSIS — Z79899 Other long term (current) drug therapy: Secondary | ICD-10-CM | POA: Insufficient documentation

## 2018-12-22 DIAGNOSIS — F17210 Nicotine dependence, cigarettes, uncomplicated: Secondary | ICD-10-CM | POA: Insufficient documentation

## 2018-12-22 DIAGNOSIS — R002 Palpitations: Secondary | ICD-10-CM | POA: Diagnosis not present

## 2018-12-22 LAB — TROPONIN I (HIGH SENSITIVITY)
Troponin I (High Sensitivity): <2 ng/L (ref <18)
Troponin I (High Sensitivity): 2 ng/L (ref <18)

## 2018-12-22 LAB — CBC WITH DIFFERENTIAL/PLATELET
Abs Immature Granulocytes: 0.02 10*3/uL (ref 0.00–0.07)
Basophils Absolute: 0 10*3/uL (ref 0.0–0.1)
Basophils Relative: 0 %
Eosinophils Absolute: 0.1 10*3/uL (ref 0.0–0.5)
Eosinophils Relative: 1 %
HCT: 48 % — ABNORMAL HIGH (ref 36.0–46.0)
Hemoglobin: 15.1 g/dL — ABNORMAL HIGH (ref 12.0–15.0)
Immature Granulocytes: 0 %
Lymphocytes Relative: 20 %
Lymphs Abs: 1.5 10*3/uL (ref 0.7–4.0)
MCH: 29.9 pg (ref 26.0–34.0)
MCHC: 31.5 g/dL (ref 30.0–36.0)
MCV: 95 fL (ref 80.0–100.0)
Monocytes Absolute: 0.5 10*3/uL (ref 0.1–1.0)
Monocytes Relative: 6 %
Neutro Abs: 5.6 10*3/uL (ref 1.7–7.7)
Neutrophils Relative %: 73 %
Platelets: 275 10*3/uL (ref 150–400)
RBC: 5.05 MIL/uL (ref 3.87–5.11)
RDW: 14.8 % (ref 11.5–15.5)
WBC: 7.7 10*3/uL (ref 4.0–10.5)
nRBC: 0 % (ref 0.0–0.2)

## 2018-12-22 LAB — COMPREHENSIVE METABOLIC PANEL
ALT: 14 U/L (ref 0–44)
AST: 16 U/L (ref 15–41)
Albumin: 3.9 g/dL (ref 3.5–5.0)
Alkaline Phosphatase: 82 U/L (ref 38–126)
Anion gap: 10 (ref 5–15)
BUN: 9 mg/dL (ref 6–20)
CO2: 24 mmol/L (ref 22–32)
Calcium: 9.2 mg/dL (ref 8.9–10.3)
Chloride: 105 mmol/L (ref 98–111)
Creatinine, Ser: 0.66 mg/dL (ref 0.44–1.00)
GFR calc Af Amer: >60 mL/min (ref >60)
GFR calc non Af Amer: >60 mL/min (ref >60)
Glucose, Bld: 121 mg/dL — ABNORMAL HIGH (ref 70–99)
Potassium: 3.7 mmol/L (ref 3.5–5.1)
Sodium: 139 mmol/L (ref 135–145)
Total Bilirubin: 0.2 mg/dL — ABNORMAL LOW (ref 0.3–1.2)
Total Protein: 7 g/dL (ref 6.5–8.1)

## 2018-12-22 LAB — TSH: TSH: 0.78 u[IU]/mL (ref 0.350–4.500)

## 2018-12-22 MED ORDER — ACETAMINOPHEN 325 MG PO TABS
650.0000 mg | ORAL_TABLET | Freq: Once | ORAL | Status: AC
  Administered 2018-12-22: 650 mg via ORAL
  Filled 2018-12-22: qty 2

## 2018-12-22 NOTE — Discharge Instructions (Addendum)
Do not consume any caffeine alcohol tobacco or chocolate and follow-up with your doctor in 1 to 2 weeks for recheck

## 2018-12-22 NOTE — ED Triage Notes (Signed)
Low HR last night, pt states she was scared to go to sleep last night it was so low.  Reports her heart rate has went up since being in the waiting room.  Pt also states last time she was here they tried to put her in a mental hospital and she does not need that.  She needs to be treated for the problem with her heart.
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
## 2018-12-25 ENCOUNTER — Emergency Department (HOSPITAL_COMMUNITY)
Admission: EM | Admit: 2018-12-25 | Discharge: 2018-12-25 | Disposition: A | Discharge status: Home / Self Care | Payer: Medicaid Other | Attending: Emergency Medicine | Admitting: Emergency Medicine

## 2018-12-25 ENCOUNTER — Ambulatory Visit (HOSPITAL_COMMUNITY): Payer: Medicaid Other

## 2018-12-25 ENCOUNTER — Other Ambulatory Visit: Payer: Self-pay

## 2018-12-25 ENCOUNTER — Encounter (HOSPITAL_COMMUNITY): Payer: Self-pay | Admitting: Emergency Medicine

## 2018-12-25 ENCOUNTER — Encounter (HOSPITAL_COMMUNITY): Payer: Medicaid Other

## 2018-12-25 DIAGNOSIS — F17210 Nicotine dependence, cigarettes, uncomplicated: Secondary | ICD-10-CM | POA: Diagnosis not present

## 2018-12-25 DIAGNOSIS — Z791 Long term (current) use of non-steroidal anti-inflammatories (NSAID): Secondary | ICD-10-CM | POA: Insufficient documentation

## 2018-12-25 DIAGNOSIS — J45909 Unspecified asthma, uncomplicated: Secondary | ICD-10-CM | POA: Insufficient documentation

## 2018-12-25 DIAGNOSIS — R002 Palpitations: Secondary | ICD-10-CM | POA: Diagnosis present

## 2018-12-25 DIAGNOSIS — Z79899 Other long term (current) drug therapy: Secondary | ICD-10-CM | POA: Diagnosis not present

## 2018-12-25 DIAGNOSIS — E039 Hypothyroidism, unspecified: Secondary | ICD-10-CM | POA: Insufficient documentation

## 2018-12-25 DIAGNOSIS — J449 Chronic obstructive pulmonary disease, unspecified: Secondary | ICD-10-CM | POA: Diagnosis not present

## 2018-12-25 NOTE — ED Triage Notes (Signed)
C/o heart palpations since last night, rating discomfort 9/10.  C/o upper eigastricpain radiating to ribs and stomach. Pt says she was seen here in ED this past weekend.  Pt has coca, sun drop and water, informed that the soft drinks has caffeine and can cause palpations.
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
## 2019-01-22 ENCOUNTER — Ambulatory Visit (INDEPENDENT_AMBULATORY_CARE_PROVIDER_SITE_OTHER): Payer: Medicaid Other | Admitting: Internal Medicine

## 2019-01-22 ENCOUNTER — Other Ambulatory Visit: Payer: Self-pay

## 2019-01-22 ENCOUNTER — Encounter (INDEPENDENT_AMBULATORY_CARE_PROVIDER_SITE_OTHER): Payer: Self-pay | Admitting: Internal Medicine

## 2019-01-22 VITALS — BP 130/68 | HR 94 | Ht 60.0 in | Wt 149.8 lb

## 2019-01-22 DIAGNOSIS — K5903 Drug induced constipation: Secondary | ICD-10-CM

## 2019-01-22 DIAGNOSIS — T402X5A Adverse effect of other opioids, initial encounter: Secondary | ICD-10-CM | POA: Diagnosis not present

## 2019-01-22 DIAGNOSIS — K219 Gastro-esophageal reflux disease without esophagitis: Secondary | ICD-10-CM

## 2019-01-22 DIAGNOSIS — R1319 Other dysphagia: Secondary | ICD-10-CM

## 2019-01-22 DIAGNOSIS — R1310 Dysphagia, unspecified: Secondary | ICD-10-CM | POA: Diagnosis not present

## 2019-01-22 MED ORDER — NALOXEGOL OXALATE 25 MG PO TABS: 25.0000 mg | ORAL_TABLET | Freq: Every day | ORAL | 5 refills | Status: DC

## 2019-01-22 MED ORDER — POLYETHYLENE GLYCOL 3350 17 GM/SCOOP PO POWD: 17.0000 g | Freq: Every day | ORAL | 5 refills | Status: DC

## 2019-01-22 MED ORDER — ESOMEPRAZOLE MAGNESIUM 40 MG PO CPDR: 40.0000 mg | DELAYED_RELEASE_CAPSULE | Freq: Every day | ORAL | 5 refills | Status: DC

## 2019-01-22 NOTE — Progress Notes (Signed)
Presenting complaint;  Follow for chronic GERD dysphagia and constipation.  Database and subjective:  Patient is 53 year old Caucasian female who has chronic GERD who presented in January with dysphagia.  She underwent esophagogastroduodenoscopy and average rescreening colonoscopy.  EGD revealed esophageal web and a small sliding hiatal hernia.  Esophageal web disrupted by passing 54 [place] Maloney dilator.  Colonoscopy revealed diverticulosis and sigmoid and ascending colon. Patient has opioid-induced constipation and has been on naloxegol. She had a virtual visit 6 months ago. She states she is doing well.  She rarely has heartburn while she is on medication.  She also has noted decrease in nocturnal regurgitation.  She eats her supper 4 to 5 hours before she goes to bed but she may have a snack such as an apple before she goes to bed.  She has intermittent nausea but no vomiting.  She denies dysphagia.  She is watching her diet but she still drinking 2 L of sudden drop every day.  She is drinking diet [place].  She has lost close to 30 pounds in the last 4 to 5 months.  She feels weight loss is voluntary.  Her bowels move daily.  She denies melena or rectal bleeding or abdominal pain.  She states she is not having any withdrawl symptoms while she is on naloxegol.  She may take polyethylene glycol sometimes.  She says she walks virtually every day with her pitbull. She is still smoking cigarettes.  Current Medications: Outpatient Encounter Medications as of 01/22/2019  Medication Sig  . albuterol (PROVENTIL HFA;VENTOLIN HFA) 108 (90 Base) MCG/ACT inhaler Inhale 2 puffs into the lungs every 6 (six) hours as needed for wheezing or shortness of breath.  Marland Kitchen albuterol (PROVENTIL) (2.5 MG/3ML) 0.083% nebulizer solution Take 2.5 mg by nebulization every 6 (six) hours as needed for wheezing or shortness of breath.  . ARIPiprazole ER (ABILIFY MAINTENA) 400 MG SRER injection Inject 2 mLs (400 mg total) into  the muscle every 28 (twenty-eight) days. Due 8/7  . buprenorphine-naloxone (SUBOXONE) 8-2 mg SUBL SL tablet Place 1 tablet under the tongue 2 (two) times daily.  . Carboxymethylcellulose Sod PF (REFRESH PLUS) 0.5 % SOLN Place 1 drop into both eyes daily as needed (for dry eye relief).   . cyclobenzaprine (FLEXERIL) 10 MG tablet Take 10 mg 2 (two) times daily by mouth.   . cycloSPORINE (RESTASIS) 0.05 % ophthalmic emulsion Place 1 drop into both eyes 2 (two) times daily as needed (for chronic dry eye).   . Diclofenac Sodium 3 % GEL Apply 2-3 g 4 (four) times daily as needed topically (pain). For back pain.  . Doxepin HCl 5 % CREA Take 2 g 4 (four) times daily as needed by mouth (pain). For back pain.  Marland Kitchen EPINEPHrine 0.3 mg/0.3 mL IJ SOAJ injection Inject 0.3 mg into the muscle daily as needed (for anaphylatic allergic reactions).   . esomeprazole (NEXIUM) 40 MG capsule Take 1 capsule (40 mg total) by mouth daily before breakfast.  . levothyroxine (SYNTHROID, LEVOTHROID) 25 MCG tablet Take 25 mcg by mouth daily.   Marland Kitchen lidocaine (XYLOCAINE) 5 % ointment Apply 1 application topically as needed (pain).  Marland Kitchen lithium carbonate (LITHOBID) 300 MG CR tablet Take 1 tablet (300 mg total) by mouth every 12 (twelve) hours.  . meloxicam (MOBIC) 15 MG tablet Take 15 mg by mouth daily.  . mometasone-formoterol (DULERA) 200-5 MCG/ACT AERO Inhale 2 puffs into the lungs every morning.  . montelukast (SINGULAIR) 10 MG tablet Take 10 mg by  mouth every morning.   Marland Kitchen MOVANTIK 25 MG TABS tablet TAKE 1 TABLET BY MOUTH DAILY.  . naloxone (NARCAN) nasal spray 4 mg/0.1 mL Place 1 spray into the nose once as needed (opioid overdose).  . Olopatadine HCl (PAZEO) 0.7 % SOLN Place 1 drop into both eyes 2 (two) times daily as needed (for allergy eye relief).   . polyethylene glycol powder (GLYCOLAX/MIRALAX) 17 GM/SCOOP powder Take 17 g by mouth daily.  . pregabalin (LYRICA) 50 MG capsule Take 50 mg by mouth 3 (three) times daily.   .  traZODone (DESYREL) 100 MG tablet Take 2 tablets (200 mg total) by mouth at bedtime.  . [DISCONTINUED] citalopram (CELEXA) 40 MG tablet Take 40 mg by mouth daily.    . [DISCONTINUED] lurasidone 60 MG TABS Take 1 tablet (60 mg total) by mouth 2 (two) times a day. (Patient not taking: Reported on 01/22/2019)   No facility-administered encounter medications on file as of 01/22/2019.      Objective: Blood pressure 130/68, pulse 94, height 5' (1.524 m), weight 149 lb 12.8 oz (67.9 kg). Patient is alert and in no acute distress. Conjunctiva is pink. Sclera is nonicteric Oropharyngeal mucosa is normal. No neck masses or thyromegaly noted. Cardiac exam with regular rhythm normal S1 and S2.  Faint systolic murmur noted at aortic area. Auscultation of lungs reveal few scattered expiratory rhonchi. Abdomen is symmetrical soft and nontender with organomegaly or masses. No LE edema or clubbing noted.  Distal phalanx of left ring finger has been amputated.   Assessment:  #1.  Chronic GERD.  She is doing well with therapy.  New prescription for esomeprazole 40 mg daily will be sent to her pharmacy.  If she continues to do well may consider dropping dose to 20 mg at the time of next office visit.  #2.  Opioid-induced constipation.  She is doing well with dietary measures naloxegol and as needed polyethylene glycol.  #3.  History of esophageal dysphagia secondary to esophageal valve which was dilated disrupted in January this year.  She is not having any more dysphagia   Plan:  Patient advised to take meloxicam with food or snack. New prescription for esomeprazole 40 mg daily before breakfast.  Prescription sent to her pharmacy. Prescription for naloxegol 25 mg p.o. daily sent to patient's pharmacy.  1 month with 5 refills. Finally prescription also sent for polyethylene glycol. Once again patient reminded that she has to make another effort in order to quit cigarette smoking. Office visit in 6  months.

## 2019-01-22 NOTE — Patient Instructions (Signed)
Remember to chew your food thoroughly before swallowing. Notify if swallowing difficulty recurs.

## 2019-02-02 ENCOUNTER — Encounter (HOSPITAL_COMMUNITY): Payer: Self-pay | Admitting: *Deleted

## 2019-02-02 ENCOUNTER — Other Ambulatory Visit: Payer: Self-pay

## 2019-02-02 ENCOUNTER — Emergency Department (HOSPITAL_COMMUNITY): Payer: Medicaid Other

## 2019-02-02 ENCOUNTER — Emergency Department (HOSPITAL_COMMUNITY)
Admission: EM | Admit: 2019-02-02 | Discharge: 2019-02-02 | Disposition: A | Discharge status: Home / Self Care | Payer: Medicaid Other | Attending: Emergency Medicine | Admitting: Emergency Medicine

## 2019-02-02 DIAGNOSIS — Z79899 Other long term (current) drug therapy: Secondary | ICD-10-CM | POA: Insufficient documentation

## 2019-02-02 DIAGNOSIS — Z85828 Personal history of other malignant neoplasm of skin: Secondary | ICD-10-CM | POA: Insufficient documentation

## 2019-02-02 DIAGNOSIS — R1032 Left lower quadrant pain: Secondary | ICD-10-CM | POA: Insufficient documentation

## 2019-02-02 DIAGNOSIS — R1031 Right lower quadrant pain: Secondary | ICD-10-CM | POA: Insufficient documentation

## 2019-02-02 DIAGNOSIS — J449 Chronic obstructive pulmonary disease, unspecified: Secondary | ICD-10-CM | POA: Insufficient documentation

## 2019-02-02 DIAGNOSIS — J45909 Unspecified asthma, uncomplicated: Secondary | ICD-10-CM | POA: Insufficient documentation

## 2019-02-02 DIAGNOSIS — E039 Hypothyroidism, unspecified: Secondary | ICD-10-CM | POA: Diagnosis not present

## 2019-02-02 DIAGNOSIS — Z960 Presence of urogenital implants: Secondary | ICD-10-CM | POA: Diagnosis not present

## 2019-02-02 DIAGNOSIS — F17210 Nicotine dependence, cigarettes, uncomplicated: Secondary | ICD-10-CM | POA: Insufficient documentation

## 2019-02-02 DIAGNOSIS — R109 Unspecified abdominal pain: Secondary | ICD-10-CM

## 2019-02-02 LAB — CBC WITH DIFFERENTIAL/PLATELET
Abs Immature Granulocytes: 0.02 10*3/uL (ref 0.00–0.07)
Basophils Absolute: 0 10*3/uL (ref 0.0–0.1)
Basophils Relative: 0 %
Eosinophils Absolute: 0.1 10*3/uL (ref 0.0–0.5)
Eosinophils Relative: 1 %
HCT: 47 % — ABNORMAL HIGH (ref 36.0–46.0)
Hemoglobin: 14.7 g/dL (ref 12.0–15.0)
Immature Granulocytes: 0 %
Lymphocytes Relative: 26 %
Lymphs Abs: 2.1 10*3/uL (ref 0.7–4.0)
MCH: 29.1 pg (ref 26.0–34.0)
MCHC: 31.3 g/dL (ref 30.0–36.0)
MCV: 92.9 fL (ref 80.0–100.0)
Monocytes Absolute: 0.5 10*3/uL (ref 0.1–1.0)
Monocytes Relative: 6 %
Neutro Abs: 5.3 10*3/uL (ref 1.7–7.7)
Neutrophils Relative %: 67 %
Platelets: 336 10*3/uL (ref 150–400)
RBC: 5.06 MIL/uL (ref 3.87–5.11)
RDW: 14.5 % (ref 11.5–15.5)
WBC: 8 10*3/uL (ref 4.0–10.5)
nRBC: 0 % (ref 0.0–0.2)

## 2019-02-02 LAB — COMPREHENSIVE METABOLIC PANEL
ALT: 14 U/L (ref 0–44)
AST: 15 U/L (ref 15–41)
Albumin: 3.8 g/dL (ref 3.5–5.0)
Alkaline Phosphatase: 76 U/L (ref 38–126)
Anion gap: 6 (ref 5–15)
BUN: 13 mg/dL (ref 6–20)
CO2: 23 mmol/L (ref 22–32)
Calcium: 9 mg/dL (ref 8.9–10.3)
Chloride: 110 mmol/L (ref 98–111)
Creatinine, Ser: 0.62 mg/dL (ref 0.44–1.00)
GFR calc Af Amer: >60 mL/min (ref >60)
GFR calc non Af Amer: >60 mL/min (ref >60)
Glucose, Bld: 118 mg/dL — ABNORMAL HIGH (ref 70–99)
Potassium: 4.1 mmol/L (ref 3.5–5.1)
Sodium: 139 mmol/L (ref 135–145)
Total Bilirubin: 0.3 mg/dL (ref 0.3–1.2)
Total Protein: 6.6 g/dL (ref 6.5–8.1)

## 2019-02-02 LAB — URINALYSIS, ROUTINE W REFLEX MICROSCOPIC
Bilirubin Urine: NEGATIVE
Glucose, UA: NEGATIVE mg/dL
Hgb urine dipstick: NEGATIVE
Ketones, ur: 5 mg/dL — AB
Leukocytes,Ua: NEGATIVE
Nitrite: NEGATIVE
Protein, ur: NEGATIVE mg/dL
Specific Gravity, Urine: 1.006 (ref 1.005–1.030)
pH: 7 (ref 5.0–8.0)

## 2019-02-02 LAB — LIPASE, BLOOD: Lipase: 42 U/L (ref 11–51)

## 2019-02-02 LAB — PREGNANCY, URINE: Preg Test, Ur: NEGATIVE

## 2019-02-02 MED ORDER — KETOROLAC TROMETHAMINE 30 MG/ML IJ SOLN
15.0000 mg | Freq: Once | INTRAMUSCULAR | Status: AC
  Administered 2019-02-02: 15 mg via INTRAVENOUS
  Filled 2019-02-02: qty 1

## 2019-02-02 NOTE — ED Triage Notes (Addendum)
Pt c/o bilateral flank pain that radiates around to left groin area and nausea x 4 days. Denies urinary symptoms, fever. Hx of kidney stones, pt reports pain feels very similar. Pt took Oxycodone at [facility] at 0500 today.

## 2019-02-02 NOTE — ED Notes (Signed)
Pt request pain meds PA informed

## 2019-02-02 NOTE — Discharge Instructions (Addendum)
You have been diagnosed today with flank pain.  At this time there does not appear to be the presence of an emergent medical condition, however there is always the potential for conditions to change. Please read and follow the below instructions.  Please return to the Emergency Department immediately for any new or worsening symptoms. Please be sure to follow up with your Primary Care Provider within one week regarding your visit today; please call their office to schedule an appointment even if you are feeling better for a follow-up visit. You have been given an NSAID-containing medication called Toradol today.  Do not take the medications including ibuprofen, Aleve, Advil, naproxen or other NSAID-containing medications for the next 2 days.  Please be sure to drink plenty of water over the next few days. Your CT scan today showed 2 tiny nonobstructive stones in your right kidney, please discuss this finding with your urologist at your next visit.  You may take your Flomax as prescribed by your urologist.  Please drink plenty of water and get plenty of rest. Your CT scan today also showed incidental findings of aortic atherosclerosis, degenerative changes of L4-L5 and L5-S1.  Discussed these incidental findings with your primary care provider at your next visit.  Get help right away if: You have trouble breathing. You are short of breath. Your belly hurts, or it is swollen or red. You feel sick to your stomach (nauseous). You throw up (vomit). You feel like you will pass out, or you do pass out (faint). You have blood in your pee. You lose control of your bowel or bladder (cannot control your pee or poop) You have numbness, weakness or tingling of any of your body parts You have any new/concerning or worsening of symptoms.  Please read the additional information packets attached to your discharge summary.  Do not take your medicine if  develop an itchy rash, swelling in your mouth or lips, or  difficulty breathing; call 911 and seek immediate emergency medical attention if this occurs.  Note: Portions of this text may have been transcribed using voice recognition software. Every effort was made to ensure accuracy; however, inadvertent computerized transcription errors may still be present.

## 2019-02-02 NOTE — ED Notes (Signed)
Pt reports pain 20/10 as she enjoys chips and soda  Left dept and returned   Reports diagnosed yesterday with renal stone  Sent home with flomax by Dr Alyson Ingles but no pain meds  Took oxycodone from old prescription and now here for pain

## 2019-02-02 NOTE — ED Notes (Signed)
Not in WR when called 
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
## 2019-02-02 NOTE — ED Notes (Signed)
Pt asks what pain meds she is being given  She states "It isn't worth a damn" "I know what it is"   Reports "He says he is sending me home on flomax, I have that at home"  Complaining about wait to get back to treatment area and is reminded that she was out of the department for a prolonged period of time "I had to go home"

## 2019-02-02 NOTE — ED Notes (Signed)
Pt walked out stating she needed a cigarette  She was asked to return to the room that her IV might be removed  She returned to the room the IV was removed, and she left stating her fiance could sign her discharge paperwork

## 2019-02-13 ENCOUNTER — Ambulatory Visit: Payer: Medicaid Other | Admitting: Urology

## 2019-03-27 ENCOUNTER — Ambulatory Visit (INDEPENDENT_AMBULATORY_CARE_PROVIDER_SITE_OTHER): Payer: Medicaid Other | Admitting: Urology

## 2019-03-27 ENCOUNTER — Encounter: Payer: Self-pay | Admitting: Urology

## 2019-03-27 VITALS — BP 106/67 | HR 101 | Temp 98.1°F | Ht 62.0 in | Wt 160.0 lb

## 2019-03-27 DIAGNOSIS — N200 Calculus of kidney: Secondary | ICD-10-CM

## 2019-03-27 LAB — POCT URINALYSIS DIPSTICK
Bilirubin, UA: NEGATIVE
Glucose, UA: NEGATIVE
Ketones, UA: NEGATIVE
Leukocytes, UA: NEGATIVE
Nitrite, UA: NEGATIVE
Protein, UA: NEGATIVE
Spec Grav, UA: 1.02 (ref 1.010–1.025)
Urobilinogen, UA: NEGATIVE E.U./dL — AB
pH, UA: 5 (ref 5.0–8.0)

## 2019-03-27 NOTE — Patient Instructions (Signed)
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
## 2019-04-10 ENCOUNTER — Other Ambulatory Visit: Payer: Self-pay

## 2019-05-12 ENCOUNTER — Encounter (HOSPITAL_COMMUNITY): Payer: Self-pay

## 2019-05-12 ENCOUNTER — Other Ambulatory Visit: Payer: Self-pay

## 2019-05-12 ENCOUNTER — Emergency Department (HOSPITAL_COMMUNITY)
Admission: EM | Admit: 2019-05-12 | Discharge: 2019-05-12 | Disposition: A | Discharge status: Home / Self Care | Payer: Medicaid Other | Attending: Emergency Medicine | Admitting: Emergency Medicine

## 2019-05-12 DIAGNOSIS — L03116 Cellulitis of left lower limb: Secondary | ICD-10-CM | POA: Diagnosis not present

## 2019-05-12 DIAGNOSIS — B353 Tinea pedis: Secondary | ICD-10-CM | POA: Diagnosis not present

## 2019-05-12 DIAGNOSIS — L03115 Cellulitis of right lower limb: Secondary | ICD-10-CM

## 2019-05-12 DIAGNOSIS — F17210 Nicotine dependence, cigarettes, uncomplicated: Secondary | ICD-10-CM | POA: Diagnosis not present

## 2019-05-12 DIAGNOSIS — M79671 Pain in right foot: Secondary | ICD-10-CM | POA: Diagnosis present

## 2019-05-12 DIAGNOSIS — M79672 Pain in left foot: Secondary | ICD-10-CM | POA: Insufficient documentation

## 2019-05-12 LAB — CBG MONITORING, ED: Glucose-Capillary: 149 mg/dL — ABNORMAL HIGH (ref 70–99)

## 2019-05-12 MED ORDER — MICONAZOLE NITRATE 2 % EX POWD: CUTANEOUS | 1 refills | Status: DC

## 2019-05-12 MED ORDER — VANCOMYCIN HCL IN DEXTROSE 1-5 GM/200ML-% IV SOLN
1000.0000 mg | Freq: Once | INTRAVENOUS | Status: AC
  Administered 2019-05-12: 19:00:00 1000 mg via INTRAVENOUS
  Filled 2019-05-12: qty 200

## 2019-05-12 MED ORDER — KETOROLAC TROMETHAMINE 30 MG/ML IJ SOLN
30.0000 mg | Freq: Once | INTRAMUSCULAR | Status: AC
  Administered 2019-05-12: 20:00:00 30 mg via INTRAVENOUS
  Filled 2019-05-12: qty 1

## 2019-05-12 MED ORDER — SULFAMETHOXAZOLE-TRIMETHOPRIM 800-160 MG PO TABS: 1.0000 | ORAL_TABLET | Freq: Two times a day (BID) | ORAL | 0 refills | Status: AC

## 2019-05-12 NOTE — Discharge Instructions (Signed)
Elevate your legs as much as possible.  Take the antibiotic as directed until its finished.  As discussed, wear white colored socks and shoes that are not so tight fitting.  Dry your feet completely after bathing.  You may use a hair dryer on cool setting to dry between your toes.  You may need to treat your feet with the powder for at least 3-4 weeks.  Follow-up with your doctor or return here if symptoms worsen.

## 2019-05-12 NOTE — ED Triage Notes (Signed)
Pt presents to ED with bilateral leg swelling and redness x 2 weeks.
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
## 2019-05-21 ENCOUNTER — Telehealth: Payer: Self-pay | Admitting: Urology

## 2019-05-21 ENCOUNTER — Other Ambulatory Visit: Payer: Self-pay

## 2019-05-21 DIAGNOSIS — N200 Calculus of kidney: Secondary | ICD-10-CM

## 2019-05-21 MED ORDER — TAMSULOSIN HCL 0.4 MG PO CAPS: 0.4000 mg | ORAL_CAPSULE | Freq: Every day | ORAL | 11 refills | Status: DC

## 2019-05-21 NOTE — Telephone Encounter (Signed)
Refill sent.

## 2019-05-21 NOTE — Telephone Encounter (Signed)
Refill on Flomax requested   Thanks

## 2019-06-26 ENCOUNTER — Ambulatory Visit: Payer: Medicaid Other | Admitting: Urology

## 2019-06-30 ENCOUNTER — Other Ambulatory Visit: Payer: Self-pay

## 2019-06-30 ENCOUNTER — Emergency Department (HOSPITAL_COMMUNITY)
Admission: EM | Admit: 2019-06-30 | Discharge: 2019-06-30 | Disposition: A | Discharge status: Home / Self Care | Payer: Medicaid Other | Attending: Emergency Medicine | Admitting: Emergency Medicine

## 2019-06-30 DIAGNOSIS — R462 Strange and inexplicable behavior: Secondary | ICD-10-CM | POA: Diagnosis present

## 2019-06-30 DIAGNOSIS — Z20822 Contact with and (suspected) exposure to covid-19: Secondary | ICD-10-CM | POA: Diagnosis not present

## 2019-06-30 DIAGNOSIS — F259 Schizoaffective disorder, unspecified: Secondary | ICD-10-CM | POA: Diagnosis not present

## 2019-06-30 DIAGNOSIS — F17210 Nicotine dependence, cigarettes, uncomplicated: Secondary | ICD-10-CM | POA: Diagnosis not present

## 2019-06-30 DIAGNOSIS — F319 Bipolar disorder, unspecified: Secondary | ICD-10-CM | POA: Insufficient documentation

## 2019-06-30 DIAGNOSIS — Z79899 Other long term (current) drug therapy: Secondary | ICD-10-CM | POA: Insufficient documentation

## 2019-06-30 DIAGNOSIS — R4689 Other symptoms and signs involving appearance and behavior: Secondary | ICD-10-CM

## 2019-06-30 DIAGNOSIS — J449 Chronic obstructive pulmonary disease, unspecified: Secondary | ICD-10-CM | POA: Insufficient documentation

## 2019-06-30 DIAGNOSIS — E039 Hypothyroidism, unspecified: Secondary | ICD-10-CM | POA: Diagnosis not present

## 2019-06-30 LAB — ACETAMINOPHEN LEVEL: Acetaminophen (Tylenol), Serum: <10 ug/mL — ABNORMAL LOW (ref 10–30)

## 2019-06-30 LAB — CBC WITH DIFFERENTIAL/PLATELET
Abs Immature Granulocytes: 0.03 10*3/uL (ref 0.00–0.07)
Basophils Absolute: 0.1 10*3/uL (ref 0.0–0.1)
Basophils Relative: 1 %
Eosinophils Absolute: 0.2 10*3/uL (ref 0.0–0.5)
Eosinophils Relative: 2 %
HCT: 56.1 % — ABNORMAL HIGH (ref 36.0–46.0)
Hemoglobin: 18 g/dL — ABNORMAL HIGH (ref 12.0–15.0)
Immature Granulocytes: 0 %
Lymphocytes Relative: 26 %
Lymphs Abs: 2.6 10*3/uL (ref 0.7–4.0)
MCH: 29.5 pg (ref 26.0–34.0)
MCHC: 32.1 g/dL (ref 30.0–36.0)
MCV: 91.8 fL (ref 80.0–100.0)
Monocytes Absolute: 0.7 10*3/uL (ref 0.1–1.0)
Monocytes Relative: 7 %
Neutro Abs: 6.3 10*3/uL (ref 1.7–7.7)
Neutrophils Relative %: 64 %
Platelets: 305 10*3/uL (ref 150–400)
RBC: 6.11 MIL/uL — ABNORMAL HIGH (ref 3.87–5.11)
RDW: 15.6 % — ABNORMAL HIGH (ref 11.5–15.5)
WBC: 9.8 10*3/uL (ref 4.0–10.5)
nRBC: 0 % (ref 0.0–0.2)

## 2019-06-30 LAB — PREGNANCY, URINE: Preg Test, Ur: NEGATIVE

## 2019-06-30 LAB — SALICYLATE LEVEL: Salicylate Lvl: 7.6 mg/dL (ref 7.0–30.0)

## 2019-06-30 LAB — COMPREHENSIVE METABOLIC PANEL
ALT: 15 U/L (ref 0–44)
AST: 16 U/L (ref 15–41)
Albumin: 4.2 g/dL (ref 3.5–5.0)
Alkaline Phosphatase: 101 U/L (ref 38–126)
Anion gap: 11 (ref 5–15)
BUN: 22 mg/dL — ABNORMAL HIGH (ref 6–20)
CO2: 20 mmol/L — ABNORMAL LOW (ref 22–32)
Calcium: 9.6 mg/dL (ref 8.9–10.3)
Chloride: 108 mmol/L (ref 98–111)
Creatinine, Ser: 0.68 mg/dL (ref 0.44–1.00)
GFR calc Af Amer: >60 mL/min (ref >60)
GFR calc non Af Amer: >60 mL/min (ref >60)
Glucose, Bld: 102 mg/dL — ABNORMAL HIGH (ref 70–99)
Potassium: 3.8 mmol/L (ref 3.5–5.1)
Sodium: 139 mmol/L (ref 135–145)
Total Bilirubin: 0.2 mg/dL — ABNORMAL LOW (ref 0.3–1.2)
Total Protein: 7.5 g/dL (ref 6.5–8.1)

## 2019-06-30 LAB — RAPID URINE DRUG SCREEN, HOSP PERFORMED
Amphetamines: NOT DETECTED
Barbiturates: NOT DETECTED
Benzodiazepines: NOT DETECTED
Cocaine: NOT DETECTED
Opiates: NOT DETECTED
Tetrahydrocannabinol: NOT DETECTED

## 2019-06-30 LAB — RESPIRATORY PANEL BY RT PCR (FLU A&B, COVID)
Influenza A by PCR: NEGATIVE
Influenza B by PCR: NEGATIVE
SARS Coronavirus 2 by RT PCR: NEGATIVE

## 2019-06-30 LAB — LIPASE, BLOOD: Lipase: 44 U/L (ref 11–51)

## 2019-06-30 LAB — ETHANOL: Alcohol, Ethyl (B): <10 mg/dL (ref <10)

## 2019-06-30 MED ORDER — NICOTINE 21 MG/24HR TD PT24
21.0000 mg | MEDICATED_PATCH | Freq: Once | TRANSDERMAL | Status: DC
  Administered 2019-06-30: 21 mg via TRANSDERMAL
  Filled 2019-06-30: qty 1

## 2019-06-30 MED ORDER — SODIUM CHLORIDE 0.9 % IV BOLUS: 500.0000 mL | Freq: Once | INTRAVENOUS | Status: DC
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
## 2019-06-30 NOTE — ED Triage Notes (Signed)
Patient comes to the ED in police custody.  Patient states she lives with her fiancee, but her mother took out commitment papers.  Mother filled out paper work stating patient has been hallucinating.  Patient states she lives at home, felt concern for her mother, called the police to check on her mom. Patient states then she was arrested and brought to the hospital after involuntary commitment papers were signes.  Patient is calm and cooperative.
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
## 2019-06-30 NOTE — ED Notes (Signed)
Ginger ale and sack lunch provided to pt

## 2019-06-30 NOTE — Discharge Instructions (Addendum)
Follow-up with your psychiatrist regarding your behavior. Make sure you are taking all your medications as prescribed. Return to the emergency room if any new, worsening, concerning symptoms.

## 2019-06-30 NOTE — ED Notes (Signed)
Call from Gene, [place] recommends discharge
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
## 2019-07-15 ENCOUNTER — Inpatient Hospital Stay (HOSPITAL_COMMUNITY)
Admission: AD | Admit: 2019-07-15 | Discharge: 2019-07-18 | DRG: 885 | Disposition: A | Discharge status: Home / Self Care | Payer: Medicaid Other | Attending: Psychiatry | Admitting: Psychiatry

## 2019-07-15 ENCOUNTER — Other Ambulatory Visit: Payer: Self-pay | Admitting: Behavioral Health

## 2019-07-15 ENCOUNTER — Encounter (HOSPITAL_COMMUNITY): Payer: Self-pay | Admitting: Behavioral Health

## 2019-07-15 ENCOUNTER — Other Ambulatory Visit: Payer: Self-pay

## 2019-07-15 DIAGNOSIS — Z8249 Family history of ischemic heart disease and other diseases of the circulatory system: Secondary | ICD-10-CM

## 2019-07-15 DIAGNOSIS — Z85828 Personal history of other malignant neoplasm of skin: Secondary | ICD-10-CM

## 2019-07-15 DIAGNOSIS — M5137 Other intervertebral disc degeneration, lumbosacral region: Secondary | ICD-10-CM | POA: Diagnosis present

## 2019-07-15 DIAGNOSIS — J449 Chronic obstructive pulmonary disease, unspecified: Secondary | ICD-10-CM | POA: Diagnosis present

## 2019-07-15 DIAGNOSIS — R5382 Chronic fatigue, unspecified: Secondary | ICD-10-CM | POA: Diagnosis present

## 2019-07-15 DIAGNOSIS — Z79890 Hormone replacement therapy (postmenopausal): Secondary | ICD-10-CM | POA: Diagnosis not present

## 2019-07-15 DIAGNOSIS — Z8261 Family history of arthritis: Secondary | ICD-10-CM

## 2019-07-15 DIAGNOSIS — Z823 Family history of stroke: Secondary | ICD-10-CM | POA: Diagnosis not present

## 2019-07-15 DIAGNOSIS — E039 Hypothyroidism, unspecified: Secondary | ICD-10-CM | POA: Diagnosis present

## 2019-07-15 DIAGNOSIS — M1990 Unspecified osteoarthritis, unspecified site: Secondary | ICD-10-CM | POA: Diagnosis present

## 2019-07-15 DIAGNOSIS — Z791 Long term (current) use of non-steroidal anti-inflammatories (NSAID): Secondary | ICD-10-CM | POA: Diagnosis not present

## 2019-07-15 DIAGNOSIS — K219 Gastro-esophageal reflux disease without esophagitis: Secondary | ICD-10-CM | POA: Diagnosis present

## 2019-07-15 DIAGNOSIS — Z20822 Contact with and (suspected) exposure to covid-19: Secondary | ICD-10-CM | POA: Diagnosis present

## 2019-07-15 DIAGNOSIS — M797 Fibromyalgia: Secondary | ICD-10-CM | POA: Diagnosis present

## 2019-07-15 DIAGNOSIS — F250 Schizoaffective disorder, bipolar type: Principal | ICD-10-CM | POA: Diagnosis present

## 2019-07-15 DIAGNOSIS — Z809 Family history of malignant neoplasm, unspecified: Secondary | ICD-10-CM

## 2019-07-15 DIAGNOSIS — F17210 Nicotine dependence, cigarettes, uncomplicated: Secondary | ICD-10-CM | POA: Diagnosis present

## 2019-07-15 DIAGNOSIS — Z825 Family history of asthma and other chronic lower respiratory diseases: Secondary | ICD-10-CM

## 2019-07-15 LAB — RESPIRATORY PANEL BY RT PCR (FLU A&B, COVID)
Influenza A by PCR: NEGATIVE
Influenza B by PCR: NEGATIVE
SARS Coronavirus 2 by RT PCR: NEGATIVE

## 2019-07-15 MED ORDER — DIPHENHYDRAMINE HCL 50 MG PO CAPS
50.0000 mg | ORAL_CAPSULE | Freq: Once | ORAL | Status: DC
  Filled 2019-07-15: qty 1

## 2019-07-15 NOTE — Progress Notes (Signed)
Patient ID: Meredith [name], female   DOB: January 11, 1966, 54 y.o.   MRN: [id number] Pt A&O x 4, resting at present, no distress noted, denies SI, HI or AVH.  Monitoring for safety, pt calm & cooperative at present.  Pending transfer to [address] hall.
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
## 2019-07-15 NOTE — Progress Notes (Signed)
Patient meets inpatient criteria per [name], NP. Patient has been faxed out to the following facilities for review:   [facility]  CSW will continue to follow and assist with finding bed placement.   Domenic Schwab, MSW, LCSW-A Clinical Disposition Social Worker [facility]/TTS [phone]

## 2019-07-15 NOTE — Progress Notes (Addendum)
Admission Note:  54 yr female who presents voluntary in no acute distress for the treatment of paranoid thoughts and delusions after being off her psychotropic medications. Pt appears anxious and depressed. Patient is also preoccupied with her thoughts. Pt was calm and cooperative with admission process. Patient denies SI/HI on admission. Skin was assessed and found to be clear of any abnormal marks and intact. Bilateral feet dry and calloused.  Patient and belongings searched and no contraband found, POC and unit policies explained and understanding verbalized. Consents obtained. Food and fluids offered, and accepted. Pt had no additional questions or concerns.

## 2019-07-15 NOTE — Tx Team (Signed)
Initial Treatment Plan 07/15/2019 5:54 PM Meredith [name] [id number]    PATIENT STRESSORS: Financial difficulties Medication change or noncompliance   PATIENT STRENGTHS: General fund of knowledge Motivation for treatment/growth Religious Affiliation   PATIENT IDENTIFIED PROBLEMS: "Anxiety"  "Depression"  "Get back on my medication"                 DISCHARGE CRITERIA:  Improved stabilization in mood, thinking, and/or behavior Motivation to continue treatment in a less acute level of care Safe-care adequate arrangements made  PRELIMINARY DISCHARGE PLAN: Return to previous living arrangement  PATIENT/FAMILY INVOLVEMENT: This treatment plan has been presented to and reviewed with the patient, Meredith [name], and/or family member.  The patient and family have been given the opportunity to ask questions and make suggestions.  Jolene Provost, RN 07/15/2019, 5:54 PM
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
## 2019-07-16 DIAGNOSIS — F250 Schizoaffective disorder, bipolar type: Principal | ICD-10-CM

## 2019-07-16 LAB — LIPID PANEL
Cholesterol: 149 mg/dL (ref 0–200)
HDL: 40 mg/dL — ABNORMAL LOW (ref >40)
LDL Cholesterol: 88 mg/dL (ref 0–99)
Total CHOL/HDL Ratio: 3.7 RATIO
Triglycerides: 103 mg/dL (ref <150)
VLDL: 21 mg/dL (ref 0–40)

## 2019-07-16 LAB — CBC
HCT: 52.3 % — ABNORMAL HIGH (ref 36.0–46.0)
Hemoglobin: 16.3 g/dL — ABNORMAL HIGH (ref 12.0–15.0)
MCH: 29.4 pg (ref 26.0–34.0)
MCHC: 31.2 g/dL (ref 30.0–36.0)
MCV: 94.4 fL (ref 80.0–100.0)
Platelets: 234 10*3/uL (ref 150–400)
RBC: 5.54 MIL/uL — ABNORMAL HIGH (ref 3.87–5.11)
RDW: 16.2 % — ABNORMAL HIGH (ref 11.5–15.5)
WBC: 8.4 10*3/uL (ref 4.0–10.5)
nRBC: 0 % (ref 0.0–0.2)

## 2019-07-16 LAB — COMPREHENSIVE METABOLIC PANEL
ALT: 14 U/L (ref 0–44)
AST: 15 U/L (ref 15–41)
Albumin: 3.7 g/dL (ref 3.5–5.0)
Alkaline Phosphatase: 63 U/L (ref 38–126)
Anion gap: 10 (ref 5–15)
BUN: 21 mg/dL — ABNORMAL HIGH (ref 6–20)
CO2: 25 mmol/L (ref 22–32)
Calcium: 9.1 mg/dL (ref 8.9–10.3)
Chloride: 107 mmol/L (ref 98–111)
Creatinine, Ser: 0.68 mg/dL (ref 0.44–1.00)
GFR calc Af Amer: >60 mL/min (ref >60)
GFR calc non Af Amer: >60 mL/min (ref >60)
Glucose, Bld: 135 mg/dL — ABNORMAL HIGH (ref 70–99)
Potassium: 3.6 mmol/L (ref 3.5–5.1)
Sodium: 142 mmol/L (ref 135–145)
Total Bilirubin: 0.4 mg/dL (ref 0.3–1.2)
Total Protein: 6.4 g/dL — ABNORMAL LOW (ref 6.5–8.1)

## 2019-07-16 LAB — ETHANOL: Alcohol, Ethyl (B): <10 mg/dL (ref <10)

## 2019-07-16 LAB — TSH: TSH: 1.393 u[IU]/mL (ref 0.350–4.500)

## 2019-07-16 MED ORDER — LITHIUM CARBONATE 300 MG PO CAPS
300.0000 mg | ORAL_CAPSULE | Freq: Two times a day (BID) | ORAL | Status: DC
  Administered 2019-07-16 – 2019-07-18 (×5): 300 mg via ORAL
  Filled 2019-07-16 (×11): qty 1

## 2019-07-16 MED ORDER — LURASIDONE HCL 80 MG PO TABS
80.0000 mg | ORAL_TABLET | Freq: Every day | ORAL | Status: DC
  Administered 2019-07-16 – 2019-07-18 (×3): 80 mg via ORAL
  Filled 2019-07-16 (×5): qty 1
  Filled 2019-07-16: qty 2

## 2019-07-16 MED ORDER — ACETAMINOPHEN 325 MG PO TABS
650.0000 mg | ORAL_TABLET | Freq: Four times a day (QID) | ORAL | Status: DC | PRN
  Administered 2019-07-16 – 2019-07-18 (×5): 650 mg via ORAL
  Filled 2019-07-16 (×6): qty 2

## 2019-07-16 MED ORDER — LORAZEPAM 1 MG PO TABS: 1.0000 mg | ORAL_TABLET | ORAL | Status: DC | PRN

## 2019-07-16 MED ORDER — OLANZAPINE 5 MG PO TBDP: 5.0000 mg | ORAL_TABLET | Freq: Three times a day (TID) | ORAL | Status: DC | PRN

## 2019-07-16 MED ORDER — HYDROXYZINE HCL 25 MG PO TABS
25.0000 mg | ORAL_TABLET | Freq: Four times a day (QID) | ORAL | Status: DC | PRN
  Administered 2019-07-16 – 2019-07-17 (×3): 25 mg via ORAL
  Filled 2019-07-16 (×3): qty 1

## 2019-07-16 MED ORDER — TRAZODONE HCL 50 MG PO TABS
50.0000 mg | ORAL_TABLET | Freq: Every evening | ORAL | Status: DC | PRN
  Administered 2019-07-16 – 2019-07-18 (×3): 50 mg via ORAL
  Filled 2019-07-16 (×3): qty 1

## 2019-07-16 MED ORDER — ZIPRASIDONE MESYLATE 20 MG IM SOLR: 20.0000 mg | INTRAMUSCULAR | Status: DC | PRN

## 2019-07-16 NOTE — Progress Notes (Signed)
Patient ID: Meredith [name], female   DOB: 1965-11-26, 54 y.o.   MRN: [id number] Pt admitted to adult unit from observation unit. Pt appears calm and cooperative. Food and drink offered pt accepted. Reports given to pt's nurse.

## 2019-07-16 NOTE — [facility] Suicide Risk Assessment (Signed)
[facility] Admission Suicide Risk Assessment   Nursing information obtained from:  Patient Demographic factors:  Caucasian Current Mental Status:  NA Loss Factors:  Financial problems / change in socioeconomic status Historical Factors:  NA Risk Reduction Factors:  Religious beliefs about death  Total Time spent with patient: 45 minutes Principal Problem: Chronic undertreated psychosis Diagnosis:  Active Problems:   Schizoaffective disorder, bipolar type (HCC)  Subjective Data: Presents with paranoia directed at a specific individual drug screen negative  Continued Clinical Symptoms:  Alcohol Use Disorder Identification Test Final Score (AUDIT): 0 The "Alcohol Use Disorders Identification Test", Guidelines for Use in Primary Care, Second Edition.  World Pharmacologist [facility]). Score between 0-7:  no or low risk or alcohol related problems. Score between 8-15:  moderate risk of alcohol related problems. Score between 16-19:  high risk of alcohol related problems. Score 20 or above:  warrants further diagnostic evaluation for alcohol dependence and treatment.  Musculoskeletal:  Strength & Muscle Tone: within normal limits  Gait & Station: normal  Patient leans: N/A  Psychiatric Specialty Exam:  Physical Exam  Nursing note and vitals reviewed.  Constitutional: She is oriented to person, place, and time. She appears well-developed and well-nourished.  Cardiovascular: Normal rate.  Respiratory: Effort normal.  Neurological: She is alert and oriented to person, place, and time.    Review of Systems  Constitutional: Negative.  Respiratory: Negative for cough and shortness of breath.  Gastrointestinal: Negative for nausea and vomiting.  Psychiatric/Behavioral: Positive for decreased concentration, dysphoric mood and sleep disturbance. Negative for behavioral problems, confusion, hallucinations, self-injury and suicidal ideas. The patient is nervous/anxious. The patient is not  hyperactive.    Blood pressure 130/89, pulse 97, temperature 98 F (36.7 C), temperature source Oral, resp. rate 20, height 5\' 2"  (1.575 m), weight 64.4 kg, SpO2 99 %.Body mass index is 25.97 kg/m.  General Appearance: Disheveled  Eye Contact: Fair  Speech: Pressured  Volume: Decreased  Mood: Anxious and Depressed  Affect: Congruent  Thought Process: Coherent  Orientation: Full (Time, Place, and Person)  Thought Content: Delusions and Paranoid Ideation  Suicidal Thoughts: No  Homicidal Thoughts: No  Memory: Immediate; Fair  Recent; Fair  Remote; Fair  Judgement: Impaired  Insight: Lacking  Psychomotor Activity: Normal  Concentration: Concentration: Fair and Attention Span: Fair  Recall: [employer] of Knowledge: Fair  Language: Good  Akathisia: No  Handed: Right  AIMS (if indicated):   Assets: Communication Skills  Housing  Resilience  Social Support  ADL's: Intact  Cognition: WNL  Sleep: Number of Hours: 7    CLINICAL FACTORS:   Previous Psychiatric Diagnoses and Treatments   COGNITIVE FEATURES THAT CONTRIBUTE TO RISK:  Loss of executive function    SUICIDE RISK:   Mild:  Suicidal ideation of limited frequency, intensity, duration, and specificity.  There are no identifiable plans, no associated intent, mild dysphoria and related symptoms, good self-control (both objective and subjective assessment), few other risk factors, and identifiable protective factors, including available and accessible social support.  PLAN OF CARE: see eval   I certify that inpatient services furnished can reasonably be expected to improve the patient's condition.   Johnn Hai, MD 07/16/2019, 11:09 AM

## 2019-07-16 NOTE — [facility] Counselor (Signed)
CSW attempted to meet with patient to complete assessment and discuss aftercare. Patient requested CSW meet with her at a later time.  CSW will follow up.  Stephanie Acre, MSW, LCSW-A Clinical Social Worker [facility] Adult [facility]

## 2019-07-16 NOTE — Progress Notes (Signed)
   07/16/19 1500  Vital Signs  Temp 99 F (37.2 C)  Temp Source Oral  Resp 16  BP 127/83  BP Location Left Arm  BP Method Automatic  Patient Position (if appropriate) Sitting  Oxygen Therapy  SpO2 100 %  O2 Device Room Air  D:  Pt. Isolating in room.  Pt took meds. Pt. Had a depressed mood and affect Pt. Denied SI/HI and AVH. Marland KitchenA:  Safety maintained.  R: continue to monitor for safety.
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
## 2019-07-16 NOTE — Progress Notes (Signed)
Recreation Therapy Notes  Date: 4.13.21 Time: 1000 Location: 500 Hall Dayroom  Group Topic: Anxiety  Goal Area(s) Addresses:  Patient will identify triggers to anxiety. Patient will identify physical symptoms to anxiety. Patient will identify coping strategies for anxiety that can be used post d/c.  Intervention: Worksheet, pencils  Activity: Introduction to Anxiety.  Patients were to identify three things that trigger their anxiety, three physical symptoms they experience from anxiety, three thoughts they have from anxiety and coping strategies they use for anxiety.  Education: Communication, Discharge Planning  Education Outcome: Acknowledges understanding/In group clarification offered/Needs additional education.   Clinical Observations/Feedback: Pt did not attend group session.    Victorino Sparrow, LRT/CTRS    Victorino Sparrow A 07/16/2019 11:29 AM

## 2019-07-16 NOTE — Progress Notes (Signed)
Recreation Therapy Notes  Patient admitted to unit 4.12.21. Due to admission within last year, no new assessment conducted at this time. Last assessment conducted 7.3.21. Patient stated reason for admission is anxiety and depression.  Patient reports no changes in stressors from previous admission.  Coping skills continue to be tv, music, exercise, meditate, talk and making wreaths.  Leisure interests are still cooking, cleaning and making wreaths however, patient stated she doesn't do these activities anymore.  Patient identified no strengths currently and identified no areas of improvement.   Patient denies SI, HI, AVH at this time. Patient reports goal of "get the hell out of here".     Victorino Sparrow, LRT/CTRS    Victorino Sparrow A 07/16/2019 11:49 AM

## 2019-07-17 DIAGNOSIS — F250 Schizoaffective disorder, bipolar type: Secondary | ICD-10-CM | POA: Diagnosis not present

## 2019-07-17 MED ORDER — ENSURE ENLIVE PO LIQD
237.0000 mL | Freq: Two times a day (BID) | ORAL | Status: DC
  Administered 2019-07-18: 237 mL via ORAL

## 2019-07-17 MED ORDER — METHOCARBAMOL 750 MG PO TABS
750.0000 mg | ORAL_TABLET | Freq: Three times a day (TID) | ORAL | Status: DC
  Administered 2019-07-17 – 2019-07-18 (×4): 750 mg via ORAL
  Filled 2019-07-17 (×9): qty 1

## 2019-07-17 NOTE — Progress Notes (Signed)
Pt presents with flat affect and depressed mood.  Pt denied SI/HI/AVH. RN administered medications as prescribed.  Pt is hopeful Robaxin will help with pt's generalized pain.  No other concerns were identified at this time. RN provided support and reassurance.  RN will continue to monitor and provide support as needed.

## 2019-07-17 NOTE — Progress Notes (Signed)
   07/17/19 2100  Psych Admission Type (Psych Patients Only)  Admission Status Voluntary  Psychosocial Assessment  Patient Complaints Anxiety  Eye Contact Brief  Facial Expression Anxious  Affect Appropriate to circumstance  Speech Logical/coherent  Interaction Minimal  Motor Activity Slow  Appearance/Hygiene Unremarkable  Behavior Characteristics Cooperative  Mood Anxious  Thought Process  Coherency WDL  Content WDL  Delusions WDL  Perception WDL  Hallucination None reported or observed  Judgment Poor  Confusion None  Danger to Self  Current suicidal ideation? Denies  Danger to Others  Danger to Others None reported or observed

## 2019-07-17 NOTE — [facility] Group Notes (Signed)
LCSW Group Therapy Notes 07/17/2019 1:43 PM  Type of Therapy and Topic: Group Therapy: Overcoming Obstacles  Participation Level: Did Not Attend  Description of Group:  In this group patients will be encouraged to explore what they see as obstacles to their own wellness and recovery. They will be guided to discuss their thoughts, feelings, and behaviors related to these obstacles. The group will process together ways to cope with barriers, with attention given to specific choices patients can make. Each patient will be challenged to identify changes they are motivated to make in order to overcome their obstacles. This group will be process-oriented, with patients participating in exploration of their own experiences as well as giving and receiving support and challenge from other group members.  Therapeutic Goals: 1. Patient will identify personal and current obstacles as they relate to admission. 2. Patient will identify barriers that currently interfere with their wellness or overcoming obstacles.  3. Patient will identify feelings, thought process and behaviors related to these barriers. 4. Patient will identify two changes they are willing to make to overcome these obstacles:   Summary of Patient Progress Sleeping, did not attend.  Therapeutic Modalities:  Cognitive Behavioral Therapy Solution Focused Therapy Motivational Interviewing Relapse Prevention Therapy  Stephanie Acre, MSW, [facility] 07/17/2019 1:43 PM

## 2019-07-17 NOTE — Tx Team (Signed)
Interdisciplinary Treatment and Diagnostic Plan Update  07/17/2019 Time of Session: 9:00am Meredith [name] MRN: [id number]  Principal Diagnosis: <principal problem not specified>  Secondary Diagnoses: Active Problems:   Schizoaffective disorder, bipolar type ([place])   Current Medications:  Current Facility-Administered Medications  Medication Dose Route Frequency Provider Last Rate Last Admin  . acetaminophen (TYLENOL) tablet 650 mg  650 mg Oral Q6H PRN Connye Burkitt, NP   650 mg at 07/17/19 0840  . diphenhydrAMINE (BENADRYL) capsule 50 mg  50 mg Oral Once Dixon, Rashaun M, NP      . hydrOXYzine (ATARAX/VISTARIL) tablet 25 mg  25 mg Oral Q6H PRN Connye Burkitt, NP   25 mg at 07/17/19 0841  . lithium carbonate capsule 300 mg  300 mg Oral BID WC Connye Burkitt, NP   300 mg at 07/17/19 0840  . OLANZapine zydis (ZYPREXA) disintegrating tablet 5 mg  5 mg Oral Q8H PRN Connye Burkitt, NP       And  . LORazepam (ATIVAN) tablet 1 mg  1 mg Oral PRN Connye Burkitt, NP       And  . ziprasidone (GEODON) injection 20 mg  20 mg Intramuscular PRN Connye Burkitt, NP      . lurasidone (LATUDA) tablet 80 mg  80 mg Oral Q breakfast Connye Burkitt, NP   80 mg at 07/17/19 0840  . methocarbamol (ROBAXIN) tablet 750 mg  750 mg Oral TID Johnn Hai, MD      . traZODone (DESYREL) tablet 50 mg  50 mg Oral QHS PRN,MR X 1 Connye Burkitt, NP   50 mg at 07/16/19 2104   PTA Medications: Medications Prior to Admission  Medication Sig Dispense Refill Last Dose  . albuterol (PROVENTIL HFA;VENTOLIN HFA) 108 (90 Base) MCG/ACT inhaler Inhale 2 puffs into the lungs every 6 (six) hours as needed for wheezing or shortness of breath.   Not Taking at Unknown time  . ARIPiprazole ER (ABILIFY MAINTENA) 400 MG SRER injection Inject 2 mLs (400 mg total) into the muscle every 28 (twenty-eight) days. Due 8/7 (Patient not taking: Reported on 07/16/2019) 1 each 11 Not Taking at Unknown time  . EPINEPHrine 0.3 mg/0.3 mL IJ SOAJ injection  Inject 0.3 mg into the muscle daily as needed (for anaphylatic allergic reactions).    Not Taking at Unknown time  . levothyroxine (SYNTHROID, LEVOTHROID) 25 MCG tablet Take 25 mcg by mouth daily.    Not Taking at Unknown time  . meloxicam (MOBIC) 15 MG tablet Take 15 mg by mouth daily.   Not Taking at Unknown time  . pregabalin (LYRICA) 50 MG capsule Take 50 mg by mouth 3 (three) times daily.    Not Taking at Unknown time  . SPIRIVA HANDIHALER 18 MCG inhalation capsule INHALE 1 CAPSULE BY MOUTHTDAILY.M   Not Taking at Unknown time  . SUBOXONE 8-2 MG FILM SMARTSIG:3 Strip(s) By Mouth Daily   Not Taking at Unknown time    Patient Stressors: Financial difficulties Medication change or noncompliance  Patient Strengths: Technical sales engineer for treatment/growth Religious Affiliation  Treatment Modalities: Medication Management, Group therapy, Case management,  1 to 1 session with clinician, Psychoeducation, Recreational therapy.   Physician Treatment Plan for Primary Diagnosis: <principal problem not specified> Long Term Goal(s): Improvement in symptoms so as ready for discharge   Short Term Goals: Ability to demonstrate self-control will improve Ability to identify and develop effective coping behaviors will improve Ability to identify changes in  lifestyle to reduce recurrence of condition will improve Ability to verbalize feelings will improve Ability to disclose and discuss suicidal ideas  Medication Management: Evaluate patient's response, side effects, and tolerance of medication regimen.  Therapeutic Interventions: 1 to 1 sessions, Unit Group sessions and Medication administration.  Evaluation of Outcomes: Not Met  Physician Treatment Plan for Secondary Diagnosis: Active Problems:   Schizoaffective disorder, bipolar type ([place])  Long Term Goal(s): Improvement in symptoms so as ready for discharge   Short Term Goals: Ability to demonstrate self-control will  improve Ability to identify and develop effective coping behaviors will improve Ability to identify changes in lifestyle to reduce recurrence of condition will improve Ability to verbalize feelings will improve Ability to disclose and discuss suicidal ideas     Medication Management: Evaluate patient's response, side effects, and tolerance of medication regimen.  Therapeutic Interventions: 1 to 1 sessions, Unit Group sessions and Medication administration.  Evaluation of Outcomes: Not Met   RN Treatment Plan for Primary Diagnosis: <principal problem not specified> Long Term Goal(s): Knowledge of disease and therapeutic regimen to maintain health will improve  Short Term Goals: Ability to participate in decision making will improve, Ability to verbalize feelings will improve, Ability to identify and develop effective coping behaviors will improve and Compliance with prescribed medications will improve  Medication Management: RN will administer medications as ordered by provider, will assess and evaluate patient's response and provide education to patient for prescribed medication. RN will report any adverse and/or side effects to prescribing provider.  Therapeutic Interventions: 1 on 1 counseling sessions, Psychoeducation, Medication administration, Evaluate responses to treatment, Monitor vital signs and CBGs as ordered, Perform/monitor CIWA, COWS, AIMS and Fall Risk screenings as ordered, Perform wound care treatments as ordered.  Evaluation of Outcomes: Not Met   LCSW Treatment Plan for Primary Diagnosis: <principal problem not specified> Long Term Goal(s): Safe transition to appropriate next level of care at discharge, Engage patient in therapeutic group addressing interpersonal concerns.  Short Term Goals: Engage patient in aftercare planning with referrals and resources, Increase social support, Identify triggers associated with mental health/substance abuse issues and Increase  skills for wellness and recovery  Therapeutic Interventions: Assess for all discharge needs, 1 to 1 time with Social worker, Explore available resources and support systems, Assess for adequacy in community support network, Educate family and significant other(s) on suicide prevention, Complete Psychosocial Assessment, Interpersonal group therapy.  Evaluation of Outcomes: Not Met  Progress in Treatment: Attending groups: No. Participating in groups: No. Taking medication as prescribed: Yes. Toleration medication: Yes. Family/Significant other contact made: No, will contact:  supports if consents are granted. Patient understands diagnosis: No. Discussing patient identified problems/goals with staff: No. Medical problems stabilized or resolved: Yes. Denies suicidal/homicidal ideation: Yes. Issues/concerns per patient self-inventory: No.  New problem(s) identified: No, Describe:  CSW continuing to assess  New Short Term/Long Term Goal(s): detox, medication management for mood stabilization; elimination of SI thoughts; development of comprehensive mental wellness/sobriety plan.  Patient Goals:  Patient declined to participate in treatment team  Discharge Plan or Barriers: Patient recently admitted to unit, CSW assessing for appropriate referrals  Reason for Continuation of Hospitalization: Anxiety Delusions  Depression Medication stabilization  Estimated Length of Stay: 3-5 days  Attendees: Patient: 07/17/2019 10:02 AM  Physician: Dr.Farah 07/17/2019 10:02 AM  Nursing:  07/17/2019 10:02 AM  RN Care Manager: 07/17/2019 10:02 AM  Social Worker: Stephanie Acre, [place] 07/17/2019 10:02 AM  Recreational Therapist:  07/17/2019 10:02 AM  Other:  07/17/2019 10:02  AM  Other:  07/17/2019 10:02 AM  Other: 07/17/2019 10:02 AM    Scribe for Treatment Team: Joellen Jersey, [place] 07/17/2019 10:02 AM
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
## 2019-07-17 NOTE — H&P (Signed)
NUTRITION ASSESSMENT  Pt identified as at risk on the Malnutrition Screen Tool  INTERVENTION: Ensure Enlive po BID, each supplement provides 350 kcal and 20 grams of protein  NUTRITION DIAGNOSIS: Unintentional weight loss related to sub-optimal intake as evidenced by pt report of fair appetite   Goal: Pt to meet >/= 90% of their estimated nutrition needs.  Monitor:  PO intake  Assessment:  RD working remotely.  54 y.o. female with history of COPD, chronic fatigue syndrome, GERD, hypothyroidism, chronic back pain, fibromyalgia, history of polysubstance abuse, somatic delusions, schizoaffective disorder with multiple prior [facility] hospitalizations presented as a walk-in reporting symptoms of paranoia and admitted on 4/12 with paranoid schizophrenia.  Per notes, patient reports having a fair appetite. Weight history reviewed, noted 7 lb (4.7%) weight loss in the past 3 weeks which is significant for time frame and pt is at risk for malnutrition. Will provide Ensure BID to aid with estimated needs. Pt is also offered choice of unit snacks mid-morning and mid-afternoon.   Height: Ht Readings from Last 1 Encounters:  07/15/19 5\' 2"  (1.575 m)    Weight: Wt Readings from Last 1 Encounters:  07/15/19 64.4 kg    Weight Hx: Wt Readings from Last 10 Encounters:  07/15/19 64.4 kg  06/30/19 67.6 kg  05/12/19 67.6 kg  03/27/19 72.6 kg  02/02/19 66.2 kg  01/22/19 67.9 kg  12/25/18 73.5 kg  12/22/18 73.5 kg  11/02/18 77.1 kg  10/21/18 72.6 kg    BMI:  Body mass index is 25.97 kg/m. Pt meets criteria for overweight based on current BMI.  Estimated Nutritional Needs: Kcal: 25-30 kcal/kg Protein: > 1 gram protein/kg Fluid: 1 ml/kcal  Diet Order:  Diet Order            Diet regular Room service appropriate? No; Fluid consistency: Thin; Fluid restriction: 2000 mL Fluid  Diet effective now             Lab results and medications reviewed.   Lajuan Lines, RD, LDN Clinical  Nutrition After Hours/Weekend Pager # in [place]
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
## 2019-07-17 NOTE — Progress Notes (Signed)
Adult Psychoeducational Group Note  Date:  07/17/2019 Time:  9:05 PM  Group Topic/Focus:  Wrap-Up Group:   The focus of this group is to help patients review their daily goal of treatment and discuss progress on daily workbooks.  Participation Level:  Minimal  Participation Quality:  Appropriate  Affect:  Appropriate  Cognitive:  Appropriate  Insight: Improving  Engagement in Group:  Engaged  Modes of Intervention:  Education  Additional Comments:  Patient attended and participated in group tonight. She reports that today she spoke with a doctor, spoke with a Education officer, museum and took her medication.  Salley Scarlet [facility] 07/17/2019, 9:05 PM

## 2019-07-18 DIAGNOSIS — F250 Schizoaffective disorder, bipolar type: Secondary | ICD-10-CM | POA: Diagnosis not present

## 2019-07-18 LAB — PROLACTIN: Prolactin: 20.6 ng/mL (ref 4.8–23.3)

## 2019-07-18 LAB — HEMOGLOBIN A1C
Hgb A1c MFr Bld: 5.7 % — ABNORMAL HIGH (ref 4.8–5.6)
Mean Plasma Glucose: 117 mg/dL

## 2019-07-18 MED ORDER — ARIPIPRAZOLE ER 400 MG IM SRER
400.0000 mg | INTRAMUSCULAR | Status: DC
  Administered 2019-07-18: 11:00:00 400 mg via INTRAMUSCULAR

## 2019-07-18 MED ORDER — ARIPIPRAZOLE ER 400 MG IM SRER: 400.0000 mg | INTRAMUSCULAR | 11 refills | Status: DC

## 2019-07-18 MED ORDER — LURASIDONE HCL 80 MG PO TABS: 80.0000 mg | ORAL_TABLET | Freq: Every day | ORAL | 0 refills | Status: DC

## 2019-07-18 MED ORDER — LITHIUM CARBONATE 300 MG PO CAPS: 300.0000 mg | ORAL_CAPSULE | Freq: Two times a day (BID) | ORAL | 2 refills | Status: DC

## 2019-07-18 NOTE — Progress Notes (Signed)
  [facility] Adult Case Management Discharge Plan :  Will you be returning to the same living situation after discharge:  Yes,  Trailer w/ fiance At discharge, do you have transportation home?: Yes,  fiance will pick up at 1230pm Do you have the ability to pay for your medications: Yes, Medicaid Release of information consent forms completed and in the chart;  Patient's signature needed at discharge.  Patient to Follow up at: Follow-up [facility] Follow up on 08/02/2019.   Why: You are scheduled for an appointment on 08/02/19 at 2:00 pm with Bernita Raisin.  This will be a virtual appointment only.   For future reference, their new location is:  [address] [place], [place] [address] Contact information: [place] [address] [phone]           Next level of care provider has access to [place] and Suicide Prevention discussed: Yes,  With fiance     Has patient been referred to the Quitline?: N/A patient is not a smoker  Patient has been referred for addiction treatment: Yes  Bethann Berkshire, [place] 07/18/2019, 9:49 AM

## 2019-07-18 NOTE — Plan of Care (Signed)
Discharge note  Patient verbalizes readiness for discharge. Follow up plan explained, AVS, Transition record and SRA given. Prescriptions and teaching provided. Belongings returned and signed for. Suicide safety plan completed and signed. Patient verbalizes understanding. Patient denies SI/HI and assures this Probation officer they will seek assistance should that change. Patient discharged to lobby where husband was waiting.  Problem: Activity: Goal: Will verbalize the importance of balancing activity with adequate rest periods Outcome: Adequate for Discharge   Problem: Education: Goal: Will be free of psychotic symptoms Outcome: Adequate for Discharge Goal: Knowledge of the prescribed therapeutic regimen will improve Outcome: Adequate for Discharge   Problem: Coping: Goal: Coping ability will improve Outcome: Adequate for Discharge Goal: Will verbalize feelings Outcome: Adequate for Discharge   Problem: Health Behavior/Discharge Planning: Goal: Compliance with prescribed medication regimen will improve Outcome: Adequate for Discharge   Problem: Nutritional: Goal: Ability to achieve adequate nutritional intake will improve Outcome: Adequate for Discharge   Problem: Role Relationship: Goal: Ability to communicate needs accurately will improve Outcome: Adequate for Discharge Goal: Ability to interact with others will improve Outcome: Adequate for Discharge   Problem: Safety: Goal: Ability to redirect hostility and anger into socially appropriate behaviors will improve Outcome: Adequate for Discharge Goal: Ability to remain free from injury will improve Outcome: Adequate for Discharge   Problem: Self-Care: Goal: Ability to participate in self-care as condition permits will improve Outcome: Adequate for Discharge   Problem: Self-Concept: Goal: Will verbalize positive feelings about self Outcome: Adequate for Discharge   Problem: Education: Goal: Knowledge of [place] [facility]  Education information/materials will improve Outcome: Adequate for Discharge Goal: Emotional status will improve Outcome: Adequate for Discharge Goal: Mental status will improve Outcome: Adequate for Discharge Goal: Verbalization of understanding the information provided will improve Outcome: Adequate for Discharge   Problem: Activity: Goal: Interest or engagement in activities will improve Outcome: Adequate for Discharge Goal: Sleeping patterns will improve Outcome: Adequate for Discharge   Problem: Coping: Goal: Ability to verbalize frustrations and anger appropriately will improve Outcome: Adequate for Discharge Goal: Ability to demonstrate self-control will improve Outcome: Adequate for Discharge   Problem: Health Behavior/Discharge Planning: Goal: Identification of resources available to assist in meeting health care needs will improve Outcome: Adequate for Discharge Goal: Compliance with treatment plan for underlying cause of condition will improve Outcome: Adequate for Discharge   Problem: Physical Regulation: Goal: Ability to maintain clinical measurements within normal limits will improve Outcome: Adequate for Discharge   Problem: Safety: Goal: Periods of time without injury will increase Outcome: Adequate for Discharge   Problem: Education: Goal: Ability to make informed decisions regarding treatment will improve Outcome: Adequate for Discharge   Problem: Coping: Goal: Coping ability will improve Outcome: Adequate for Discharge   Problem: Health Behavior/Discharge Planning: Goal: Identification of resources available to assist in meeting health care needs will improve Outcome: Adequate for Discharge   Problem: Medication: Goal: Compliance with prescribed medication regimen will improve Outcome: Adequate for Discharge   Problem: Self-Concept: Goal: Ability to disclose and discuss suicidal ideas will improve Outcome: Adequate for Discharge Goal: Will  verbalize positive feelings about self Outcome: Adequate for Discharge   Problem: Education: Goal: Utilization of techniques to improve thought processes will improve Outcome: Adequate for Discharge Goal: Knowledge of the prescribed therapeutic regimen will improve Outcome: Adequate for Discharge   Problem: Activity: Goal: Interest or engagement in leisure activities will improve Outcome: Adequate for Discharge Goal: Imbalance in normal sleep/wake cycle will improve Outcome: Adequate for Discharge  Problem: Coping: Goal: Coping ability will improve Outcome: Adequate for Discharge Goal: Will verbalize feelings Outcome: Adequate for Discharge   Problem: Health Behavior/Discharge Planning: Goal: Ability to make decisions will improve Outcome: Adequate for Discharge Goal: Compliance with therapeutic regimen will improve Outcome: Adequate for Discharge   Problem: Role Relationship: Goal: Will demonstrate positive changes in social behaviors and relationships Outcome: Adequate for Discharge   Problem: Safety: Goal: Ability to disclose and discuss suicidal ideas will improve Outcome: Adequate for Discharge Goal: Ability to identify and utilize support systems that promote safety will improve Outcome: Adequate for Discharge   Problem: Self-Concept: Goal: Will verbalize positive feelings about self Outcome: Adequate for Discharge Goal: Level of anxiety will decrease Outcome: Adequate for Discharge

## 2019-07-18 NOTE — BHH Suicide Risk Assessment (Signed)
[facility] Discharge Suicide Risk Assessment   Principal Problem:psychosis  Discharge Diagnoses: Active Problems:   Schizoaffective disorder, bipolar type ([place])   Total Time spent with patient: 45 minutes  Musculoskeletal: Strength & Muscle Tone: within normal limits Gait & Station: normal Patient leans: N/A  Psychiatric Specialty Exam: Review of Systems  Blood pressure 118/88, pulse 90, temperature 98.3 F (36.8 C), temperature source Oral, resp. rate 18, height 5\' 2"  (1.575 m), weight 64.4 kg, SpO2 100 %.Body mass index is 25.97 kg/m.  General Appearance: Casual  Eye Contact::  Poor  Speech:  Slow409  Volume:  Decreased  Mood:  Euthymic  Affect:  Restricted  Thought Process:  Goal Directed  Orientation:  Full (Time, Place, and Person)  Thought Content:  Paranoid Ideation/reduced  Suicidal Thoughts:  No  Homicidal Thoughts:  No  Memory:  Immediate;   Fair Recent;   Fair Remote;   Fair  Judgement:  Poor  Insight:  Shallow  Psychomotor Activity:  Decreased  Concentration:  Fair  Recall:  [employer] of Knowledge:Fair  Language: Fair  Akathisia:  Negative  Handed:  Right  AIMS (if indicated):     Assets:  Communication Skills Desire for Improvement  Sleep:  Number of Hours: 6.25  Cognition: WNL  ADL's:  Intact   Mental Status Per Nursing Assessment::   On Admission:  NA  Demographic Factors:  Unemployed  Loss Factors: Decrease in vocational status  Historical Factors: NA  Risk Reduction Factors:   Sense of responsibility to family  Continued Clinical Symptoms:  Previous Psychiatric Diagnoses and Treatments  Cognitive Features That Contribute To Risk:  Loss of executive function    Suicide Risk:  Minimal: No identifiable suicidal ideation.  Patients presenting with no risk factors but with morbid ruminations; may be classified as minimal risk based on the severity of the depressive symptoms  Follow-up [facility]  Follow up on 08/02/2019.   Why: You are scheduled for an appointment on 08/02/19 at 2:00 pm with Bernita Raisin.  This will be a virtual appointment only.   For future reference, their new location is:  [address], [place], [place] [address] Contact information: [place] [address] [phone]           Plan Of Care/Follow-up recommendations:  Activity:  full  Kienna Moncada, MD 07/18/2019, 8:30 AM
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
## 2019-07-23 ENCOUNTER — Ambulatory Visit (INDEPENDENT_AMBULATORY_CARE_PROVIDER_SITE_OTHER): Payer: Medicaid Other | Admitting: Internal Medicine

## 2019-09-09 ENCOUNTER — Encounter: Payer: Self-pay | Admitting: Family Medicine

## 2019-09-09 ENCOUNTER — Other Ambulatory Visit: Payer: Self-pay

## 2019-09-09 ENCOUNTER — Ambulatory Visit (INDEPENDENT_AMBULATORY_CARE_PROVIDER_SITE_OTHER): Payer: Medicaid Other | Admitting: Family Medicine

## 2019-09-09 VITALS — BP 142/88 | HR 83 | Ht 63.0 in | Wt 142.0 lb

## 2019-09-09 DIAGNOSIS — R0789 Other chest pain: Secondary | ICD-10-CM

## 2019-09-09 DIAGNOSIS — Z720 Tobacco use: Secondary | ICD-10-CM

## 2019-09-09 DIAGNOSIS — R600 Localized edema: Secondary | ICD-10-CM | POA: Diagnosis not present

## 2019-09-09 DIAGNOSIS — K219 Gastro-esophageal reflux disease without esophagitis: Secondary | ICD-10-CM | POA: Diagnosis not present

## 2019-09-09 MED ORDER — ESOMEPRAZOLE MAGNESIUM 40 MG PO CPDR: 40.0000 mg | DELAYED_RELEASE_CAPSULE | Freq: Every day | ORAL | 6 refills | Status: DC
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
## 2019-09-09 NOTE — Patient Instructions (Addendum)
Medication Instructions:   Your physician has recommended you make the following change in your medication:   Start nexium 40 mg by mouth daily  Continue other medications the same  Labwork:  NONE  Testing/Procedures:  NONE  Follow-Up:  Your physician recommends that you schedule a follow-up appointment in: 1 year (office). You will receive a reminder letter in the mail in about 10 months reminding you to call and schedule your appointment. If you don't receive this letter, please contact our office.   Any Other Special Instructions Will Be Listed Below (If Applicable).  If you need a refill on your cardiac medications before your next appointment, please call your pharmacy.

## 2019-12-24 ENCOUNTER — Other Ambulatory Visit: Payer: Medicaid Other

## 2019-12-24 ENCOUNTER — Other Ambulatory Visit: Payer: Self-pay | Admitting: Critical Care Medicine

## 2019-12-24 DIAGNOSIS — Z20822 Contact with and (suspected) exposure to covid-19: Secondary | ICD-10-CM

## 2019-12-26 LAB — SPECIMEN STATUS REPORT

## 2019-12-26 LAB — SARS-COV-2, NAA 2 DAY TAT

## 2019-12-26 LAB — NOVEL CORONAVIRUS, NAA: SARS-CoV-2, NAA: NOT DETECTED

## 2020-01-06 ENCOUNTER — Ambulatory Visit (INDEPENDENT_AMBULATORY_CARE_PROVIDER_SITE_OTHER): Payer: Medicaid Other | Admitting: Gastroenterology

## 2020-01-06 ENCOUNTER — Other Ambulatory Visit: Payer: Self-pay

## 2020-01-06 ENCOUNTER — Encounter (INDEPENDENT_AMBULATORY_CARE_PROVIDER_SITE_OTHER): Payer: Self-pay | Admitting: Gastroenterology

## 2020-01-06 VITALS — BP 123/80 | HR 80 | Temp 98.1°F | Ht 63.0 in | Wt 166.3 lb

## 2020-01-06 DIAGNOSIS — T402X5A Adverse effect of other opioids, initial encounter: Secondary | ICD-10-CM

## 2020-01-06 DIAGNOSIS — K5903 Drug induced constipation: Secondary | ICD-10-CM | POA: Diagnosis not present

## 2020-01-06 DIAGNOSIS — R112 Nausea with vomiting, unspecified: Secondary | ICD-10-CM | POA: Diagnosis not present

## 2020-01-06 DIAGNOSIS — K2100 Gastro-esophageal reflux disease with esophagitis, without bleeding: Secondary | ICD-10-CM | POA: Diagnosis not present

## 2020-01-06 MED ORDER — ESOMEPRAZOLE MAGNESIUM 40 MG PO CPDR: 40.0000 mg | DELAYED_RELEASE_CAPSULE | Freq: Every day | ORAL | 6 refills | Status: DC

## 2020-01-06 MED ORDER — ONDANSETRON 8 MG PO TBDP: 8.0000 mg | ORAL_TABLET | Freq: Two times a day (BID) | ORAL | 1 refills | Status: DC | PRN

## 2020-01-06 MED ORDER — NALOXEGOL OXALATE 12.5 MG PO TABS: 12.5000 mg | ORAL_TABLET | Freq: Every day | ORAL | 1 refills | Status: DC
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
## 2020-01-06 NOTE — Patient Instructions (Signed)
Continue Nexium 40 mg qday Continue Movantiq 12.5 mg qday Continue Zofran 8 mg q12h as needed for nausea/vomiting

## 2020-01-20 ENCOUNTER — Emergency Department (HOSPITAL_COMMUNITY)
Admission: EM | Admit: 2020-01-20 | Discharge: 2020-01-22 | Disposition: A | Discharge status: Other Acute Inpatient Hospital | Payer: Medicaid Other | Attending: Emergency Medicine | Admitting: Emergency Medicine

## 2020-01-20 ENCOUNTER — Other Ambulatory Visit: Payer: Self-pay

## 2020-01-20 ENCOUNTER — Emergency Department (HOSPITAL_COMMUNITY): Payer: Medicaid Other

## 2020-01-20 ENCOUNTER — Emergency Department (HOSPITAL_COMMUNITY)
Admission: EM | Admit: 2020-01-20 | Discharge: 2020-01-20 | Disposition: A | Discharge status: Home / Self Care | Payer: Medicaid Other

## 2020-01-20 ENCOUNTER — Encounter (HOSPITAL_COMMUNITY): Payer: Self-pay

## 2020-01-20 DIAGNOSIS — J449 Chronic obstructive pulmonary disease, unspecified: Secondary | ICD-10-CM | POA: Diagnosis not present

## 2020-01-20 DIAGNOSIS — J45909 Unspecified asthma, uncomplicated: Secondary | ICD-10-CM | POA: Diagnosis not present

## 2020-01-20 DIAGNOSIS — F17210 Nicotine dependence, cigarettes, uncomplicated: Secondary | ICD-10-CM | POA: Diagnosis not present

## 2020-01-20 DIAGNOSIS — Z20822 Contact with and (suspected) exposure to covid-19: Secondary | ICD-10-CM | POA: Diagnosis not present

## 2020-01-20 DIAGNOSIS — F312 Bipolar disorder, current episode manic severe with psychotic features: Secondary | ICD-10-CM | POA: Diagnosis not present

## 2020-01-20 DIAGNOSIS — Z79899 Other long term (current) drug therapy: Secondary | ICD-10-CM | POA: Insufficient documentation

## 2020-01-20 DIAGNOSIS — R440 Auditory hallucinations: Secondary | ICD-10-CM | POA: Diagnosis not present

## 2020-01-20 DIAGNOSIS — E039 Hypothyroidism, unspecified: Secondary | ICD-10-CM | POA: Diagnosis not present

## 2020-01-20 LAB — CBC WITH DIFFERENTIAL/PLATELET
Abs Immature Granulocytes: 0.05 10*3/uL (ref 0.00–0.07)
Basophils Absolute: 0 10*3/uL (ref 0.0–0.1)
Basophils Relative: 0 %
Eosinophils Absolute: 0 10*3/uL (ref 0.0–0.5)
Eosinophils Relative: 0 %
HCT: 52.5 % — ABNORMAL HIGH (ref 36.0–46.0)
Hemoglobin: 17.3 g/dL — ABNORMAL HIGH (ref 12.0–15.0)
Immature Granulocytes: 0 %
Lymphocytes Relative: 9 %
Lymphs Abs: 1.2 10*3/uL (ref 0.7–4.0)
MCH: 30.8 pg (ref 26.0–34.0)
MCHC: 33 g/dL (ref 30.0–36.0)
MCV: 93.4 fL (ref 80.0–100.0)
Monocytes Absolute: 0.8 10*3/uL (ref 0.1–1.0)
Monocytes Relative: 5 %
Neutro Abs: 12.3 10*3/uL — ABNORMAL HIGH (ref 1.7–7.7)
Neutrophils Relative %: 86 %
Platelets: 340 10*3/uL (ref 150–400)
RBC: 5.62 MIL/uL — ABNORMAL HIGH (ref 3.87–5.11)
RDW: 15 % (ref 11.5–15.5)
WBC: 14.4 10*3/uL — ABNORMAL HIGH (ref 4.0–10.5)
nRBC: 0 % (ref 0.0–0.2)

## 2020-01-20 LAB — COMPREHENSIVE METABOLIC PANEL
ALT: 15 U/L (ref 0–44)
AST: 21 U/L (ref 15–41)
Albumin: 4.6 g/dL (ref 3.5–5.0)
Alkaline Phosphatase: 110 U/L (ref 38–126)
Anion gap: 17 — ABNORMAL HIGH (ref 5–15)
BUN: 18 mg/dL (ref 6–20)
CO2: 20 mmol/L — ABNORMAL LOW (ref 22–32)
Calcium: 9.6 mg/dL (ref 8.9–10.3)
Chloride: 99 mmol/L (ref 98–111)
Creatinine, Ser: 0.59 mg/dL (ref 0.44–1.00)
GFR, Estimated: >60 mL/min (ref >60)
Glucose, Bld: 117 mg/dL — ABNORMAL HIGH (ref 70–99)
Potassium: 3.4 mmol/L — ABNORMAL LOW (ref 3.5–5.1)
Sodium: 136 mmol/L (ref 135–145)
Total Bilirubin: 1 mg/dL (ref 0.3–1.2)
Total Protein: 8.2 g/dL — ABNORMAL HIGH (ref 6.5–8.1)

## 2020-01-20 LAB — ETHANOL: Alcohol, Ethyl (B): <10 mg/dL (ref <10)

## 2020-01-20 MED ORDER — ZIPRASIDONE MESYLATE 20 MG IM SOLR
20.0000 mg | Freq: Once | INTRAMUSCULAR | Status: AC
  Administered 2020-01-20: 20 mg via INTRAMUSCULAR
  Filled 2020-01-20: qty 20

## 2020-01-20 MED ORDER — STERILE WATER FOR INJECTION IJ SOLN
INTRAMUSCULAR | Status: AC
  Administered 2020-01-20: 1 mL
  Filled 2020-01-20: qty 10
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
## 2020-01-20 NOTE — ED Provider Notes (Signed)
Patient is becoming agitated and trying to leave.  She is now handcuffed to the railing by police.  She is acutely psychotic and thus will need medical control with IM [place].   Sherwood Gambler, MD 01/20/20 2006
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
## 2020-01-20 NOTE — ED Notes (Signed)
Pt attempting to run down the hall towards the emergency room exit. Pt stopped by [employer] in the hallway and instructed to return to her room. Pt was placed in 1 forensic restraint on her left wrist. Pt swinging her arm up and down trying to remove her wrist from the restraint. MD notified.

## 2020-01-21 LAB — RAPID URINE DRUG SCREEN, HOSP PERFORMED
Amphetamines: NOT DETECTED
Barbiturates: NOT DETECTED
Benzodiazepines: NOT DETECTED
Cocaine: NOT DETECTED
Opiates: NOT DETECTED
Tetrahydrocannabinol: NOT DETECTED

## 2020-01-21 LAB — POC URINE PREG, ED: Preg Test, Ur: NEGATIVE

## 2020-01-21 NOTE — ED Notes (Signed)
Officer at bedside attempted to remove leg shackle but pt then tried to leave so shckle was replaced. Will continue to monitor.

## 2020-01-21 NOTE — Progress Notes (Signed)
CSW was informed by [facility], RN that [facility] currently does not have any appropriate beds for placement at this time.  CSW to follow up.  [place] Disposition, CSW [phone] (cell)
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
## 2020-01-21 NOTE — BH Assessment (Signed)
Comprehensive Clinical Assessment (CCA) Note  01/21/2020 Meredith [name] [id number]  Visit Diagnosis: F31.2, Bipolar I disorder, Current or most recent episode manic, With psychotic features    ICD-10-CM   1. Auditory hallucinations  R44.0      CCA Screening, Triage and Referral (STR) Daniella Dewberry is a 54 year old pt who was brought to [facility] via the [facility]) after her fiance called them for assistance after he and pt got into an argument, pt ran away from the home, ran through some woods, and apparently attempted to hide from the [place] in a pond. Pt reported to the [facility] that she was experiencing AH; she denied SI and HI.  When asked why she was at the hospital, pt told clinician, "I ran out of the house. He called the Sheriff's Department to come find me." Pt was minimal with the information she provided and with her answers; she denies SI, HI, AVH, NSSIB, access to guns/weapons, engagement with the legal system, or SA. Pt's UDA has not yet returned for verification of pt's use of substances.  Pt states she has been in the hospital in the past for mental health concerns, though she denies she's ever attempted to kill herself. She denies she currently has a plan to kill herself or that she's ever attempted to kill herself. Pt states she lives with her husband, is currently unemployed, and states she has no contact with her family. Pt states she would like to be d/c and return home, stating she feels she can contract for safety.  Pt eloped from the ED at least once and attempted to elope from the ED at least once more and required physical and medicinal restraints for her safety.  Pt's orientation and memory was UTA. Pt was cooperative throughout the assessment process, though her voice tone was low and slurred and her voice volume was soft. Pt's insight, judgement, and impulse control are poor at this time.   Recommendations for  Services/Supports/Treatments: Lindon Romp, NP, reviewed pt's chart and information and determined pt meets inpatient criteria. This information was provided to the [place], Koren Shiver RN, at [phone]. Pt's providers were given this information at 0257.   Patient Reported Information How did you hear about [place]? Other (Comment) ([place] Department)  Referral name: [facility] Department  Referral phone number: No data recorded  Whom do you see for routine medical problems? Other (Comment) (UTA)  Practice/Facility Name: No data recorded Practice/Facility Phone Number: No data recorded Name of Contact: No data recorded Contact Number: No data recorded Contact Fax Number: No data recorded Prescriber Name: No data recorded Prescriber Address (if known): No data recorded  What Is the Reason for Your Visit/Call Today? Pt states, "I ran out of the house. (My fiance) called the Sheriff's Department to come find me."  How Long Has This Been Causing You Problems? <Week  What Do You Feel Would Help You the Most Today? Assessment Only   Have You Recently Been in Any Inpatient Treatment (Hospital/Detox/Crisis Center/28-Day Program)? No  Name/Location of Program/Hospital:No data recorded How Long Were You There? No data recorded When Were You Discharged? No data recorded  Have You Ever Received Services From [facility] Before? No  Who Do You See at [facility]? No data recorded  Have You Recently Had Any Thoughts About Hurting Yourself? No  Are You Planning to Commit Suicide/Harm Yourself At This time? No   Have you Recently Had Thoughts About Hurting Someone  Else? No  Explanation: No data recorded  Have You Used Any Alcohol or Drugs in the Past 24 Hours? No  How Long Ago Did You Use Drugs or Alcohol? No data recorded What Did You Use and How Much? No data recorded  Do You Currently Have a Therapist/Psychiatrist? No  Name of  Therapist/Psychiatrist: No data recorded  Have You Been Recently Discharged From Any Office Practice or Programs? No  Explanation of Discharge From Practice/Program: No data recorded    CCA Screening Triage Referral Assessment Type of Contact: Tele-Assessment  Is this Initial or Reassessment? Initial Assessment  Date Telepsych consult ordered in [facility]:  01/20/20  Time Telepsych consult ordered in [facility]:  1908   Patient Reported Information Reviewed? Yes  Patient Left Without Being Seen? No data recorded Reason for Not Completing Assessment: No data recorded  Collateral Involvement: Pt declined to provide verbal consent for clinician to contact friends/family members for collateral information.   Does Patient Have a Stage manager Guardian? No data recorded Name and Contact of Legal Guardian: No data recorded If Minor and Not Living with Parent(s), Who has Custody? N/A  Is CPS involved or ever been involved? Never  Is APS involved or ever been involved? Never   Patient Determined To Be At Risk for Harm To Self or Others Based on Review of Patient Reported Information or Presenting Complaint? Yes, for Self-Harm  Method: No data recorded Availability of Means: No data recorded Intent: No data recorded Notification Required: No data recorded Additional Information for Danger to Others Potential: No data recorded Additional Comments for Danger to Others Potential: No data recorded Are There Guns or Other Weapons in Your Home? No data recorded Types of Guns/Weapons: No data recorded Are These Weapons Safely Secured?                            No data recorded Who Could Verify You Are Able To Have These Secured: No data recorded Do You Have any Outstanding Charges, Pending Court Dates, Parole/Probation? No data recorded Contacted To Inform of Risk of Harm To Self or Others: Event organiser;Family/Significant Other: Risk manager and pt's fiance are aware of concern re:  pt's harm to self)   Location of Assessment: [facility]   Does Patient Present under Involuntary Commitment? Yes  IVC Papers Initial File Date: 01/20/20   [place] of Residence: [place]   Patient Currently Receiving the Following Services: Not Receiving Services   Determination of Need: Emergent (2 hours)   Options For Referral: Inpatient Hospitalization     CCA Biopsychosocial  Intake/Chief Complaint:  CCA Intake With Chief Complaint CCA Part Two Date: 01/20/20 CCA Part Two Time: 1908 Chief Complaint/Presenting Problem: Pt states, "I ran out of the house. (My fiance) called the Sheriff's Department to come find me." Patient's Currently Reported Symptoms/Problems: Pt declines she's experiencing any problems/concerns at this time. Individual's Strengths: UTA Individual's Preferences: UTA Individual's Abilities: UTA Type of Services Patient Feels Are Needed: Pt does not believe she's in need of any services at this time. Initial Clinical Notes/Concerns: N/A  Mental Health Symptoms Depression:  Depression: None  Mania:  Mania: Change in energy/activity  Anxiety:   Anxiety: Restlessness  Psychosis:  Psychosis: Hallucinations, Duration of symptoms less than six months  Trauma:  Trauma: None  Obsessions:  Obsessions: Disrupts routine/functioning, Cause anxiety, Recurrent & persistent thoughts/impulses/images  Compulsions:  Compulsions: None  Inattention:  Inattention: None  Hyperactivity/Impulsivity:  Hyperactivity/Impulsivity: N/A  Oppositional/Defiant Behaviors:  Oppositional/Defiant Behaviors: None  Emotional Irregularity:  Emotional Irregularity: Potentially harmful impulsivity, Transient, stress-related paranoia/disassociation  Other Mood/Personality Symptoms:  Other Mood/Personality Symptoms: None noted   Mental Status Exam Appearance and self-care  Stature:  Stature: Average  Weight:  Weight: Average weight  Clothing:  Clothing:  (Pt is in scrubs)  Grooming:   Grooming: Normal  Cosmetic use:  Cosmetic Use: None  Posture/gait:  Posture/Gait: Slumped  Motor activity:  Motor Activity: Not Remarkable  Sensorium  Attention:  Attention: Confused  Concentration:  Concentration: Scattered  Orientation:  Orientation:  (UTA)  Recall/memory:  Recall/Memory:  (UTA)  Affect and Mood  Affect:  Affect: Blunted  Mood:  Mood: Anxious  Relating  Eye contact:  Eye Contact: Normal  Facial expression:  Facial Expression: Responsive  Attitude toward examiner:  Attitude Toward Examiner: Cooperative  Thought and Language  Speech flow: Speech Flow: Slurred  Thought content:  Thought Content: Appropriate to Mood and Circumstances  Preoccupation:  Preoccupations: None  Hallucinations:  Hallucinations:  (Pt denies, though she acknowledged AH to the SD earlier)  Organization:     Transport planner of Knowledge:  Fund of Knowledge:  Special educational needs teacher)  Intelligence:  Intelligence:  Special educational needs teacher)  Abstraction:  Abstraction:  (UTA)  Judgement:  Judgement: Impaired  Reality Testing:  Reality Testing:  (UTA)  Insight:  Insight: Lacking  Decision Making:  Decision Making:  Special educational needs teacher)  Social Functioning  Social Maturity:  Social Maturity:  Special educational needs teacher)  Social Judgement:  Social Judgement:  Special educational needs teacher)  Stress  Stressors:  Stressors: Relationship, Illness  Coping Ability:  Coping Ability: Deficient supports  Skill Deficits:  Skill Deficits: Communication, Environmental health practitioner, Interpersonal, Self-control  Supports:  Supports: Family     Religion: Religion/Spirituality Are You A Religious Person?:  (UTA) How Might This Affect Treatment?: UTA  Leisure/Recreation: Leisure / Recreation Do You Have Hobbies?:  (UTA)  Exercise/Diet: Exercise/Diet Do You Exercise?:  (UTA) Have You Gained or Lost A Significant Amount of Weight in the Past Six Months?:  (UTA) Do You Follow a Special Diet?:  (UTA) Do You Have Any Trouble Sleeping?:  (UTA)   CCA Employment/Education  Employment/Work  Situation: Employment / Work Situation Employment situation: Unemployed Patient's job has been impacted by current illness:  (N/A) What is the longest time patient has a held a job?: UTA Where was the patient employed at that time?: UTA Has patient ever been in the [employer]?:  Special educational needs teacher)  Education: Education Is Patient Currently Attending School?: No Last Grade Completed:  Special educational needs teacher) Name of [place]: UTA Did Teacher, adult education From [employer]?:  (UTA) Did You Attend College?:  (UTA) Did You Attend Graduate School?:  (UTA) Did You Have Any Special Interests In School?: UTA Did You Have An Individualized Education Program (IIEP):  (UTA) Did You Have Any Difficulty At School?:  (UTA) Patient's Education Has Been Impacted by Current Illness:  (UTA)   CCA Family/Childhood History  Family and Relationship History: Family history Marital status: Long term relationship Long term relationship, how long?: UTA What types of issues is patient dealing with in the relationship?: UTA Additional relationship information: Pt lives with her fiance Are you sexually active?:  (UTA) What is your sexual orientation?: UTA Has your sexual activity been affected by drugs, alcohol, medication, or emotional stress?: UTA Does patient have children?:  (UTA)  Childhood History:  Childhood History By whom was/is the patient raised?:  (UTA) Additional childhood history information: UTA Description of patient's relationship with caregiver when they were  a child: UTA Patient's description of current relationship with people who raised him/her: UTA How were you disciplined when you got in trouble as a child/adolescent?: UTA Does patient have siblings?:  (UTA) Did patient suffer any verbal/emotional/physical/sexual abuse as a child?:  (UTA) Did patient suffer from severe childhood neglect?:  (UTA) Has patient ever been sexually abused/assaulted/raped as an adolescent or adult?:  (UTA) Was the patient ever a victim  of a crime or a disaster?:  (UTA) Witnessed domestic violence?:  (UTA) Has patient been affected by domestic violence as an adult?:  Special educational needs teacher)  Child/Adolescent Assessment:     CCA Substance Use  Alcohol/Drug Use: Alcohol / Drug Use Pain Medications: Please see MAR Prescriptions: Please see MAR Over the Counter: Please see MAR History of alcohol / drug use?: No history of alcohol / drug abuse Longest period of sobriety (when/how long): Pt denies SA                         ASAM's:  Six Dimensions of Multidimensional Assessment  Dimension 1:  Acute Intoxication and/or Withdrawal Potential:      Dimension 2:  Biomedical Conditions and Complications:      Dimension 3:  Emotional, Behavioral, or Cognitive Conditions and Complications:     Dimension 4:  Readiness to Change:     Dimension 5:  Relapse, Continued use, or Continued Problem Potential:     Dimension 6:  Recovery/Living Environment:     ASAM Severity Score:    ASAM Recommended Level of Treatment:     Substance use Disorder (SUD)    Recommendations for Services/Supports/Treatments: Lindon Romp, NP, reviewed pt's chart and information and determined pt meets inpatient criteria. This information was provided to the [place], Koren Shiver RN, at [phone]. Pt's providers were given this information at 0257.  DSM5 Diagnoses: Patient Active Problem List   Diagnosis Date Noted  . Nausea with vomiting 01/06/2020  . Schizoaffective disorder, bipolar type ([place]) 07/15/2019  . Nephrolithiasis 03/27/2019  . Schizoaffective disorder ([place]) 10/04/2018  . Special screening for malignant neoplasms, colon 04/05/2018  . Esophageal dysphagia 04/05/2018  . Gastroesophageal reflux disease 04/05/2018  . Perianal dermatitis r/o HSV 01/25/2018  . Rectocele 11/24/2015  . 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 Constipation due to opioid therapy 08/11/2015  . Annual physical exam 07/14/2015  . Constipation 04/21/2014  . GERD  (gastroesophageal reflux disease) 04/21/2014  . Vaginal discharge 01/16/2014  . Yeast infection 01/16/2014  . Vaginal dryness 01/16/2014  . Autonomic dysfunction 12/03/2013  . Paranoid schizophrenia ([place]) 09/18/2012  . Bipolar disorder, unspecified ([place]) 09/18/2012  . Generalized anxiety disorder 09/18/2012  . Obesity (BMI 30.0-34.9) 09/18/2012  . Low back pain 04/02/2012  . Lumbar spondylosis 04/02/2012  . Dysphagia 08/23/2011  . Palpitations 09/30/2010  . Elevated blood pressure reading without diagnosis of hypertension 09/30/2010  . Tobacco abuse 09/30/2010    Patient Centered Plan: Patient is on the following Treatment Plan(s):  Impulse Control   Referrals to Alternative Service(s): Referred to Alternative Service(s):   Place:   Date:   Time:    Referred to Alternative Service(s):   Place:   Date:   Time:    Referred to Alternative Service(s):   Place:   Date:   Time:    Referred to Alternative Service(s):   Place:   Date:   Time:     Dannielle Burn

## 2020-01-22 ENCOUNTER — Inpatient Hospital Stay (HOSPITAL_COMMUNITY)
Admission: AD | Admit: 2020-01-22 | Discharge: 2020-02-01 | DRG: 885 | Disposition: A | Discharge status: Home / Self Care | Payer: Medicaid Other | Source: Intra-hospital | Attending: Psychiatry | Admitting: Psychiatry

## 2020-01-22 ENCOUNTER — Other Ambulatory Visit: Payer: Self-pay

## 2020-01-22 ENCOUNTER — Encounter (HOSPITAL_COMMUNITY): Payer: Self-pay | Admitting: Psychiatry

## 2020-01-22 DIAGNOSIS — Z8261 Family history of arthritis: Secondary | ICD-10-CM | POA: Diagnosis not present

## 2020-01-22 DIAGNOSIS — F319 Bipolar disorder, unspecified: Secondary | ICD-10-CM | POA: Diagnosis not present

## 2020-01-22 DIAGNOSIS — Z823 Family history of stroke: Secondary | ICD-10-CM

## 2020-01-22 DIAGNOSIS — K219 Gastro-esophageal reflux disease without esophagitis: Secondary | ICD-10-CM | POA: Diagnosis present

## 2020-01-22 DIAGNOSIS — R1310 Dysphagia, unspecified: Secondary | ICD-10-CM | POA: Diagnosis present

## 2020-01-22 DIAGNOSIS — Z8249 Family history of ischemic heart disease and other diseases of the circulatory system: Secondary | ICD-10-CM

## 2020-01-22 DIAGNOSIS — F17210 Nicotine dependence, cigarettes, uncomplicated: Secondary | ICD-10-CM | POA: Diagnosis present

## 2020-01-22 DIAGNOSIS — M1990 Unspecified osteoarthritis, unspecified site: Secondary | ICD-10-CM | POA: Diagnosis present

## 2020-01-22 DIAGNOSIS — K5903 Drug induced constipation: Secondary | ICD-10-CM | POA: Diagnosis present

## 2020-01-22 DIAGNOSIS — Z720 Tobacco use: Secondary | ICD-10-CM | POA: Diagnosis not present

## 2020-01-22 DIAGNOSIS — M545 Low back pain, unspecified: Secondary | ICD-10-CM | POA: Diagnosis present

## 2020-01-22 DIAGNOSIS — M797 Fibromyalgia: Secondary | ICD-10-CM | POA: Diagnosis present

## 2020-01-22 DIAGNOSIS — F250 Schizoaffective disorder, bipolar type: Secondary | ICD-10-CM | POA: Diagnosis not present

## 2020-01-22 DIAGNOSIS — J449 Chronic obstructive pulmonary disease, unspecified: Secondary | ICD-10-CM | POA: Diagnosis present

## 2020-01-22 DIAGNOSIS — F411 Generalized anxiety disorder: Secondary | ICD-10-CM | POA: Diagnosis present

## 2020-01-22 DIAGNOSIS — F17200 Nicotine dependence, unspecified, uncomplicated: Secondary | ICD-10-CM | POA: Diagnosis present

## 2020-01-22 DIAGNOSIS — Z825 Family history of asthma and other chronic lower respiratory diseases: Secondary | ICD-10-CM | POA: Diagnosis not present

## 2020-01-22 DIAGNOSIS — R519 Headache, unspecified: Secondary | ICD-10-CM | POA: Diagnosis present

## 2020-01-22 DIAGNOSIS — Z20822 Contact with and (suspected) exposure to covid-19: Secondary | ICD-10-CM | POA: Diagnosis present

## 2020-01-22 DIAGNOSIS — R030 Elevated blood-pressure reading, without diagnosis of hypertension: Secondary | ICD-10-CM | POA: Diagnosis present

## 2020-01-22 DIAGNOSIS — L089 Local infection of the skin and subcutaneous tissue, unspecified: Secondary | ICD-10-CM | POA: Diagnosis present

## 2020-01-22 DIAGNOSIS — Z818 Family history of other mental and behavioral disorders: Secondary | ICD-10-CM | POA: Diagnosis not present

## 2020-01-22 DIAGNOSIS — F1120 Opioid dependence, uncomplicated: Secondary | ICD-10-CM | POA: Diagnosis present

## 2020-01-22 DIAGNOSIS — E669 Obesity, unspecified: Secondary | ICD-10-CM | POA: Diagnosis present

## 2020-01-22 DIAGNOSIS — R42 Dizziness and giddiness: Secondary | ICD-10-CM

## 2020-01-22 DIAGNOSIS — Z79899 Other long term (current) drug therapy: Secondary | ICD-10-CM

## 2020-01-22 DIAGNOSIS — Z23 Encounter for immunization: Secondary | ICD-10-CM | POA: Diagnosis not present

## 2020-01-22 DIAGNOSIS — Z9114 Patient's other noncompliance with medication regimen: Secondary | ICD-10-CM

## 2020-01-22 DIAGNOSIS — Z79890 Hormone replacement therapy (postmenopausal): Secondary | ICD-10-CM

## 2020-01-22 DIAGNOSIS — T402X5A Adverse effect of other opioids, initial encounter: Secondary | ICD-10-CM | POA: Diagnosis present

## 2020-01-22 DIAGNOSIS — E039 Hypothyroidism, unspecified: Secondary | ICD-10-CM | POA: Diagnosis present

## 2020-01-22 DIAGNOSIS — F312 Bipolar disorder, current episode manic severe with psychotic features: Secondary | ICD-10-CM | POA: Diagnosis not present

## 2020-01-22 DIAGNOSIS — G8929 Other chronic pain: Secondary | ICD-10-CM | POA: Diagnosis present

## 2020-01-22 DIAGNOSIS — F1911 Other psychoactive substance abuse, in remission: Secondary | ICD-10-CM | POA: Diagnosis present

## 2020-01-22 DIAGNOSIS — Z85828 Personal history of other malignant neoplasm of skin: Secondary | ICD-10-CM

## 2020-01-22 DIAGNOSIS — K2100 Gastro-esophageal reflux disease with esophagitis, without bleeding: Secondary | ICD-10-CM | POA: Diagnosis not present

## 2020-01-22 DIAGNOSIS — M47816 Spondylosis without myelopathy or radiculopathy, lumbar region: Secondary | ICD-10-CM | POA: Diagnosis present

## 2020-01-22 DIAGNOSIS — R112 Nausea with vomiting, unspecified: Secondary | ICD-10-CM | POA: Diagnosis present

## 2020-01-22 DIAGNOSIS — R002 Palpitations: Secondary | ICD-10-CM | POA: Diagnosis present

## 2020-01-22 DIAGNOSIS — L0291 Cutaneous abscess, unspecified: Secondary | ICD-10-CM

## 2020-01-22 LAB — RESPIRATORY PANEL BY RT PCR (FLU A&B, COVID)
Influenza A by PCR: NEGATIVE
Influenza B by PCR: NEGATIVE
SARS Coronavirus 2 by RT PCR: NEGATIVE

## 2020-01-22 MED ORDER — BUPRENORPHINE HCL-NALOXONE HCL 2-0.5 MG SL SUBL
2.0000 | SUBLINGUAL_TABLET | Freq: Two times a day (BID) | SUBLINGUAL | Status: DC
  Administered 2020-01-22 – 2020-02-01 (×20): 2 via SUBLINGUAL
  Filled 2020-01-22 (×5): qty 2
  Filled 2020-01-22: qty 1
  Filled 2020-01-22 (×4): qty 2
  Filled 2020-01-22: qty 1
  Filled 2020-01-22 (×5): qty 2
  Filled 2020-01-22: qty 1
  Filled 2020-01-22 (×2): qty 2
  Filled 2020-01-22: qty 1
  Filled 2020-01-22 (×2): qty 2

## 2020-01-22 MED ORDER — ALBUTEROL SULFATE HFA 108 (90 BASE) MCG/ACT IN AERS: 2.0000 | INHALATION_SPRAY | Freq: Four times a day (QID) | RESPIRATORY_TRACT | Status: DC | PRN

## 2020-01-22 MED ORDER — PNEUMOCOCCAL VAC POLYVALENT 25 MCG/0.5ML IJ INJ
0.5000 mL | INJECTION | INTRAMUSCULAR | Status: AC
  Administered 2020-01-23: 0.5 mL via INTRAMUSCULAR
  Filled 2020-01-22: qty 0.5

## 2020-01-22 MED ORDER — FUROSEMIDE 20 MG PO TABS: 20.0000 mg | ORAL_TABLET | ORAL | Status: DC | PRN

## 2020-01-22 MED ORDER — INFLUENZA VAC SPLIT QUAD 0.5 ML IM SUSY
0.5000 mL | PREFILLED_SYRINGE | INTRAMUSCULAR | Status: AC
  Administered 2020-01-23: 0.5 mL via INTRAMUSCULAR
  Filled 2020-01-22: qty 0.5

## 2020-01-22 MED ORDER — LEVOTHYROXINE SODIUM 25 MCG PO TABS
25.0000 ug | ORAL_TABLET | Freq: Every day | ORAL | Status: DC
  Administered 2020-01-22 – 2020-02-01 (×11): 25 ug via ORAL
  Filled 2020-01-22 (×15): qty 1

## 2020-01-22 MED ORDER — MONTELUKAST SODIUM 10 MG PO TABS
10.0000 mg | ORAL_TABLET | Freq: Every day | ORAL | Status: DC
  Administered 2020-01-22 – 2020-01-31 (×10): 10 mg via ORAL
  Filled 2020-01-22 (×13): qty 1

## 2020-01-22 MED ORDER — SULFAMETHOXAZOLE-TRIMETHOPRIM 800-160 MG PO TABS
1.0000 | ORAL_TABLET | Freq: Two times a day (BID) | ORAL | Status: DC
  Administered 2020-01-22 – 2020-01-29 (×14): 1 via ORAL
  Filled 2020-01-22 (×20): qty 1

## 2020-01-22 MED ORDER — MAGNESIUM HYDROXIDE 400 MG/5ML PO SUSP: 30.0000 mL | Freq: Every day | ORAL | Status: DC | PRN

## 2020-01-22 MED ORDER — TRAZODONE HCL 50 MG PO TABS
50.0000 mg | ORAL_TABLET | Freq: Every evening | ORAL | Status: DC | PRN
  Administered 2020-01-22 – 2020-01-30 (×9): 50 mg via ORAL
  Filled 2020-01-22 (×9): qty 1

## 2020-01-22 MED ORDER — TAMSULOSIN HCL 0.4 MG PO CAPS
0.4000 mg | ORAL_CAPSULE | Freq: Every day | ORAL | Status: DC
  Administered 2020-01-22 – 2020-02-01 (×11): 0.4 mg via ORAL
  Filled 2020-01-22 (×14): qty 1

## 2020-01-22 MED ORDER — BUPRENORPHINE HCL 8 MG SL SUBL
4.0000 mg | SUBLINGUAL_TABLET | Freq: Two times a day (BID) | SUBLINGUAL | Status: DC
Start: 2020-01-22 — End: 2020-01-22

## 2020-01-22 MED ORDER — WHITE PETROLATUM EX OINT
TOPICAL_OINTMENT | CUTANEOUS | Status: AC
  Filled 2020-01-22: qty 5

## 2020-01-22 MED ORDER — CEPHALEXIN 500 MG PO CAPS
500.0000 mg | ORAL_CAPSULE | Freq: Four times a day (QID) | ORAL | Status: DC
  Administered 2020-01-22 – 2020-01-25 (×12): 500 mg via ORAL
  Filled 2020-01-22 (×4): qty 1
  Filled 2020-01-22: qty 2
  Filled 2020-01-22 (×15): qty 1
  Filled 2020-01-22: qty 2

## 2020-01-22 MED ORDER — HYDROXYZINE HCL 25 MG PO TABS
25.0000 mg | ORAL_TABLET | Freq: Three times a day (TID) | ORAL | Status: DC | PRN
  Administered 2020-01-22 – 2020-01-28 (×7): 25 mg via ORAL
  Filled 2020-01-22 (×7): qty 1

## 2020-01-22 MED ORDER — PANTOPRAZOLE SODIUM 40 MG PO TBEC
80.0000 mg | DELAYED_RELEASE_TABLET | Freq: Every day | ORAL | Status: DC
  Administered 2020-01-22 – 2020-02-01 (×11): 80 mg via ORAL
  Filled 2020-01-22 (×13): qty 2

## 2020-01-22 MED ORDER — OLANZAPINE 5 MG PO TABS
5.0000 mg | ORAL_TABLET | Freq: Two times a day (BID) | ORAL | Status: DC
  Administered 2020-01-22 – 2020-01-23 (×2): 5 mg via ORAL
  Filled 2020-01-22: qty 2
  Filled 2020-01-22 (×3): qty 1
  Filled 2020-01-22: qty 2
  Filled 2020-01-22: qty 1

## 2020-01-22 MED ORDER — ACETAMINOPHEN 325 MG PO TABS
650.0000 mg | ORAL_TABLET | Freq: Four times a day (QID) | ORAL | Status: DC | PRN
  Administered 2020-01-22 – 2020-01-31 (×6): 650 mg via ORAL
  Filled 2020-01-22 (×5): qty 2

## 2020-01-22 MED ORDER — FLUTICASONE PROPIONATE 50 MCG/ACT NA SUSP
1.0000 | Freq: Two times a day (BID) | NASAL | Status: DC
  Administered 2020-01-22 – 2020-02-01 (×18): 1 via NASAL
  Filled 2020-01-22 (×2): qty 16

## 2020-01-22 MED ORDER — ALUM & MAG HYDROXIDE-SIMETH 200-200-20 MG/5ML PO SUSP: 30.0000 mL | ORAL | Status: DC | PRN

## 2020-01-22 MED ORDER — PREGABALIN 50 MG PO CAPS
50.0000 mg | ORAL_CAPSULE | Freq: Three times a day (TID) | ORAL | Status: DC
  Administered 2020-01-22 – 2020-02-01 (×30): 50 mg via ORAL
  Filled 2020-01-22 (×30): qty 1

## 2020-01-22 NOTE — Tx Team (Signed)
Initial Treatment Plan 01/22/2020 1:59 PM Adeli YALANDA SODERMAN NXG:[id number]    PATIENT STRESSORS: Health problems Marital or family conflict Medication change or noncompliance   PATIENT STRENGTHS: Communication skills Supportive family/friends   PATIENT IDENTIFIED PROBLEMS: Paranoia  Hallucinations  Medication noncompliance                 DISCHARGE CRITERIA:  Adequate post-discharge living arrangements Motivation to continue treatment in a less acute level of care  PRELIMINARY DISCHARGE PLAN: Attend aftercare/continuing care group Outpatient therapy Return to previous living arrangement  PATIENT/FAMILY INVOLVEMENT: This treatment plan has been presented to and reviewed with the patient, KILANI JOFFE, and/or family member.  The patient and family have been given the opportunity to ask questions and make suggestions.  Vela Prose, RN 01/22/2020, 1:59 PM

## 2020-01-22 NOTE — BHH Suicide Risk Assessment (Signed)
[facility] Admission Suicide Risk Assessment   Nursing information obtained from:  Patient Demographic factors:  Unemployed Current Mental Status:  NA Loss Factors:  NA Historical Factors:  NA Risk Reduction Factors:  Living with another person, especially a relative  Total Time spent with patient: 45 minutes Principal Problem: <principal problem not specified> Diagnosis:  Active Problems:   Bipolar 1 disorder ([place])  Data: 54 year old woman with bipolar disorder presenting in a manic state.  Continued Clinical Symptoms:  Alcohol Use Disorder Identification Test Final Score (AUDIT): 1 The "Alcohol Use Disorders Identification Test", Guidelines for Use in Primary Care, Second Edition.  World Pharmacologist [facility]). Score between 0-7:  no or low risk or alcohol related problems. Score between 8-15:  moderate risk of alcohol related problems. Score between 16-19:  high risk of alcohol related problems. Score 20 or above:  warrants further diagnostic evaluation for alcohol dependence and treatment.   CLINICAL FACTORS:   Severe Anxiety and/or Agitation Bipolar Disorder:   Mixed State Chronic Pain Currently Psychotic       COGNITIVE FEATURES THAT CONTRIBUTE TO RISK:  Closed-mindedness and Thought constriction (tunnel vision)    SUICIDE RISK:   Mild:  Suicidal ideation of limited frequency, intensity, duration, and specificity.  There are no identifiable plans, no associated intent, mild dysphoria and related symptoms, good self-control (both objective and subjective assessment), few other risk factors, and identifiable protective factors, including available and accessible social support.  PLAN OF CARE: We will plan to treat on an inpatient basis  I certify that inpatient services furnished can reasonably be expected to improve the patient's condition.   Dixie Dials, MD 01/22/2020, 3:22 PM

## 2020-01-22 NOTE — Progress Notes (Signed)
Admission Note: Patient is a 54 year old female admitted to the unit from [facility] for auditory hallucination and medication noncompliance.  Patient IVC by her fiance  Per report, patient ran into the wood and was hiding in a pond after having an argument with fiance.  Patient denies SI and hallucination.  States she doesn't know why she is here.  Patient presents with a flat affect and depressed mood.  Admission plan of care reviewed and consent signed.  Personal belongings and skin assessment completed.  Scratched mark noted on left arm, abdomen, and right arm is swollen with dark spot/abrasion.  Patient oriented to the unit,staff and room.  Routine safety checks initiated.  Verbalizes understanding of unit rules/protocols.  Md made aware of skin abrasion/discoloration.  Antibiotic therapy initiated.  Patient is safe on the unit.

## 2020-01-22 NOTE — ED Notes (Signed)
Attempted to call report again without an answer.

## 2020-01-22 NOTE — ED Notes (Signed)
Called [facility] to give report, staff state to call back after 0800.

## 2020-01-22 NOTE — ED Notes (Signed)
Attempted to call report without success

## 2020-01-22 NOTE — ED Notes (Signed)
Called to give report, staff state they are in a meeting and will call back when available.

## 2020-01-22 NOTE — Progress Notes (Signed)
Pt is accepted to [facility] room 505-1 after 0830 this morning.  Accepting provider is Lindon Romp NP. Please call report to [phone] prior to discharging the patient.

## 2020-01-22 NOTE — BHH Group Notes (Signed)
[facility] LCSW Group Therapy  01/22/2020 2:51 PM  Type of Therapy:  Coping Skills  Participation Level:  Did Not Attend   Summary of Progress/Problems: This patient was invited to attend group, however this patient chose not to attend.    Gearline Spilman A Fran Mcree 01/22/2020, 2:51 PM
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
## 2020-01-22 NOTE — ED Notes (Signed)
Attempted to reach contact to inform of transfer, unable to leave voicemail due to mailbox being full. Called for transport twice with no answer, unable to leave message.

## 2020-01-23 LAB — LIPID PANEL
Cholesterol: 164 mg/dL (ref 0–200)
HDL: 43 mg/dL (ref >40)
LDL Cholesterol: 101 mg/dL — ABNORMAL HIGH (ref 0–99)
Total CHOL/HDL Ratio: 3.8 RATIO
Triglycerides: 99 mg/dL (ref <150)
VLDL: 20 mg/dL (ref 0–40)

## 2020-01-23 LAB — COMPREHENSIVE METABOLIC PANEL
ALT: 11 U/L (ref 0–44)
AST: 14 U/L — ABNORMAL LOW (ref 15–41)
Albumin: 3.8 g/dL (ref 3.5–5.0)
Alkaline Phosphatase: 94 U/L (ref 38–126)
Anion gap: 10 (ref 5–15)
BUN: 16 mg/dL (ref 6–20)
CO2: 23 mmol/L (ref 22–32)
Calcium: 8.6 mg/dL — ABNORMAL LOW (ref 8.9–10.3)
Chloride: 104 mmol/L (ref 98–111)
Creatinine, Ser: 0.68 mg/dL (ref 0.44–1.00)
GFR, Estimated: >60 mL/min (ref >60)
Glucose, Bld: 103 mg/dL — ABNORMAL HIGH (ref 70–99)
Potassium: 3.3 mmol/L — ABNORMAL LOW (ref 3.5–5.1)
Sodium: 137 mmol/L (ref 135–145)
Total Bilirubin: 0.8 mg/dL (ref 0.3–1.2)
Total Protein: 6.8 g/dL (ref 6.5–8.1)

## 2020-01-23 LAB — CBC
HCT: 46.5 % — ABNORMAL HIGH (ref 36.0–46.0)
Hemoglobin: 15.1 g/dL — ABNORMAL HIGH (ref 12.0–15.0)
MCH: 30.8 pg (ref 26.0–34.0)
MCHC: 32.5 g/dL (ref 30.0–36.0)
MCV: 94.9 fL (ref 80.0–100.0)
Platelets: 265 10*3/uL (ref 150–400)
RBC: 4.9 MIL/uL (ref 3.87–5.11)
RDW: 14.9 % (ref 11.5–15.5)
WBC: 8.3 10*3/uL (ref 4.0–10.5)
nRBC: 0 % (ref 0.0–0.2)

## 2020-01-23 MED ORDER — OLANZAPINE 7.5 MG PO TABS
7.5000 mg | ORAL_TABLET | Freq: Two times a day (BID) | ORAL | Status: DC
  Administered 2020-01-23 – 2020-01-26 (×6): 7.5 mg via ORAL
  Filled 2020-01-23 (×8): qty 1
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
## 2020-01-23 NOTE — Progress Notes (Signed)
The patient rated her day as a 7 out of a possible 10. She verbalized that her day was "positive" but that she had a great deal on her mind. Her goal for tomorrow is to inquire about getting discharged.

## 2020-01-23 NOTE — BHH Group Notes (Signed)
Did not attend any groups today.

## 2020-01-23 NOTE — BHH Suicide Risk Assessment (Signed)
[facility] INPATIENT:  Family/Significant Other Suicide Prevention Education   Refusal of Consents:  Suicide Prevention Education:  Patient Refusal for Family/Significant Other Suicide Prevention Education: The patient Meredith [name] has refused to provide written consent for family/significant other to be provided Family/Significant Other Suicide Prevention Education during admission and/or prior to discharge.  Physician notified.   SPE completed with patient, as patient refused to consent to family contact. SPI pamphlet provided to pt and pt was encouraged to share information with support network, ask questions, and talk about any concerns relating to SPE. Patient denies access to guns/firearms and verbalized understanding of information provided. Mobile Crisis information also provided to patient.   Darletta Moll MSW, LCSW Clincal Social Worker  [facility]

## 2020-01-23 NOTE — Progress Notes (Signed)
   01/23/20 1000  Psych Admission Type (Psych Patients Only)  Admission Status Involuntary  Psychosocial Assessment  Patient Complaints Anxiety  Eye Contact Brief  Facial Expression Anxious  Affect Anxious  Speech Logical/coherent  Interaction Assertive  Motor Activity Other (Comment) (WDL)  Appearance/Hygiene Unremarkable  Thought Process  Coherency WDL  Content WDL  Delusions None reported or observed  Perception WDL  Hallucination None reported or observed  Judgment Impaired  Confusion None  Danger to Self  Current suicidal ideation? Denies  Danger to Others  Danger to Others None reported or observed
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
## 2020-01-23 NOTE — Progress Notes (Signed)
Pt states that she just can't sleep. States that she slept well last night but not tonight. When asked, pt is worried about what she will do when she leaves. Pt not sure that she will return to live with her fiance. According to pt, she doesn't have anywhere else to go. Hasn't mentioned this to provider yet. Pt encouraged to mention this to her treatment team to get options and resources. Will leave a sticky note for treatment team. Pt given an additional dose of Vistaril 25 mg (okayed by provider since close to last dose given). Pt also given Tylenol 625 mg for pain in her right arm rated 3/10 in an effort to reduce issues that are keeping her from sleeping.

## 2020-01-23 NOTE — Progress Notes (Signed)
D: Patient presents with anxious affect but is pleasant and cooperative. Patient denies SI/HI at this time. Patient also denies AH/VH at this time. Patient aware to notify staff or RN if this changes.  A: Provided positive reinforcement and encouragement.  R: Patient cooperative and receptive to efforts.

## 2020-01-24 DIAGNOSIS — F319 Bipolar disorder, unspecified: Secondary | ICD-10-CM

## 2020-01-24 NOTE — Progress Notes (Addendum)
Pt paranoid , pt thinks "you are going to kill me" , pt stated AH- better    01/24/20 2100  Psych Admission Type (Psych Patients Only)  Admission Status Involuntary  Psychosocial Assessment  Patient Complaints Anxiety  Eye Contact Brief  Facial Expression Anxious;Worried  Affect Anxious  Speech Logical/coherent  Interaction Assertive;Guarded  Motor Activity Other (Comment) (WDL)  Appearance/Hygiene Unremarkable  Behavior Characteristics Cooperative  Mood Depressed  Thought Process  Coherency WDL  Content WDL  Delusions None reported or observed  Perception WDL  Hallucination None reported or observed  Judgment Poor  Confusion None  Danger to Self  Current suicidal ideation? Denies  Danger to Others  Danger to Others None reported or observed
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
## 2020-01-24 NOTE — Progress Notes (Signed)
   01/24/20 1100  Psych Admission Type (Psych Patients Only)  Admission Status Involuntary  Psychosocial Assessment  Patient Complaints Anxiety  Eye Contact Brief  Facial Expression Anxious;Worried  Affect Anxious  Speech Logical/coherent  Interaction Assertive;Guarded  Motor Activity Other (Comment) (WDL)  Appearance/Hygiene Unremarkable  Behavior Characteristics Cooperative  Mood Depressed  Thought Process  Coherency WDL  Content WDL  Delusions None reported or observed  Perception WDL  Hallucination None reported or observed  Judgment Poor  Confusion None  Danger to Self  Current suicidal ideation? Denies  Danger to Others  Danger to Others None reported or observed

## 2020-01-24 NOTE — [facility] Group Notes (Signed)
[facility] LCSW Group Therapy  01/24/2020 1:30 PM  Type of Therapy: Self-Care  Participation Level:  Did Not Attend   Summary of Progress/Problems: This patient was invited to attend group, however this patient chose not to attend.    Freddi Che, LCSW 01/24/2020,1:52 PM

## 2020-01-24 NOTE — Progress Notes (Signed)
   01/23/20 2031  Psych Admission Type (Psych Patients Only)  Admission Status Involuntary  Psychosocial Assessment  Patient Complaints Anxiety  Eye Contact Brief  Facial Expression Anxious;Worried  Affect Anxious  Speech Logical/coherent  Interaction Assertive;Guarded  Motor Activity Other (Comment) (WDL)  Appearance/Hygiene Unremarkable  Thought Process  Coherency WDL  Content WDL  Delusions None reported or observed  Perception WDL  Hallucination None reported or observed  Judgment UTA  Confusion None  Danger to Self  Current suicidal ideation? Denies  Danger to Others  Danger to Others None reported or observed  D: Patient presents with guarded and anxious affect. Patient is cooperative with medications. Patient denies SI/HI at this time. Patient also denies AH/VH at this time. Patient aware to notify staff or RN if this changes.  A: Provided positive reinforcement and encouragement.  R: Patient cooperative and receptive to efforts.

## 2020-01-24 NOTE — Tx Team (Signed)
Interdisciplinary Treatment and Diagnostic Plan Update  01/24/2020 Time of Session: 10:00am Meredith [name] MRN: [id number]  Principal Diagnosis: <principal problem not specified>  Secondary Diagnoses: Active Problems:   Bipolar 1 disorder (HCC)   Current Medications:  Current Facility-Administered Medications  Medication Dose Route Frequency Provider Last Rate Last Admin  . acetaminophen (TYLENOL) tablet 650 mg  650 mg Oral Q6H PRN Dixie Dials, MD   650 mg at 01/23/20 2330  . albuterol (VENTOLIN HFA) 108 (90 Base) MCG/ACT inhaler 2 puff  2 puff Inhalation Q6H PRN Cristofano, Dorene Ar, MD      . alum & mag hydroxide-simeth (MAALOX/MYLANTA) 200-200-20 MG/5ML suspension 30 mL  30 mL Oral Q4H PRN Cristofano, Paul A, MD      . buprenorphine-naloxone (SUBOXONE) 2-0.5 mg per SL tablet 2 tablet  2 tablet Sublingual BID Cristofano, Dorene Ar, MD   2 tablet at 01/24/20 0817  . cephALEXin (KEFLEX) capsule 500 mg  500 mg Oral QID Cristofano, Dorene Ar, MD   500 mg at 01/24/20 0817  . fluticasone (FLONASE) 50 MCG/ACT nasal spray 1 spray  1 spray Each Nare BID Cristofano, Dorene Ar, MD   1 spray at 01/24/20 0817  . furosemide (LASIX) tablet 20 mg  20 mg Oral PRN Cristofano, Dorene Ar, MD      . hydrOXYzine (ATARAX/VISTARIL) tablet 25 mg  25 mg Oral TID PRN Rozetta Nunnery, NP   25 mg at 01/23/20 2330  . levothyroxine (SYNTHROID) tablet 25 mcg  25 mcg Oral Daily Cristofano, Dorene Ar, MD   25 mcg at 01/24/20 0817  . magnesium hydroxide (MILK OF MAGNESIA) suspension 30 mL  30 mL Oral Daily PRN Cristofano, Paul A, MD      . montelukast (SINGULAIR) tablet 10 mg  10 mg Oral QHS Cristofano, Dorene Ar, MD   10 mg at 01/23/20 2031  . OLANZapine (ZYPREXA) tablet 7.5 mg  7.5 mg Oral BID Cristofano, Dorene Ar, MD   7.5 mg at 01/24/20 0817  . pantoprazole (PROTONIX) EC tablet 80 mg  80 mg Oral Q1200 Cristofano, Dorene Ar, MD   80 mg at 01/23/20 1148  . pregabalin (LYRICA) capsule 50 mg  50 mg Oral TID Dixie Dials, MD   50 mg  at 01/24/20 0817  . sulfamethoxazole-trimethoprim (BACTRIM DS) 800-160 MG per tablet 1 tablet  1 tablet Oral BID Cristofano, Dorene Ar, MD   1 tablet at 01/24/20 0817  . tamsulosin (FLOMAX) capsule 0.4 mg  0.4 mg Oral Daily Cristofano, Dorene Ar, MD   0.4 mg at 01/24/20 0817  . traZODone (DESYREL) tablet 50 mg  50 mg Oral QHS PRN Rozetta Nunnery, NP   50 mg at 01/23/20 2031   PTA Medications: Medications Prior to Admission  Medication Sig Dispense Refill Last Dose  . albuterol (PROVENTIL HFA;VENTOLIN HFA) 108 (90 Base) MCG/ACT inhaler Inhale 2 puffs into the lungs every 6 (six) hours as needed for wheezing or shortness of breath.     . Buprenorphine HCl-Naloxone HCl (SUBOXONE) 4-1 MG FILM Place 1 Film under the tongue in the morning and at bedtime.     Marland Kitchen EPINEPHrine 0.3 mg/0.3 mL IJ SOAJ injection Inject 0.3 mg into the muscle daily as needed (for anaphylatic allergic reactions).      . esomeprazole (NEXIUM) 40 MG capsule Take 1 capsule (40 mg total) by mouth daily at 12 noon. 30 capsule 6   . fluticasone (FLONASE) 50 MCG/ACT nasal spray Place 1 spray into both nostrils.     Marland Kitchen  furosemide (LASIX) 20 MG tablet Take 20 mg by mouth as needed.      . hydrOXYzine (ATARAX/VISTARIL) 50 MG tablet Take 50 mg by mouth 3 (three) times daily.     Marland Kitchen LATUDA 40 MG TABS tablet Take 40 mg by mouth daily.     Marland Kitchen levothyroxine (SYNTHROID, LEVOTHROID) 25 MCG tablet Take 25 mcg by mouth daily.      Marland Kitchen lidocaine (XYLOCAINE) 5 % ointment Apply topically.     . montelukast (SINGULAIR) 10 MG tablet Take 10 mg by mouth at bedtime.     . naloxegol oxalate (MOVANTIK) 12.5 MG TABS tablet Take 1 tablet (12.5 mg total) by mouth daily. 90 tablet 1   . ondansetron (ZOFRAN-ODT) 8 MG disintegrating tablet Take 1 tablet (8 mg total) by mouth every 12 (twelve) hours as needed for nausea or vomiting. 20 tablet 1   . potassium chloride (KLOR-CON) 10 MEQ tablet Take 10 mEq by mouth daily.     . pregabalin (LYRICA) 50 MG capsule Take 50 mg by  mouth 3 (three) times daily.     Marland Kitchen SPIRIVA HANDIHALER 18 MCG inhalation capsule INHALE 1 CAPSULE BY MOUTHTDAILY.M     . sulfamethoxazole-trimethoprim (BACTRIM) 400-80 MG tablet Take 1 tablet by mouth 2 (two) times daily.     . tamsulosin (FLOMAX) 0.4 MG CAPS capsule Take 0.4 mg by mouth as needed.     . traZODone (DESYREL) 50 MG tablet Take 50 mg by mouth at bedtime.       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   Physician Treatment Plan for Primary Diagnosis: <principal problem not specified> Long Term Goal(s): Improvement in symptoms so as ready for discharge   Short Term Goals: Ability to identify changes in lifestyle to reduce recurrence of condition will improve Ability to verbalize feelings will improve Ability to demonstrate self-control will improve Ability to identify and develop effective coping behaviors will improve Ability to maintain clinical measurements within normal limits will improve Compliance with prescribed medications will improve Ability to identify triggers associated with substance abuse/mental health issues will improve  Medication Management: Evaluate patient's response, side effects, and tolerance of medication regimen.  Therapeutic Interventions: 1 to 1 sessions, Unit Group sessions and Medication administration.  Evaluation of Outcomes: Not Met  Physician Treatment Plan for Secondary Diagnosis: Active Problems:   Bipolar 1 disorder ([place])  Long Term Goal(s): Improvement in symptoms so as ready for discharge   Short Term Goals: Ability to identify changes in lifestyle to reduce recurrence of condition will improve Ability to verbalize feelings will improve Ability to  demonstrate self-control will improve Ability to identify and develop effective coping behaviors will improve Ability to maintain clinical measurements within normal limits will improve Compliance with prescribed medications will improve Ability to identify triggers associated with substance abuse/mental health issues will improve     Medication Management: Evaluate patient's response, side effects, and tolerance of medication regimen.  Therapeutic Interventions: 1 to 1 sessions, Unit Group sessions and Medication administration.  Evaluation of Outcomes: Not Met   RN Treatment Plan for Primary Diagnosis: <principal problem not specified> Long Term Goal(s): Knowledge of disease and therapeutic regimen to maintain health will improve  Short Term Goals: Ability to remain free from injury will improve, Ability to verbalize frustration and anger appropriately will improve, Ability  to identify and develop effective coping behaviors will improve and Compliance with prescribed medications will improve  Medication Management: RN will administer medications as ordered by provider, will assess and evaluate patient's response and provide education to patient for prescribed medication. RN will report any adverse and/or side effects to prescribing provider.  Therapeutic Interventions: 1 on 1 counseling sessions, Psychoeducation, Medication administration, Evaluate responses to treatment, Monitor vital signs and CBGs as ordered, Perform/monitor CIWA, COWS, AIMS and Fall Risk screenings as ordered, Perform wound care treatments as ordered.  Evaluation of Outcomes: Not Met   LCSW Treatment Plan for Primary Diagnosis: <principal problem not specified> Long Term Goal(s): Safe transition to appropriate next level of care at discharge, Engage patient in therapeutic group addressing interpersonal concerns.  Short Term Goals: Engage patient in aftercare planning with referrals and resources, Increase social  support, Identify triggers associated with mental health/substance abuse issues and Increase skills for wellness and recovery  Therapeutic Interventions: Assess for all discharge needs, 1 to 1 time with Social worker, Explore available resources and support systems, Assess for adequacy in community support network, Educate family and significant other(s) on suicide prevention, Complete Psychosocial Assessment, Interpersonal group therapy.  Evaluation of Outcomes: Not Met   Progress in Treatment: Attending groups: No. Participating in groups: No. Taking medication as prescribed: Yes. Toleration medication: Yes. Family/Significant other contact made: No, will contact:  declined consents Patient understands diagnosis: Yes. Discussing patient identified problems/goals with staff: Yes. Medical problems stabilized or resolved: Yes. Denies suicidal/homicidal ideation: Yes. Issues/concerns per patient self-inventory: No.   New problem(s) identified: No, Describe:  none  New Short Term/Long Term Goal(s): medication stabilization, elimination of SI thoughts, development of comprehensive mental wellness plan.   Patient Goals:  "Just to go home"  Discharge Plan or Barriers: Patient recently admitted. CSW will continue to follow and assess for appropriate referrals and possible discharge planning.   Reason for Continuation of Hospitalization: Anxiety Depression Medication stabilization  Estimated Length of Stay: 1-3 days  Attendees: Patient: Meredith [name] 01/24/2020  Physician:  01/24/2020   Nursing:  01/24/2020   RN Care Manager: 01/24/2020   Social Worker: Darletta Moll, LCSW 01/24/2020   Recreational Therapist:  01/24/2020  Other: Harriett Sine, NP 01/24/2020   Other:  01/24/2020  Other: 01/24/2020       Scribe for Treatment Team: Vassie Moselle, LCSW 01/24/2020 10:55 AM

## 2020-01-25 DIAGNOSIS — F250 Schizoaffective disorder, bipolar type: Principal | ICD-10-CM

## 2020-01-25 MED ORDER — TRAZODONE HCL 50 MG PO TABS
50.0000 mg | ORAL_TABLET | Freq: Once | ORAL | Status: AC
  Administered 2020-01-25: 50 mg via ORAL
  Filled 2020-01-25 (×2): qty 1

## 2020-01-25 MED ORDER — WHITE PETROLATUM EX OINT
TOPICAL_OINTMENT | CUTANEOUS | Status: AC
  Filled 2020-01-25: qty 5

## 2020-01-25 MED ORDER — POLYETHYLENE GLYCOL 3350 17 G PO PACK
17.0000 g | PACK | Freq: Every day | ORAL | Status: DC | PRN
  Administered 2020-01-29 – 2020-01-30 (×2): 17 g via ORAL

## 2020-01-25 MED ORDER — BACID PO TABS
2.0000 | ORAL_TABLET | Freq: Three times a day (TID) | ORAL | Status: DC
  Filled 2020-01-25 (×6): qty 2

## 2020-01-25 MED ORDER — OLANZAPINE 7.5 MG PO TABS
15.0000 mg | ORAL_TABLET | Freq: Every day | ORAL | Status: DC
  Administered 2020-01-25: 15 mg via ORAL
  Filled 2020-01-25 (×2): qty 2

## 2020-01-25 MED ORDER — RISAQUAD PO CAPS
2.0000 | ORAL_CAPSULE | Freq: Three times a day (TID) | ORAL | Status: DC
  Administered 2020-01-25 – 2020-02-01 (×21): 2 via ORAL
  Filled 2020-01-25 (×27): qty 2

## 2020-01-25 NOTE — Progress Notes (Signed)
°   01/25/20 2035  COVID-19 Daily Checkoff  Have you had a fever (temp > 37.80C/100F)  in the past 24 hours?  No  If you have had runny nose, nasal congestion, sneezing in the past 24 hours, has it worsened? No  COVID-19 EXPOSURE  Have you traveled outside the state in the past 14 days? No  Have you been in contact with someone with a confirmed diagnosis of COVID-19 or PUI in the past 14 days without wearing appropriate PPE? No  Have you been living in the same home as a person with confirmed diagnosis of COVID-19 or a PUI (household contact)? No  Have you been diagnosed with COVID-19? No

## 2020-01-25 NOTE — Progress Notes (Signed)
Patient rates depression 5/10 (10 being worst), anxiety 5/10, and hopelessness 5/10. Pt denies SI/HI. She reports goods sleep last night. She denies AVH. It was reported by the MHT on the hall that he's observed the patient in her room talking to herself on several occassions.   Orders reviewed. Vital signs reviewed. Verbal support provided. 15 minute checks performed for safety.   Patient compliant with taking medications and denies any medication side effects

## 2020-01-25 NOTE — [facility] Group Notes (Signed)
LCSW Group Therapy Note  01/25/2020   11:00am   Type of Therapy and Topic:  Group Therapy: Anger Cues and Responses  Participation Level:  Active   Description of Group:   In this group, patients learned how to recognize the physical, cognitive, emotional, and behavioral responses they have to anger-provoking situations.  They identified a recent time they became angry and how they reacted.  They analyzed how their reaction was possibly beneficial and how it was possibly unhelpful.  The group discussed a variety of healthier coping skills that could help with such a situation in the future.  Focus was placed on how helpful it is to recognize the underlying emotions to our anger, because working on those can lead to a more permanent solution as well as our ability to focus on the important rather than the urgent.  Therapeutic Goals: 1. Patients will remember their last incident of anger and how they felt emotionally and physically, what their thoughts were at the time, and how they behaved. 2. Patients will identify how their behavior at that time worked for them, as well as how it worked against them. 3. Patients will explore possible new behaviors to use in future anger situations. 4. Patients will learn that anger itself is normal and cannot be eliminated, and that healthier reactions can assist with resolving conflict rather than worsening situations.  Summary of Patient Progress:    The patient was provided with the following information:  . That anger is a natural part of human life.  . That people can acquire effective coping skills and work toward having positive outcomes.  . The patient now understands that there emotional and physical cues associated with anger and that these can be used as warning signs alert them to step-back, regroup and use a coping skill.   Patient was encouraged to work on managing anger more effectively. Therapeutic Modalities:   Cognitive Behavioral  Therapy  Rolanda Jay

## 2020-01-25 NOTE — Progress Notes (Signed)
   01/25/20 2035  Psych Admission Type (Psych Patients Only)  Admission Status Involuntary  Psychosocial Assessment  Patient Complaints Anxiety  Eye Contact Brief  Facial Expression Anxious  Affect Anxious  Speech Logical/coherent  Interaction Minimal  Motor Activity Other (Comment) (WNL )  Appearance/Hygiene Unremarkable  Behavior Characteristics Cooperative;Appropriate to situation;Anxious  Mood Depressed  Thought Process  Coherency WDL  Content WDL  Delusions None reported or observed  Perception WDL  Hallucination None reported or observed  Judgment Poor  Confusion None  Danger to Self  Current suicidal ideation? Denies  Danger to Others  Danger to Others None reported or observed
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
## 2020-01-25 NOTE — Progress Notes (Signed)
Adult Psychoeducational Group Note  Date:  01/25/2020 Time:  10:54 PM  Group Topic/Focus:  Wrap-Up Group:   The focus of this group is to help patients review their daily goal of treatment and discuss progress on daily workbooks.  Participation Level:  Minimal  Participation Quality:  Drowsy  Affect:  Anxious, Flat, Irritable, Labile, Lethargic and Not Congruent  Cognitive:  Disorganized, Confused, Delusional, Hallucinating and Lacking  Insight: Lacking and Limited  Engagement in Group:  Lacking, Limited, Off Topic and Poor  Modes of Intervention:  Discussion  Additional Comments: Pt stated her goal for today was to focus on her treatment plan. Pt stated she felt she accomplished her goal today. Pt stated she could not remember if she  talk with her doctor or a  Education officer, museum, regarding her care today. Pt stated she took all her medication today from her providers. Pt rated her overall day a 4 out of 10 today.  Pt stated her appetite was pretty good today and she attend all meals today. Pt stated her sleep last night was good. Pt stated the goal for tonight was to get some rest. Pt stated she was in no physical pain. Pt admitted to dealing with some auditory issues tonight. Pt stated the last time she experience auditory issues was about 15 mins ago. Pt nurse was made aware of situation. Pt denied  visual hallucinations issues. Pt denies thoughts of harming herself or others. Pt stated she would alert staff if anything changes.     Candy Sledge 01/25/2020, 10:54 PM

## 2020-01-26 DIAGNOSIS — F250 Schizoaffective disorder, bipolar type: Secondary | ICD-10-CM | POA: Diagnosis not present

## 2020-01-26 LAB — TSH: TSH: 1.06 u[IU]/mL (ref 0.350–4.500)

## 2020-01-26 MED ORDER — OLANZAPINE 10 MG PO TABS
10.0000 mg | ORAL_TABLET | Freq: Every day | ORAL | Status: DC
  Administered 2020-01-27 – 2020-01-31 (×5): 10 mg via ORAL
  Filled 2020-01-26 (×7): qty 1

## 2020-01-26 MED ORDER — OLANZAPINE 10 MG PO TABS
20.0000 mg | ORAL_TABLET | Freq: Every day | ORAL | Status: DC
  Administered 2020-01-26: 20 mg via ORAL
  Filled 2020-01-26 (×3): qty 2

## 2020-01-26 NOTE — Progress Notes (Signed)
Patient has been isolative to her room tonight other than coming to take her medications. Writer and tech have not  heard her talking to herself in her room tonight. She went to sleep shortly after her medications.

## 2020-01-26 NOTE — [facility] Group Notes (Signed)
[facility] LCSW Group Therapy Note  Date/Time:  01/26/2020  11:00AM-12:00PM  Type of Therapy and Topic:  Group Therapy:  Music and Mood  Participation Level:  Active   Description of Group: In this process group, members listened to a variety of genres of music and identified that different types of music evoke different responses.  Patients were encouraged to identify music that was soothing for them and music that was energizing for them.  Patients discussed how this knowledge can help with wellness and recovery in various ways including managing depression and anxiety as well as encouraging healthy sleep habits.    Therapeutic Goals: 1. Patients will explore the impact of different varieties of music on mood 2. Patients will verbalize the thoughts they have when listening to different types of music 3. Patients will identify music that is soothing to them as well as music that is energizing to them 4. Patients will discuss how to use this knowledge to assist in maintaining wellness and recovery 5. Patients will explore the use of music as a coping skill  Summary of Patient Progress:  At the beginning of group, patient expressed good mood.  At the end of group, patient expressed mood appeared the same as patient responded to internal stimuli throughout group.    Therapeutic Modalities: Solution Focused Brief Therapy Activity   Rolanda Jay , LCSW

## 2020-01-26 NOTE — Progress Notes (Signed)
Adult Psychoeducational Group Note  Date:  01/26/2020 Time:  11:15 PM  Group Topic/Focus:  Wrap-Up Group:   The focus of this group is to help patients review their daily goal of treatment and discuss progress on daily workbooks.  Participation Level:  Did Not Attend  Participation Quality:  Did Not Attend  Affect:  Did Not Attend  Cognitive:  Did Not Attend  Insight: None  Engagement in Group:  Did Not Attend  Modes of Intervention:  Did Not Attend  Additional Comments:  Pt did not attend evening wrap up group tonight.  Candy Sledge 01/26/2020, 11:15 PM
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
## 2020-01-26 NOTE — Progress Notes (Signed)
Patient denies SI/HI. She denies AVH but appears to be responding to internal stimuli and has been observed talking to herself in her room and the dayroom. She reports feeling paranoid like someone is out to get her. She reports fair sleep at bedtime.   Orders reviewed. Vital signs reviewed. Verbal support provided. 15 minute checks performed for safety.   Patient compliant with taking meds and denies any medication side effects.

## 2020-01-27 DIAGNOSIS — F250 Schizoaffective disorder, bipolar type: Secondary | ICD-10-CM | POA: Diagnosis not present

## 2020-01-27 MED ORDER — MUPIROCIN CALCIUM 2 % EX CREA
TOPICAL_CREAM | Freq: Three times a day (TID) | CUTANEOUS | Status: DC
  Filled 2020-01-27 (×2): qty 15

## 2020-01-27 MED ORDER — BACITRACIN-NEOMYCIN-POLYMYXIN OINTMENT TUBE
TOPICAL_OINTMENT | Freq: Three times a day (TID) | CUTANEOUS | Status: DC
  Filled 2020-01-27 (×2): qty 14.17

## 2020-01-27 MED ORDER — OLANZAPINE 5 MG PO TABS
25.0000 mg | ORAL_TABLET | Freq: Every day | ORAL | Status: DC
  Administered 2020-01-27 – 2020-01-30 (×4): 25 mg via ORAL
  Filled 2020-01-27 (×6): qty 1
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
## 2020-01-27 NOTE — Progress Notes (Signed)
   01/27/20 1000  Psych Admission Type (Psych Patients Only)  Admission Status Involuntary  Psychosocial Assessment  Patient Complaints None  Eye Contact Brief  Facial Expression Flat  Affect Appropriate to circumstance  Speech Logical/coherent  Interaction Minimal;No initiation;Isolative  Motor Activity Fidgety  Appearance/Hygiene Unremarkable  Behavior Characteristics Appropriate to situation  Mood Depressed;Preoccupied  Thought Process  Coherency WDL  Content WDL  Delusions None reported or observed  Perception WDL  Hallucination Auditory  Judgment Poor  Confusion None  Danger to Self  Current suicidal ideation? Denies  Danger to Others  Danger to Others None reported or observed

## 2020-01-27 NOTE — Plan of Care (Signed)
  Problem: Education: Goal: Emotional status will improve Outcome: Progressing   Problem: Education: Goal: Mental status will improve Outcome: Progressing   Problem: Activity: Goal: Interest or engagement in activities will improve Outcome: Progressing   Problem: Activity: Goal: Sleeping patterns will improve Outcome: Progressing

## 2020-01-27 NOTE — Progress Notes (Signed)
Adult Psychoeducational Group Note  Date:  01/27/2020 Time:  6:01 PM  Group Topic/Focus:  Dimensions of Wellness:   The focus of this group is to introduce the topic of wellness and discuss the role each dimension of wellness plays in total health.  Participation Level:  Minimal  Participation Quality:  Appropriate  Affect:  Appropriate  Cognitive:  Appropriate  Insight: Appropriate  Engagement in Group:  Engaged  Modes of Intervention:  Discussion and Education  Additional Comments:  Pt participation was minimal during the group session.  Temitope Flammer E 01/27/2020, 6:01 PM

## 2020-01-27 NOTE — [facility] Group Notes (Signed)
Occupational Therapy Group Note Date: 01/27/2020 Group Topic/Focus: Self-Esteem and Coping Skills  Group Description: Group encouraged increased engagement and participation through discussion and activity focused on building self-esteem through positive affirmations. Patients were given multiple positive affirmation coloring sheets to choose from and were instructed to choose one that was important to them. Patients were then offered colored pencils to actively color and engage in activity with other group members and group leader.   Participation Level: Active   Participation Quality: Independent   Behavior: Calm and Cooperative   Speech/Thought Process: Directed   Affect/Mood: Constricted   Insight: Fair   Judgement: Fair   Individualization: Areal was active and accepting of invite to engage in activity presented. Pt chose two positive affirmation coloring sheets to complete "you are stronger than you think" and "you are not alone" and worked on them independently in her bedroom.   Modes of Intervention: Activity and Socialization  Patient Response to Interventions:  Engaged   Plan: Continue to engage patient in OT groups 2 - 3x/week.  01/27/2020  Ponciano Ort, MOT, OTR/L

## 2020-01-27 NOTE — Progress Notes (Signed)
Adult Psychoeducational Group Note  Date:  01/27/2020 Time:  10:46 PM  Group Topic/Focus:  Wrap-Up Group:   The focus of this group is to help patients review their daily goal of treatment and discuss progress on daily workbooks.  Participation Level:  Did Not Attend  Participation Quality:  Did Not Attend  Affect:  Did Not Attend  Cognitive:  Did Not Attend  Insight: None  Engagement in Group:  Did Not Attend  Modes of Intervention:  Did Not Attend  Additional Comments:  Pt did not attend evening wrap up group tonight.  Candy Sledge 01/27/2020, 10:46 PM

## 2020-01-28 ENCOUNTER — Other Ambulatory Visit: Payer: Medicaid Other | Admitting: Obstetrics & Gynecology

## 2020-01-28 DIAGNOSIS — F250 Schizoaffective disorder, bipolar type: Secondary | ICD-10-CM | POA: Diagnosis not present

## 2020-01-28 NOTE — Progress Notes (Signed)
   01/28/20 2051  Psych Admission Type (Psych Patients Only)  Admission Status Involuntary  Psychosocial Assessment  Patient Complaints Anxiety  Eye Contact Brief  Facial Expression Flat  Affect Appropriate to circumstance  Speech Logical/coherent  Interaction Minimal;No initiation;Isolative  Motor Activity Slow  Appearance/Hygiene Unremarkable  Behavior Characteristics Cooperative  Mood Anxious  Thought Process  Coherency WDL  Content WDL  Delusions None reported or observed  Perception WDL  Hallucination None reported or observed  Judgment Poor  Confusion None  Danger to Self  Current suicidal ideation? Denies  Danger to Others  Danger to Others None reported or observed   Pt rates anxiety 5/10 this evening. States that she is still not sure she will return to live with her fiance after discharge. Pt right arm looks much improved. Less edema and bruising. Still some discomfort. Pt denies SI, HI, AVH and pain.

## 2020-01-28 NOTE — Progress Notes (Signed)
   01/28/20 1100  Psych Admission Type (Psych Patients Only)  Admission Status Involuntary  Psychosocial Assessment  Patient Complaints Depression  Eye Contact Brief  Facial Expression Flat  Affect Appropriate to circumstance  Speech Logical/coherent  Interaction Minimal;No initiation;Isolative  Motor Activity Fidgety  Appearance/Hygiene Unremarkable  Behavior Characteristics Cooperative  Mood Depressed  Thought Process  Coherency WDL  Content WDL  Delusions None reported or observed  Perception WDL  Hallucination Auditory  Judgment Poor  Confusion None  Danger to Self  Current suicidal ideation? Denies  Danger to Others  Danger to Others None reported or observed
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
## 2020-01-29 ENCOUNTER — Inpatient Hospital Stay (HOSPITAL_COMMUNITY): Payer: Medicaid Other

## 2020-01-29 DIAGNOSIS — F250 Schizoaffective disorder, bipolar type: Secondary | ICD-10-CM | POA: Diagnosis not present

## 2020-01-29 MED ORDER — CEPHALEXIN 500 MG PO CAPS
500.0000 mg | ORAL_CAPSULE | Freq: Four times a day (QID) | ORAL | Status: DC
  Administered 2020-01-29 – 2020-02-01 (×9): 500 mg via ORAL
  Filled 2020-01-29 (×12): qty 1
  Filled 2020-01-29: qty 2
  Filled 2020-01-29 (×6): qty 1
  Filled 2020-01-29: qty 2
  Filled 2020-01-29: qty 1
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
## 2020-01-29 NOTE — Tx Team (Signed)
Interdisciplinary Treatment and Diagnostic Plan Update  01/29/2020 Time of Session: 10:00am Meredith [name] MRN: [id number]  Principal Diagnosis: <principal problem not specified>  Secondary Diagnoses: Active Problems:   Bipolar 1 disorder (HCC)   Current Medications:  Current Facility-Administered Medications  Medication Dose Route Frequency Provider Last Rate Last Admin  . acetaminophen (TYLENOL) tablet 650 mg  650 mg Oral Q6H PRN Cristofano, Dorene Ar, MD   650 mg at 01/29/20 0825  . acidophilus (RISAQUAD) capsule 2 capsule  2 capsule Oral TID Dixie Dials, MD   2 capsule at 01/29/20 0817  . albuterol (VENTOLIN HFA) 108 (90 Base) MCG/ACT inhaler 2 puff  2 puff Inhalation Q6H PRN Cristofano, Dorene Ar, MD      . alum & mag hydroxide-simeth (MAALOX/MYLANTA) 200-200-20 MG/5ML suspension 30 mL  30 mL Oral Q4H PRN Cristofano, Paul A, MD      . buprenorphine-naloxone (SUBOXONE) 2-0.5 mg per SL tablet 2 tablet  2 tablet Sublingual BID Cristofano, Dorene Ar, MD   2 tablet at 01/29/20 8657  . fluticasone (FLONASE) 50 MCG/ACT nasal spray 1 spray  1 spray Each Nare BID Cristofano, Dorene Ar, MD   1 spray at 01/29/20 0817  . furosemide (LASIX) tablet 20 mg  20 mg Oral PRN Cristofano, Dorene Ar, MD      . hydrOXYzine (ATARAX/VISTARIL) tablet 25 mg  25 mg Oral TID PRN Lindon Romp A, NP   25 mg at 01/28/20 1753  . levothyroxine (SYNTHROID) tablet 25 mcg  25 mcg Oral Daily Cristofano, Dorene Ar, MD   25 mcg at 01/29/20 0819  . magnesium hydroxide (MILK OF MAGNESIA) suspension 30 mL  30 mL Oral Daily PRN Cristofano, Paul A, MD      . montelukast (SINGULAIR) tablet 10 mg  10 mg Oral QHS Cristofano, Dorene Ar, MD   10 mg at 01/28/20 2051  . mupirocin cream (BACTROBAN) 2 %   Topical TID Sharma Covert, MD   Given at 01/29/20 [phone]  . neomycin-bacitracin-polymyxin (NEOSPORIN) ointment   Topical TID Sharma Covert, MD   Given at 01/29/20 0818  . OLANZapine (ZYPREXA) tablet 10 mg  10 mg Oral Daily Sharma Covert, MD   10 mg at 01/29/20 0817  . OLANZapine (ZYPREXA) tablet 25 mg  25 mg Oral QHS Sharma Covert, MD   25 mg at 01/28/20 2051  . pantoprazole (PROTONIX) EC tablet 80 mg  80 mg Oral Q1200 Cristofano, Dorene Ar, MD   80 mg at 01/28/20 1221  . polyethylene glycol (MIRALAX / GLYCOLAX) packet 17 g  17 g Oral Daily PRN Sharma Covert, MD   17 g at 01/29/20 0817  . pregabalin (LYRICA) capsule 50 mg  50 mg Oral TID Dixie Dials, MD   50 mg at 01/29/20 0821  . sulfamethoxazole-trimethoprim (BACTRIM DS) 800-160 MG per tablet 1 tablet  1 tablet Oral BID Cristofano, Dorene Ar, MD   1 tablet at 01/29/20 0819  . tamsulosin (FLOMAX) capsule 0.4 mg  0.4 mg Oral Daily Cristofano, Dorene Ar, MD   0.4 mg at 01/29/20 0819  . traZODone (DESYREL) tablet 50 mg  50 mg Oral QHS PRN Rozetta Nunnery, NP   50 mg at 01/28/20 2051   PTA Medications: Medications Prior to Admission  Medication Sig Dispense Refill Last Dose  . albuterol (PROVENTIL HFA;VENTOLIN HFA) 108 (90 Base) MCG/ACT inhaler Inhale 2 puffs into the lungs every 6 (six) hours as needed for wheezing or shortness of breath.     Marland Kitchen  Buprenorphine HCl-Naloxone HCl (SUBOXONE) 4-1 MG FILM Place 1 Film under the tongue in the morning and at bedtime.     Marland Kitchen EPINEPHrine 0.3 mg/0.3 mL IJ SOAJ injection Inject 0.3 mg into the muscle daily as needed (for anaphylatic allergic reactions).      . esomeprazole (NEXIUM) 40 MG capsule Take 1 capsule (40 mg total) by mouth daily at 12 noon. 30 capsule 6   . fluticasone (FLONASE) 50 MCG/ACT nasal spray Place 1 spray into both nostrils.     . furosemide (LASIX) 20 MG tablet Take 20 mg by mouth as needed.      . hydrOXYzine (ATARAX/VISTARIL) 50 MG tablet Take 50 mg by mouth 3 (three) times daily.     Marland Kitchen LATUDA 40 MG TABS tablet Take 40 mg by mouth daily.     Marland Kitchen levothyroxine (SYNTHROID, LEVOTHROID) 25 MCG tablet Take 25 mcg by mouth daily.      Marland Kitchen lidocaine (XYLOCAINE) 5 % ointment Apply topically.     . montelukast  (SINGULAIR) 10 MG tablet Take 10 mg by mouth at bedtime.     . naloxegol oxalate (MOVANTIK) 12.5 MG TABS tablet Take 1 tablet (12.5 mg total) by mouth daily. 90 tablet 1   . ondansetron (ZOFRAN-ODT) 8 MG disintegrating tablet Take 1 tablet (8 mg total) by mouth every 12 (twelve) hours as needed for nausea or vomiting. 20 tablet 1   . potassium chloride (KLOR-CON) 10 MEQ tablet Take 10 mEq by mouth daily.     . pregabalin (LYRICA) 50 MG capsule Take 50 mg by mouth 3 (three) times daily.     Marland Kitchen SPIRIVA HANDIHALER 18 MCG inhalation capsule INHALE 1 CAPSULE BY MOUTHTDAILY.M     . sulfamethoxazole-trimethoprim (BACTRIM) 400-80 MG tablet Take 1 tablet by mouth 2 (two) times daily.     . tamsulosin (FLOMAX) 0.4 MG CAPS capsule Take 0.4 mg by mouth as needed.     . traZODone (DESYREL) 50 MG tablet Take 50 mg by mouth at bedtime.       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   Physician Treatment Plan for Primary Diagnosis: <principal problem not specified> Long Term Goal(s): Improvement in symptoms so as ready for discharge   Short Term Goals: Ability to identify changes in lifestyle to reduce recurrence of condition will improve Ability to verbalize feelings will improve Ability to demonstrate self-control will improve Ability to identify and develop effective coping behaviors will improve Ability to maintain clinical measurements within normal limits will improve Compliance with prescribed medications will improve Ability to identify triggers associated with substance abuse/mental health issues will improve  Medication Management: Evaluate patient's response, side effects, and tolerance of medication  regimen.  Therapeutic Interventions: 1 to 1 sessions, Unit Group sessions and Medication administration.  Evaluation of Outcomes: Progressing  Physician Treatment Plan for Secondary Diagnosis: Active Problems:   Bipolar 1 disorder ([place])  Long Term Goal(s): Improvement in symptoms so as ready for discharge   Short Term Goals: Ability to identify changes in lifestyle to reduce recurrence of condition will improve Ability to verbalize feelings will improve Ability to demonstrate self-control will improve Ability to identify and develop effective coping behaviors will improve Ability to maintain clinical measurements within normal limits will improve Compliance with prescribed medications will improve Ability to identify triggers associated with  substance abuse/mental health issues will improve     Medication Management: Evaluate patient's response, side effects, and tolerance of medication regimen.  Therapeutic Interventions: 1 to 1 sessions, Unit Group sessions and Medication administration.  Evaluation of Outcomes: Progressing   RN Treatment Plan for Primary Diagnosis: <principal problem not specified> Long Term Goal(s): Knowledge of disease and therapeutic regimen to maintain health will improve  Short Term Goals: Ability to remain free from injury will improve, Ability to verbalize frustration and anger appropriately will improve, Ability to identify and develop effective coping behaviors will improve and Compliance with prescribed medications will improve  Medication Management: RN will administer medications as ordered by provider, will assess and evaluate patient's response and provide education to patient for prescribed medication. RN will report any adverse and/or side effects to prescribing provider.  Therapeutic Interventions: 1 on 1 counseling sessions, Psychoeducation, Medication administration, Evaluate responses to treatment, Monitor vital signs and CBGs as ordered,  Perform/monitor CIWA, COWS, AIMS and Fall Risk screenings as ordered, Perform wound care treatments as ordered.  Evaluation of Outcomes: Progressing   LCSW Treatment Plan for Primary Diagnosis: <principal problem not specified> Long Term Goal(s): Safe transition to appropriate next level of care at discharge, Engage patient in therapeutic group addressing interpersonal concerns.  Short Term Goals: Engage patient in aftercare planning with referrals and resources, Increase social support, Identify triggers associated with mental health/substance abuse issues and Increase skills for wellness and recovery  Therapeutic Interventions: Assess for all discharge needs, 1 to 1 time with Social worker, Explore available resources and support systems, Assess for adequacy in community support network, Educate family and significant other(s) on suicide prevention, Complete Psychosocial Assessment, Interpersonal group therapy.  Evaluation of Outcomes: Progressing   Progress in Treatment: Attending groups: No. Participating in groups: No. Taking medication as prescribed: Yes. Toleration medication: Yes. Family/Significant other contact made: No, will contact:  declined consents Patient understands diagnosis: Yes. Discussing patient identified problems/goals with staff: Yes. Medical problems stabilized or resolved: Yes. Denies suicidal/homicidal ideation: Yes. Issues/concerns per patient self-inventory: No.   New problem(s) identified: No, Describe:  none  New Short Term/Long Term Goal(s): medication stabilization, elimination of SI thoughts, development of comprehensive mental wellness plan.   Patient Goals:  "Just to go home"  Discharge Plan or Barriers: Patient has been referred for services with [facility]  Reason for Continuation of Hospitalization: Anxiety Depression Medication stabilization  Estimated Length of Stay: 3-5 Days  Attendees: Patient:  01/24/2020   Physician:  01/24/2020   Nursing:  01/24/2020   RN Care Manager: 01/24/2020   Social Worker: Freddi Che, LCSW 01/24/2020   Recreational Therapist:  01/24/2020  Other:  01/24/2020   Other:  01/24/2020  Other: 01/24/2020       Scribe for Treatment Team: Freddi Che, LCSW 01/29/2020 9:23 AM

## 2020-01-30 DIAGNOSIS — F250 Schizoaffective disorder, bipolar type: Secondary | ICD-10-CM | POA: Diagnosis not present

## 2020-01-30 NOTE — [facility] Counselor (Signed)
CSW spoke with this patients partner, [name] [phone]) who stated that he has noticed improvement with this patient since talking to her on the phone. Per Mr. Laveda Abbe this patient is able to return home. CSW provided Mr. Laveda Abbe with mobile crisis information and discussed safety planning for this patient.    Darletta Moll MSW, LCSW Clincal Social Worker  [facility]
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
## 2020-01-30 NOTE — Progress Notes (Signed)
Patient has been in her room for the majority of the shift.  Patient denies SI, HI and AVH this shift.  Patient has been compliant with medications.  Patient has been noted to have a flat depressed affect and is minimal in conversation.   Assess patient for safety, offer medications as prescribed, engage patient in 1:1 staff talks.   Continue to monitor for safety.

## 2020-01-30 NOTE — Progress Notes (Signed)
   01/30/20 2200  Psych Admission Type (Psych Patients Only)  Admission Status Involuntary  Psychosocial Assessment  Patient Complaints Anxiety  Eye Contact Brief  Facial Expression Flat  Affect Appropriate to circumstance  Speech Logical/coherent  Interaction Minimal  Motor Activity Slow  Appearance/Hygiene Unremarkable  Behavior Characteristics Cooperative  Mood Anxious  Thought Process  Coherency WDL  Content WDL  Delusions None reported or observed  Perception WDL  Hallucination None reported or observed  Judgment Poor  Confusion None  Danger to Self  Current suicidal ideation? Denies  Danger to Others  Danger to Others None reported or observed

## 2020-01-31 DIAGNOSIS — K2100 Gastro-esophageal reflux disease with esophagitis, without bleeding: Secondary | ICD-10-CM

## 2020-01-31 DIAGNOSIS — Z720 Tobacco use: Secondary | ICD-10-CM

## 2020-01-31 DIAGNOSIS — M545 Low back pain, unspecified: Secondary | ICD-10-CM

## 2020-01-31 DIAGNOSIS — R519 Headache, unspecified: Secondary | ICD-10-CM

## 2020-01-31 DIAGNOSIS — F411 Generalized anxiety disorder: Secondary | ICD-10-CM

## 2020-01-31 MED ORDER — LURASIDONE HCL 40 MG PO TABS
120.0000 mg | ORAL_TABLET | Freq: Every day | ORAL | Status: DC
  Administered 2020-01-31: 120 mg via ORAL
  Filled 2020-01-31: qty 3
  Filled 2020-01-31 (×2): qty 2

## 2020-01-31 MED ORDER — TRAZODONE HCL 150 MG PO TABS
150.0000 mg | ORAL_TABLET | Freq: Every day | ORAL | Status: DC
  Administered 2020-01-31: 150 mg via ORAL
  Filled 2020-01-31 (×3): qty 1

## 2020-01-31 NOTE — Progress Notes (Signed)
Patient has been in her room for the majority of the shift.  Patient denies SI, HI and AVH this shift.  Patient has been compliant with medications.  Patient has been noted to have a flat depressed affect and is minimal in conversation.   Assess patient for safety, offer medications as prescribed, engage patient in 1:1 staff talks.   Continue to monitor for safety.

## 2020-01-31 NOTE — Progress Notes (Signed)
Adult Psychoeducational Group Note  Date:  01/31/2020 Time:  9:49 PM  Group Topic/Focus:  Wrap-Up Group:   The focus of this group is to help patients review their daily goal of treatment and discuss progress on daily workbooks.  Participation Level:  Minimal  Participation Quality:  Inattentive  Affect:  Appropriate  Cognitive:  Lacking  Insight: Limited  Engagement in Group:  None  Modes of Intervention:  Education and Support  Additional Comments: Meredith [name] not name a goal she worked on today.   Meredith [name]  Meredith [name] 01/31/2020, 9:49 PM

## 2020-01-31 NOTE — Progress Notes (Signed)
Pt stated she felt better, pt stated she was glad to have the [place]    01/31/20 2200  Psych Admission Type (Psych Patients Only)  Admission Status Involuntary  Psychosocial Assessment  Patient Complaints Anxiety  Eye Contact Brief  Facial Expression Flat  Affect Appropriate to circumstance  Speech Logical/coherent  Interaction Minimal  Motor Activity Slow  Appearance/Hygiene Unremarkable  Behavior Characteristics Cooperative  Mood Anxious  Thought Process  Coherency WDL  Content WDL  Delusions None reported or observed  Perception WDL  Hallucination None reported or observed  Judgment Poor  Confusion None  Danger to Self  Current suicidal ideation? Denies  Danger to Others  Danger to Others None reported or observed
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
## 2020-01-31 NOTE — [facility] Group Notes (Signed)
[facility] LCSW Group Therapy  01/31/2020 11:54 AM  Type of Therapy:  Group Therapy  Participation Level:  Minimal  Participation Quality:  Inattentive  Affect:  Depressed  Cognitive:  Appropriate  Insight:  Developing/Improving  Engagement in Therapy:  Limited  Modes of Intervention:  Activity, Discussion and Education  Summary of Progress/Problems: Patient attended group, however offered limited engagement in group discussion. This patient left group early.   Meredith [name] A Zella Dewan 01/31/2020, 11:54 AM

## 2020-02-01 MED ORDER — RISAQUAD PO CAPS: 2.0000 | ORAL_CAPSULE | Freq: Three times a day (TID) | ORAL | 0 refills | Status: AC

## 2020-02-01 MED ORDER — CEPHALEXIN 500 MG PO CAPS: 500.0000 mg | ORAL_CAPSULE | Freq: Four times a day (QID) | ORAL | 0 refills | Status: AC

## 2020-02-01 MED ORDER — POLYETHYLENE GLYCOL 3350 17 G PO PACK: 17.0000 g | PACK | Freq: Every day | ORAL | 0 refills | Status: DC | PRN

## 2020-02-01 MED ORDER — LEVOTHYROXINE SODIUM 25 MCG PO TABS: 25.0000 ug | ORAL_TABLET | Freq: Every day | ORAL | 0 refills | Status: DC

## 2020-02-01 MED ORDER — MONTELUKAST SODIUM 10 MG PO TABS
10.0000 mg | ORAL_TABLET | Freq: Every day | ORAL | 0 refills | Status: DC
Start: 2020-02-01 — End: 2021-07-22

## 2020-02-01 MED ORDER — LURASIDONE HCL 120 MG PO TABS
120.0000 mg | ORAL_TABLET | Freq: Every day | ORAL | 0 refills | Status: DC
Start: 2020-02-01 — End: 2020-05-29

## 2020-02-01 MED ORDER — MUPIROCIN CALCIUM 2 % EX CREA
TOPICAL_CREAM | Freq: Three times a day (TID) | CUTANEOUS | 0 refills | Status: DC
Start: 2020-02-01 — End: 2020-05-29

## 2020-02-01 MED ORDER — HYDROXYZINE HCL 25 MG PO TABS
25.0000 mg | ORAL_TABLET | Freq: Three times a day (TID) | ORAL | 0 refills | Status: DC | PRN
Start: 2020-02-01 — End: 2020-05-29

## 2020-02-01 MED ORDER — FUROSEMIDE 20 MG PO TABS: 20.0000 mg | ORAL_TABLET | ORAL | 0 refills | Status: DC | PRN

## 2020-02-01 MED ORDER — BACITRACIN-NEOMYCIN-POLYMYXIN OINTMENT TUBE
1.0000 | TOPICAL_OINTMENT | Freq: Three times a day (TID) | CUTANEOUS | 0 refills | Status: DC
Start: 2020-02-01 — End: 2020-05-29

## 2020-02-01 MED ORDER — TAMSULOSIN HCL 0.4 MG PO CAPS: 0.4000 mg | ORAL_CAPSULE | Freq: Every day | ORAL | 0 refills | Status: DC

## 2020-02-01 MED ORDER — TRAZODONE HCL 150 MG PO TABS: 150.0000 mg | ORAL_TABLET | Freq: Every day | ORAL | 0 refills | Status: DC

## 2020-02-01 MED ORDER — FLUTICASONE PROPIONATE 50 MCG/ACT NA SUSP
1.0000 | Freq: Two times a day (BID) | NASAL | 2 refills | Status: DC
Start: 2020-02-01 — End: 2021-11-25

## 2020-02-01 MED ORDER — PREGABALIN 50 MG PO CAPS
50.0000 mg | ORAL_CAPSULE | Freq: Three times a day (TID) | ORAL | 0 refills | Status: DC
Start: 2020-02-01 — End: 2020-05-29

## 2020-02-01 MED ORDER — POTASSIUM CHLORIDE ER 10 MEQ PO TBCR
10.0000 meq | EXTENDED_RELEASE_TABLET | Freq: Every day | ORAL | 0 refills | Status: DC | PRN
Start: 2020-02-01 — End: 2020-05-29

## 2020-02-01 MED ORDER — ALBUTEROL SULFATE HFA 108 (90 BASE) MCG/ACT IN AERS
2.0000 | INHALATION_SPRAY | Freq: Four times a day (QID) | RESPIRATORY_TRACT | 0 refills | Status: AC | PRN
Start: 2020-02-01 — End: ?

## 2020-02-01 NOTE — [facility] Group Notes (Signed)
Psychoeducational Group Note  Date: 02-01-20 Time:  9767-3419  Group Topic/Focus:  Identifying Needs:   The focus of this group is to help patients identify their personal needs that have been historically problematic and identify healthy behaviors to address their needs.  Participation Level:  {Did not attend  Meredith [name]

## 2020-02-01 NOTE — Progress Notes (Signed)
°  [facility] Adult Case Management Discharge Plan :  Will you be returning to the same living situation after discharge:  Yes,  home with husband At discharge, do you have transportation home?: Yes,  via husband Do you have the ability to pay for your medications: Yes,  [facility]  Release of information consent forms completed and in the chart;  Patient's signature needed at discharge.  Patient to Follow up at:  Follow-up [facility] Follow up on 02/21/2020.   Why: You have an assessment appointment for medication management on 02/21/20 on 1:30 pm,  This will be a Virtual appointment.  Contact information: [address] [place] [address] [phone]               Next level of care provider has access to [place] and Suicide Prevention discussed: Yes,  with pt     Has patient been referred to the Quitline?: Patient refused referral  Patient has been referred for addiction treatment: N/A  Mliss Fritz, [place] 02/01/2020, 1:47 PM

## 2020-02-01 NOTE — Progress Notes (Signed)
D: Pt A & O X 3. Denies SI, HI, AVH and pain at this time. D/C home as ordered. Picked up in lobby by "my husband". A: D/C instructions reviewed with pt including prescriptions and follow up appointment; compliance encouraged. All belongings from locker 34 returned to pt at time of departure. Scheduled and PRN medications given with verbal education and effects monitored. Safety checks maintained without incident till time of d/c.  R: Pt receptive to care. Compliant with medications when offered. Denies adverse drug reactions when assessed. Verbalized understanding related to d/c instructions. Signed belonging sheet in agreement with items received from locker. Ambulatory with a steady gait. Appears to be in no physical distress at time of departure.

## 2020-02-01 NOTE — [facility] Suicide Risk Assessment (Signed)
[facility] Discharge Suicide Risk Assessment   Principal Problem: Schizoaffective disorder, bipolar type [facility]) Discharge Diagnoses: Principal Problem:   Schizoaffective disorder, bipolar type ([place]) Active Problems:   Palpitations   Elevated blood pressure reading without diagnosis of hypertension   Tobacco abuse   Dysphagia   Low back pain   Lumbar spondylosis   Generalized anxiety disorder   Obesity (BMI 30.0-34.9)   GERD (gastroesophageal reflux disease)   Nausea with vomiting   Dizziness   Headache disorder   Total Time spent with patient: 35 minutes   HPI:  Patient is a 54 year old female with a past psychiatric history significant for schizophrenia and also suggested bipolar disorder who presented to the [place] emergency department on 01/20/2020 via [place] under emergency commitment papers filed to [employer]. Police had been called to the home and found out from the husband the patient been acting erratically recently, and was claiming that someone was trying to cut her head off.  Patient has been medically stabilized during her hospitalization.  She has been compliant with medications which have made significant improvements in her paranoia and delusions.  She has been attending groups with appropriate interaction.  A family meeting with her significant other, [name] [phone]) who agrees that patient has improved.  There are no weapons in the home that patient has access to, significant others gun is locked up.  He is needed aware of her medication regimens and is agreeable to take this medication appropriately.  He is aware that should her symptoms worsen that she should be return to nearest emergency department or call 911.  Patient and significant other are agreeable to plan for discharge today.  Follow-up with outpatient therapy and psychiatrist has been scheduled.  Musculoskeletal: Strength & Muscle Tone: within normal limits Gait &  Station: normal Patient leans: N/A  Psychiatric Specialty Exam: Review of Systems  Constitutional: Negative.   HENT: Negative.   Respiratory: Negative.   Cardiovascular: Negative.  Negative for leg swelling.  Gastrointestinal: Negative.   Musculoskeletal: Negative.   Neurological: Negative.   Psychiatric/Behavioral: Negative for agitation, behavioral problems, confusion, decreased concentration, dysphoric mood, hallucinations, self-injury, sleep disturbance and suicidal ideas. The patient is not nervous/anxious and is not hyperactive.     Blood pressure 129/69, pulse 64, temperature 97.8 F (36.6 C), temperature source Oral, resp. rate 18, height 5' 2.5" (1.588 m), weight 65.3 kg, SpO2 100 %.Body mass index is 25.92 kg/m.  General Appearance: Casual and Neat  Eye Contact::  Good  Speech:  Clear and Coherent and Normal Rate409  Volume:  Normal  Mood:  Euthymic  Affect:  Congruent  Thought Process:  Coherent, Goal Directed, Linear and Descriptions of Associations: Intact  Orientation:  Full (Time, Place, and Person)  Thought Content:  Logical and Hallucinations: None  Suicidal Thoughts:  No  Homicidal Thoughts:  No  Memory:  Immediate;   Good Recent;   Good Remote;   Good  Judgement:  Fair  Insight:  Fair  Psychomotor Activity:  Normal  Concentration:  Good  Recall:  Good  Fund of Knowledge:Good  Language: Good  Akathisia:  No  Handed:  Right  AIMS (if indicated):     Assets:  Communication Skills Desire for Improvement Housing Intimacy Leisure Time Physical Health Resilience Social Support  Sleep:  Number of Hours: 6.25  Cognition: WNL  ADL's:  Intact   Mental Status Per Nursing Assessment::   On Admission:  NA delusional with hallucinations  Demographic Factors:  Caucasian  and Unemployed  Loss Factors: NA  Historical Factors: Family history of mental illness or substance abuse, Impulsivity and History of self-harm  Risk Reduction Factors:   Sense of  responsibility to family, Living with another person, especially a relative, Positive social support, Positive therapeutic relationship and Positive coping skills or problem solving skills  Continued Clinical Symptoms:  Anxiety Suboxone dependency Schizoaffective disorder Chronic Pain More than one psychiatric diagnosis Medical Diagnoses and Treatments/Surgeries  Cognitive Features That Contribute To Risk:  None    Suicide Risk:  Minimal: No identifiable suicidal ideation.  Patients presenting with no risk factors but with morbid ruminations; may be classified as minimal risk based on the severity of the depressive symptoms   [place] Follow up on 02/21/2020.   Why: You have an assessment appointment for medication management on 02/21/20 on 1:30 pm,  This will be a Virtual appointment.  Contact information: [address] [place] [address] [phone]               Plan Of Care/Follow-up recommendations:  Activity:  Ad lib. Diet:  As tolerated Other:  Ensure patient has follow-up with her Suboxone treatment team after discharge   On day of discharge following sustained improvement in the affect of this patient, continued report of euthymic mood, repeated denial of suicidal, homicidal, and other violent ideation, adequate interaction with peers, active participation in groups while on the unit, and denial of adverse reactions from medications, the treatment team decided Meredith [name] was stable for discharge home with scheduled mental health treatment.  She was able to engage in safety planning including plan to return to nearest emergency room or contact emergency services if she feels unable to maintain her own safety or the safety of others. Patient had no further questions, comments, or concerns.  Discharge into care of significant other, [name], who agrees to maintain patient safety.  Patient aware to return  to nearest crisis center, ED or to call 911 for worsening symptoms of depression, suicidal or homicidal thoughts or AVH.    Lavella Hammock, MD 02/01/2020, 1:37 PM

## 2020-02-01 NOTE — Progress Notes (Signed)
Pt wants to report that she has a yeast infection from the antibiotics that she was on recently.  She would like to request medication to treat this.  She was also instructed to discuss this with the doctor on rounds today.

## 2020-02-01 NOTE — Progress Notes (Signed)
Pt up much of the night appearing to have hard time sleeping. Don't know if it is the different medication or the roommate in the room

## 2020-02-01 NOTE — [facility] Group Notes (Signed)
.  Psychoeducational Group Note    Date: 02-01-20 Time: 0900-1000    Goal Setting   Purpose of Group: This group helps to provide patients with the steps of setting a goal that is specific, measurable, attainable, realistic and time specific. A discussion on how we keep ourselves stuck with negative self talk.    Participation Level:  Active  Participation Quality:  Appropriate  Affect:  Appropriate  Cognitive:  Appropriate  Insight:  Improving  Engagement in Group:  Engaged  Additional Comments:  Pt was able to set a goal for the day.  Paulino Rily

## 2020-02-01 NOTE — Discharge Summary (Signed)
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   hydrOXYzine 25 MG tablet Commonly known as: ATARAX/VISTARIL Take 1 tablet (25 mg total) by mouth 3 (three) times daily as needed for anxiety. What changed:   medication strength  how much to take  when to take this  reasons to take this  Indication: Feeling Anxious   levothyroxine 25 MCG tablet Commonly known as: SYNTHROID Take 1 tablet (25 mcg total) by mouth daily.  Indication: Underactive Thyroid   Lurasidone HCl 120 MG Tabs Take 1 tablet (120 mg total) by mouth daily with supper. What changed:   medication strength  how much to take  when to take this  Indication: Schizoaffective disorder   montelukast 10 MG tablet Commonly known as: SINGULAIR Take 1 tablet (10 mg total) by mouth at bedtime.  Indication: Perennial Allergic Rhinitis   mupirocin cream 2 % Commonly known as: BACTROBAN Apply topically  3 (three) times daily.  Indication: Skin Infection due to a Bacteria   naloxegol oxalate 12.5 MG Tabs tablet Commonly known as: MOVANTIK Take 1 tablet (12.5 mg total) by mouth daily.  Indication: Opioid-Induced Constipation   neomycin-bacitracin-polymyxin Oint Commonly known as: NEOSPORIN Apply 1 application topically 3 (three) times daily.  Indication: Skin Infection   ondansetron 8 MG disintegrating tablet Commonly known as: ZOFRAN-ODT Take 1 tablet (8 mg total) by mouth every 12 (twelve) hours as needed for nausea or vomiting.  Indication: Nausea and Vomiting   polyethylene glycol 17 g packet Commonly known as: MIRALAX / GLYCOLAX Take 17 g by mouth daily as needed for moderate constipation.  Indication: Constipation   potassium chloride 10 MEQ tablet Commonly known as: KLOR-CON Take 1 tablet (10 mEq total) by mouth daily as needed. When taking lasix What changed:   when to take this  reasons to take this  additional instructions  Indication: Low Amount of Potassium in the Blood   pregabalin 50 MG capsule Commonly known as: LYRICA Take 1 capsule (50 mg total) by mouth 3 (three) times daily.  Indication: Generalized Anxiety Disorder, Neuropathic Pain   Spiriva HandiHaler 18 MCG inhalation capsule Generic drug: tiotropium INHALE 1 CAPSULE BY MOUTHTDAILY.M  Indication: Chronic Obstructive Lung Disease   Suboxone 4-1 MG Film Generic drug: Buprenorphine HCl-Naloxone HCl Place 1 Film under the tongue in the morning and at bedtime.  Indication: Opioid Dependence   tamsulosin 0.4 MG Caps capsule Commonly known as: FLOMAX Take 1 capsule (0.4 mg total) by mouth daily. Start taking on: February 02, 2020 What changed:   when to take this  reasons to take this  Indication: Dysfunction of the Urinary Bladder   traZODone 150 MG tablet Commonly known as: DESYREL Take 1 tablet (150 mg total) by  mouth at bedtime. What changed:   medication strength  how much to  take  Indication: [place] Follow up on 02/21/2020.   Why: You have an assessment appointment for medication management on 02/21/20 on 1:30 pm,  This will be a Virtual appointment.  Contact information: [place] [address] [phone]              Follow-up recommendations:   Activity:  Ad lib. Diet:  As tolerated Other:  Ensure patient has follow-up with her Suboxone treatment team after discharge   Comments: Patient's significant other is provided with instructions at time of discharge to include medications.  Patient and significant other are able to contract for safety and will return to nearest emergency department, crisis center or call 911 for worsening symptoms of depression, suicidal or homicidal thoughts, AVH or worsening delusions.  Signed: Lavella Hammock, MD 02/01/2020, 2:22 PM

## 2020-02-25 ENCOUNTER — Other Ambulatory Visit (INDEPENDENT_AMBULATORY_CARE_PROVIDER_SITE_OTHER): Payer: Self-pay | Admitting: Gastroenterology

## 2020-02-25 DIAGNOSIS — K5903 Drug induced constipation: Secondary | ICD-10-CM

## 2020-02-25 DIAGNOSIS — R112 Nausea with vomiting, unspecified: Secondary | ICD-10-CM
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
## 2020-03-17 ENCOUNTER — Encounter: Payer: Self-pay | Admitting: Family Medicine

## 2020-03-17 ENCOUNTER — Ambulatory Visit (INDEPENDENT_AMBULATORY_CARE_PROVIDER_SITE_OTHER): Payer: Medicaid Other | Admitting: Family Medicine

## 2020-03-17 ENCOUNTER — Other Ambulatory Visit: Payer: Self-pay | Admitting: Cardiology

## 2020-03-17 ENCOUNTER — Telehealth: Payer: Self-pay | Admitting: Family Medicine

## 2020-03-17 VITALS — BP 150/82 | HR 76 | Ht 61.0 in | Wt 168.0 lb

## 2020-03-17 DIAGNOSIS — R600 Localized edema: Secondary | ICD-10-CM | POA: Diagnosis not present

## 2020-03-17 DIAGNOSIS — I739 Peripheral vascular disease, unspecified: Secondary | ICD-10-CM

## 2020-03-17 DIAGNOSIS — Z720 Tobacco use: Secondary | ICD-10-CM

## 2020-03-17 DIAGNOSIS — R0600 Dyspnea, unspecified: Secondary | ICD-10-CM | POA: Diagnosis not present

## 2020-03-17 DIAGNOSIS — R0609 Other forms of dyspnea: Secondary | ICD-10-CM

## 2020-03-17 MED ORDER — TORSEMIDE 10 MG PO TABS: 10.0000 mg | ORAL_TABLET | Freq: Every day | ORAL | 3 refills | Status: DC

## 2020-03-17 NOTE — Patient Instructions (Signed)
Your physician recommends that you schedule a follow-up appointment in: [place], NP  Your physician has recommended you make the following change in your medication:   CHANGE TORSEMIDE 10 MG DAILY   Your physician recommends that you return for lab work BMP/MG  Your physician has requested that you have an echocardiogram. Echocardiography is a painless test that uses sound waves to create images of your heart. It provides your doctor with information about the size and shape of your heart and how well your heart's chambers and valves are working. This procedure takes approximately one hour. There are no restrictions for this procedure.   Your physician has requested that you have a lower extremity arterial duplex. This test is an ultrasound of the arteries in the legs or arms. It looks at arterial blood flow in the legs and arms. Allow one hour for Lower and Upper Arterial scans. There are no restrictions or special instructions  Your physician has requested that you have an ankle brachial index (ABI). During this test an ultrasound and blood pressure cuff are used to evaluate the arteries that supply the arms and legs with blood. Allow thirty minutes for this exam. There are no restrictions or special instructions. Thank you for choosing [facility]!!

## 2020-03-17 NOTE — Telephone Encounter (Signed)
Pre-cert Verification for the following procedure    ECHO US LOWER EXT ART  US ARTERIAL SEG   DATE: 03/23/2020  LOCATION: [facility]

## 2020-03-18 LAB — MAGNESIUM: Magnesium: 1.9 mg/dL (ref 1.6–2.3)

## 2020-03-18 LAB — BASIC METABOLIC PANEL
BUN/Creatinine Ratio: 19 (ref 9–23)
BUN: 17 mg/dL (ref 6–24)
CO2: 20 mmol/L (ref 20–29)
Calcium: 8.9 mg/dL (ref 8.7–10.2)
Chloride: 103 mmol/L (ref 96–106)
Creatinine, Ser: 0.88 mg/dL (ref 0.57–1.00)
GFR calc Af Amer: 87 mL/min/{1.73_m2} (ref >59)
GFR calc non Af Amer: 75 mL/min/{1.73_m2} (ref >59)
Glucose: 84 mg/dL (ref 65–99)
Potassium: 4.7 mmol/L (ref 3.5–5.2)
Sodium: 139 mmol/L (ref 134–144)

## 2020-03-18 LAB — SPECIMEN STATUS REPORT

## 2020-03-19 ENCOUNTER — Telehealth: Payer: Self-pay | Admitting: *Deleted

## 2020-03-19 NOTE — Telephone Encounter (Signed)
-----   Message from [name]., NP sent at 03/18/2020 10:41 AM EST ----- Her labs look good.

## 2020-03-23 ENCOUNTER — Ambulatory Visit (HOSPITAL_COMMUNITY): Admission: RE | Admit: 2020-03-23 | Payer: Medicaid Other | Source: Ambulatory Visit

## 2020-03-23 ENCOUNTER — Ambulatory Visit (HOSPITAL_BASED_OUTPATIENT_CLINIC_OR_DEPARTMENT_OTHER)
Admission: RE | Admit: 2020-03-23 | Discharge: 2020-03-23 | Disposition: A | Discharge status: Home / Self Care | Payer: Medicaid Other | Source: Ambulatory Visit | Attending: Family Medicine | Admitting: Family Medicine

## 2020-03-23 ENCOUNTER — Ambulatory Visit (HOSPITAL_COMMUNITY)
Admission: RE | Admit: 2020-03-23 | Discharge: 2020-03-23 | Disposition: A | Discharge status: Home / Self Care | Payer: Medicaid Other | Source: Ambulatory Visit | Attending: Family Medicine | Admitting: Family Medicine

## 2020-03-23 ENCOUNTER — Other Ambulatory Visit: Payer: Self-pay

## 2020-03-23 DIAGNOSIS — R0600 Dyspnea, unspecified: Secondary | ICD-10-CM | POA: Insufficient documentation

## 2020-03-23 DIAGNOSIS — I739 Peripheral vascular disease, unspecified: Secondary | ICD-10-CM | POA: Diagnosis not present

## 2020-03-23 DIAGNOSIS — R0609 Other forms of dyspnea: Secondary | ICD-10-CM

## 2020-03-23 LAB — ECHOCARDIOGRAM COMPLETE
AR max vel: 2.15 cm2
AV Area VTI: 2.07 cm2
AV Area mean vel: 2.13 cm2
AV Mean grad: 6.3 mmHg
AV Peak grad: 12.5 mmHg
Ao pk vel: 1.77 m/s
Area-P 1/2: 2.91 cm2
S' Lateral: 2.2 cm

## 2020-03-23 NOTE — Telephone Encounter (Signed)
-----   Message from [name]., NP sent at 03/23/2020 11:03 AM EST ----- Please call the patient and let her know the pumping function of her heart looks good.  Has a very mildly leaking mitral valve.  This is of no clinical significance.  Echo looked good.  Nothing on the report that would contribute to shortness of breath or dyspnea on exertion.

## 2020-03-23 NOTE — Telephone Encounter (Signed)
Patient informed. Copy sent to PCP °

## 2020-03-23 NOTE — Progress Notes (Signed)
*  PRELIMINARY RESULTS* Echocardiogram 2D Echocardiogram has been performed.  Meredith [name] 03/23/2020, 10:25 AM

## 2020-03-24 ENCOUNTER — Telehealth: Payer: Self-pay | Admitting: *Deleted

## 2020-03-24 NOTE — Telephone Encounter (Signed)
-----   Message from [name]., NP sent at 03/23/2020  4:29 PM EST ----- Please call the patient and let her know the lower extremity arterial duplex study/ABIs were normal.  Nothing on the study to suggest peripheral arterial disease as a cause of her leg pain.  Thank you

## 2020-03-24 NOTE — Telephone Encounter (Signed)
Patient informed. Copy sent to PCP °

## 2020-03-25 ENCOUNTER — Other Ambulatory Visit (HOSPITAL_COMMUNITY): Payer: Self-pay | Admitting: Family Medicine

## 2020-03-25 DIAGNOSIS — Z1231 Encounter for screening mammogram for malignant neoplasm of breast: Secondary | ICD-10-CM

## 2020-04-15 ENCOUNTER — Other Ambulatory Visit: Payer: Medicaid Other

## 2020-04-21 ENCOUNTER — Ambulatory Visit: Payer: Medicaid Other | Admitting: Family Medicine

## 2020-05-06 ENCOUNTER — Encounter: Payer: Self-pay | Admitting: Urology

## 2020-05-06 ENCOUNTER — Ambulatory Visit (INDEPENDENT_AMBULATORY_CARE_PROVIDER_SITE_OTHER): Payer: Medicaid Other | Admitting: Urology

## 2020-05-06 ENCOUNTER — Other Ambulatory Visit: Payer: Self-pay

## 2020-05-06 VITALS — BP 136/79 | HR 84 | Temp 97.8°F | Ht 61.0 in | Wt 160.0 lb

## 2020-05-06 DIAGNOSIS — N200 Calculus of kidney: Secondary | ICD-10-CM

## 2020-05-06 DIAGNOSIS — N3000 Acute cystitis without hematuria: Secondary | ICD-10-CM | POA: Insufficient documentation

## 2020-05-06 LAB — URINALYSIS, ROUTINE W REFLEX MICROSCOPIC
Bilirubin, UA: NEGATIVE
Glucose, UA: NEGATIVE
Leukocytes,UA: NEGATIVE
Nitrite, UA: NEGATIVE
Specific Gravity, UA: >1.03 — ABNORMAL HIGH (ref 1.005–1.030)
Urobilinogen, Ur: 0.2 mg/dL (ref 0.2–1.0)
pH, UA: 5 (ref 5.0–7.5)

## 2020-05-06 LAB — MICROSCOPIC EXAMINATION
RBC, Urine: NONE SEEN /hpf (ref 0–2)
Renal Epithel, UA: NONE SEEN /hpf

## 2020-05-06 MED ORDER — NITROFURANTOIN MONOHYD MACRO 100 MG PO CAPS: 100.0000 mg | ORAL_CAPSULE | Freq: Two times a day (BID) | ORAL | 0 refills | Status: DC

## 2020-05-06 MED ORDER — TAMSULOSIN HCL 0.4 MG PO CAPS: 0.4000 mg | ORAL_CAPSULE | Freq: Every day | ORAL | 11 refills | Status: DC

## 2020-05-06 MED ORDER — NITROFURANTOIN MACROCRYSTAL 50 MG PO CAPS: 50.0000 mg | ORAL_CAPSULE | Freq: Every day | ORAL | 3 refills | Status: DC
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
## 2020-05-06 NOTE — Patient Instructions (Signed)

## 2020-05-06 NOTE — Progress Notes (Signed)
Urological Symptom Review  Patient is experiencing the following symptoms: Stream starts and stops   Review of Systems  Gastrointestinal (upper)  : Nausea Vomiting Indigestion/heartburn  Gastrointestinal (lower) : Constipation  Constitutional : Fatigue  Skin: Negative for skin symptoms  Eyes: Negative for eye symptoms  Ear/Nose/Throat : Sinus problems  Hematologic/Lymphatic: Easy bruising  Cardiovascular : Negative for cardiovascular symptoms  Respiratory : Shortness of breath  Endocrine: Excessive thirst  Musculoskeletal: Back pain Joint pain  Neurological: Headaches Dizziness  Psychologic: Negative for psychiatric symptoms

## 2020-05-08 LAB — URINE CULTURE

## 2020-05-11 ENCOUNTER — Telehealth: Payer: Self-pay

## 2020-05-11 NOTE — Telephone Encounter (Signed)
Attempted to call pt. Mailbox full. Unable to leave message.

## 2020-05-11 NOTE — Telephone Encounter (Signed)
-----   Message from Patrick L McKenzie, MD sent at 05/11/2020  9:49 AM EST ----- negative ----- Message ----- From: Rease Swinson, LPN Sent: 05/08/2020  10:47 AM EST To: Patrick L McKenzie, MD  Pls review.  

## 2020-05-16 ENCOUNTER — Other Ambulatory Visit: Payer: Self-pay

## 2020-05-16 ENCOUNTER — Encounter (HOSPITAL_COMMUNITY): Payer: Self-pay | Admitting: Emergency Medicine

## 2020-05-16 ENCOUNTER — Ambulatory Visit (HOSPITAL_COMMUNITY)
Admission: AD | Admit: 2020-05-16 | Discharge: 2020-05-16 | Disposition: A | Discharge status: Home / Self Care | Payer: Medicaid Other | Attending: Psychiatry | Admitting: Psychiatry

## 2020-05-16 ENCOUNTER — Emergency Department (HOSPITAL_COMMUNITY)
Admission: EM | Admit: 2020-05-16 | Discharge: 2020-05-18 | Disposition: A | Discharge status: Other Acute Inpatient Hospital | Payer: Medicaid Other | Attending: Emergency Medicine | Admitting: Emergency Medicine

## 2020-05-16 DIAGNOSIS — J449 Chronic obstructive pulmonary disease, unspecified: Secondary | ICD-10-CM | POA: Diagnosis not present

## 2020-05-16 DIAGNOSIS — Z1330 Encounter for screening examination for mental health and behavioral disorders, unspecified: Secondary | ICD-10-CM | POA: Diagnosis present

## 2020-05-16 DIAGNOSIS — F259 Schizoaffective disorder, unspecified: Secondary | ICD-10-CM | POA: Diagnosis present

## 2020-05-16 DIAGNOSIS — Z79899 Other long term (current) drug therapy: Secondary | ICD-10-CM | POA: Diagnosis not present

## 2020-05-16 DIAGNOSIS — F17210 Nicotine dependence, cigarettes, uncomplicated: Secondary | ICD-10-CM | POA: Diagnosis not present

## 2020-05-16 DIAGNOSIS — F319 Bipolar disorder, unspecified: Secondary | ICD-10-CM | POA: Diagnosis present

## 2020-05-16 DIAGNOSIS — M797 Fibromyalgia: Secondary | ICD-10-CM | POA: Insufficient documentation

## 2020-05-16 DIAGNOSIS — F250 Schizoaffective disorder, bipolar type: Secondary | ICD-10-CM | POA: Diagnosis present

## 2020-05-16 DIAGNOSIS — F22 Delusional disorders: Secondary | ICD-10-CM

## 2020-05-16 DIAGNOSIS — J45909 Unspecified asthma, uncomplicated: Secondary | ICD-10-CM | POA: Diagnosis not present

## 2020-05-16 DIAGNOSIS — E039 Hypothyroidism, unspecified: Secondary | ICD-10-CM | POA: Diagnosis not present

## 2020-05-16 DIAGNOSIS — Z20822 Contact with and (suspected) exposure to covid-19: Secondary | ICD-10-CM | POA: Insufficient documentation

## 2020-05-16 DIAGNOSIS — F411 Generalized anxiety disorder: Secondary | ICD-10-CM | POA: Diagnosis present

## 2020-05-16 LAB — URINALYSIS, ROUTINE W REFLEX MICROSCOPIC
Bilirubin Urine: NEGATIVE
Glucose, UA: NEGATIVE mg/dL
Ketones, ur: 20 mg/dL — AB
Leukocytes,Ua: NEGATIVE
Nitrite: NEGATIVE
Protein, ur: 30 mg/dL — AB
Specific Gravity, Urine: 1.031 — ABNORMAL HIGH (ref 1.005–1.030)
pH: 5 (ref 5.0–8.0)

## 2020-05-16 LAB — CBC WITH DIFFERENTIAL/PLATELET
Abs Immature Granulocytes: 0.02 10*3/uL (ref 0.00–0.07)
Basophils Absolute: 0.1 10*3/uL (ref 0.0–0.1)
Basophils Relative: 1 %
Eosinophils Absolute: 0.1 10*3/uL (ref 0.0–0.5)
Eosinophils Relative: 2 %
HCT: 53.3 % — ABNORMAL HIGH (ref 36.0–46.0)
Hemoglobin: 17.8 g/dL — ABNORMAL HIGH (ref 12.0–15.0)
Immature Granulocytes: 0 %
Lymphocytes Relative: 31 %
Lymphs Abs: 2.8 10*3/uL (ref 0.7–4.0)
MCH: 30.7 pg (ref 26.0–34.0)
MCHC: 33.4 g/dL (ref 30.0–36.0)
MCV: 92.1 fL (ref 80.0–100.0)
Monocytes Absolute: 0.9 10*3/uL (ref 0.1–1.0)
Monocytes Relative: 10 %
Neutro Abs: 5.1 10*3/uL (ref 1.7–7.7)
Neutrophils Relative %: 56 %
Platelets: 299 10*3/uL (ref 150–400)
RBC: 5.79 MIL/uL — ABNORMAL HIGH (ref 3.87–5.11)
RDW: 17.8 % — ABNORMAL HIGH (ref 11.5–15.5)
WBC: 9.1 10*3/uL (ref 4.0–10.5)
nRBC: 0 % (ref 0.0–0.2)

## 2020-05-16 LAB — ETHANOL: Alcohol, Ethyl (B): <10 mg/dL (ref <10)

## 2020-05-16 LAB — COMPREHENSIVE METABOLIC PANEL
ALT: 18 U/L (ref 0–44)
AST: 22 U/L (ref 15–41)
Albumin: 4.4 g/dL (ref 3.5–5.0)
Alkaline Phosphatase: 100 U/L (ref 38–126)
Anion gap: 13 (ref 5–15)
BUN: 24 mg/dL — ABNORMAL HIGH (ref 6–20)
CO2: 25 mmol/L (ref 22–32)
Calcium: 9.5 mg/dL (ref 8.9–10.3)
Chloride: 104 mmol/L (ref 98–111)
Creatinine, Ser: 0.78 mg/dL (ref 0.44–1.00)
GFR, Estimated: >60 mL/min (ref >60)
Glucose, Bld: 87 mg/dL (ref 70–99)
Potassium: 3.4 mmol/L — ABNORMAL LOW (ref 3.5–5.1)
Sodium: 142 mmol/L (ref 135–145)
Total Bilirubin: 0.8 mg/dL (ref 0.3–1.2)
Total Protein: 7.8 g/dL (ref 6.5–8.1)

## 2020-05-16 LAB — TROPONIN I (HIGH SENSITIVITY)
Troponin I (High Sensitivity): 4 ng/L (ref <18)
Troponin I (High Sensitivity): 3 ng/L (ref <18)

## 2020-05-16 LAB — RESP PANEL BY RT-PCR (FLU A&B, COVID) ARPGX2
Influenza A by PCR: NEGATIVE
Influenza B by PCR: NEGATIVE
SARS Coronavirus 2 by RT PCR: NEGATIVE

## 2020-05-16 LAB — SALICYLATE LEVEL: Salicylate Lvl: <7 mg/dL — ABNORMAL LOW (ref 7.0–30.0)

## 2020-05-16 LAB — ACETAMINOPHEN LEVEL: Acetaminophen (Tylenol), Serum: <10 ug/mL — ABNORMAL LOW (ref 10–30)

## 2020-05-16 MED ORDER — ONDANSETRON 8 MG PO TBDP
8.0000 mg | ORAL_TABLET | Freq: Once | ORAL | Status: AC
  Administered 2020-05-16: 8 mg via ORAL
  Filled 2020-05-16: qty 1

## 2020-05-16 MED ORDER — LORAZEPAM 2 MG/ML IJ SOLN: 1.0000 mg | Freq: Once | INTRAMUSCULAR | Status: DC

## 2020-05-16 NOTE — BH Assessment (Incomplete)
Pt who presents voluntarily to [facility] and accompanied by Ronnette Juniper, boyfriend [phone], who participated in reassessment at Pt's request.  Pt boyfriend acknowledged that she is not sleeping, and she still is not eating.  Pt reporters that "nobody wants me well,  I am been made sick,someone's is taking my medication and fucking with my disability".    Pt denies suicidal ideation.  Pt denies homicidal ideation.  Pt denies history of  intentioanl self injurious behaviors.  Pt denies epsoids of paranoid, she states "nobody believes me, somebody is taking my medication".   Pt denies history of auditory or visual hallucinations, her boyfriend states that she is hearing voices.  Pt reports that she has no where to live in 12 years, "it so bad, you would want me to stay in the hospital". Pt reports "I have been off my medication, I need for your to let me go over to [facility] and give me my medication, your are sticking needles and taking my medication".  Pt boyfriend reports she is not currently taking any medication, "transdone and ludtidue, is not working, she is not able to sleep".  Pt is alert, with loud speech and restless motor behavior.  Eye contact is normal and mood is anxious and affect is anxious.  Thought process is distorted.  Pt insight is  Poor and judgment is poor.  There appears that pt is currently responding to internal stimuli or experiencing delusional thought content.  Pt was   Disposition: Margorie John PA-c, patient meets in patient criteria.  No beds avialalbe at [facility].  Pt wil remain  In ED and soical work wil follow

## 2020-05-16 NOTE — BH Assessment (Addendum)
Pt who presents voluntatily to [facility] and accompanied by Ronnette Juniper, boyfriend [phone], who participated in reassessment at Pt's request.  Pt boyfriend acknowledged that she is not sleeping, and she still is not eating.  Pt reporters that "nobody wants me well,  I am being made sick,someone's is taking my medication and fucking with my disability".    Pt denies suicidal ideation.  Pt denies homicidal ideation.  Pt denies history of  intentioanl self injurious behaviors.  Pt denies episods of paranoid, she states "nobody believes me, somebody is taking my medication".   Pt denies history of auditory or visual hallucinations, her boyfriend states that she is hearing voices.  Pt reports that she have no where to live in 12 years, "it so bad, you would, want me to stay in the hospital". Pt reports "I have been off my medication, I need for your to let me go over to [facility] and give me my medication, your are sticking needles and taking my medication".  Pt boyfriend reports she is not currently taking any medication, "trazodone is not working, she is not able to sleep".  Pt is alert, with loud speech and restless motor behavior.  Eye contact is normal and mood is anxious and affect is anxious.  Thought process is distorted.  Pt insight is poor and judgment is poor.  It appears that pt is currently responding to internal stimuli and experiencing delusional thought content.  Pt was agitated throughout the  reassessment.  Disposition: Margorie John PA-C, patient meets in patient criteria. AC reports that patient will be accepted, pending discharges tomorrow.  The morning AC will coordinates.
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
## 2020-05-16 NOTE — BH Assessment (Signed)
TTS spoke with nurse secretary, have agreed to contact [facility], to put pt in a private room to carryout reassessment.

## 2020-05-16 NOTE — ED Notes (Signed)
TTS is in with patient now
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
## 2020-05-16 NOTE — ED Notes (Addendum)
Patient states she is nauseated and requests something to eat. She states she hasn't ate in 5 days. Pt also states that she has chest pain now after we took the EKG and left the leads on. PA notified

## 2020-05-17 LAB — RAPID URINE DRUG SCREEN, HOSP PERFORMED
Amphetamines: NOT DETECTED
Barbiturates: NOT DETECTED
Benzodiazepines: NOT DETECTED
Cocaine: NOT DETECTED
Opiates: NOT DETECTED
Tetrahydrocannabinol: NOT DETECTED

## 2020-05-17 MED ORDER — LURASIDONE HCL 40 MG PO TABS
40.0000 mg | ORAL_TABLET | Freq: Every day | ORAL | Status: DC
  Administered 2020-05-18: 40 mg via ORAL
  Filled 2020-05-17 (×2): qty 1

## 2020-05-17 MED ORDER — HYDROXYZINE HCL 25 MG PO TABS
25.0000 mg | ORAL_TABLET | Freq: Three times a day (TID) | ORAL | Status: DC | PRN
  Administered 2020-05-18: 25 mg via ORAL
  Filled 2020-05-17: qty 1

## 2020-05-17 MED ORDER — TRAZODONE HCL 100 MG PO TABS
50.0000 mg | ORAL_TABLET | Freq: Every evening | ORAL | Status: DC | PRN
  Administered 2020-05-18: 50 mg via ORAL
  Filled 2020-05-17: qty 1

## 2020-05-17 NOTE — ED Notes (Signed)
Patient has continued to take off BP cuff and pulse ox all shift. RN has repeatedly asked patient to leave on while in the emergency department as we need to monitor vital signs.RN walked into patient's and both were taken off again.

## 2020-05-17 NOTE — ED Notes (Addendum)
Patient transferred to [facility] from [facility]. Patient still in street clothes and IV in right arm.

## 2020-05-17 NOTE — ED Notes (Signed)
Report called to [facility]. Pt and her belongings will be moved to TCU.

## 2020-05-17 NOTE — BH Assessment (Addendum)
Per Merlyn Lot, NP, patient meets criteria for INPT treatment. [facility] AC Jerene Pitch, RN), notified of bed needs. States that patient may have bed placement at [facility] later today. AC will provide updates to Disposition staff as the day progresses.

## 2020-05-17 NOTE — ED Notes (Signed)
I gave patient a ginger ale and crackers and peanut butter per nurse

## 2020-05-17 NOTE — ED Notes (Signed)
Asked patient to dress out in brugundey scrubs. Patient is cooperative.

## 2020-05-18 ENCOUNTER — Inpatient Hospital Stay
Admission: RE | Admit: 2020-05-18 | Discharge: 2020-05-29 | DRG: 885 | Disposition: A | Discharge status: Home / Self Care | Payer: Medicaid Other | Source: Intra-hospital | Attending: Behavioral Health | Admitting: Behavioral Health

## 2020-05-18 ENCOUNTER — Other Ambulatory Visit: Payer: Self-pay

## 2020-05-18 ENCOUNTER — Encounter: Payer: Self-pay | Admitting: Behavioral Health

## 2020-05-18 ENCOUNTER — Encounter (HOSPITAL_COMMUNITY): Payer: Self-pay | Admitting: Registered Nurse

## 2020-05-18 DIAGNOSIS — J449 Chronic obstructive pulmonary disease, unspecified: Secondary | ICD-10-CM | POA: Diagnosis not present

## 2020-05-18 DIAGNOSIS — Z79890 Hormone replacement therapy (postmenopausal): Secondary | ICD-10-CM

## 2020-05-18 DIAGNOSIS — Z9151 Personal history of suicidal behavior: Secondary | ICD-10-CM

## 2020-05-18 DIAGNOSIS — Z9114 Patient's other noncompliance with medication regimen: Secondary | ICD-10-CM

## 2020-05-18 DIAGNOSIS — M797 Fibromyalgia: Secondary | ICD-10-CM | POA: Diagnosis present

## 2020-05-18 DIAGNOSIS — Z8249 Family history of ischemic heart disease and other diseases of the circulatory system: Secondary | ICD-10-CM

## 2020-05-18 DIAGNOSIS — Z79899 Other long term (current) drug therapy: Secondary | ICD-10-CM | POA: Diagnosis not present

## 2020-05-18 DIAGNOSIS — E039 Hypothyroidism, unspecified: Secondary | ICD-10-CM | POA: Diagnosis present

## 2020-05-18 DIAGNOSIS — Z85828 Personal history of other malignant neoplasm of skin: Secondary | ICD-10-CM

## 2020-05-18 DIAGNOSIS — B9689 Other specified bacterial agents as the cause of diseases classified elsewhere: Secondary | ICD-10-CM | POA: Diagnosis not present

## 2020-05-18 DIAGNOSIS — K219 Gastro-esophageal reflux disease without esophagitis: Secondary | ICD-10-CM | POA: Diagnosis present

## 2020-05-18 DIAGNOSIS — N3000 Acute cystitis without hematuria: Secondary | ICD-10-CM | POA: Diagnosis not present

## 2020-05-18 DIAGNOSIS — F411 Generalized anxiety disorder: Secondary | ICD-10-CM | POA: Diagnosis present

## 2020-05-18 DIAGNOSIS — F250 Schizoaffective disorder, bipolar type: Principal | ICD-10-CM | POA: Diagnosis present

## 2020-05-18 DIAGNOSIS — Z825 Family history of asthma and other chronic lower respiratory diseases: Secondary | ICD-10-CM

## 2020-05-18 DIAGNOSIS — F259 Schizoaffective disorder, unspecified: Secondary | ICD-10-CM | POA: Diagnosis not present

## 2020-05-18 DIAGNOSIS — F319 Bipolar disorder, unspecified: Secondary | ICD-10-CM | POA: Diagnosis present

## 2020-05-18 DIAGNOSIS — F17200 Nicotine dependence, unspecified, uncomplicated: Secondary | ICD-10-CM | POA: Diagnosis present

## 2020-05-18 DIAGNOSIS — B353 Tinea pedis: Secondary | ICD-10-CM | POA: Diagnosis present

## 2020-05-18 DIAGNOSIS — Z823 Family history of stroke: Secondary | ICD-10-CM

## 2020-05-18 DIAGNOSIS — Z8261 Family history of arthritis: Secondary | ICD-10-CM | POA: Diagnosis not present

## 2020-05-18 DIAGNOSIS — T7840XA Allergy, unspecified, initial encounter: Secondary | ICD-10-CM | POA: Diagnosis not present

## 2020-05-18 DIAGNOSIS — Z801 Family history of malignant neoplasm of trachea, bronchus and lung: Secondary | ICD-10-CM

## 2020-05-18 DIAGNOSIS — Z720 Tobacco use: Secondary | ICD-10-CM | POA: Diagnosis present

## 2020-05-18 DIAGNOSIS — F1120 Opioid dependence, uncomplicated: Secondary | ICD-10-CM | POA: Diagnosis present

## 2020-05-18 DIAGNOSIS — N390 Urinary tract infection, site not specified: Secondary | ICD-10-CM | POA: Diagnosis not present

## 2020-05-18 DIAGNOSIS — J45909 Unspecified asthma, uncomplicated: Secondary | ICD-10-CM | POA: Diagnosis present

## 2020-05-18 DIAGNOSIS — R5382 Chronic fatigue, unspecified: Secondary | ICD-10-CM | POA: Diagnosis present

## 2020-05-18 DIAGNOSIS — G4700 Insomnia, unspecified: Secondary | ICD-10-CM | POA: Diagnosis present

## 2020-05-18 LAB — POC SARS CORONAVIRUS 2 AG -  ED: SARS Coronavirus 2 Ag: NEGATIVE

## 2020-05-18 MED ORDER — ALBUTEROL SULFATE HFA 108 (90 BASE) MCG/ACT IN AERS
1.0000 | INHALATION_SPRAY | Freq: Four times a day (QID) | RESPIRATORY_TRACT | Status: DC
  Filled 2020-05-18: qty 6.7

## 2020-05-18 MED ORDER — MAGNESIUM HYDROXIDE 400 MG/5ML PO SUSP
30.0000 mL | Freq: Every day | ORAL | Status: DC | PRN
  Administered 2020-05-29: 30 mL via ORAL

## 2020-05-18 MED ORDER — HYDROXYZINE HCL 25 MG PO TABS
25.0000 mg | ORAL_TABLET | Freq: Three times a day (TID) | ORAL | Status: DC | PRN
  Administered 2020-05-19 – 2020-05-25 (×10): 25 mg via ORAL
  Filled 2020-05-18 (×11): qty 1

## 2020-05-18 MED ORDER — TORSEMIDE 10 MG PO TABS
10.0000 mg | ORAL_TABLET | Freq: Every day | ORAL | Status: DC
  Administered 2020-05-19 – 2020-05-21 (×3): 10 mg via ORAL
  Filled 2020-05-18 (×4): qty 1

## 2020-05-18 MED ORDER — PANTOPRAZOLE SODIUM 40 MG PO TBEC
40.0000 mg | DELAYED_RELEASE_TABLET | Freq: Every day | ORAL | Status: DC
  Administered 2020-05-19 – 2020-05-29 (×11): 40 mg via ORAL
  Filled 2020-05-18 (×11): qty 1

## 2020-05-18 MED ORDER — IBUPROFEN 600 MG PO TABS
600.0000 mg | ORAL_TABLET | Freq: Four times a day (QID) | ORAL | Status: DC | PRN
  Administered 2020-05-19 – 2020-05-26 (×14): 600 mg via ORAL
  Filled 2020-05-18 (×14): qty 1

## 2020-05-18 MED ORDER — POLYETHYLENE GLYCOL 3350 17 G PO PACK: 17.0000 g | PACK | Freq: Every day | ORAL | Status: DC | PRN

## 2020-05-18 MED ORDER — ALUM & MAG HYDROXIDE-SIMETH 200-200-20 MG/5ML PO SUSP
30.0000 mL | ORAL | Status: DC | PRN
  Administered 2020-05-18 – 2020-05-19 (×2): 30 mL via ORAL
  Filled 2020-05-18 (×2): qty 30

## 2020-05-18 MED ORDER — ALBUTEROL SULFATE HFA 108 (90 BASE) MCG/ACT IN AERS
1.0000 | INHALATION_SPRAY | Freq: Four times a day (QID) | RESPIRATORY_TRACT | Status: DC
  Administered 2020-05-19 – 2020-05-27 (×21): 1 via RESPIRATORY_TRACT
  Filled 2020-05-18: qty 6.7

## 2020-05-18 MED ORDER — TORSEMIDE 10 MG PO TABS
10.0000 mg | ORAL_TABLET | Freq: Every day | ORAL | Status: DC
  Administered 2020-05-18: 10 mg via ORAL
  Filled 2020-05-18: qty 1

## 2020-05-18 MED ORDER — TRAZODONE HCL 50 MG PO TABS: 50.0000 mg | ORAL_TABLET | Freq: Every evening | ORAL | Status: DC | PRN

## 2020-05-18 MED ORDER — ACETAMINOPHEN 325 MG PO TABS: 650.0000 mg | ORAL_TABLET | Freq: Four times a day (QID) | ORAL | Status: DC | PRN

## 2020-05-18 MED ORDER — MONTELUKAST SODIUM 10 MG PO TABS
10.0000 mg | ORAL_TABLET | Freq: Every day | ORAL | Status: DC
  Filled 2020-05-18: qty 1

## 2020-05-18 MED ORDER — IBUPROFEN 200 MG PO TABS
600.0000 mg | ORAL_TABLET | Freq: Four times a day (QID) | ORAL | Status: DC | PRN
  Administered 2020-05-18: 600 mg via ORAL
  Filled 2020-05-18: qty 3

## 2020-05-18 MED ORDER — LEVOTHYROXINE SODIUM 25 MCG PO TABS
25.0000 ug | ORAL_TABLET | Freq: Every day | ORAL | Status: DC
  Administered 2020-05-18: 25 ug via ORAL
  Filled 2020-05-18: qty 1

## 2020-05-18 MED ORDER — ACETAMINOPHEN 325 MG PO TABS
650.0000 mg | ORAL_TABLET | Freq: Four times a day (QID) | ORAL | Status: DC | PRN
  Administered 2020-05-20 – 2020-05-29 (×6): 650 mg via ORAL
  Filled 2020-05-18 (×6): qty 2

## 2020-05-18 MED ORDER — TRAZODONE HCL 50 MG PO TABS
50.0000 mg | ORAL_TABLET | Freq: Every evening | ORAL | Status: DC | PRN
  Administered 2020-05-18 – 2020-05-24 (×7): 50 mg via ORAL
  Filled 2020-05-18 (×7): qty 1

## 2020-05-18 MED ORDER — MONTELUKAST SODIUM 10 MG PO TABS
10.0000 mg | ORAL_TABLET | Freq: Every day | ORAL | Status: AC
  Administered 2020-05-18 – 2020-05-27 (×10): 10 mg via ORAL
  Filled 2020-05-18 (×10): qty 1

## 2020-05-18 MED ORDER — LEVOTHYROXINE SODIUM 50 MCG PO TABS
25.0000 ug | ORAL_TABLET | Freq: Every day | ORAL | Status: DC
  Administered 2020-05-19: 25 ug via ORAL
  Filled 2020-05-18: qty 1

## 2020-05-18 MED ORDER — BUPRENORPHINE HCL-NALOXONE HCL 8-2 MG SL SUBL
1.0000 | SUBLINGUAL_TABLET | Freq: Two times a day (BID) | SUBLINGUAL | Status: DC
  Administered 2020-05-18 – 2020-05-29 (×22): 1 via SUBLINGUAL
  Filled 2020-05-18 (×23): qty 1

## 2020-05-18 MED ORDER — LURASIDONE HCL 40 MG PO TABS
40.0000 mg | ORAL_TABLET | Freq: Every day | ORAL | Status: DC
  Administered 2020-05-19: 40 mg via ORAL
  Filled 2020-05-18: qty 1

## 2020-05-18 NOTE — ED Provider Notes (Signed)
Emergency Medicine Observation Re-evaluation Note  Meredith [name] is a 55 y.o. female, seen on rounds today.  Pt initially presented to the ED for complaints of Paranoid Currently, the patient is resting.  Physical Exam  BP (!) 149/100 (BP Location: Right Arm)   Pulse 77   Temp 98.7 F (37.1 C) (Oral)   Resp 18   SpO2 96%  Physical Exam General: in bed, answers to name Skin: dry Lungs: no respiratory distress, unlabored breathing Psych: currently calm and resting  ED Course / MDM  EKG:  Clinical Course as of 05/18/20 0846  Sat May 16, 2020  1658 Nursing staff apply leads to obtain an EKG.  Patient now complaining of chest pain.  Will obtain a troponin. [LJ]    Clinical Course User Index [LJ] Rayna Sexton, PA-C   I have reviewed the labs performed to date as well as medications administered while in observation.  Recent changes in the last 24 hours include awaiting [facility] placement.  Plan  Current plan is for [facility] placement.    Lorelle Gibbs, DO 05/18/20 [phone]

## 2020-05-18 NOTE — ED Notes (Signed)
Report given to [place] and safe transport called.

## 2020-05-18 NOTE — Consult Note (Signed)
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 admission when medically cleared.  This service was provided via telemedicine using a 2-way, interactive audio and video technology.  Names of all persons participating in this telemedicine service and their role in this encounter. Name: Earleen Newport Role: NP  Name: Dr. Hampton Abbot Role: Psychiatrist  Name: Jack Quarto Role: Patient  Name: Dr. Dina Rich Role: Dirk Dress EDP:  Sent a secure message informing:  Patient continues to meet criteria for inpatient psychiatric treatment    Earleen Newport, NP 05/18/2020 1:39 PM

## 2020-05-18 NOTE — BH Assessment (Signed)
Patient can arrive by 8pm   Patient has been accepted to [facility].  Accepting physician is Dr. Domingo Cocking.  Attending Physician will be Dr. Domingo Cocking.  Patient has been assigned to room 305, by [place].   Call report to [phone].  Representative/Transfer Coordinator is Ilda Foil Patient pre-admitted by [facility] Patient Access Elberta Fortis)  [facility] ER Staff Jola Babinski, [place] ) made aware of acceptance

## 2020-05-18 NOTE — ED Notes (Signed)
IV removed.

## 2020-05-18 NOTE — BH Assessment (Signed)
[facility] Assessment Progress Note  Per Shuvon Rankin, NP, this voluntary pt requires psychiatric hospitalization at this time.  Dr Domingo Cocking at [facility] has tentatively agreed to take pt pending new negative Covid-19 test results.  EDP Octaviano Glow, MD agrees to order.  Please contact [facility] (intake number: [phone]) once results have come back to finalize plan.  Pt will either need to sign Voluntary Consent for Admission and Treatment before she can be transports (via [facility]) or she will need to be placed under IVC if she meets criteria (and transported via [facility]).  Dr Langston Masker and pt's nurse, Eustaquio Maize, have been notified, as has Jola Babinski, Youth worker.  Jalene Mullet, [place] Coordinator [phone]

## 2020-05-19 DIAGNOSIS — F1120 Opioid dependence, uncomplicated: Secondary | ICD-10-CM | POA: Diagnosis present

## 2020-05-19 DIAGNOSIS — J45909 Unspecified asthma, uncomplicated: Secondary | ICD-10-CM | POA: Diagnosis present

## 2020-05-19 LAB — HEMOGLOBIN A1C
Hgb A1c MFr Bld: 5.4 % (ref 4.8–5.6)
Mean Plasma Glucose: 108 mg/dL

## 2020-05-19 LAB — LIPID PANEL
Cholesterol: 160 mg/dL (ref 0–200)
HDL: 39 mg/dL — ABNORMAL LOW (ref >40)
LDL Cholesterol: 98 mg/dL (ref 0–99)
Total CHOL/HDL Ratio: 4.1 RATIO
Triglycerides: 114 mg/dL (ref <150)
VLDL: 23 mg/dL (ref 0–40)

## 2020-05-19 MED ORDER — MIRTAZAPINE 15 MG PO TABS
7.5000 mg | ORAL_TABLET | Freq: Every day | ORAL | Status: DC
  Administered 2020-05-19 – 2020-05-28 (×10): 7.5 mg via ORAL
  Filled 2020-05-19 (×10): qty 1

## 2020-05-19 MED ORDER — ARIPIPRAZOLE 10 MG PO TABS
10.0000 mg | ORAL_TABLET | Freq: Every day | ORAL | Status: DC
  Administered 2020-05-19 – 2020-05-21 (×3): 10 mg via ORAL
  Filled 2020-05-19 (×3): qty 1

## 2020-05-19 MED ORDER — ONDANSETRON HCL 4 MG PO TABS
4.0000 mg | ORAL_TABLET | Freq: Three times a day (TID) | ORAL | Status: DC | PRN
  Administered 2020-05-19 – 2020-05-20 (×2): 4 mg via ORAL
  Filled 2020-05-19 (×2): qty 1

## 2020-05-19 MED ORDER — BUSPIRONE HCL 5 MG PO TABS
10.0000 mg | ORAL_TABLET | Freq: Two times a day (BID) | ORAL | Status: DC
  Administered 2020-05-19 – 2020-05-24 (×10): 10 mg via ORAL
  Filled 2020-05-19 (×10): qty 2

## 2020-05-19 NOTE — Progress Notes (Signed)
Pt is aware of Q15 minute rounding. Unit and room orientation complete. Two nurse body check completed.
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
## 2020-05-19 NOTE — [facility] Group Notes (Signed)
LCSW Group Therapy Note  05/19/2020 2:08 PM  Type of Therapy/Topic:  Group Therapy:  Feelings about Diagnosis  Participation Level:  None   Description of Group:   This group will allow patients to explore their thoughts and feelings about diagnoses they have received. Patients will be guided to explore their level of understanding and acceptance of these diagnoses. Facilitator will encourage patients to process their thoughts and feelings about the reactions of others to their diagnosis and will guide patients in identifying ways to discuss their diagnosis with significant others in their lives. This group will be process-oriented, with patients participating in exploration of their own experiences, giving and receiving support, and processing challenge from other group members.   Therapeutic Goals: 1. Patient will demonstrate understanding of diagnosis as evidenced by identifying two or more symptoms of the disorder 2. Patient will be able to express two feelings regarding the diagnosis 3. Patient will demonstrate their ability to communicate their needs through discussion and/or role play  Summary of Patient Progress: X  Therapeutic Modalities:   Cognitive Behavioral Therapy Brief Therapy Feelings Identification   Basir Niven R. Guerry Bruin, MSW, [place], [place] 05/19/2020 2:08 PM
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
## 2020-05-19 NOTE — Plan of Care (Signed)
  Problem: Education: Goal: Emotional status will improve Outcome: Progressing Goal: Mental status will improve Outcome: Progressing Goal: Verbalization of understanding the information provided will improve Outcome: Progressing   Problem: Activity: Goal: Interest or engagement in activities will improve Outcome: Not Progressing Goal: Sleeping patterns will improve Outcome: Progressing   Problem: Coping: Goal: Ability to verbalize frustrations and anger appropriately will improve Outcome: Progressing   Problem: Health Behavior/Discharge Planning: Goal: Compliance with treatment plan for underlying cause of condition will improve Outcome: Progressing   Problem: Safety: Goal: Periods of time without injury will increase Outcome: Progressing

## 2020-05-19 NOTE — Plan of Care (Signed)
New Admission Problem: Education: Goal: Knowledge of [facility] Education information/materials will improve Outcome: Progressing Goal: Emotional status will improve Outcome: Progressing Goal: Mental status will improve Outcome: Progressing Goal: Verbalization of understanding the information provided will improve Outcome: Progressing   Problem: Activity: Goal: Interest or engagement in activities will improve Outcome: Progressing Goal: Sleeping patterns will improve Outcome: Progressing   Problem: Coping: Goal: Ability to verbalize frustrations and anger appropriately will improve Outcome: Progressing Goal: Ability to demonstrate self-control will improve Outcome: Progressing   Problem: Health Behavior/Discharge Planning: Goal: Identification of resources available to assist in meeting health care needs will improve Outcome: Progressing Goal: Compliance with treatment plan for underlying cause of condition will improve Outcome: Progressing   Problem: Physical Regulation: Goal: Ability to maintain clinical measurements within normal limits will improve Outcome: Progressing   Problem: Safety: Goal: Periods of time without injury will increase Outcome: Progressing
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
## 2020-05-19 NOTE — Progress Notes (Signed)
Pt is pleasant and cooperative with assessment. Pt denies SI and HI. She endorses auditory hallucinations and says the voices bother her, but does not tell this Probation officer what the voices are saying. Pt is compliant with her medications. Pt complains of a headache and indigestion/nausea. Pt was given ibuprofen and Mylanta. Patient is observed to be interacting appropriately with others on the unit and is present during group.   Medications were given per MD orders. Support and encouragement was provided. Pt remains safe on the unit at this time and q15 minute safety checks are maintained.

## 2020-05-19 NOTE — Tx Team (Signed)
Initial Treatment Plan 05/19/2020 12:18 AM Meredith [name] MCR:[id number]    PATIENT STRESSORS: Financial difficulties Marital or family conflict Medication change or noncompliance Occupational concerns Substance abuse   PATIENT STRENGTHS: Agricultural engineer for treatment/growth   PATIENT IDENTIFIED PROBLEMS: Medication Compliance  Alternative home arrangement  Financial stability  Unemployment               DISCHARGE CRITERIA:  Ability to meet basic life and health needs Adequate post-discharge living arrangements Improved stabilization in mood, thinking, and/or behavior Motivation to continue treatment in a less acute level of care Safe-care adequate arrangements made Verbal commitment to aftercare and medication compliance  PRELIMINARY DISCHARGE PLAN: Attend aftercare/continuing care group Return to previous living arrangement  PATIENT/FAMILY INVOLVEMENT: This treatment plan has been presented to and reviewed with the patient, Meredith [name]. The patient and family have been given the opportunity to ask questions and make suggestions.  Randon Goldsmith, RN 05/19/2020, 12:18 AM

## 2020-05-20 MED ORDER — ONDANSETRON 4 MG PO TBDP
4.0000 mg | ORAL_TABLET | Freq: Three times a day (TID) | ORAL | Status: DC | PRN
  Administered 2020-05-21 – 2020-05-28 (×13): 4 mg via ORAL
  Filled 2020-05-20 (×14): qty 1

## 2020-05-20 NOTE — BHH Counselor (Signed)
CSW provided housing resources to include the boarding house list and shelter resources.    Assunta Curtis, MSW, LCSW 05/20/2020 12:28 PM
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
## 2020-05-20 NOTE — Progress Notes (Signed)
Recreation Therapy Notes  Date: 05/20/2020  Time: 9:30 am   Location: Craft room     Behavioral response: N/A   Intervention Topic: Goals   Discussion/Intervention: Patient did not attend group.   Clinical Observations/Feedback:  Patient did not attend group.   Atziri Zubiate LRT/CTRS        Jullisa Grigoryan 05/20/2020 11:35 AM
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
## 2020-05-20 NOTE — Progress Notes (Signed)
   05/20/20 1430  Clinical Encounter Type  Visited With Other (Comment) (See Note)   Ms. Meredith [name] attended and participated in [place] today.

## 2020-05-20 NOTE — Tx Team (Addendum)
Interdisciplinary Treatment and Diagnostic Plan Update  05/20/2020 Time of Session: 09:00 Meredith [name] MRN: [id number]  Principal Diagnosis: Schizoaffective disorder, bipolar type ([place])  Secondary Diagnoses: Principal Problem:   Schizoaffective disorder, bipolar type ([place]) Active Problems:   Tobacco abuse   Generalized anxiety disorder   GERD (gastroesophageal reflux disease)   Opioid use disorder, moderate, on maintenance therapy (HCC)   Asthma   Current Medications:  Current Facility-Administered Medications  Medication Dose Route Frequency Provider Last Rate Last Admin  . acetaminophen (TYLENOL) tablet 650 mg  650 mg Oral Q6H PRN Salley Scarlet, MD      . albuterol (VENTOLIN HFA) 108 (90 Base) MCG/ACT inhaler 1 puff  1 puff Inhalation Q6H Salley Scarlet, MD   1 puff at 05/20/20 0801  . alum & mag hydroxide-simeth (MAALOX/MYLANTA) 200-200-20 MG/5ML suspension 30 mL  30 mL Oral Q4H PRN Salley Scarlet, MD   30 mL at 05/19/20 1358  . ARIPiprazole (ABILIFY) tablet 10 mg  10 mg Oral Daily Salley Scarlet, MD   10 mg at 05/20/20 0758  . buprenorphine-naloxone (SUBOXONE) 8-2 mg per SL tablet 1 tablet  1 tablet Sublingual BID Salley Scarlet, MD   1 tablet at 05/20/20 [phone]  . busPIRone (BUSPAR) tablet 10 mg  10 mg Oral BID Salley Scarlet, MD   10 mg at 05/20/20 0757  . hydrOXYzine (ATARAX/VISTARIL) tablet 25 mg  25 mg Oral TID PRN Salley Scarlet, MD   25 mg at 05/19/20 2057  . ibuprofen (ADVIL) tablet 600 mg  600 mg Oral Q6H PRN Salley Scarlet, MD   600 mg at 05/20/20 0786  . magnesium hydroxide (MILK OF MAGNESIA) suspension 30 mL  30 mL Oral Daily PRN Salley Scarlet, MD      . mirtazapine (REMERON) tablet 7.5 mg  7.5 mg Oral QHS Salley Scarlet, MD   7.5 mg at 05/19/20 2057  . montelukast (SINGULAIR) tablet 10 mg  10 mg Oral QHS Salley Scarlet, MD   10 mg at 05/19/20 2101  . ondansetron (ZOFRAN) tablet 4 mg  4 mg Oral Q8H PRN Salley Scarlet, MD   4 mg at 05/19/20 1702   . pantoprazole (PROTONIX) EC tablet 40 mg  40 mg Oral Daily Salley Scarlet, MD   40 mg at 05/20/20 0758  . polyethylene glycol (MIRALAX / GLYCOLAX) packet 17 g  17 g Oral Daily PRN Salley Scarlet, MD      . torsemide [facility]) tablet 10 mg  10 mg Oral Daily Salley Scarlet, MD   10 mg at 05/20/20 0758  . traZODone (DESYREL) tablet 50 mg  50 mg Oral QHS PRN Salley Scarlet, MD   50 mg at 05/19/20 2057   PTA Medications: Medications Prior to Admission  Medication Sig Dispense Refill Last Dose  . albuterol (VENTOLIN HFA) 108 (90 Base) MCG/ACT inhaler Inhale 2 puffs into the lungs every 6 (six) hours as needed for wheezing or shortness of breath. 1 each 0   . esomeprazole (NEXIUM) 40 MG capsule Take 1 capsule (40 mg total) by mouth daily at 12 noon. (Patient not taking: Reported on 05/16/2020) 30 capsule 6   . fluticasone (FLONASE) 50 MCG/ACT nasal spray Place 1 spray into both nostrils 2 (two) times daily. (Patient not taking: No sig reported)  2   . hydrOXYzine (ATARAX/VISTARIL) 25 MG tablet Take 1 tablet (25 mg total) by mouth 3 (three) times daily as needed for anxiety. (  Patient not taking: No sig reported) 60 tablet 0   . hydrOXYzine (ATARAX/VISTARIL) 50 MG tablet Take 50 mg by mouth 3 (three) times daily.     Marland Kitchen levothyroxine (SYNTHROID) 25 MCG tablet Take 1 tablet (25 mcg total) by mouth daily. 30 tablet 0   . lurasidone 120 MG TABS Take 1 tablet (120 mg total) by mouth daily with supper. (Patient not taking: No sig reported) 30 tablet 0   . meloxicam (MOBIC) 15 MG tablet Take 15 mg by mouth daily.     . montelukast (SINGULAIR) 10 MG tablet Take 1 tablet (10 mg total) by mouth at bedtime. 30 tablet 0   . MOVANTIK 12.5 MG TABS tablet TAKE (1) TABLET BY MOUTH ONCE DAILY. 90 tablet 3   . mupirocin cream (BACTROBAN) 2 % Apply topically 3 (three) times daily. (Patient not taking: No sig reported) 15 g 0   . neomycin-bacitracin-polymyxin (NEOSPORIN) OINT Apply 1 application topically 3 (three)  times daily. (Patient not taking: No sig reported) 30 g 0   . nitrofurantoin (MACRODANTIN) 50 MG capsule Take 1 capsule (50 mg total) by mouth at bedtime. 90 capsule 3   . nitrofurantoin, macrocrystal-monohydrate, (MACROBID) 100 MG capsule Take 1 capsule (100 mg total) by mouth every 12 (twelve) hours. 14 capsule 0   . ondansetron (ZOFRAN-ODT) 4 MG disintegrating tablet Take 4 mg by mouth every 8 (eight) hours as needed.     . ondansetron (ZOFRAN-ODT) 8 MG disintegrating tablet DISSOLVE (1) TABLET BY MOUTH ONCE EVERY 12 HOURS AS NEEDED FOR NAUSEA/VOMITING. (Patient not taking: No sig reported) 20 tablet 2   . polyethylene glycol (MIRALAX / GLYCOLAX) 17 g packet Take 17 g by mouth daily as needed for moderate constipation. (Patient not taking: No sig reported) 14 each 0   . potassium chloride (KLOR-CON) 10 MEQ tablet Take 1 tablet (10 mEq total) by mouth daily as needed. When taking lasix (Patient not taking: No sig reported) 30 tablet 0   . pregabalin (LYRICA) 50 MG capsule Take 1 capsule (50 mg total) by mouth 3 (three) times daily. (Patient not taking: No sig reported) 90 capsule 0   . SPIRIVA HANDIHALER 18 MCG inhalation capsule INHALE 1 CAPSULE BY MOUTHTDAILY.M     . SUBOXONE 8-2 MG FILM Take 1 Film by mouth in the morning and at bedtime.     . tamsulosin (FLOMAX) 0.4 MG CAPS capsule Take 1 capsule (0.4 mg total) by mouth daily. 30 capsule 11   . torsemide (DEMADEX) 10 MG tablet Take 1 tablet (10 mg total) by mouth daily. 30 tablet 3   . traZODone (DESYREL) 150 MG tablet Take 1 tablet (150 mg total) by mouth at bedtime. 30 tablet 0   . Vitamin D, Ergocalciferol, (DRISDOL) 1.25 MG (50000 UNIT) CAPS capsule Take 50,000 Units by mouth once a week.       Patient Stressors: Financial difficulties Marital or family conflict Medication change or noncompliance Occupational concerns Substance abuse  Patient Strengths: Agricultural engineer for treatment/growth  Treatment Modalities:  Medication Management, Group therapy, Case management,  1 to 1 session with clinician, Psychoeducation, Recreational therapy.   Physician Treatment Plan for Primary Diagnosis: Schizoaffective disorder, bipolar type ([place]) Long Term Goal(s): Improvement in symptoms so as ready for discharge Improvement in symptoms so as ready for discharge   Short Term Goals: Ability to identify changes in lifestyle to reduce recurrence of condition will improve Ability to verbalize feelings will improve Ability to disclose and discuss suicidal ideas Ability  to demonstrate self-control will improve Ability to identify and develop effective coping behaviors will improve Compliance with prescribed medications will improve Ability to identify changes in lifestyle to reduce recurrence of condition will improve Ability to verbalize feelings will improve Ability to disclose and discuss suicidal ideas Ability to demonstrate self-control will improve Ability to identify and develop effective coping behaviors will improve Compliance with prescribed medications will improve Ability to identify triggers associated with substance abuse/mental health issues will improve  Medication Management: Evaluate patient's response, side effects, and tolerance of medication regimen.  Therapeutic Interventions: 1 to 1 sessions, Unit Group sessions and Medication administration.  Evaluation of Outcomes: Not Met  Physician Treatment Plan for Secondary Diagnosis: Principal Problem:   Schizoaffective disorder, bipolar type ([place]) Active Problems:   Tobacco abuse   Generalized anxiety disorder   GERD (gastroesophageal reflux disease)   Opioid use disorder, moderate, on maintenance therapy (HCC)   Asthma  Long Term Goal(s): Improvement in symptoms so as ready for discharge Improvement in symptoms so as ready for discharge   Short Term Goals: Ability to identify changes in lifestyle to reduce recurrence of condition will  improve Ability to verbalize feelings will improve Ability to disclose and discuss suicidal ideas Ability to demonstrate self-control will improve Ability to identify and develop effective coping behaviors will improve Compliance with prescribed medications will improve Ability to identify changes in lifestyle to reduce recurrence of condition will improve Ability to verbalize feelings will improve Ability to disclose and discuss suicidal ideas Ability to demonstrate self-control will improve Ability to identify and develop effective coping behaviors will improve Compliance with prescribed medications will improve Ability to identify triggers associated with substance abuse/mental health issues will improve     Medication Management: Evaluate patient's response, side effects, and tolerance of medication regimen.  Therapeutic Interventions: 1 to 1 sessions, Unit Group sessions and Medication administration.  Evaluation of Outcomes: Not Met   RN Treatment Plan for Primary Diagnosis: Schizoaffective disorder, bipolar type ([place]) Long Term Goal(s): Knowledge of disease and therapeutic regimen to maintain health will improve  Short Term Goals: Ability to demonstrate self-control, Ability to participate in decision making will improve, Ability to identify and develop effective coping behaviors will improve and Compliance with prescribed medications will improve  Medication Management: RN will administer medications as ordered by provider, will assess and evaluate patient's response and provide education to patient for prescribed medication. RN will report any adverse and/or side effects to prescribing provider.  Therapeutic Interventions: 1 on 1 counseling sessions, Psychoeducation, Medication administration, Evaluate responses to treatment, Monitor vital signs and CBGs as ordered, Perform/monitor CIWA, COWS, AIMS and Fall Risk screenings as ordered, Perform wound care treatments as  ordered.  Evaluation of Outcomes: Not Met   LCSW Treatment Plan for Primary Diagnosis: Schizoaffective disorder, bipolar type ([place]) Long Term Goal(s): Safe transition to appropriate next level of care at discharge, Engage patient in therapeutic group addressing interpersonal concerns.  Short Term Goals: Engage patient in aftercare planning with referrals and resources, Increase social support, Increase emotional regulation, Facilitate acceptance of mental health diagnosis and concerns, Identify triggers associated with mental health/substance abuse issues and Increase skills for wellness and recovery  Therapeutic Interventions: Assess for all discharge needs, 1 to 1 time with Social worker, Explore available resources and support systems, Assess for adequacy in community support network, Educate family and significant other(s) on suicide prevention, Complete Psychosocial Assessment, Interpersonal group therapy.  Evaluation of Outcomes: Not Met   Progress in Treatment: Attending groups:  No. Participating in groups: No. Taking medication as prescribed: Yes. Toleration medication: Yes. Family/Significant other contact made: No, will contact:  friend Patient understands diagnosis: No. Discussing patient identified problems/goals with staff: Yes. Medical problems stabilized or resolved: No. Denies suicidal/homicidal ideation: Yes. Issues/concerns per patient self-inventory: Yes. Other:   New problem(s) identified: No, Describe:  No new issues identified at this time  New Short Term/Long Term Goal(s): Engage patient in aftercare planning with referrals and resources, Increase social support, Increase emotional regulation, Facilitate acceptance of mental health diagnosis and concerns, Identify triggers associated with mental health/substance abuse issues and Increase skills for wellness and recovery  Patient Goals:  "To be more positive than before and to get back out into  society"  Discharge Plan or Barriers: CSW will assist Pt to return back to her home and assist with obtaining an appointment with outpatient mental health support. CSW will assist pt with transportation.    Reason for Continuation of Hospitalization: Aggression Delusions  Depression Hallucinations Medical Issues Medication stabilization  Estimated Length of Stay: 1-7 days   Recreational Therapy: Patient Stressors: N/A Patient Goal: Patient will engage in groups without prompting or encouragement from LRT x3 group sessions within 5 recreation therapy group sessions.  Attendees: Patient: Meredith [name]. Meredith [name] 05/20/2020 10:10 AM  Physician: Salley Scarlet, MD 05/20/2020 10:10 AM  Nursing:  05/20/2020 10:10 AM  RN Care Manager: Marla Roe, RN 05/20/2020 10:10 AM  Social Worker: Assunta Curtis, MSW, LCSW 05/20/2020 10:10 AM  Recreational Therapist: Roanna Epley, Reather Converse, LRT 05/20/2020 10:10 AM  Other: Kesa Birky [place], MSW, LCSW-A 05/20/2020 10:10 AM  Other: Michell Heinrich, MSW, [place], [place] 05/20/2020 10:10 AM  Other:  05/20/2020 10:10 AM    Scribe for Treatment Team: Abrie Egloff A [place], [place] 05/20/2020 10:10 AM

## 2020-05-20 NOTE — [facility] Group Notes (Signed)
LCSW Group Therapy Note  05/20/2020 2:09 PM  Type of Therapy/Topic:  Group Therapy:  Emotion Regulation  Participation Level:  None   Description of Group:   The purpose of this group is to assist patients in learning to regulate negative emotions and experience positive emotions. Patients will be guided to discuss ways in which they have been vulnerable to their negative emotions. These vulnerabilities will be juxtaposed with experiences of positive emotions or situations, and patients will be challenged to use positive emotions to combat negative ones. Special emphasis will be placed on coping with negative emotions in conflict situations, and patients will process healthy conflict resolution skills.  Therapeutic Goals: 1. Patient will identify two positive emotions or experiences to reflect on in order to balance out negative emotions 2. Patient will label two or more emotions that they find the most difficult to experience 3. Patient will demonstrate positive conflict resolution skills through discussion and/or role plays  Summary of Patient Progress: Patient was present in group, however, patient did not engage in group discussions.  Patient appeared to be asleep for much of group.  Therapeutic Modalities:   Cognitive Behavioral Therapy Feelings Identification Dialectical Behavioral Therapy  Assunta Curtis, MSW, LCSW 05/20/2020 2:09 PM

## 2020-05-20 NOTE — Progress Notes (Signed)
Pt is hyperverbal and apologizing for her behaviors. She is also having delusions and says someone is cutting up her feet.

## 2020-05-20 NOTE — [facility] Suicide Risk Assessment (Signed)
[place] INPATIENT:  Family/Significant Other Suicide Prevention Education  Suicide Prevention Education:  Education Completed; [name], friend, [phone] has been identified by the patient as the family member/significant other with whom the patient will be residing, and identified as the person(s) who will aid the patient in the event of a mental health crisis (suicidal ideations/suicide attempt).  With written consent from the patient, the family member/significant other has been provided the following suicide prevention education, prior to the and/or following the discharge of the patient.  The suicide prevention education provided includes the following:  Suicide risk factors  Suicide prevention and interventions  National Suicide Hotline telephone number  [facility] assessment telephone number  [facility] Emergency Assistance [place] and/or Residential Mobile Crisis Unit telephone number  Request made of family/significant other to:  Remove weapons (e.g., guns, rifles, knives), all items previously/currently identified as safety concern.    Remove drugs/medications (over-the-counter, prescriptions, illicit drugs), all items previously/currently identified as a safety concern.  The family member/significant other verbalizes understanding of the suicide prevention education information provided.  The family member/significant other agrees to remove the items of safety concern listed above.  CSW spoke with the patient's friend. He reports that the patient "is off her meds".  He reports that the medications that patient was on helped a little bit but was not very effective".  He reports that she was on Latuda and Trazodone, he reports that he is unsure of the names of other medications.  He reports "she's having a full blown conversation with people and they aren't there."  He reports that patient is not a danger to self or others.  He denies that patient  has any access to weapons.  Rozann Lesches 05/20/2020, 11:17 AM

## 2020-05-20 NOTE — Progress Notes (Signed)
Patient pleasant and cooperative. Isolative to self and room. Out for snack and meds. Prn given for anxiety and sleep with good relief. Denies SI, HI, AVH. Voiced no concerns or complaints. Encouragement and support provided. Safety checks maintained. Medications given as prescribed. Pt receptive and remains safe on unit with q 15 min checks.

## 2020-05-20 NOTE — [facility] Group Notes (Signed)
[place] Group Notes:  (Nursing/MHT/Case Management/Adjunct)  Date:  05/20/2020  Time:  9:52 PM  Type of Therapy:  Group Therapy  Participation Level:  Active  Participation Quality:  Drowsy  Affect:  Appropriate  Cognitive:  Alert  Insight:  Good  Engagement in Group:  Engaged and said had a good than yesterday.  Modes of Intervention:  Support  Summary of Progress/Problems:  Nehemiah Settle 05/20/2020, 9:52 PM

## 2020-05-20 NOTE — Progress Notes (Signed)
Patient was pleasant on assessment. Pt states she slept better than on the previous night and is less nauseous than yesterday. Pt denies SI and HI. She endorses auditory hallucinations, but says she is trying to tune out the voices. She tells this Probation officer she cannot make out what they are saying. Pt also complained of a headache in the morning but said the Advil helped.   Medications were given per MD orders. Support and encouragement was provided. Pt remains safe on the unit at this time and q15 minute safety checks are maintained.

## 2020-05-21 LAB — URINALYSIS, ROUTINE W REFLEX MICROSCOPIC
Bilirubin Urine: NEGATIVE
Glucose, UA: NEGATIVE mg/dL
Ketones, ur: NEGATIVE mg/dL
Leukocytes,Ua: NEGATIVE
Nitrite: NEGATIVE
Protein, ur: NEGATIVE mg/dL
Specific Gravity, Urine: 1.015 (ref 1.005–1.030)
pH: 5 (ref 5.0–8.0)

## 2020-05-21 MED ORDER — TORSEMIDE 10 MG PO TABS
5.0000 mg | ORAL_TABLET | Freq: Every day | ORAL | Status: DC
  Administered 2020-05-22: 5 mg via ORAL
  Filled 2020-05-21: qty 0.5

## 2020-05-21 MED ORDER — ARIPIPRAZOLE 5 MG PO TABS
15.0000 mg | ORAL_TABLET | Freq: Every day | ORAL | Status: DC
  Administered 2020-05-22: 15 mg via ORAL
  Filled 2020-05-21: qty 1

## 2020-05-21 MED ORDER — CLOTRIMAZOLE 1 % EX CREA
TOPICAL_CREAM | Freq: Two times a day (BID) | CUTANEOUS | Status: DC
  Administered 2020-05-24: 1 via TOPICAL
  Filled 2020-05-21: qty 15

## 2020-05-21 NOTE — Progress Notes (Signed)
Recreation Therapy Notes  Date: 05/21/2020  Time: 10:00 am  Location: Craft room   Behavioral response: Appropriate   Intervention Topic: Animal Assisted Therapy   Discussion/Intervention:  Animal Assisted Therapy took place today during group.  Animal Assisted Therapy is the planned inclusion of an animal in a patient's treatment plan. The patients were able to engage in therapy with an animal during group. Participants were educated on what a service dog is and the different between a support dog and a service dog. Patient were informed on the many animal needs there are and how their needs are similar. Individuals were enlightened on the process to get a service animal or support animal. Patients got the opportunity to pet the animal and were offered emotional support from the animal and staff.  Clinical Observations/Feedback:  Patient came to group and was on topic and was focused on what peers and staff had to say. Participant shared their experiences and history with animals. Individual was social with peers, staff and animal while participating in group.  Samauri Kellenberger LRT/CTRS         Taraya Steward 05/21/2020 12:34 PM

## 2020-05-21 NOTE — Progress Notes (Signed)
Patient alert and oriented x 4, affect is flat but brightens upon approach, her thoughts are organized, logical and coherent no delusions noted, she was receptive to staff, appropriate with peers. Patient currently denies SI/HI/AVH she was complaint with scheduled evening medication, patient was offered emotional support and encouragement, 15 minutes safety checks maintained will continue to monitor.
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
## 2020-05-21 NOTE — Progress Notes (Signed)
Patient alert and oriented x 4, affect is flat but brightens upon approach, her thoughts are organized and coherent no bizarre behavior noted. Patient currently denies SI/HI/AVH she was noted interacting appropriately with peers and staff. Patient is complaint with scheduled evening medication, 15 minutes safety checks maintained will continue to monitor.

## 2020-05-21 NOTE — Progress Notes (Signed)
Recreation Therapy Notes  INPATIENT RECREATION THERAPY ASSESSMENT  Patient Details Name: Meredith [name] MRN: [id number] DOB: 1965-07-04 Today's Date: 05/21/2020       Information Obtained From: Patient  Able to Participate in Assessment/Interview: Yes  Patient Presentation: Responsive  Reason for Admission (Per Patient): Active Symptoms  Patient Stressors:    Coping Skills:   Printmaker (2+):  Games - Word-search,Games - Cross-word,Individual - TV,Music - Listen  Frequency of Recreation/Participation: Weekly  Awareness of Community Resources:     [employer]:     Current Use:    If no, Barriers?:    Expressed Interest in [employer] Information:    [place] of Residence:  Acupuncturist  Patient Main Form of Transportation: Musician  Patient Strengths:  People person  Patient Identified Areas of Improvement:  My attitude  Patient Goal for Hospitalization:  To improve my overall health  Current SI (including self-harm):  No  Current HI:  No  Current AVH: Yes (hearing voices)  Staff Intervention Plan: Group Attendance,Collaborate with Interdisciplinary Treatment Team  Consent to Intern Participation: N/A  Agripina Guyette 05/21/2020, 4:16 PM

## 2020-05-21 NOTE — [facility] Group Notes (Signed)
LCSW Group Therapy Note     05/21/2020 2:14 PM     Type of Therapy/Topic:  Group Therapy:  Balance in Life     Participation Level:  Did Not Attend     Description of Group:    This group will address the concept of balance and how it feels and looks when one is unbalanced. Patients will be encouraged to process areas in their lives that are out of balance and identify reasons for remaining unbalanced. Facilitators will guide patients in utilizing problem-solving interventions to address and correct the stressor making their life unbalanced. Understanding and applying boundaries will be explored and addressed for obtaining and maintaining a balanced life. Patients will be encouraged to explore ways to assertively make their unbalanced needs known to significant others in their lives, using other group members and facilitator for support and feedback.     Therapeutic Goals:  1.      Patient will identify two or more emotions or situations they have that consume much of in their lives.  2.      Patient will identify signs/triggers that life has become out of balance:  3.      Patient will identify two ways to set boundaries in order to achieve balance in their lives:  4.      Patient will demonstrate ability to communicate their needs through discussion and/or role plays     Summary of Patient Progress: X   Therapeutic Modalities:   Cognitive Behavioral Therapy  Solution-Focused Therapy  Assertiveness Training     Aryana Wonnacott [place] MSW, LCSW-A  05/21/2020 2:14 PM

## 2020-05-21 NOTE — Progress Notes (Signed)
Recreation Therapy Notes  INPATIENT RECREATION TR PLAN  Patient Details Name: Meredith [name] MRN: [id number] DOB: 10/01/1965 Today's Date: 05/21/2020  Rec Therapy Plan Is patient appropriate for Therapeutic Recreation?: Yes Treatment times per week: at least 3 Estimated Length of Stay: 5-7 days TR Treatment/Interventions: Group participation (Comment)  Discharge Criteria Pt will be discharged from therapy if:: Discharged Treatment plan/goals/alternatives discussed and agreed upon by:: Patient/family  Discharge Summary     Derionna Salvador 05/21/2020, 4:18 PM

## 2020-05-22 ENCOUNTER — Telehealth: Payer: Self-pay

## 2020-05-22 MED ORDER — ARIPIPRAZOLE 10 MG PO TABS
20.0000 mg | ORAL_TABLET | Freq: Every day | ORAL | Status: DC
  Administered 2020-05-23 – 2020-05-29 (×7): 20 mg via ORAL
  Filled 2020-05-22 (×8): qty 2

## 2020-05-22 MED ORDER — SULFAMETHOXAZOLE-TRIMETHOPRIM 800-160 MG PO TABS
1.0000 | ORAL_TABLET | Freq: Two times a day (BID) | ORAL | Status: AC
  Administered 2020-05-22 – 2020-05-26 (×10): 1 via ORAL
  Filled 2020-05-22 (×10): qty 1

## 2020-05-22 NOTE — [facility] Group Notes (Signed)
LCSW Group Therapy Note     05/22/2020 2:31 PM     Type of Therapy and Topic:  Group Therapy:  Feelings around Relapse and Recovery     Participation Level:  Active     Description of Group:    Patients in this group will discuss emotions they experience before and after a relapse. They will process how experiencing these feelings, or avoidance of experiencing them, relates to having a relapse. Facilitator will guide patients to explore emotions they have related to recovery. Patients will be encouraged to process which emotions are more powerful. They will be guided to discuss the emotional reaction significant others in their lives may have to their relapse or recovery. Patients will be assisted in exploring ways to respond to the emotions of others without this contributing to a relapse.     Therapeutic Goals:  1.    Patient will identify two or more emotions that lead to a relapse for them  2.    Patient will identify two emotions that result when they relapse  3.    Patient will identify two emotions related to recovery  4.    Patient will demonstrate ability to communicate their needs through discussion and/or role plays      Summary of Patient Progress: Pt stated that she had to "make her mind up" about choosing no longer to problematically use illegal substances. She said that pain was a large contributing factor to her use but had to make a choice that it was no longer worth it. She stated that she had some supports to assist her in the process. We discussed the importance of values in the journey of recovery and relapse.    Therapeutic Modalities:   Cognitive Behavioral Therapy  Solution-Focused Therapy  Assertiveness Training  Relapse Prevention Therapy        Zaccheus Edmister [place], MSW, [place]  05/22/2020 2:31 PM

## 2020-05-22 NOTE — Plan of Care (Signed)
Patient is more visible in the milieu today with improved thought process. Alert and oriented. Denying thoughts of self-harm. Taking medications as scheduled. Getting along with peers. Has no sign of distress. Safety monitored as expected.

## 2020-05-22 NOTE — Telephone Encounter (Signed)
-----   Message from Cleon Gustin, MD sent at 05/11/2020  9:49 AM EST ----- negative ----- Message ----- From: Valentina Lucks, LPN Sent: 12/05/5699  77:93 AM EST To: Cleon Gustin, MD  Pls review.

## 2020-05-22 NOTE — Telephone Encounter (Signed)
Notified pt contact that urine culture was negative.
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
## 2020-05-23 LAB — URINE CULTURE: Culture: >=100000 — AB

## 2020-05-23 MED ORDER — POLYETHYLENE GLYCOL 3350 17 G PO PACK
17.0000 g | PACK | Freq: Every day | ORAL | Status: DC
  Administered 2020-05-23 – 2020-05-29 (×5): 17 g via ORAL
  Filled 2020-05-23 (×6): qty 1

## 2020-05-23 MED ORDER — HYDROXYZINE HCL 25 MG PO TABS
25.0000 mg | ORAL_TABLET | Freq: Once | ORAL | Status: AC
  Administered 2020-05-23: 25 mg via ORAL
  Filled 2020-05-23: qty 1

## 2020-05-23 NOTE — [facility] Group Notes (Signed)
  [facility] LCSW Group Therapy Note  Date/Time:  05/23/2020 1:44 PM- 2:57 PM   Type of Therapy and Topic:  Group Therapy:  Feelings About Hospitalization  Participation Level:  Active   Description of Group This process group involved patients discussing their feelings related to being hospitalized, as well as the benefits they see to being in the hospital.  These feelings and benefits were itemized.  The group then brainstormed specific ways in which they could seek those same benefits when they discharge and return home.  Therapeutic Goals 1. Patient will identify and describe positive and negative feelings related to hospitalization 2. Patient will verbalize benefits of hospitalization to themselves personally 3. Patients will brainstorm together ways they can obtain similar benefits in the outpatient setting, identify barriers to wellness and possible solutions  Summary of Patient Progress:  Patient did not speak about positives or negatives. Patient asked about resources and places she can go once she is discharged from the hospital.   Therapeutic Modalities Cognitive [place], [place] 05/23/2020  4:30 PM

## 2020-05-23 NOTE — [facility] Counselor (Signed)
CSW spoke with patient about contacting boarding houses. Patient stated that she will try to call after dinner to see if she can get into a place with her friend.
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
## 2020-05-23 NOTE — Progress Notes (Signed)
Patient  Has been active in the milieu but irritable at times. Has been requesting Vistaril and Zofran. Attended groups and  Participated in different activities. Denying thoughts of self-harm. Discussed about housing and was encouraged to talk to SW. Had no additional concerns. Encouragements and support provided. Safety monitored as expected.

## 2020-05-23 NOTE — Plan of Care (Signed)
Patient is out of bed and visible in the milieu. Cooperative and denying thoughts of self-harm. Denying hallucinations. Reports that she slept well. Reports feeling anxious and requested Vistaril. No additional concerns. Staff offering support and encouragements. Safety monitored as recommended.

## 2020-05-23 NOTE — Progress Notes (Signed)
Patient alert and oriented x 2, with periods of confusion to time, her affect is blunted, she appears irritable and angry towards staff, she was verbally aggressive towards staff using profanities she later apologized to staff.   Patients thoughts are disorganized, and incoherent, she currently denies SI/HI/AVH she was complaint with scheduled evening medication, patient was offered emotional support and encouragement, 15 minutes safety checks maintained will continue to monitor

## 2020-05-24 MED ORDER — BUSPIRONE HCL 5 MG PO TABS
15.0000 mg | ORAL_TABLET | Freq: Two times a day (BID) | ORAL | Status: DC
  Administered 2020-05-24 – 2020-05-29 (×10): 15 mg via ORAL
  Filled 2020-05-24 (×10): qty 3

## 2020-05-24 MED ORDER — BUSPIRONE HCL 5 MG PO TABS
5.0000 mg | ORAL_TABLET | Freq: Once | ORAL | Status: AC
  Administered 2020-05-24: 5 mg via ORAL
  Filled 2020-05-24: qty 1

## 2020-05-24 MED ORDER — TORSEMIDE 10 MG PO TABS
10.0000 mg | ORAL_TABLET | Freq: Every day | ORAL | Status: DC
  Administered 2020-05-25 – 2020-05-29 (×5): 10 mg via ORAL
  Filled 2020-05-24 (×5): qty 1

## 2020-05-24 NOTE — Progress Notes (Signed)
Patient presents with sad, flat affect but brightens on approach. Denies any SI, HI, AVH. Medication compliant. Complains of generalized pain. Prn given with good relief. Pt endorses depression, but denies Si, HI, AVH. Isolative for self and room. Out for snack and meds. Minimal interaction with staff and peers. Encouragement and support provided. Safety checks maintained. Medications given as prescribed. Pt receptive and remains safe on unit with q 15 min checks.

## 2020-05-24 NOTE — Plan of Care (Signed)
  Problem: Education: Goal: Knowledge of [place] [facility] Education information/materials will improve Outcome: Progressing Goal: Emotional status will improve Outcome: Not Progressing Goal: Mental status will improve Outcome: Progressing Goal: Verbalization of understanding the information provided will improve Outcome: Progressing   Problem: Activity: Goal: Interest or engagement in activities will improve Outcome: Not Progressing Goal: Sleeping patterns will improve Outcome: Progressing   Problem: Coping: Goal: Ability to verbalize frustrations and anger appropriately will improve Outcome: Progressing Goal: Ability to demonstrate self-control will improve Outcome: Progressing   Problem: Health Behavior/Discharge Planning: Goal: Compliance with treatment plan for underlying cause of condition will improve Outcome: Progressing   Problem: Physical Regulation: Goal: Ability to maintain clinical measurements within normal limits will improve Outcome: Progressing   Problem: Safety: Goal: Periods of time without injury will increase Outcome: Progressing
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
## 2020-05-24 NOTE — Progress Notes (Signed)
Patient denies SI and HI. She says, "I only hear voices from the outside". Pt states, "I want to go home, I know you all just want to help me." She feels she is not completely better, but does not want to stay any longer. Pt reports waking up several times throughout the night. She is still endorsing anxiety and was given hydroxyzine PRN. Pt is observed to be eating breakfast and interacting appropriately with peers.  Medications were given per MD orders. Patient remains safe on the unit at this time and q15 minute safety checks are maintained.

## 2020-05-24 NOTE — Progress Notes (Signed)
Patient approaches this writer asking for ibuprofen for general body aches. She says under her breath, "I can't do this anymore". When asked about this, patient will not elaborate further. She does state she is worried about her living situation and going back home.

## 2020-05-24 NOTE — [facility] Group Notes (Signed)
LCSW Group Therapy Note  05/24/2020   1:00 - 2:00 PM  Type of Therapy and Topic:  Group Therapy: Anger Cues and Responses  Participation Level: Active   Description of Group:   In this group, patients learned how to recognize the physical, cognitive, emotional, and behavioral responses they have to anger-provoking situations.  They identified a recent time they became angry and how they reacted.  They analyzed how their reaction was possibly beneficial and how it was possibly unhelpful.  The group discussed a variety of healthier coping skills that could help with such a situation in the future.  Focus was placed on how helpful it is to recognize the underlying emotions to our anger, because working on those can lead to a more permanent solution as well as our ability to focus on the important rather than the urgent.  Therapeutic Goals: 1. Patients will remember their last incident of anger and how they felt emotionally and physically, what their thoughts were at the time, and how they behaved. 2. Patients will identify how their behavior at that time worked for them, as well as how it worked against them. 3. Patients will explore possible new behaviors to use in future anger situations. 4. Patients will learn that anger itself is normal and cannot be eliminated, and that healthier reactions can assist with resolving conflict rather than worsening situations.  Summary of Patient Progress:  The patient shared that she does not really get angry but mostly struggles with being upset and worrying. Pt reports the last time she was upset was when her medication changed. Pt reports not understanding the reasoning behind changing her meds as they have always been the same. Pt reported feeling worse now than at admission. Pt was dozing in and out during group. Pt reports what she got out of group was knowing it's okay to be angry but then I have the power to choose how to respond.   Therapeutic Modalities:    Cognitive Behavioral Therapy  Tye Savoy

## 2020-05-24 NOTE — [facility] Group Notes (Signed)
[facility] Group Notes:  (Nursing/MHT/Case Management/Adjunct)  Date:  05/24/2020  Time:  9:03 PM  Type of Therapy:  Group Therapy  Participation Level:  Active  Participation Quality:  Appropriate  Affect:  Appropriate  Cognitive:  Alert  Insight:  Good  Engagement in Group:  Engaged and said here get med's right.  Modes of Intervention:  Support  Summary of Progress/Problems:  Meredith [name] 05/24/2020, 9:03 PM

## 2020-05-25 MED ORDER — POTASSIUM CHLORIDE 20 MEQ PO PACK
20.0000 meq | PACK | Freq: Every day | ORAL | Status: DC
  Filled 2020-05-25 (×2): qty 1

## 2020-05-25 MED ORDER — HYDROXYZINE HCL 50 MG PO TABS
50.0000 mg | ORAL_TABLET | Freq: Three times a day (TID) | ORAL | Status: DC | PRN
  Administered 2020-05-25 – 2020-05-26 (×4): 50 mg via ORAL
  Filled 2020-05-25 (×4): qty 1

## 2020-05-25 MED ORDER — LITHIUM CARBONATE 150 MG PO CAPS
150.0000 mg | ORAL_CAPSULE | Freq: Every day | ORAL | Status: DC
  Administered 2020-05-25 – 2020-05-27 (×3): 150 mg via ORAL
  Filled 2020-05-25 (×3): qty 1

## 2020-05-25 MED ORDER — TRAZODONE HCL 100 MG PO TABS
100.0000 mg | ORAL_TABLET | Freq: Every evening | ORAL | Status: DC | PRN
  Administered 2020-05-25 – 2020-05-28 (×4): 100 mg via ORAL
  Filled 2020-05-25 (×4): qty 1

## 2020-05-25 NOTE — Progress Notes (Signed)
Patient calm and cooperative during assessment denying SI/HI/AVH. Patient stated she was feeling better than this morning when she was nauseas. Patient observed interacting appropriately with staff and peers on the unit. Patient compliant with medication administration per MD orders. Pt given education, support, and encouragement to be active in her treatment plan. Patient being monitored Q 15 minutes for safety per unit protocol. Pt remains safe on the unit.

## 2020-05-25 NOTE — Progress Notes (Signed)
Patient says she did have a bowel movement today. She mumbled when getting her hs medications "this is not what I usually take" but when asked what she usually took, she could not say. She refused her 2am inhaler, but did ask for and receive trazodone for sleep. Denies SI HI and AVH

## 2020-05-25 NOTE — Plan of Care (Signed)
Patient endorses anxiety and depression   Problem: Education: Goal: Emotional status will improve Outcome: Not Progressing Goal: Mental status will improve Outcome: Not Progressing

## 2020-05-25 NOTE — Plan of Care (Signed)
  Problem: Education: Goal: Knowledge of [facility] Education information/materials will improve Outcome: Progressing Goal: Emotional status will improve Outcome: Progressing Goal: Mental status will improve Outcome: Progressing Goal: Verbalization of understanding the information provided will improve Outcome: Progressing   Problem: Activity: Goal: Interest or engagement in activities will improve Outcome: Progressing Goal: Sleeping patterns will improve Outcome: Progressing   Problem: Coping: Goal: Ability to verbalize frustrations and anger appropriately will improve Outcome: Progressing Goal: Ability to demonstrate self-control will improve Outcome: Progressing   Problem: Health Behavior/Discharge Planning: Goal: Identification of resources available to assist in meeting health care needs will improve Outcome: Progressing Goal: Compliance with treatment plan for underlying cause of condition will improve Outcome: Progressing   Problem: Physical Regulation: Goal: Ability to maintain clinical measurements within normal limits will improve Outcome: Progressing   Problem: Safety: Goal: Periods of time without injury will increase Outcome: Progressing   
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
## 2020-05-25 NOTE — [facility] Group Notes (Signed)
LCSW Group Therapy Note   05/25/2020 1:17 PM  Type of Therapy and Topic:  Group Therapy:  Overcoming Obstacles   Participation Level:  Did Not Attend   Description of Group:    In this group patients will be encouraged to explore what they see as obstacles to their own wellness and recovery. They will be guided to discuss their thoughts, feelings, and behaviors related to these obstacles. The group will process together ways to cope with barriers, with attention given to specific choices patients can make. Each patient will be challenged to identify changes they are motivated to make in order to overcome their obstacles. This group will be process-oriented, with patients participating in exploration of their own experiences as well as giving and receiving support and challenge from other group members.   Therapeutic Goals: 1. Patient will identify personal and current obstacles as they relate to admission. 2. Patient will identify barriers that currently interfere with their wellness or overcoming obstacles.  3. Patient will identify feelings, thought process and behaviors related to these barriers. 4. Patient will identify two changes they are willing to make to overcome these obstacles:      Summary of Patient Progress X     Therapeutic Modalities:   Cognitive Behavioral Therapy Solution Focused Therapy Motivational Interviewing Relapse Prevention Therapy  Hedy Camara R. Guerry Bruin, MSW, LCSW, LCAS 05/25/2020 1:17 PM

## 2020-05-25 NOTE — Tx Team (Addendum)
Interdisciplinary Treatment and Diagnostic Plan Update  05/25/2020 Time of Session: 08:30AM Meredith [name] MRN: [id number]  Principal Diagnosis: Schizoaffective disorder, bipolar type ([place])  Secondary Diagnoses: Principal Problem:   Schizoaffective disorder, bipolar type ([place]) Active Problems:   Tobacco abuse   Generalized anxiety disorder   GERD (gastroesophageal reflux disease)   Opioid use disorder, moderate, on maintenance therapy (HCC)   Asthma   Current Medications:  Current Facility-Administered Medications  Medication Dose Route Frequency Provider Last Rate Last Admin  . acetaminophen (TYLENOL) tablet 650 mg  650 mg Oral Q6H PRN Salley Scarlet, MD   650 mg at 05/22/20 1205  . albuterol (VENTOLIN HFA) 108 (90 Base) MCG/ACT inhaler 1 puff  1 puff Inhalation Q6H Salley Scarlet, MD   1 puff at 05/25/20 [phone]  . alum & mag hydroxide-simeth (MAALOX/MYLANTA) 200-200-20 MG/5ML suspension 30 mL  30 mL Oral Q4H PRN Salley Scarlet, MD   30 mL at 05/19/20 1358  . ARIPiprazole (ABILIFY) tablet 20 mg  20 mg Oral Daily Salley Scarlet, MD   20 mg at 05/25/20 1829  . buprenorphine-naloxone (SUBOXONE) 8-2 mg per SL tablet 1 tablet  1 tablet Sublingual BID Salley Scarlet, MD   1 tablet at 05/25/20 9371  . busPIRone (BUSPAR) tablet 15 mg  15 mg Oral BID Rulon Sera, MD   15 mg at 05/25/20 6967  . clotrimazole (LOTRIMIN) 1 % cream   Topical BID Salley Scarlet, MD   1 application at 89/38/10 1751  . hydrOXYzine (ATARAX/VISTARIL) tablet 25 mg  25 mg Oral TID PRN Salley Scarlet, MD   25 mg at 05/25/20 0258  . ibuprofen (ADVIL) tablet 600 mg  600 mg Oral Q6H PRN Salley Scarlet, MD   600 mg at 05/25/20 0744  . magnesium hydroxide (MILK OF MAGNESIA) suspension 30 mL  30 mL Oral Daily PRN Salley Scarlet, MD      . mirtazapine (REMERON) tablet 7.5 mg  7.5 mg Oral QHS Salley Scarlet, MD   7.5 mg at 05/24/20 2050  . montelukast (SINGULAIR) tablet 10 mg  10 mg Oral QHS Salley Scarlet, MD    10 mg at 05/24/20 2050  . ondansetron (ZOFRAN-ODT) disintegrating tablet 4 mg  4 mg Oral Q8H PRN Salley Scarlet, MD   4 mg at 05/25/20 [phone]  . pantoprazole (PROTONIX) EC tablet 40 mg  40 mg Oral Daily Salley Scarlet, MD   40 mg at 05/25/20 8242  . polyethylene glycol (MIRALAX / GLYCOLAX) packet 17 g  17 g Oral Daily PRN Salley Scarlet, MD      . polyethylene glycol (MIRALAX / GLYCOLAX) packet 17 g  17 g Oral Daily Rulon Sera, MD   17 g at 05/25/20 0905  . sulfamethoxazole-trimethoprim (BACTRIM DS) 800-160 MG per tablet 1 tablet  1 tablet Oral Q12H Salley Scarlet, MD   1 tablet at 05/25/20 [phone]  . torsemide (DEMADEX) tablet 10 mg  10 mg Oral Daily Salley Scarlet, MD   10 mg at 05/25/20 1443  . traZODone (DESYREL) tablet 50 mg  50 mg Oral QHS PRN Salley Scarlet, MD   50 mg at 05/24/20 2051   PTA Medications: Medications Prior to Admission  Medication Sig Dispense Refill Last Dose  . albuterol (VENTOLIN HFA) 108 (90 Base) MCG/ACT inhaler Inhale 2 puffs into the lungs every 6 (six) hours as needed for wheezing or shortness of breath. 1 each 0   .  esomeprazole (NEXIUM) 40 MG capsule Take 1 capsule (40 mg total) by mouth daily at 12 noon. (Patient not taking: Reported on 05/16/2020) 30 capsule 6   . fluticasone (FLONASE) 50 MCG/ACT nasal spray Place 1 spray into both nostrils 2 (two) times daily. (Patient not taking: No sig reported)  2   . hydrOXYzine (ATARAX/VISTARIL) 25 MG tablet Take 1 tablet (25 mg total) by mouth 3 (three) times daily as needed for anxiety. (Patient not taking: No sig reported) 60 tablet 0   . hydrOXYzine (ATARAX/VISTARIL) 50 MG tablet Take 50 mg by mouth 3 (three) times daily.     Marland Kitchen levothyroxine (SYNTHROID) 25 MCG tablet Take 1 tablet (25 mcg total) by mouth daily. 30 tablet 0   . lurasidone 120 MG TABS Take 1 tablet (120 mg total) by mouth daily with supper. (Patient not taking: No sig reported) 30 tablet 0   . meloxicam (MOBIC) 15 MG tablet Take 15 mg by mouth  daily.     . montelukast (SINGULAIR) 10 MG tablet Take 1 tablet (10 mg total) by mouth at bedtime. 30 tablet 0   . MOVANTIK 12.5 MG TABS tablet TAKE (1) TABLET BY MOUTH ONCE DAILY. 90 tablet 3   . mupirocin cream (BACTROBAN) 2 % Apply topically 3 (three) times daily. (Patient not taking: No sig reported) 15 g 0   . neomycin-bacitracin-polymyxin (NEOSPORIN) OINT Apply 1 application topically 3 (three) times daily. (Patient not taking: No sig reported) 30 g 0   . nitrofurantoin (MACRODANTIN) 50 MG capsule Take 1 capsule (50 mg total) by mouth at bedtime. 90 capsule 3   . nitrofurantoin, macrocrystal-monohydrate, (MACROBID) 100 MG capsule Take 1 capsule (100 mg total) by mouth every 12 (twelve) hours. 14 capsule 0   . ondansetron (ZOFRAN-ODT) 4 MG disintegrating tablet Take 4 mg by mouth every 8 (eight) hours as needed.     . ondansetron (ZOFRAN-ODT) 8 MG disintegrating tablet DISSOLVE (1) TABLET BY MOUTH ONCE EVERY 12 HOURS AS NEEDED FOR NAUSEA/VOMITING. (Patient not taking: No sig reported) 20 tablet 2   . polyethylene glycol (MIRALAX / GLYCOLAX) 17 g packet Take 17 g by mouth daily as needed for moderate constipation. (Patient not taking: No sig reported) 14 each 0   . potassium chloride (KLOR-CON) 10 MEQ tablet Take 1 tablet (10 mEq total) by mouth daily as needed. When taking lasix (Patient not taking: No sig reported) 30 tablet 0   . pregabalin (LYRICA) 50 MG capsule Take 1 capsule (50 mg total) by mouth 3 (three) times daily. (Patient not taking: No sig reported) 90 capsule 0   . SPIRIVA HANDIHALER 18 MCG inhalation capsule INHALE 1 CAPSULE BY MOUTHTDAILY.M     . SUBOXONE 8-2 MG FILM Take 1 Film by mouth in the morning and at bedtime.     . tamsulosin (FLOMAX) 0.4 MG CAPS capsule Take 1 capsule (0.4 mg total) by mouth daily. 30 capsule 11   . torsemide (DEMADEX) 10 MG tablet Take 1 tablet (10 mg total) by mouth daily. 30 tablet 3   . traZODone (DESYREL) 150 MG tablet Take 1 tablet (150 mg total)  by mouth at bedtime. 30 tablet 0   . Vitamin D, Ergocalciferol, (DRISDOL) 1.25 MG (50000 UNIT) CAPS capsule Take 50,000 Units by mouth once a week.       Patient Stressors: Financial difficulties Marital or family conflict Medication change or noncompliance Occupational concerns Substance abuse  Patient Strengths: Agricultural engineer for treatment/growth  Treatment Modalities: Medication Management, Group  therapy, Case management,  1 to 1 session with clinician, Psychoeducation, Recreational therapy.   Physician Treatment Plan for Primary Diagnosis: Schizoaffective disorder, bipolar type ([place]) Long Term Goal(s): Improvement in symptoms so as ready for discharge Improvement in symptoms so as ready for discharge   Short Term Goals: Ability to identify changes in lifestyle to reduce recurrence of condition will improve Ability to verbalize feelings will improve Ability to disclose and discuss suicidal ideas Ability to demonstrate self-control will improve Ability to identify and develop effective coping behaviors will improve Compliance with prescribed medications will improve Ability to identify changes in lifestyle to reduce recurrence of condition will improve Ability to verbalize feelings will improve Ability to disclose and discuss suicidal ideas Ability to demonstrate self-control will improve Ability to identify and develop effective coping behaviors will improve Compliance with prescribed medications will improve Ability to identify triggers associated with substance abuse/mental health issues will improve  Medication Management: Evaluate patient's response, side effects, and tolerance of medication regimen.  Therapeutic Interventions: 1 to 1 sessions, Unit Group sessions and Medication administration.  Evaluation of Outcomes: Progressing  Physician Treatment Plan for Secondary Diagnosis: Principal Problem:   Schizoaffective disorder, bipolar type  ([place]) Active Problems:   Tobacco abuse   Generalized anxiety disorder   GERD (gastroesophageal reflux disease)   Opioid use disorder, moderate, on maintenance therapy (HCC)   Asthma  Long Term Goal(s): Improvement in symptoms so as ready for discharge Improvement in symptoms so as ready for discharge   Short Term Goals: Ability to identify changes in lifestyle to reduce recurrence of condition will improve Ability to verbalize feelings will improve Ability to disclose and discuss suicidal ideas Ability to demonstrate self-control will improve Ability to identify and develop effective coping behaviors will improve Compliance with prescribed medications will improve Ability to identify changes in lifestyle to reduce recurrence of condition will improve Ability to verbalize feelings will improve Ability to disclose and discuss suicidal ideas Ability to demonstrate self-control will improve Ability to identify and develop effective coping behaviors will improve Compliance with prescribed medications will improve Ability to identify triggers associated with substance abuse/mental health issues will improve     Medication Management: Evaluate patient's response, side effects, and tolerance of medication regimen.  Therapeutic Interventions: 1 to 1 sessions, Unit Group sessions and Medication administration.  Evaluation of Outcomes: Progressing   RN Treatment Plan for Primary Diagnosis: Schizoaffective disorder, bipolar type ([place]) Long Term Goal(s): Knowledge of disease and therapeutic regimen to maintain health will improve  Short Term Goals: Ability to demonstrate self-control, Ability to participate in decision making will improve, Ability to identify and develop effective coping behaviors will improve and Compliance with prescribed medications will improve  Medication Management: RN will administer medications as ordered by provider, will assess and evaluate patient's response and  provide education to patient for prescribed medication. RN will report any adverse and/or side effects to prescribing provider.  Therapeutic Interventions: 1 on 1 counseling sessions, Psychoeducation, Medication administration, Evaluate responses to treatment, Monitor vital signs and CBGs as ordered, Perform/monitor CIWA, COWS, AIMS and Fall Risk screenings as ordered, Perform wound care treatments as ordered.  Evaluation of Outcomes: Progressing   LCSW Treatment Plan for Primary Diagnosis: Schizoaffective disorder, bipolar type ([place]) Long Term Goal(s): Safe transition to appropriate next level of care at discharge, Engage patient in therapeutic group addressing interpersonal concerns.  Short Term Goals: Engage patient in aftercare planning with referrals and resources, Increase social support, Increase emotional regulation, Facilitate acceptance of mental  health diagnosis and concerns, Identify triggers associated with mental health/substance abuse issues and Increase skills for wellness and recovery  Therapeutic Interventions: Assess for all discharge needs, 1 to 1 time with Social worker, Explore available resources and support systems, Assess for adequacy in community support network, Educate family and significant other(s) on suicide prevention, Complete Psychosocial Assessment, Interpersonal group therapy.  Evaluation of Outcomes: Progressing   Progress in Treatment: Attending groups: Yes. Participating in groups: Yes. Taking medication as prescribed: Yes. Toleration medication: Yes. Family/Significant other contact made: Yes, individual(s) contacted:  [name], friend Patient understands diagnosis: Yes. Discussing patient identified problems/goals with staff: Yes. Medical problems stabilized or resolved: No. Denies suicidal/homicidal ideation: Yes. Issues/concerns per patient self-inventory: Yes. Other:   New problem(s) identified: No, Describe:  No new issues identified at  this time    New Short Term/Long Term Goal(s): Engage patient in aftercare planning with referrals and resources, Increase social support, Increase emotional regulation, Facilitate acceptance of mental health diagnosis and concerns, Identify triggers associated with mental health/substance abuse issues and Increase skills for wellness and recovery Update 05/25/20: None Identified   Patient Goals:  "To be more positive than before and to get back out into society" Update 05/25/20: None identified   Discharge Plan or Barriers: CSW will assist Pt to return back to her home and assist with obtaining an appointment with outpatient mental health support. CSW will assist pt with transportation.   Update 05/25/20: Pt was making progress by attending and participating in groups. Pt's recent bizarre behavior hindered today's discharge plan.  Reason for Continuation of Hospitalization: Delusions  Depression Hallucinations Medical Issues Medication stabilization  Estimated Length of Stay: 1-7 days   Recreational Therapy: Patient Stressors: N/A Patient Goal: Patient will engage in groups without prompting or encouragement from LRT x3 group sessions within 5 recreation therapy group sessions.  Attendees: Patient: 05/25/2020 9:12 AM  Physician: Salley Scarlet, MD 05/25/2020 9:12 AM  Nursing:  05/25/2020 9:12 AM  RN Care Manager:  05/25/2020 9:12 AM  Social Worker:  05/25/2020 9:12 AM  Recreational Therapist:  05/25/2020 9:12 AM  Other: Nandini Bogdanski [place], MSW, LCSW-A 05/25/2020 9:12 AM  Other: Michell Heinrich, MSW, [place], [place] 05/25/2020 9:12 AM  Other:  05/25/2020 9:12 AM    Scribe for Treatment Team: Dakhari Zuver A [place], [place] 05/25/2020 9:12 AM

## 2020-05-25 NOTE — Progress Notes (Signed)
Recreation Therapy Notes   Date: 05/25/2020  Time: 9:30 am   Location: Craft room     Behavioral response: N/A   Intervention Topic: Time Management   Discussion/Intervention: Patient did not attend group.   Clinical Observations/Feedback:  Patient did not attend group.   Meredith [name] LRT/CTRS        Elantra Caprara 05/25/2020 12:06 PM
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
## 2020-05-26 LAB — TSH: TSH: 1.339 u[IU]/mL (ref 0.350–4.500)

## 2020-05-26 LAB — CBC WITH DIFFERENTIAL/PLATELET
Abs Immature Granulocytes: 0.01 10*3/uL (ref 0.00–0.07)
Basophils Absolute: 0 10*3/uL (ref 0.0–0.1)
Basophils Relative: 1 %
Eosinophils Absolute: 0.2 10*3/uL (ref 0.0–0.5)
Eosinophils Relative: 2 %
HCT: 45 % (ref 36.0–46.0)
Hemoglobin: 14.4 g/dL (ref 12.0–15.0)
Immature Granulocytes: 0 %
Lymphocytes Relative: 36 %
Lymphs Abs: 2.5 10*3/uL (ref 0.7–4.0)
MCH: 30.4 pg (ref 26.0–34.0)
MCHC: 32 g/dL (ref 30.0–36.0)
MCV: 94.9 fL (ref 80.0–100.0)
Monocytes Absolute: 0.6 10*3/uL (ref 0.1–1.0)
Monocytes Relative: 10 %
Neutro Abs: 3.5 10*3/uL (ref 1.7–7.7)
Neutrophils Relative %: 51 %
Platelets: 217 10*3/uL (ref 150–400)
RBC: 4.74 MIL/uL (ref 3.87–5.11)
RDW: 17 % — ABNORMAL HIGH (ref 11.5–15.5)
WBC: 6.8 10*3/uL (ref 4.0–10.5)
nRBC: 0 % (ref 0.0–0.2)

## 2020-05-26 LAB — BASIC METABOLIC PANEL
Anion gap: 7 (ref 5–15)
BUN: 11 mg/dL (ref 6–20)
CO2: 28 mmol/L (ref 22–32)
Calcium: 8.7 mg/dL — ABNORMAL LOW (ref 8.9–10.3)
Chloride: 101 mmol/L (ref 98–111)
Creatinine, Ser: 0.74 mg/dL (ref 0.44–1.00)
GFR, Estimated: >60 mL/min (ref >60)
Glucose, Bld: 112 mg/dL — ABNORMAL HIGH (ref 70–99)
Potassium: 4.4 mmol/L (ref 3.5–5.1)
Sodium: 136 mmol/L (ref 135–145)

## 2020-05-26 MED ORDER — HYDROCERIN EX CREA
TOPICAL_CREAM | Freq: Two times a day (BID) | CUTANEOUS | Status: DC
  Filled 2020-05-26: qty 113

## 2020-05-26 MED ORDER — IBUPROFEN 200 MG PO TABS
400.0000 mg | ORAL_TABLET | ORAL | Status: DC | PRN
  Administered 2020-05-26 – 2020-05-27 (×2): 400 mg via ORAL
  Filled 2020-05-26 (×2): qty 2

## 2020-05-26 MED ORDER — ZOSTER VAC RECOMB ADJUVANTED 50 MCG/0.5ML IM SUSR
0.5000 mL | Freq: Once | INTRAMUSCULAR | Status: DC
  Filled 2020-05-26: qty 0.5

## 2020-05-26 NOTE — Progress Notes (Signed)
Recreation Therapy Notes  Date: 05/26/2020  Time: 9:30 am   Location: Craft room   Behavioral response: Appropriate  Intervention Topic: Problem Solving    Discussion/Intervention:  Group content today was focused on time management. The group defined time management and identified healthy ways to manage time. Individuals expressed how much of the 24 hours they use in a day. Patients expressed how much time they use just for themselves personally. The group expressed how they have managed their time in the past. Individuals participated in the intervention "Managing Life" where they had a chance to see how much of the 24 hours they use and where it goes. Clinical Observations/Feedback: Patient came to group and defined problem solving as thinking of the situation. She stated that she problem solves by trying to get help and walking away. Participant explained that problem solving is important to sit and think and write a plan. Patient expressed that depression stops her from problem solving. Individual was social with staff while participating in the intervention. Calixto Pavel LRT/CTRS          Denina Rieger 05/26/2020 12:10 PM

## 2020-05-26 NOTE — Progress Notes (Signed)
Patient calm and cooperative during assessment denying SI/HI/AVH. Patient stated she was feeling better than this morning when she was nauseas. Patient observed interacting appropriately with staff and peers on the unit. Patient compliant with medication administration per MD orders. Pt given education, support, and encouragement to be active in her treatment plan. Patient being monitored Q 15 minutes for safety per unit protocol. Pt remains safe on the unit.

## 2020-05-26 NOTE — B[facility] Counselor (Signed)
LCSW Group Therapy Note  05/26/2020 2:34 PM  Type of Therapy/Topic:  Group Therapy:  Feelings about Diagnosis  Participation Level:  Active   Description of Group:   This group will allow patients to explore their thoughts and feelings about diagnoses they have received. Patients will be guided to explore their level of understanding and acceptance of these diagnoses. Facilitator will encourage patients to process their thoughts and feelings about the reactions of others to their diagnosis and will guide patients in identifying ways to discuss their diagnosis with significant others in their lives. This group will be process-oriented, with patients participating in exploration of their own experiences, giving and receiving support, and processing challenge from other group members.   Therapeutic Goals: 1. Patient will demonstrate understanding of diagnosis as evidenced by identifying two or more symptoms of the disorder 2. Patient will be able to express two feelings regarding the diagnosis 3. Patient will demonstrate their ability to communicate their needs through discussion and/or role play  Summary of Patient Progress: Patient was present in group.  Patient engaged in discussion on how the stigma attached to mental health has affected her.  She reports that she has felt little support from others.  She did express some apprehension at the diagnosis given her, however, was willing to understand it better to ease her negative viewpoints and concerns.    Therapeutic Modalities:   Cognitive Behavioral Therapy Brief Therapy Feelings Identification   Assunta Curtis, MSW, LCSW 05/26/2020 2:34 PM

## 2020-05-26 NOTE — Plan of Care (Signed)
Patient denies SI/HI/AVH  Problem: Education: Goal: Emotional status will improve Outcome: Progressing Goal: Mental status will improve Outcome: Progressing   

## 2020-05-26 NOTE — Plan of Care (Signed)
Patient is out of bed and stays in the milieu. Attending groups. Cooperative and denying suicidal thoughts. Denying hallucinations. Continues to report nausea and receiving Zofran. Patient is taking medications and eating well. Currently housing is the main concern. Was encouraged to discuss her situation with case management. Emotional support provided. Safety monitored as expected.
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
## 2020-05-27 DIAGNOSIS — Z720 Tobacco use: Secondary | ICD-10-CM

## 2020-05-27 DIAGNOSIS — J449 Chronic obstructive pulmonary disease, unspecified: Secondary | ICD-10-CM

## 2020-05-27 DIAGNOSIS — E039 Hypothyroidism, unspecified: Secondary | ICD-10-CM | POA: Diagnosis present

## 2020-05-27 DIAGNOSIS — N3000 Acute cystitis without hematuria: Secondary | ICD-10-CM

## 2020-05-27 DIAGNOSIS — F319 Bipolar disorder, unspecified: Secondary | ICD-10-CM

## 2020-05-27 DIAGNOSIS — T7840XA Allergy, unspecified, initial encounter: Secondary | ICD-10-CM

## 2020-05-27 MED ORDER — DIPHENHYDRAMINE HCL 25 MG PO CAPS
25.0000 mg | ORAL_CAPSULE | Freq: Three times a day (TID) | ORAL | Status: DC | PRN
  Administered 2020-05-27: 25 mg via ORAL
  Filled 2020-05-27: qty 1

## 2020-05-27 MED ORDER — LITHIUM CARBONATE 300 MG PO CAPS
300.0000 mg | ORAL_CAPSULE | Freq: Every day | ORAL | Status: DC
  Administered 2020-05-28 – 2020-05-29 (×2): 300 mg via ORAL
  Filled 2020-05-27 (×2): qty 1

## 2020-05-27 MED ORDER — HYDROXYZINE HCL 25 MG PO TABS
25.0000 mg | ORAL_TABLET | Freq: Three times a day (TID) | ORAL | Status: DC | PRN
  Administered 2020-05-27 (×2): 25 mg via ORAL
  Filled 2020-05-27 (×2): qty 1

## 2020-05-27 MED ORDER — ALBUTEROL SULFATE HFA 108 (90 BASE) MCG/ACT IN AERS
2.0000 | INHALATION_SPRAY | RESPIRATORY_TRACT | Status: DC | PRN
  Administered 2020-05-29: 2 via RESPIRATORY_TRACT
  Filled 2020-05-27: qty 6.7

## 2020-05-27 NOTE — Progress Notes (Signed)
Recreation Therapy Notes  Date: 05/27/2020  Time: 9:30 am   Location: Craft room   Behavioral response: Appropriate  Intervention Topic: Stress Management    Discussion/Intervention:  Group content on today was focused on stress. The group defined stress and way to cope with stress. Participants expressed how they know when they are stresses out. Individuals described the different ways they have to cope with stress. The group stated reasons why it is important to cope with stress. Patient explained what good stress is and some examples. The group participated in the intervention "Stress Management". Individuals were separated into two group and answered questions related to stress.  Clinical Observations/Feedback: Patient came to group and stated that she manages her stress by planting flowers and cleaning. She stated that stress management is important because it can affect your mood and anxiety. Participant expressed that her health and the weather normally stop her from managing her stress in the proper way.  Individual was social with staff while participating in the intervention. Arthurine Oleary LRT/CTRS           Halston Fairclough 05/27/2020 11:56 AM
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
## 2020-05-27 NOTE — Plan of Care (Signed)
  Problem: Coping Skills Goal: STG - Patient will identify 3 positive coping skills strategies to use post d/c within 5 recreation therapy group sessions Description: STG - Patient will identify 3 positive coping skills strategies to use post d/c within 5 recreation therapy group sessions Outcome: Progressing   

## 2020-05-27 NOTE — Plan of Care (Signed)
Patient endorses anxiety/depression  Problem: Education: Goal: Emotional status will improve Outcome: Not Progressing Goal: Mental status will improve Outcome: Not Progressing
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
## 2020-05-27 NOTE — [facility] Counselor (Signed)
CSW met with the patient regarding discharge plans.  Patient reports that she plans on calling more boarding homes and following up with family to see if she can stay with them.  If neither of those are options patient reports that she will pursue a shelter.  CSW provided information on the [employer].  Assunta Curtis, MSW, LCSW 05/27/2020 3:53 PM

## 2020-05-27 NOTE — Plan of Care (Signed)
Pt rates depression and hopelessness both at 5/10, anxiety 7/10. Pt denies SI, HI and AVH. Pt was educated on care plan and verbalizes understanding. Collier Bullock RN  Problem: Education: Goal: Knowledge of [place] [facility] Education information/materials will improve Outcome: Progressing Goal: Emotional status will improve Outcome: Progressing Goal: Mental status will improve Outcome: Progressing Goal: Verbalization of understanding the information provided will improve Outcome: Progressing   Problem: Activity: Goal: Interest or engagement in activities will improve Outcome: Progressing Goal: Sleeping patterns will improve Outcome: Progressing   Problem: Coping: Goal: Ability to verbalize frustrations and anger appropriately will improve Outcome: Progressing Goal: Ability to demonstrate self-control will improve Outcome: Progressing   Problem: Health Behavior/Discharge Planning: Goal: Identification of resources available to assist in meeting health care needs will improve Outcome: Progressing Goal: Compliance with treatment plan for underlying cause of condition will improve Outcome: Progressing   Problem: Physical Regulation: Goal: Ability to maintain clinical measurements within normal limits will improve Outcome: Progressing   Problem: Safety: Goal: Periods of time without injury will increase Outcome: Progressing

## 2020-05-27 NOTE — Progress Notes (Signed)
Patient calm and cooperative during assessment denying SI/HI/AVH. Patient stated she was feeling better than this morning when she was nauseas. Patient observed interacting appropriately with staff and peers on the unit. Patient compliant with medication administration per MD orders. Pt given education, support, and encouragement to be active in her treatment plan. Patient being monitored Q 15 minutes for safety per unit protocol. Pt remains safe on the unit.

## 2020-05-27 NOTE — Plan of Care (Signed)
New admission  Problem: Education: Goal: Knowledge of [place] Education information/materials will improve 05/27/2020 1805 by Kieth Brightly, RN Outcome: Not Progressing 05/27/2020 1049 by Kieth Brightly, RN Outcome: Progressing Goal: Emotional status will improve 05/27/2020 1805 by Kieth Brightly, RN Outcome: Not Progressing 05/27/2020 1049 by Kieth Brightly, RN Outcome: Progressing Goal: Mental status will improve 05/27/2020 1805 by Kieth Brightly, RN Outcome: Not Progressing 05/27/2020 1049 by Kieth Brightly, RN Outcome: Progressing Goal: Verbalization of understanding the information provided will improve 05/27/2020 1805 by Kieth Brightly, RN Outcome: Not Progressing 05/27/2020 1049 by Kieth Brightly, RN Outcome: Progressing   Problem: Activity: Goal: Interest or engagement in activities will improve 05/27/2020 1805 by Kieth Brightly, RN Outcome: Not Progressing 05/27/2020 1049 by Kieth Brightly, RN Outcome: Progressing Goal: Sleeping patterns will improve 05/27/2020 1805 by Kieth Brightly, RN Outcome: Not Progressing 05/27/2020 1049 by Kieth Brightly, RN Outcome: Progressing   Problem: Coping: Goal: Ability to verbalize frustrations and anger appropriately will improve 05/27/2020 1805 by Kieth Brightly, RN Outcome: Not Progressing 05/27/2020 1049 by Kieth Brightly, RN Outcome: Progressing Goal: Ability to demonstrate self-control will improve 05/27/2020 1805 by Kieth Brightly, RN Outcome: Not Progressing 05/27/2020 1049 by Kieth Brightly, RN Outcome: Progressing   Problem: Health Behavior/Discharge Planning: Goal: Identification of resources available to assist in meeting health care needs will improve 05/27/2020 1805 by Kieth Brightly, RN Outcome: Not Progressing 05/27/2020 1049 by Kieth Brightly, RN Outcome: Progressing Goal: Compliance with treatment plan for underlying cause of condition will improve 05/27/2020 1805 by Kieth Brightly,  RN Outcome: Not Progressing 05/27/2020 1049 by Kieth Brightly, RN Outcome: Progressing   Problem: Physical Regulation: Goal: Ability to maintain clinical measurements within normal limits will improve 05/27/2020 1805 by Kieth Brightly, RN Outcome: Not Progressing 05/27/2020 1049 by Kieth Brightly, RN Outcome: Progressing   Problem: Safety: Goal: Periods of time without injury will increase 05/27/2020 1805 by Kieth Brightly, RN Outcome: Not Progressing 05/27/2020 1049 by Kieth Brightly, RN Outcome: Progressing

## 2020-05-28 LAB — CBC
HCT: 46 % (ref 36.0–46.0)
Hemoglobin: 14.7 g/dL (ref 12.0–15.0)
MCH: 30.1 pg (ref 26.0–34.0)
MCHC: 32 g/dL (ref 30.0–36.0)
MCV: 94.3 fL (ref 80.0–100.0)
Platelets: 257 10*3/uL (ref 150–400)
RBC: 4.88 MIL/uL (ref 3.87–5.11)
RDW: 16.2 % — ABNORMAL HIGH (ref 11.5–15.5)
WBC: 8.8 10*3/uL (ref 4.0–10.5)
nRBC: 0 % (ref 0.0–0.2)

## 2020-05-28 LAB — LITHIUM LEVEL: Lithium Lvl: 0.12 mmol/L — ABNORMAL LOW (ref 0.60–1.20)

## 2020-05-28 MED ORDER — BUSPIRONE HCL 15 MG PO TABS: 15.0000 mg | ORAL_TABLET | Freq: Two times a day (BID) | ORAL | 1 refills | Status: DC

## 2020-05-28 MED ORDER — HYDROXYZINE HCL 50 MG PO TABS: 50.0000 mg | ORAL_TABLET | Freq: Three times a day (TID) | ORAL | 1 refills | Status: DC | PRN

## 2020-05-28 MED ORDER — ARIPIPRAZOLE 20 MG PO TABS: 20.0000 mg | ORAL_TABLET | Freq: Every day | ORAL | 1 refills | Status: DC

## 2020-05-28 MED ORDER — LITHIUM CARBONATE 300 MG PO CAPS: 300.0000 mg | ORAL_CAPSULE | Freq: Every day | ORAL | 1 refills | Status: DC

## 2020-05-28 MED ORDER — TRAZODONE HCL 150 MG PO TABS
150.0000 mg | ORAL_TABLET | Freq: Every evening | ORAL | 1 refills | Status: DC | PRN
Start: 2020-05-28 — End: 2021-07-22

## 2020-05-28 MED ORDER — HYDROXYZINE HCL 50 MG PO TABS
50.0000 mg | ORAL_TABLET | Freq: Three times a day (TID) | ORAL | Status: DC | PRN
  Administered 2020-05-28: 50 mg via ORAL
  Filled 2020-05-28 (×2): qty 1
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
## 2020-05-28 NOTE — [facility] Group Notes (Signed)
LCSW Group Therapy Note     05/28/2020 2:21 PM     Type of Therapy/Topic:  Group Therapy:  Balance in Life     Participation Level:  Did Not Attend     Description of Group:    This group will address the concept of balance and how it feels and looks when one is unbalanced. Patients will be encouraged to process areas in their lives that are out of balance and identify reasons for remaining unbalanced. Facilitators will guide patients in utilizing problem-solving interventions to address and correct the stressor making their life unbalanced. Understanding and applying boundaries will be explored and addressed for obtaining and maintaining a balanced life. Patients will be encouraged to explore ways to assertively make their unbalanced needs known to significant others in their lives, using other group members and facilitator for support and feedback.     Therapeutic Goals:  1.      Patient will identify two or more emotions or situations they have that consume much of in their lives.  2.      Patient will identify signs/triggers that life has become out of balance:  3.      Patient will identify two ways to set boundaries in order to achieve balance in their lives:  4.      Patient will demonstrate ability to communicate their needs through discussion and/or role plays     Summary of Patient Progress: X      Therapeutic Modalities:   Cognitive Behavioral Therapy  Solution-Focused Therapy  Assertiveness Training     Mikailah Morel [place] MSW, [place]  05/28/2020 2:21 PM

## 2020-05-28 NOTE — Plan of Care (Addendum)
Pt rates depression 6/10, hopelessness 5/10 and anxiety 7/10. Pt denies SI, HI and VH but has AH. Pt was educated on care plan and verbalizes understanding. Collier Bullock RN Problem: Education: Goal: Knowledge of [place] [facility] Education information/materials will improve Outcome: Progressing Goal: Emotional status will improve Outcome: Progressing Goal: Mental status will improve Outcome: Progressing Goal: Verbalization of understanding the information provided will improve Outcome: Progressing   Problem: Activity: Goal: Interest or engagement in activities will improve Outcome: Progressing Goal: Sleeping patterns will improve Outcome: Progressing   Problem: Coping: Goal: Ability to verbalize frustrations and anger appropriately will improve Outcome: Progressing Goal: Ability to demonstrate self-control will improve Outcome: Progressing   Problem: Health Behavior/Discharge Planning: Goal: Identification of resources available to assist in meeting health care needs will improve Outcome: Progressing Goal: Compliance with treatment plan for underlying cause of condition will improve Outcome: Progressing   Problem: Physical Regulation: Goal: Ability to maintain clinical measurements within normal limits will improve Outcome: Progressing   Problem: Safety: Goal: Periods of time without injury will increase Outcome: Progressing

## 2020-05-28 NOTE — [facility] Group Notes (Signed)
[place] Group Notes:  (Nursing/MHT/Case Management/Adjunct)  Date:  05/28/2020  Time:  9:32 PM  Type of Therapy:  Group Therapy  Participation Level:  Did Not Attend  Meredith [name] 05/28/2020, 9:32 PM

## 2020-05-28 NOTE — Progress Notes (Signed)
PROGRESS NOTE    Meredith [name]  QJF:[id number] DOB: [birth year] DOA: 05/18/2020 PCP: Jani Gravel, MD   Brief Narrative: This 54 years old female with PMH significant for schizoaffective disorder, bipolar type, tobacco abuse, generalized anxiety disorder, GERD, bipolar 1 disorder opioid use disorder moderate on maintenance therapy, asthma, COPD and hypothyroidism who was admitted to behavioral health on 05/19/2020.  We were consulted for localized allergic reaction.  There were no signs of cellulitis.  Assessment & Plan:   Principal Problem:   Schizoaffective disorder, bipolar type ([place]) Active Problems:   Tobacco abuse   Generalized anxiety disorder   GERD (gastroesophageal reflux disease)   Bipolar 1 disorder (HCC)   Acute cystitis without hematuria   Opioid use disorder, moderate, on maintenance therapy (HCC)   Asthma   COPD (chronic obstructive pulmonary disease) (HCC)   Hypothyroidism   Allergic reaction   Schizoaffective disorder, bipolar type, generalized anxiety disorder, bipolar 1 disorder : Patient denies suicidal homicidal ideations. -management per primary team of psychiatry  Possible allergic reaction:  Patient has erythematous change in both lower legs.  On examination,  It looks like erythematous rash.   Patient has itching and mild tenderness.  Patient does not have fever or leukocytosis.   Clinically does not seem to have cellulitis.  It is likely to be allergic reaction, may be related to Bactrim use. -Hold off antibiotics now. -Blood cultures no growth so far. -Need to reevaluate in the morning.  If patient does not develop fever or leukocytosis, will continue to hold antibiotics. - Continue as needed Benadryl  Tobacco abuse -Nicotine patch  GERD (gastroesophageal reflux disease) -Protonix  Acute cystitis without hematuria: Patient finished 5-day course of Bactrim.  Denies symptoms of UTI -Observe closely.  She denies any UTI symptoms.  Opioid use  disorder, moderate, on maintenance therapy ([place]) -On Suboxone.  Asthma and COPD (chronic obstructive pulmonary disease) ([place]): Stable -Bronchodilators, as needed albuterol  Hypothyroidism: Patient used to take Synthroid, currently not taking his medications.  TSH 1.339 yesterday. -f/u with PCP   Subjective: Patient was seen and examined at bedside.  Overnight events noted.   Patient denies pain or fever.  LE redness seems allergic and it is improving.  Objective: Vitals:   05/25/20 0646 05/26/20 0626 05/27/20 0627 05/28/20 0629  BP: (!) 104/92 103/62 105/71 119/82  Pulse: 79 68 66 69  Resp: 17 17 18    Temp: 98 F (36.7 C) 97.9 F (36.6 C) 97.9 F (36.6 C) 98.4 F (36.9 C)  TempSrc: Oral Oral Oral Oral  SpO2: 100% 97% 98% 97%  Weight:      Height:       No intake or output data in the 24 hours ending 05/28/20 1343 Filed Weights   05/18/20 2138  Weight: 65.8 kg    Examination:  General exam: Appears calm and comfortable. Not in any acute distress.  Respiratory system: Clear to auscultation. Respiratory effort normal. Cardiovascular system: S1 & S2 heard, RRR. No JVD, murmurs, rubs, gallops or clicks. No pedal edema. Gastrointestinal system: Abdomen is nondistended, soft and nontender. No organomegaly or masses felt. Normal bowel sounds heard. Central nervous system: Alert and oriented. No focal neurological deficits. Extremities: Symmetric 5 x 5 power.  Bilateral lower extremity redness does not seems cellulitis and improving. Psychiatry: Judgement and insight appear normal. Mood & affect appropriate.     Data Reviewed: I have personally reviewed following labs and imaging studies  CBC: Recent Labs  Lab 05/26/20 0634 05/28/20 0846  WBC 6.8 8.8  NEUTROABS 3.5  --   HGB 14.4 14.7  HCT 45.0 46.0  MCV 94.9 94.3  PLT 217 366   Basic Metabolic Panel: Recent Labs  Lab 05/26/20 0634  NA 136  K 4.4  CL 101  CO2 28  GLUCOSE 112*  BUN 11  CREATININE 0.74   CALCIUM 8.7*   GFR: Estimated Creatinine Clearance: 70.6 mL/min (by C-G formula based on SCr of 0.74 mg/dL). Liver Function Tests: No results for input(s): AST, ALT, ALKPHOS, BILITOT, PROT, ALBUMIN in the last 168 hours. No results for input(s): LIPASE, AMYLASE in the last 168 hours. No results for input(s): AMMONIA in the last 168 hours. Coagulation Profile: No results for input(s): INR, PROTIME in the last 168 hours. Cardiac Enzymes: No results for input(s): CKTOTAL, CKMB, CKMBINDEX, TROPONINI in the last 168 hours. BNP (last 3 results) No results for input(s): PROBNP in the last 8760 hours. HbA1C: No results for input(s): HGBA1C in the last 72 hours. CBG: No results for input(s): GLUCAP in the last 168 hours. Lipid Profile: No results for input(s): CHOL, HDL, LDLCALC, TRIG, CHOLHDL, LDLDIRECT in the last 72 hours. Thyroid Function Tests: Recent Labs    05/26/20 0634  TSH 1.339   Anemia Panel: No results for input(s): VITAMINB12, FOLATE, FERRITIN, TIBC, IRON, RETICCTPCT in the last 72 hours. Sepsis Labs: No results for input(s): PROCALCITON, LATICACIDVEN in the last 168 hours.  Recent Results (from the past 240 hour(s))  Urine Culture     Status: Abnormal   Collection Time: 05/21/20 11:16 AM   Specimen: Urine, Clean Catch  Result Value Ref Range Status   Specimen Description   Final    URINE, CLEAN CATCH Performed at [facility], [address], [place], [place] [address]    Special Requests   Final    NONE Performed at [facility], [address] [place], [place] [address]    Culture (A)  Final    >=100,000 COLONIES/mL LACTOBACILLUS SPECIES Standardized susceptibility testing for this organism is not available. Performed at [facility], [place] [address], [place], [place] [address]    Report Status 05/23/2020 FINAL  Final  CULTURE, BLOOD (ROUTINE X 2) w Reflex to ID Panel     Status: None (Preliminary result)   Collection Time:  05/27/20  1:13 PM   Specimen: BLOOD  Result Value Ref Range Status   Specimen Description BLOOD RIGHT ANTECUBITAL  Final   Special Requests   Final    BOTTLES DRAWN AEROBIC AND ANAEROBIC Blood Culture adequate volume   Culture   Final    NO GROWTH < 24 HOURS Performed at [facility], [address], [place], [place] [address]    Report Status PENDING  Incomplete  CULTURE, BLOOD (ROUTINE X 2) w Reflex to ID Panel     Status: None (Preliminary result)   Collection Time: 05/27/20  1:19 PM   Specimen: BLOOD  Result Value Ref Range Status   Specimen Description BLOOD BLOOD LEFT ARM  Final   Special Requests   Final    BOTTLES DRAWN AEROBIC AND ANAEROBIC Blood Culture adequate volume   Culture   Final    NO GROWTH < 24 HOURS Performed at [facility], [address], [place], [place] [address]    Report Status PENDING  Incomplete    Radiology Studies: No results found.  Scheduled Meds: . ARIPiprazole  20 mg Oral Daily  . buprenorphine-naloxone  1 tablet Sublingual BID  .  busPIRone  15 mg Oral BID  . clotrimazole   Topical BID  . hydrocerin   Topical BID  . lithium carbonate  300 mg Oral Daily  . mirtazapine  7.5 mg Oral QHS  . pantoprazole  40 mg Oral Daily  . polyethylene glycol  17 g Oral Daily  . torsemide  10 mg Oral Daily  . Zoster Vaccine Adjuvanted  0.5 mL Intramuscular Once   Continuous Infusions:   LOS: 10 days    Time spent: 25 mins    Shawna Clamp, MD [facility]   If 7PM-7AM, please contact night-coverage

## 2020-05-28 NOTE — BHH Group Notes (Signed)
[place] Group Notes:  (Nursing/MHT/Case Management/Adjunct)  Date:  05/28/2020  Time:  9:08 AM  Type of Therapy:  Community Meeting  Participation Level:  Active  Participation Quality:  Appropriate and Attentive  Affect:  Appropriate  Cognitive:  Alert and Appropriate  Insight:  Appropriate  Engagement in Group:  Engaged  Modes of Intervention:  Discussion, Education and Support  Summary of Progress/Problems:  Adela Lank [facility] 05/28/2020, 9:08 AM

## 2020-05-29 DIAGNOSIS — F250 Schizoaffective disorder, bipolar type: Principal | ICD-10-CM

## 2020-05-29 MED ORDER — HYDROCERIN EX CREA: 1.0000 "application " | TOPICAL_CREAM | Freq: Two times a day (BID) | CUTANEOUS | 1 refills | Status: DC

## 2020-05-29 NOTE — Progress Notes (Signed)
Recreation Therapy Notes  Date: 05/29/2020  Time: 9:30 am   Location: Craft room   Behavioral response: Appropriate  Intervention Topic: Happiness   Discussion/Intervention:  Group content today was focused on Happiness. The group defined happiness and described where happiness comes from. Individuals identified what makes them happy and how they go about making others happy. Patients expressed things that stop them from being happy and ways they can improve their happiness. The group stated reasons why it is important to be happy. The group participated in the intervention "My Happiness", where they had a chance to identify and express things that make them happy. Clinical Observations/Feedback: Patient came to group and defined happiness as love and feeling good. Individual was social with staff while participating in the intervention. Shaverence Outlaw LRT/CTRS         Shaverence  Outlaw 05/29/2020 11:28 AM

## 2020-05-29 NOTE — BHH Group Notes (Signed)
[facility] [facility] Group Notes:  (Nursing/MHT/Case Management/Adjunct)  Date:  05/29/2020  Time:  9:41 AM  Type of Therapy:  Community Meeting  Participation Level:  Active  Participation Quality:  Appropriate  Affect:  Appropriate  Cognitive:  Alert and Appropriate  Insight:  Appropriate  Engagement in Group:  Engaged  Modes of Intervention:  Activity and Discussion  Summary of Progress/Problems:  Meredith [name] [facility] 05/29/2020, 9:41 AM

## 2020-05-29 NOTE — BHH Suicide Risk Assessment (Signed)
[facility] Discharge Suicide Risk Assessment   Principal Problem: Schizoaffective disorder, bipolar type [facility]) Discharge Diagnoses: Principal Problem:   Schizoaffective disorder, bipolar type ([place]) Active Problems:   Tobacco abuse   Generalized anxiety disorder   GERD (gastroesophageal reflux disease)   Bipolar 1 disorder (HCC)   Acute cystitis without hematuria   Opioid use disorder, moderate, on maintenance therapy (HCC)   Asthma   COPD (chronic obstructive pulmonary disease) (HCC)   Hypothyroidism   Allergic reaction   Total Time spent with patient: 35 minutes- 25 minutes face-to-face contact with patient, 10 minutes documentation, coordination of care, scripts   Musculoskeletal: Strength & Muscle Tone: within normal limits Gait & Station: normal Patient leans: N/A  Psychiatric Specialty Exam: Review of Systems  Constitutional: Negative for appetite change and fatigue.  HENT: Negative for rhinorrhea and sore throat.   Eyes: Negative for photophobia and visual disturbance.  Respiratory: Negative for cough and shortness of breath.   Cardiovascular: Negative for chest pain and palpitations.  Gastrointestinal: Negative for constipation, diarrhea, nausea and vomiting.  Endocrine: Negative for cold intolerance and heat intolerance.  Genitourinary: Negative for difficulty urinating and dysuria.  Musculoskeletal: Negative for arthralgias and myalgias.  Skin: Negative for rash and wound.  Allergic/Immunologic: Negative for environmental allergies and food allergies.  Neurological: Negative for dizziness and headaches.  Hematological: Negative for adenopathy. Does not bruise/bleed easily.  Psychiatric/Behavioral: Negative for dysphoric mood, hallucinations and suicidal ideas. The patient is not nervous/anxious.     Blood pressure 103/71, pulse 71, temperature 98.3 F (36.8 C), temperature source Oral, resp. rate 18, height 5\' 1"  (1.549 m), weight 65.8 kg, SpO2 98 %.Body mass index is  27.4 kg/m.  General Appearance: Well Groomed  Eye Contact::  Good  Speech:  Clear and Coherent and Normal Rate409  Volume:  Normal  Mood:  Euthymic  Affect:  Congruent  Thought Process:  Coherent and Linear  Orientation:  Full (Time, Place, and Person)  Thought Content:  Logical  Suicidal Thoughts:  No  Homicidal Thoughts:  No  Memory:  Immediate;   Fair Recent;   Fair Remote;   Fair  Judgement:  Intact  Insight:  Present  Psychomotor Activity:  Normal  Concentration:  Fair  Recall:  [employer] of Knowledge:Fair  Language: Fair  Akathisia:  Negative  Handed:  Right  AIMS (if indicated):     Assets:  Communication Skills Desire for Improvement Financial Resources/Insurance Housing Intimacy Social Support  Sleep:  Number of Hours: 8  Cognition: WNL  ADL's:  Intact   Mental Status Per Nursing Assessment::   On Admission:  NA  Demographic Factors:  Caucasian  Loss Factors: NA  Historical Factors: Prior suicide attempts, Family history of mental illness or substance abuse and Impulsivity  Risk Reduction Factors:   Sense of responsibility to family, Religious beliefs about death, Living with another person, especially a relative, Positive social support, Positive therapeutic relationship and Positive coping skills or problem solving skills  Continued Clinical Symptoms:  Schizophrenia:   Paranoid or undifferentiated type Chronic Pain Previous Psychiatric Diagnoses and Treatments Medical Diagnoses and Treatments/Surgeries  Cognitive Features That Contribute To Risk:  None    Suicide Risk:  Mild:  Suicidal ideation of limited frequency, intensity, duration, and specificity.  There are no identifiable plans, no associated intent, mild dysphoria and related symptoms, good self-control (both objective and subjective assessment), few other risk factors, and identifiable protective factors, including available and accessible social support.   Follow-up Information  [facility] Follow [facility].   Why: Appointment is scheduled for 06/03/2020 at 11:30AM with Dr. Loni Muse, [place] unavailable immediately, will be able to transition back to her. Thanks! Contact information: [address] [place] [address] [phone]               Plan Of Care/Follow-up recommendations:  Activity:  as tolerated Diet:  regular diet  Salley Scarlet, MD 05/29/2020, 11:32 AM

## 2020-05-29 NOTE — Plan of Care (Signed)
  Problem: Education: Goal: Knowledge of [facility] Education information/materials will improve Outcome: Progressing Goal: Emotional status will improve Outcome: Progressing Goal: Mental status will improve Outcome: Progressing Goal: Verbalization of understanding the information provided will improve Outcome: Progressing   Problem: Activity: Goal: Interest or engagement in activities will improve Outcome: Progressing   

## 2020-05-29 NOTE — Progress Notes (Signed)
Patient isolative to self and room. Did not come out for snacks or meds. Writer had to deliver meds to room. Pt reports feeling a little better, just felt a little bad today. Reports having nausea and upset stomach. Pt request prn for sleep. Given with good relief. Denies SI, HI, AVH. Endorses depression and anxiety. Encouragement and support provided. Safety checks maintained. Medications given as prescribed. Pt receptive and remains safe on unit with q 15 min checks.

## 2020-05-29 NOTE — Progress Notes (Signed)
PROGRESS NOTE    Meredith [name]  MEQ:[id number] DOB: 1966/02/11 DOA: 05/18/2020 PCP: Jani Gravel, MD   Brief Narrative: This 55 years old female with PMH significant for schizoaffective disorder, bipolar type, tobacco abuse, generalized anxiety disorder, GERD, bipolar 1 disorder opioid use disorder moderate on maintenance therapy, asthma, COPD and hypothyroidism who was admitted to behavioral health on 05/19/2020.  We were consulted for localized allergic reaction.  There were no signs of cellulitis.  Assessment & Plan:   Principal Problem:   Schizoaffective disorder, bipolar type (HCC) Active Problems:   Tobacco abuse   Generalized anxiety disorder   GERD (gastroesophageal reflux disease)   Bipolar 1 disorder (HCC)   Opioid use disorder, moderate, on maintenance therapy (HCC)   Asthma   COPD (chronic obstructive pulmonary disease) (HCC)   Hypothyroidism   Allergic reaction   Schizoaffective disorder, bipolar type, generalized anxiety disorder, bipolar 1 disorder : Patient denies suicidal/ homicidal ideations. -management per primary team of psychiatry.  Possible allergic reaction: > Improved. Patient has erythematous changes in both lower legs.  On examination,  It looks like erythematous rash. Patient has itching and mild tenderness.  Patient does not have fever or leukocytosis.   Clinically does not seem to have cellulitis.  It is likely to be allergic reaction, may be related to Bactrim use. -Hold off antibiotics now. -Blood cultures no growth so far. -Need to reevaluate in the morning.  If patient does not develop fever or leukocytosis, will continue to hold antibiotics. - Continue as needed Benadryl  Tobacco abuse -Nicotine patch  GERD (gastroesophageal reflux disease) -Protonix  Acute cystitis without hematuria:  Patient finished 5-day course of Bactrim.  Denies symptoms of UTI -Observe closely.  She denies any UTI symptoms.  Opioid use disorder, moderate, on  maintenance therapy ([place]) -On Suboxone.  Asthma and COPD (chronic obstructive pulmonary disease) ([place]): Stable -Bronchodilators, as needed albuterol  Hypothyroidism: Patient used to take Synthroid, currently not taking his medications.  TSH 1.339 yesterday. -f/u with PCP  We will sign off. Reconsult if needed.    Subjective: Patient was seen and examined at bedside.  Overnight events noted.   Patient denies pain or fever.  LE redness seems allergic and it has improved. She reports going home today.  Objective: Vitals:   05/26/20 0626 05/27/20 0627 05/28/20 0629 05/29/20 0630  BP: 103/62 105/71 119/82 103/71  Pulse: 68 66 69 71  Resp: 17 18    Temp: 97.9 F (36.6 C) 97.9 F (36.6 C) 98.4 F (36.9 C) 98.3 F (36.8 C)  TempSrc: Oral Oral Oral Oral  SpO2: 97% 98% 97% 98%  Weight:      Height:       No intake or output data in the 24 hours ending 05/29/20 1215 Filed Weights   05/18/20 2138  Weight: 65.8 kg    Examination:  General exam: Appears calm and comfortable. Not in any acute distress.  Respiratory system: Clear to auscultation. Respiratory effort normal. Cardiovascular system: S1 & S2 heard, RRR. No JVD, murmurs, rubs, gallops or clicks. No pedal edema. Gastrointestinal system: Abdomen is nondistended, soft and nontender. No organomegaly or masses felt. Normal bowel sounds heard. Central nervous system: Alert and oriented. No focal neurological deficits. Extremities: Symmetric 5 x 5 power.  Bilateral lower extremity redness does not seems cellulitis and improving. Psychiatry: Judgement and insight appear normal. Mood & affect appropriate.     Data Reviewed: I have personally reviewed following labs and imaging studies  CBC: Recent Labs  Lab 05/26/20 0634 05/28/20 0846  WBC 6.8 8.8  NEUTROABS 3.5  --   HGB 14.4 14.7  HCT 45.0 46.0  MCV 94.9 94.3  PLT 217 976   Basic Metabolic Panel: Recent Labs  Lab 05/26/20 0634  NA 136  K 4.4  CL 101  CO2  28  GLUCOSE 112*  BUN 11  CREATININE 0.74  CALCIUM 8.7*   GFR: Estimated Creatinine Clearance: 70.6 mL/min (by C-G formula based on SCr of 0.74 mg/dL). Liver Function Tests: No results for input(s): AST, ALT, ALKPHOS, BILITOT, PROT, ALBUMIN in the last 168 hours. No results for input(s): LIPASE, AMYLASE in the last 168 hours. No results for input(s): AMMONIA in the last 168 hours. Coagulation Profile: No results for input(s): INR, PROTIME in the last 168 hours. Cardiac Enzymes: No results for input(s): CKTOTAL, CKMB, CKMBINDEX, TROPONINI in the last 168 hours. BNP (last 3 results) No results for input(s): PROBNP in the last 8760 hours. HbA1C: No results for input(s): HGBA1C in the last 72 hours. CBG: No results for input(s): GLUCAP in the last 168 hours. Lipid Profile: No results for input(s): CHOL, HDL, LDLCALC, TRIG, CHOLHDL, LDLDIRECT in the last 72 hours. Thyroid Function Tests: No results for input(s): TSH, T4TOTAL, FREET4, T3FREE, THYROIDAB in the last 72 hours. Anemia Panel: No results for input(s): VITAMINB12, FOLATE, FERRITIN, TIBC, IRON, RETICCTPCT in the last 72 hours. Sepsis Labs: No results for input(s): PROCALCITON, LATICACIDVEN in the last 168 hours.  Recent Results (from the past 240 hour(s))  Urine Culture     Status: Abnormal   Collection Time: 05/21/20 11:16 AM   Specimen: Urine, Clean Catch  Result Value Ref Range Status   Specimen Description   Final    URINE, CLEAN CATCH Performed at [facility], [address] [place], [place] [address]    Special Requests   Final    NONE Performed at [facility], [address] [place], [place] [address]    Culture (A)  Final    >=100,000 COLONIES/mL LACTOBACILLUS SPECIES Standardized susceptibility testing for this organism is not available. Performed at [facility], [place] [address] [place], [place] [address]    Report Status 05/23/2020 FINAL  Final  CULTURE, BLOOD (ROUTINE X 2)  w Reflex to ID Panel     Status: None (Preliminary result)   Collection Time: 05/27/20  1:13 PM   Specimen: BLOOD  Result Value Ref Range Status   Specimen Description BLOOD RIGHT ANTECUBITAL  Final   Special Requests   Final    BOTTLES DRAWN AEROBIC AND ANAEROBIC Blood Culture adequate volume   Culture   Final    NO GROWTH 2 DAYS Performed at [facility], [address], [place], [place] [address]    Report Status PENDING  Incomplete  CULTURE, BLOOD (ROUTINE X 2) w Reflex to ID Panel     Status: None (Preliminary result)   Collection Time: 05/27/20  1:19 PM   Specimen: BLOOD  Result Value Ref Range Status   Specimen Description BLOOD BLOOD LEFT ARM  Final   Special Requests   Final    BOTTLES DRAWN AEROBIC AND ANAEROBIC Blood Culture adequate volume   Culture   Final    NO GROWTH 2 DAYS Performed at [facility], [address] [place],  [address]    Report Status PENDING  Incomplete    Radiology Studies: No results found.  Scheduled Meds: . ARIPiprazole  20 mg Oral Daily  . buprenorphine-naloxone  1 tablet Sublingual BID  . busPIRone  15 mg Oral BID  . clotrimazole   Topical BID  . hydrocerin   Topical BID  . lithium carbonate  300 mg Oral Daily  . mirtazapine  7.5 mg Oral QHS  . pantoprazole  40 mg Oral Daily  . polyethylene glycol  17 g Oral Daily  . torsemide  10 mg Oral Daily  . Zoster Vaccine Adjuvanted  0.5 mL Intramuscular Once   Continuous Infusions:   LOS: 11 days    Time spent: 25 mins    Shawna Clamp, MD [facility]   If 7PM-7AM, please contact night-coverage

## 2020-05-29 NOTE — Plan of Care (Signed)
  Problem: Coping Skills Goal: STG - Patient will identify 3 positive coping skills strategies to use post d/c within 5 recreation therapy group sessions Description: STG - Patient will identify 3 positive coping skills strategies to use post d/c within 5 recreation therapy group sessions Outcome: Completed/Met
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
## 2020-05-29 NOTE — Progress Notes (Signed)
  [facility] Adult Case Management Discharge Plan :  Will you be returning to the same living situation after discharge:  Yes,  fiance's home At discharge, do you have transportation home?: Yes,  fiance Do you have the ability to pay for your medications: Yes,  Medicaid  Release of information consent forms completed and in the chart;  Patient's signature needed at discharge.  Patient to Follow up at:  [place] Follow up.   Why: Appointment is scheduled for 06/03/2020 at 11:30AM with Dr. Loni Muse, [facility] unavailable immediately, will be able to transition back to her. Thanks! Contact information: [name] [address] [phone]               Next level of care provider has access to [place] and Suicide Prevention discussed: Yes,  Meredith [name], fiance     Has patient been referred to the Quitline?: N/A patient is not a smoker  Patient has been referred for addiction treatment: N/A  Meredith [name], Meredith [name] 05/29/2020, 11:31 AM

## 2020-05-29 NOTE — Progress Notes (Signed)
Recreation Therapy Notes  INPATIENT RECREATION TR PLAN  Patient Details Name: Meredith [name] MRN: [id number] DOB: 01/10/1966 Today's Date: 05/29/2020  Rec Therapy Plan Is patient appropriate for Therapeutic Recreation?: Yes Treatment times per week: at least 3 Estimated Length of Stay: 5-7 days TR Treatment/Interventions: Group participation (Comment)  Discharge Criteria Pt will be discharged from therapy if:: Discharged Treatment plan/goals/alternatives discussed and agreed upon by:: Patient/family  Discharge Summary Short term goals set: Patient will identify 3 positive coping skills strategies to use post d/c within 5 recreation therapy group sessions Short term goals met: Complete Progress toward goals comments: Groups attended Which groups?: Stress management,AAA/T,Other (Comment) (Happiness, Problem Solving) Reason goals not met: N/A Therapeutic equipment acquired: N/A Reason patient discharged from therapy: Discharge from hospital Pt/family agrees with progress & goals achieved: Yes Date patient discharged from therapy: 05/29/20   Shaverence  Outlaw 05/29/2020, 3:10 PM  

## 2020-05-29 NOTE — Plan of Care (Signed)
Pt rates depression 5/10, hopelessness 6/10 and anxiety 7/10. Pt was educated on care plan and verbalizes understanding. Pt anticipates discharge. Collier Bullock RN Problem: Education: Goal: Knowledge of [place] [facility] Education information/materials will improve Outcome: Adequate for Discharge Goal: Emotional status will improve Outcome: Adequate for Discharge Goal: Mental status will improve Outcome: Adequate for Discharge Goal: Verbalization of understanding the information provided will improve Outcome: Adequate for Discharge   Problem: Activity: Goal: Interest or engagement in activities will improve Outcome: Adequate for Discharge Goal: Sleeping patterns will improve Outcome: Adequate for Discharge   Problem: Coping: Goal: Ability to verbalize frustrations and anger appropriately will improve Outcome: Adequate for Discharge Goal: Ability to demonstrate self-control will improve Outcome: Adequate for Discharge   Problem: Health Behavior/Discharge Planning: Goal: Identification of resources available to assist in meeting health care needs will improve Outcome: Adequate for Discharge Goal: Compliance with treatment plan for underlying cause of condition will improve Outcome: Adequate for Discharge   Problem: Physical Regulation: Goal: Ability to maintain clinical measurements within normal limits will improve Outcome: Adequate for Discharge   Problem: Safety: Goal: Periods of time without injury will increase Outcome: Adequate for Discharge

## 2020-05-29 NOTE — Progress Notes (Signed)
Pt denies SI, HI and AVH. Pt was educated on dc plan and verbalizes understanding. Pt received medications, belongings, and money ($52).  Collier Bullock RN

## 2020-06-01 LAB — CULTURE, BLOOD (ROUTINE X 2)
Culture: NO GROWTH
Culture: NO GROWTH
Special Requests: ADEQUATE
Special Requests: ADEQUATE

## 2020-06-04 ENCOUNTER — Other Ambulatory Visit: Payer: Self-pay

## 2020-06-04 ENCOUNTER — Ambulatory Visit (HOSPITAL_COMMUNITY)
Admission: RE | Admit: 2020-06-04 | Discharge: 2020-06-04 | Disposition: A | Discharge status: Home / Self Care | Payer: Medicaid Other | Source: Ambulatory Visit | Attending: Urology | Admitting: Urology

## 2020-06-04 DIAGNOSIS — N200 Calculus of kidney: Secondary | ICD-10-CM | POA: Insufficient documentation

## 2020-06-04 DIAGNOSIS — N3000 Acute cystitis without hematuria: Secondary | ICD-10-CM | POA: Diagnosis present

## 2020-06-05 ENCOUNTER — Other Ambulatory Visit (INDEPENDENT_AMBULATORY_CARE_PROVIDER_SITE_OTHER): Payer: Self-pay | Admitting: Gastroenterology

## 2020-06-05 DIAGNOSIS — R112 Nausea with vomiting, unspecified: Secondary | ICD-10-CM

## 2020-06-08 NOTE — Telephone Encounter (Signed)
Last seen for Gerd on 01/06/2020 and has next appt with Dr. Jenetta Downer 01/07/2021

## 2020-06-22 ENCOUNTER — Ambulatory Visit (INDEPENDENT_AMBULATORY_CARE_PROVIDER_SITE_OTHER): Payer: Medicaid Other | Admitting: Gastroenterology

## 2020-06-22 ENCOUNTER — Other Ambulatory Visit: Payer: Self-pay

## 2020-06-22 ENCOUNTER — Encounter (INDEPENDENT_AMBULATORY_CARE_PROVIDER_SITE_OTHER): Payer: Self-pay | Admitting: Gastroenterology

## 2020-06-22 VITALS — BP 127/76 | HR 74 | Temp 97.9°F | Ht 61.0 in | Wt 155.0 lb

## 2020-06-22 DIAGNOSIS — K625 Hemorrhage of anus and rectum: Secondary | ICD-10-CM

## 2020-06-22 DIAGNOSIS — K219 Gastro-esophageal reflux disease without esophagitis: Secondary | ICD-10-CM | POA: Diagnosis not present

## 2020-06-22 DIAGNOSIS — K5903 Drug induced constipation: Secondary | ICD-10-CM

## 2020-06-22 DIAGNOSIS — R112 Nausea with vomiting, unspecified: Secondary | ICD-10-CM

## 2020-06-22 DIAGNOSIS — K2100 Gastro-esophageal reflux disease with esophagitis, without bleeding: Secondary | ICD-10-CM

## 2020-06-22 MED ORDER — ONDANSETRON HCL 4 MG PO TABS: 4.0000 mg | ORAL_TABLET | Freq: Three times a day (TID) | ORAL | 2 refills | Status: DC | PRN

## 2020-06-22 MED ORDER — ESOMEPRAZOLE MAGNESIUM 40 MG PO CPDR: 40.0000 mg | DELAYED_RELEASE_CAPSULE | Freq: Every day | ORAL | 3 refills | Status: DC

## 2020-06-22 MED ORDER — NALOXEGOL OXALATE 12.5 MG PO TABS: 12.5000 mg | ORAL_TABLET | Freq: Every day | ORAL | 3 refills | Status: DC

## 2020-06-22 NOTE — Patient Instructions (Addendum)
Will refill Movantiq Continue Zofran as needed for nausea Continue Nexium 40 mg qday
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
## 2020-06-30 IMAGING — DX DG ABDOMEN ACUTE W/ 1V CHEST
3 series · 3 of 3 positions shown · non-contrast
Comparison: Chest x-ray 04/12/2017. Abdominal radiograph
12/10/2016.

CLINICAL DATA: 52-year-old female with history of bilateral leg
swelling and leg pain starting few days ago. No associated fever.
Chills and nausea. Cough and shortness of breath, worsened with
exertion.

EXAM:
DG ABDOMEN ACUTE W/ 1V CHEST

[chest pa]
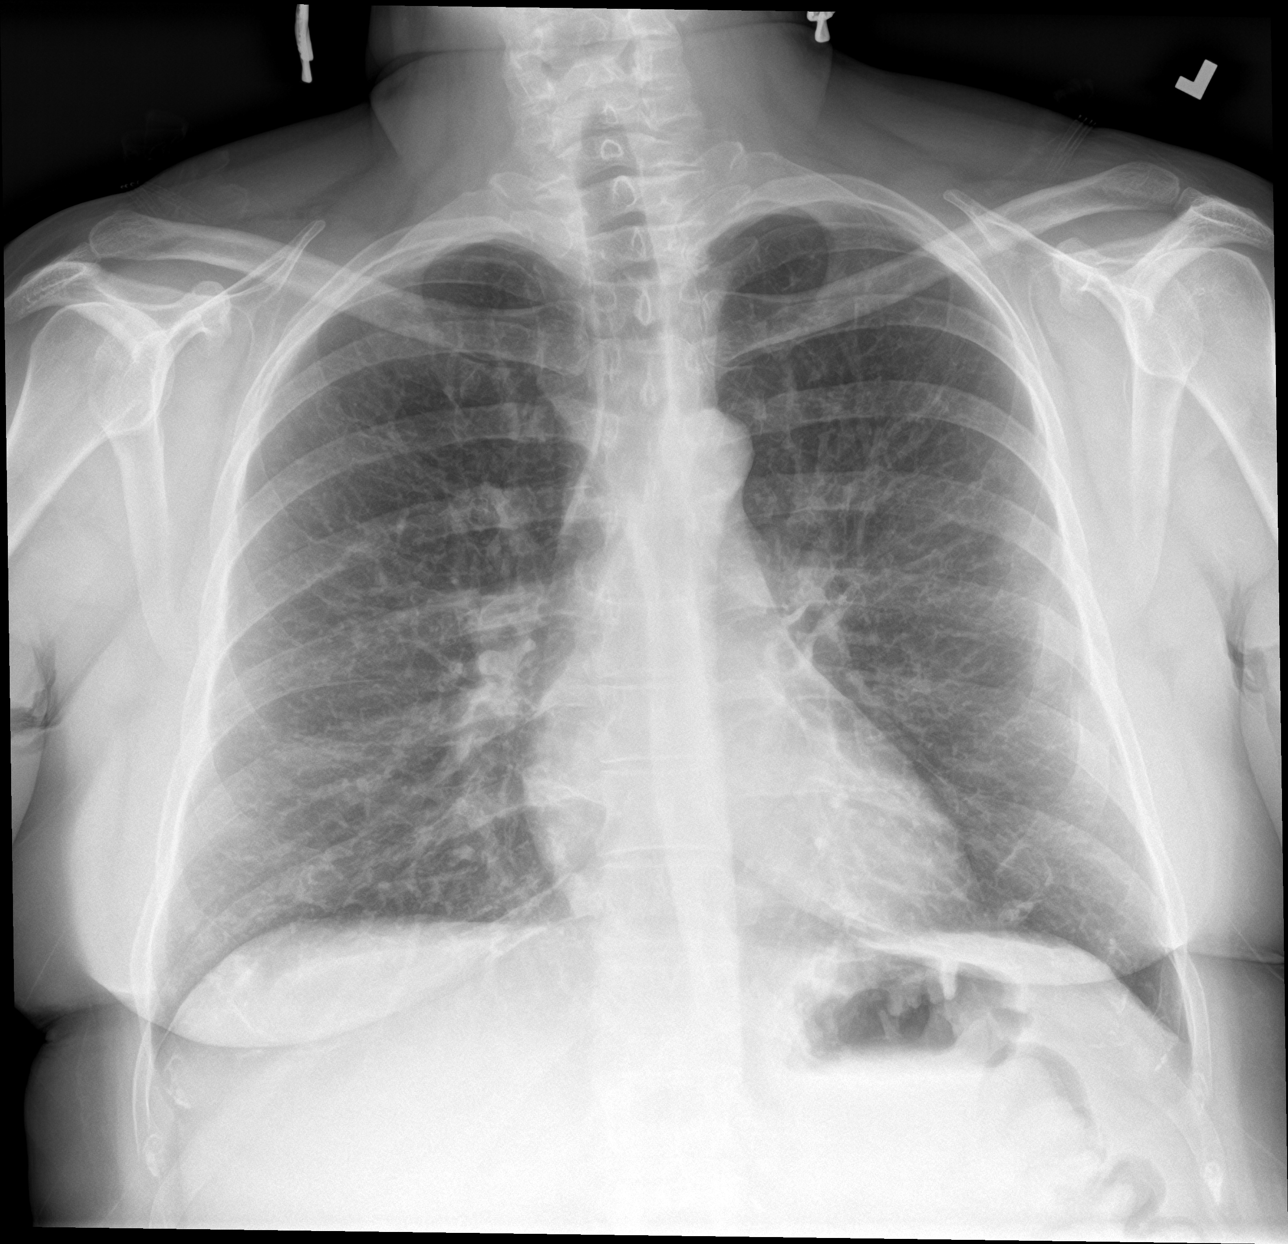

[abdomen erect]
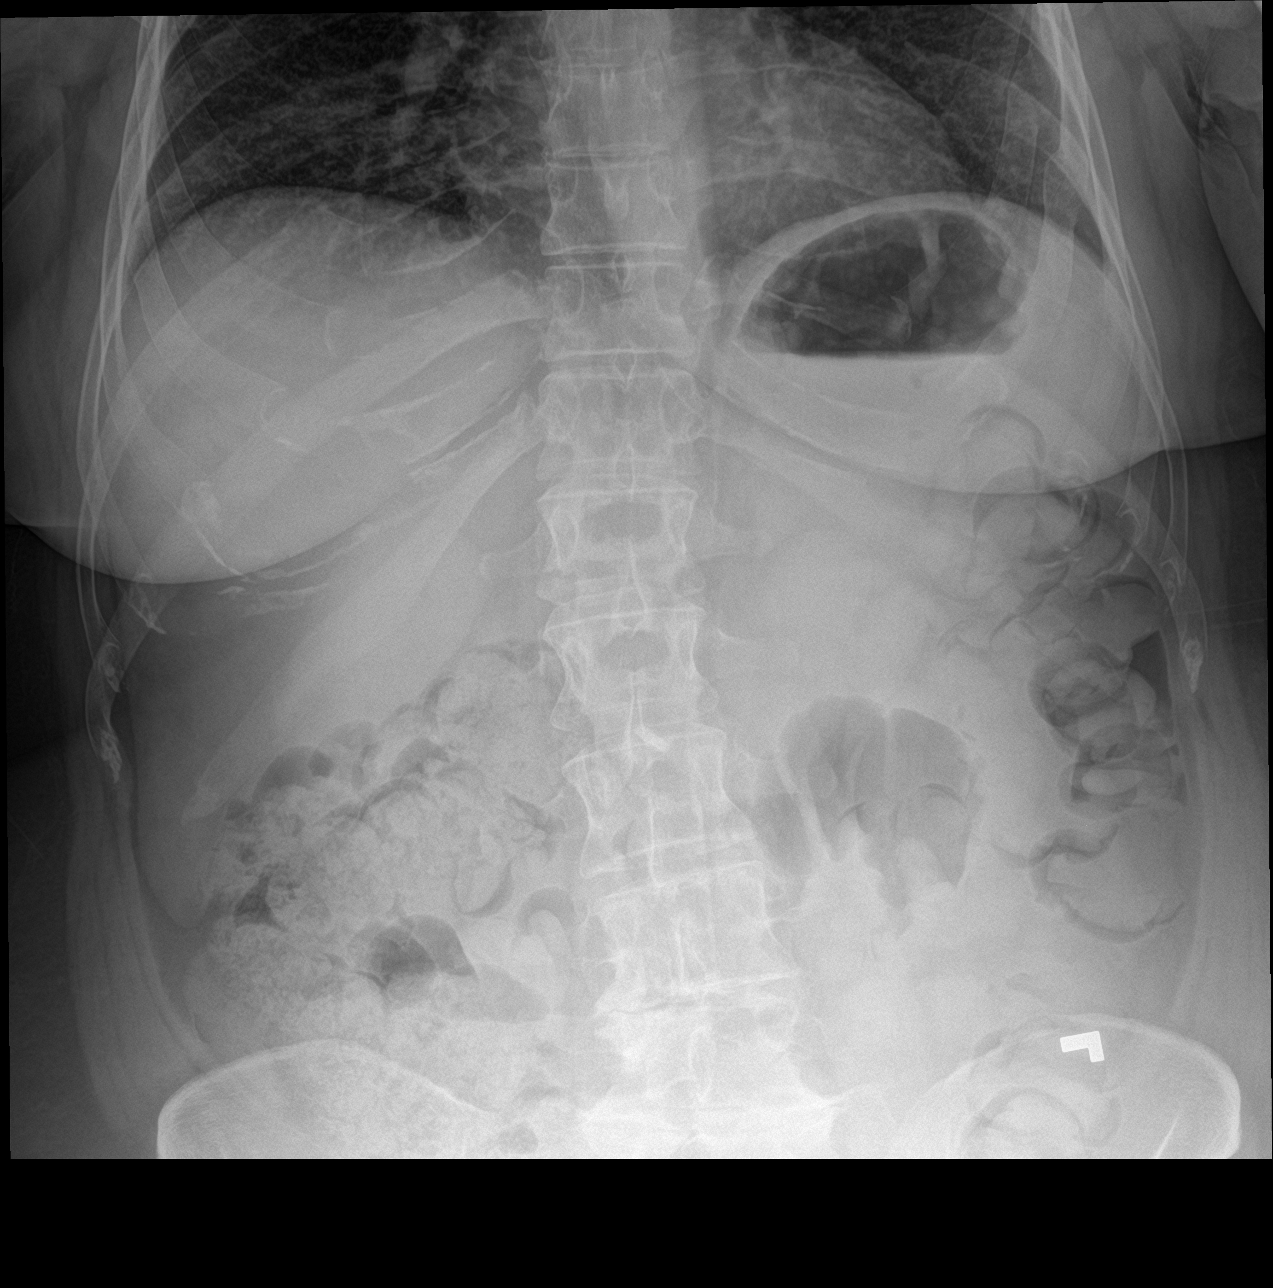

[abdomen supine]
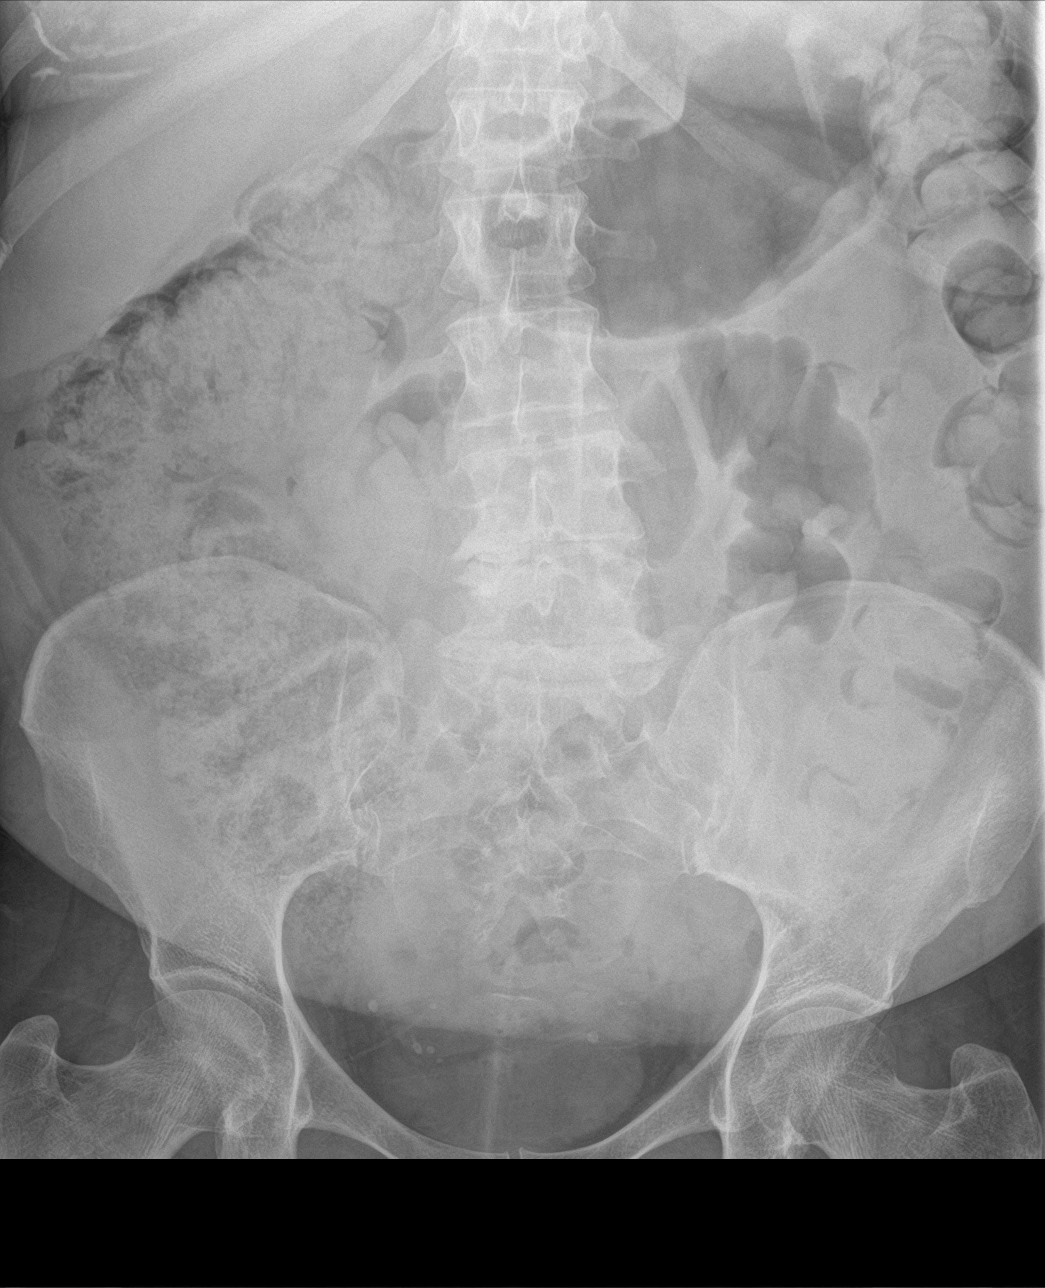

[3 of 3 positions shown; findings below may reference images not displayed]

FINDINGS: Lung volumes are normal. No consolidative airspace disease. No
pleural effusions. No pneumothorax. No pulmonary nodule or mass
noted. Pulmonary vasculature and the cardiomediastinal silhouette
are within normal limits.

Gas and stool are seen scattered throughout the colon extending to
the level of the distal rectum. No pathologic distension of small
bowel is noted. No gross evidence of pneumoperitoneum.
IMPRESSION: 1. No radiographic evidence of acute cardiopulmonary disease.
2. Nonobstructive bowel gas pattern.
3. No pneumoperitoneum.

## 2020-07-07 ENCOUNTER — Other Ambulatory Visit: Payer: Medicaid Other | Admitting: Obstetrics & Gynecology

## 2020-08-27 ENCOUNTER — Ambulatory Visit (INDEPENDENT_AMBULATORY_CARE_PROVIDER_SITE_OTHER): Payer: Medicaid Other | Admitting: Gastroenterology

## 2020-09-23 ENCOUNTER — Other Ambulatory Visit: Payer: Self-pay | Admitting: Family Medicine
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
## 2020-10-29 ENCOUNTER — Ambulatory Visit: Payer: Medicaid Other | Admitting: Family Medicine

## 2020-10-29 DIAGNOSIS — R600 Localized edema: Secondary | ICD-10-CM

## 2020-10-29 DIAGNOSIS — R0600 Dyspnea, unspecified: Secondary | ICD-10-CM

## 2020-10-29 DIAGNOSIS — Z720 Tobacco use: Secondary | ICD-10-CM

## 2020-10-29 DIAGNOSIS — I739 Peripheral vascular disease, unspecified: Secondary | ICD-10-CM

## 2020-11-04 ENCOUNTER — Ambulatory Visit: Payer: Medicaid Other | Admitting: Urology

## 2020-11-13 ENCOUNTER — Ambulatory Visit (INDEPENDENT_AMBULATORY_CARE_PROVIDER_SITE_OTHER): Payer: Medicaid Other | Admitting: Family Medicine

## 2020-11-13 ENCOUNTER — Encounter: Payer: Self-pay | Admitting: Family Medicine

## 2020-11-13 ENCOUNTER — Telehealth: Payer: Self-pay | Admitting: Family Medicine

## 2020-11-13 ENCOUNTER — Other Ambulatory Visit: Payer: Self-pay

## 2020-11-13 VITALS — BP 110/60 | HR 76 | Ht 61.0 in | Wt 149.4 lb

## 2020-11-13 DIAGNOSIS — Z720 Tobacco use: Secondary | ICD-10-CM | POA: Diagnosis not present

## 2020-11-13 DIAGNOSIS — R0600 Dyspnea, unspecified: Secondary | ICD-10-CM

## 2020-11-13 DIAGNOSIS — I739 Peripheral vascular disease, unspecified: Secondary | ICD-10-CM | POA: Diagnosis not present

## 2020-11-13 DIAGNOSIS — R600 Localized edema: Secondary | ICD-10-CM | POA: Diagnosis not present

## 2020-11-13 DIAGNOSIS — M79605 Pain in left leg: Secondary | ICD-10-CM

## 2020-11-13 DIAGNOSIS — M79604 Pain in right leg: Secondary | ICD-10-CM

## 2020-11-13 DIAGNOSIS — R0609 Other forms of dyspnea: Secondary | ICD-10-CM

## 2020-11-13 MED ORDER — TORSEMIDE 10 MG PO TABS: 10.0000 mg | ORAL_TABLET | Freq: Every day | ORAL | 6 refills | Status: DC
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
## 2020-11-13 NOTE — Patient Instructions (Addendum)
Medication Instructions:  Your physician recommends that you continue on your current medications as directed. Please refer to the Current Medication list given to you today.  Labwork: none  Testing/Procedures: Your physician has requested that you have an ankle brachial index (ABI). During this test an ultrasound and blood pressure cuff are used to evaluate the arteries that supply the arms and legs with blood. Allow thirty minutes for this exam. There are no restrictions or special instructions.  Follow-Up: Your physician recommends that you schedule a follow-up appointment in: 6 months  Any Other Special Instructions Will Be Listed Below (If Applicable).  If you need a refill on your cardiac medications before your next appointment, please call your pharmacy.

## 2020-11-13 NOTE — Telephone Encounter (Signed)
Checking percert on the following patient for testing scheduled at [facility].      US ARTERIAL STUDIES  11/19/2020

## 2020-11-19 ENCOUNTER — Ambulatory Visit (HOSPITAL_COMMUNITY): Admission: RE | Admit: 2020-11-19 | Payer: Medicaid Other | Source: Ambulatory Visit

## 2020-11-24 ENCOUNTER — Ambulatory Visit: Payer: Medicaid Other | Admitting: Family Medicine

## 2020-12-01 ENCOUNTER — Ambulatory Visit (HOSPITAL_COMMUNITY): Admission: RE | Admit: 2020-12-01 | Payer: Medicaid Other | Source: Ambulatory Visit

## 2020-12-03 ENCOUNTER — Encounter: Payer: Self-pay | Admitting: Family Medicine

## 2020-12-03 ENCOUNTER — Telehealth: Payer: Self-pay | Admitting: Family Medicine

## 2020-12-03 NOTE — Telephone Encounter (Signed)
Left message asking patient to contact the office in regards to re-scheduling US Arterial .

## 2021-01-07 ENCOUNTER — Encounter (INDEPENDENT_AMBULATORY_CARE_PROVIDER_SITE_OTHER): Payer: Self-pay | Admitting: Gastroenterology

## 2021-01-07 ENCOUNTER — Ambulatory Visit (INDEPENDENT_AMBULATORY_CARE_PROVIDER_SITE_OTHER): Payer: Medicaid Other | Admitting: Gastroenterology

## 2021-03-05 ENCOUNTER — Other Ambulatory Visit (INDEPENDENT_AMBULATORY_CARE_PROVIDER_SITE_OTHER): Payer: Self-pay | Admitting: Gastroenterology

## 2021-03-05 DIAGNOSIS — K2100 Gastro-esophageal reflux disease with esophagitis, without bleeding: Secondary | ICD-10-CM

## 2021-04-16 ENCOUNTER — Emergency Department (HOSPITAL_COMMUNITY)
Admission: EM | Admit: 2021-04-16 | Discharge: 2021-04-16 | Disposition: A | Discharge status: Home / Self Care | Payer: Medicaid Other | Attending: Emergency Medicine | Admitting: Emergency Medicine

## 2021-04-16 ENCOUNTER — Emergency Department (HOSPITAL_COMMUNITY): Payer: Medicaid Other

## 2021-04-16 ENCOUNTER — Encounter (HOSPITAL_COMMUNITY): Payer: Self-pay | Admitting: *Deleted

## 2021-04-16 DIAGNOSIS — F17200 Nicotine dependence, unspecified, uncomplicated: Secondary | ICD-10-CM | POA: Diagnosis not present

## 2021-04-16 DIAGNOSIS — Z79899 Other long term (current) drug therapy: Secondary | ICD-10-CM | POA: Insufficient documentation

## 2021-04-16 DIAGNOSIS — M79604 Pain in right leg: Secondary | ICD-10-CM | POA: Insufficient documentation

## 2021-04-16 DIAGNOSIS — E039 Hypothyroidism, unspecified: Secondary | ICD-10-CM | POA: Insufficient documentation

## 2021-04-16 DIAGNOSIS — J449 Chronic obstructive pulmonary disease, unspecified: Secondary | ICD-10-CM | POA: Insufficient documentation

## 2021-04-16 DIAGNOSIS — Z5321 Procedure and treatment not carried out due to patient leaving prior to being seen by health care provider: Secondary | ICD-10-CM | POA: Insufficient documentation

## 2021-04-16 DIAGNOSIS — M791 Myalgia, unspecified site: Secondary | ICD-10-CM | POA: Diagnosis not present

## 2021-04-16 DIAGNOSIS — Z7951 Long term (current) use of inhaled steroids: Secondary | ICD-10-CM | POA: Diagnosis not present

## 2021-04-16 MED ORDER — KETOROLAC TROMETHAMINE 60 MG/2ML IM SOLN
30.0000 mg | Freq: Once | INTRAMUSCULAR | Status: AC
  Administered 2021-04-16: 30 mg via INTRAMUSCULAR
  Filled 2021-04-16: qty 2

## 2021-04-16 NOTE — ED Triage Notes (Signed)
Fell yesterday , pain in right hip and groin area, states she takes suboxone and it does not help

## 2021-04-16 NOTE — ED Provider Notes (Signed)
[facility] EMERGENCY DEPARTMENT Provider Note   CSN: [id number] Arrival date & time: 04/16/21  1626     History Chief Complaint  Patient presents with   Meredith [name] is a 56 y.o. female presents to the ED for evaluation of right hip/leg pain for the past three week after a fall. The patient mentions that she had another fall yesterday after her dog pulled his leash making him fall. She denies any LOC or back pain.  Patient is currently on Suboxone, but reports she has not taken her Suboxone over the few days as she was afraid it would not give her any pain medicine because she is on Suboxone.  The patient reports she is ambulatory and reports that her pain hurts whenever she is in walking for a long time.  She reports that this on her muscles it hurts, it is her "bones".  Medical history includes COPD, GERD, hypothyroidism, paranoid schizophrenia, bipolar.  No pertinent surgical history.  No known drug allergies.  Patient reports daily tobacco use, but denies any EtOH, drug use, or IV drug use ever.   Fall      Home Medications Prior to Admission medications   Medication Sig Start Date End Date Taking? Authorizing Provider  albuterol (VENTOLIN HFA) 108 (90 Base) MCG/ACT inhaler Inhale 2 puffs into the lungs every 6 (six) hours as needed for wheezing or shortness of breath. 02/01/20   Lavella Hammock, MD  ARIPiprazole (ABILIFY) 20 MG tablet Take 1 tablet (20 mg total) by mouth daily. 05/29/20   Salley Scarlet, MD  busPIRone (BUSPAR) 15 MG tablet Take 1 tablet (15 mg total) by mouth 2 (two) times daily. 05/28/20   Salley Scarlet, MD  citalopram (CELEXA) 40 MG tablet Take 40 mg by mouth daily.    [provider]  cyclobenzaprine (FLEXERIL) 10 MG tablet Take 1 tablet by mouth 2 (two) times daily. 11/10/20   [provider]  esomeprazole (NEXIUM) 40 MG capsule TAKE (1) CAPSULE BYMOUTH ONCE DAILY. 03/08/21   Harvel Quale, MD  fluticasone (FLONASE) 50  MCG/ACT nasal spray Place 1 spray into both nostrils 2 (two) times daily. 02/01/20   Lavella Hammock, MD  hydrocerin (EUCERIN) CREA Apply 1 application topically 2 (two) times daily. Patient taking differently: Apply 1 application topically 2 (two) times daily. As needed. 05/29/20   Salley Scarlet, MD  hydrOXYzine (ATARAX/VISTARIL) 25 MG tablet Take 25 mg by mouth 2 (two) times daily as needed. 09/23/20   [provider]  lithium carbonate 300 MG capsule Take 1 capsule (300 mg total) by mouth daily. 05/29/20   Salley Scarlet, MD  meloxicam (MOBIC) 15 MG tablet Take 15 mg by mouth daily. 11/02/20   [provider]  montelukast (SINGULAIR) 10 MG tablet Take 1 tablet (10 mg total) by mouth at bedtime. 02/01/20   Lavella Hammock, MD  naloxegol oxalate (MOVANTIK) 12.5 MG TABS tablet Take 1 tablet (12.5 mg total) by mouth daily. 06/22/20   Harvel Quale, MD  naloxone [facility]) nasal spray 4 mg/0.1 mL Place 2 sprays into the nose as needed. 11/10/20   [provider]  ondansetron (ZOFRAN-ODT) 4 MG disintegrating tablet Take 4 mg by mouth every 8 (eight) hours as needed. 11/10/20   [provider]  pregabalin (LYRICA) 25 MG capsule Take 25 mg by mouth 3 (three) times daily. 11/02/20   [provider]  SPIRIVA HANDIHALER 18 MCG inhalation capsule INHALE 1  CAPSULE BY MOUTHTDAILY.M 03/07/19   [provider]  SUBOXONE 8-2 MG FILM Take 1 Film by mouth in the morning and at bedtime. 04/10/20   [provider]  tamsulosin (FLOMAX) 0.4 MG CAPS capsule Take 1 capsule (0.4 mg total) by mouth daily. 05/06/20   McKenzie, [name], MD  torsemide (DEMADEX) 10 MG tablet Take 1 tablet (10 mg total) by mouth daily. 11/13/20   [name]., NP  traZODone (DESYREL) 150 MG tablet Take 1 tablet (150 mg total) by mouth at bedtime as needed for sleep. 05/28/20   Salley Scarlet, MD      Allergies    Patient has no known allergies.    Review of Systems    Review of Systems  Genitourinary:  Negative for dysuria and hematuria.  Musculoskeletal:  Positive for arthralgias and myalgias. Negative for back pain, gait problem and joint swelling.   Physical Exam Updated Vital Signs BP 140/77    Pulse 72    Temp 97.6 F (36.4 C) (Oral)    Resp 16    SpO2 98%  Physical Exam Vitals and nursing note reviewed.  Constitutional:      General: She is not in acute distress.    Appearance: Normal appearance. She is not toxic-appearing.  Eyes:     General: No scleral icterus. Pulmonary:     Effort: Pulmonary effort is normal. No respiratory distress.  Musculoskeletal:        General: No tenderness or deformity.     Right lower leg: No edema.     Left lower leg: No edema.     Comments: No obvious deformity.  No midline or paraspinal back tenderness.  Nontender to right or left buttock.  No tenderness palpation of the right lower extremity.  Compartments are soft.  Sensations intact bilaterally.  Pulses intact bilaterally.  Strength 5 out of 5 bilaterally.  The patient is ambulatory in the room with high heels.  Skin:    General: Skin is dry.     Findings: No rash.  Neurological:     General: No focal deficit present.     Mental Status: She is alert. Mental status is at baseline.  Psychiatric:        Mood and Affect: Mood normal.    ED Results / Procedures / Treatments   Labs (all labs ordered are listed, but only abnormal results are displayed) Labs Reviewed - No data to display  EKG None  Radiology DG Hip Unilat  With Pelvis 2-3 Views Right  Result Date: 04/16/2021 CLINICAL DATA:  fall EXAM: DG HIP (WITH OR WITHOUT PELVIS) 2-3V RIGHT COMPARISON:  None. FINDINGS: There is no evidence of hip fracture or dislocation of the right hip. Frontal view of the left hip unremarkable. No acute displaced fracture or diastasis of the bones of the pelvis. There is no evidence of arthropathy or other focal bone abnormality. IMPRESSION: Negative.  Electronically Signed   By: Iven Finn M.D.   On: 04/16/2021 17:22    Procedures Procedures  Hemodynamically stable  Medications Ordered in ED Medications  ketorolac (TORADOL) injection 30 mg (30 mg Intramuscular Given 04/16/21 1919)    ED Course/ Medical Decision Making/ A&P                           Medical Decision Making  56 year old female presents emergency department for evaluation of right hip pain for the past 3 weeks but gradually worsening  yesterday after another fall.  Vital signs unremarkable.  Physical exam unremarkable.  Patient does not have any tenderness on exam to her back or right lower extremity.  Compartments are soft.  Sensations intact bilaterally.  Pulses are intact.  She is ambulatory in the room with high heels.  The patient is requesting Percocets in the ED and to be sent home with some.  I questioned her why she stopped taking her Suboxone as this would help with her pain.  She reports that she wants any different than Suboxone for her pain.  I discussed with her that her image does not show any signs of fracture or break and that I could give her some Toradol for her pain now and send her home with ibuprofen 600 mg.  She reports she will take the Toradol but is requesting Percocets.  After the patient received her Toradol, I went to have another discussion with her that we cannot give her any Percocets given the new law as she does not have any acute fracture or break but the patient had eloped.  Unable to discuss return precautions with the patient as she had already left.  Final Clinical Impression(s) / ED Diagnoses Final diagnoses:  Right leg pain    Rx / DC Orders ED Discharge Orders     None         Sherrell Puller, Hershal Coria 04/16/21 2142    Noemi Chapel, MD 04/17/21 2025

## 2021-04-16 NOTE — ED Notes (Signed)
When nurse went in to give pain medication pt stated that she would be leaving and going to [facility] if that was all we were going to do for her pain. Nurse explained that we would have to give this medication time to work and re evaluate. Pt eloped during that time window.

## 2021-04-27 ENCOUNTER — Other Ambulatory Visit: Payer: Self-pay | Admitting: Nurse Practitioner

## 2021-04-27 DIAGNOSIS — M79604 Pain in right leg: Secondary | ICD-10-CM

## 2021-05-06 ENCOUNTER — Ambulatory Visit (HOSPITAL_COMMUNITY): Admission: RE | Admit: 2021-05-06 | Payer: Medicaid Other | Source: Ambulatory Visit

## 2021-05-06 ENCOUNTER — Other Ambulatory Visit (INDEPENDENT_AMBULATORY_CARE_PROVIDER_SITE_OTHER): Payer: Self-pay | Admitting: *Deleted

## 2021-05-06 DIAGNOSIS — K2100 Gastro-esophageal reflux disease with esophagitis, without bleeding: Secondary | ICD-10-CM

## 2021-05-06 MED ORDER — ESOMEPRAZOLE MAGNESIUM 40 MG PO CPDR: DELAYED_RELEASE_CAPSULE | ORAL | 0 refills | Status: DC

## 2021-05-20 ENCOUNTER — Ambulatory Visit: Payer: Medicaid Other | Admitting: Family Medicine

## 2021-05-21 ENCOUNTER — Ambulatory Visit: Payer: Medicaid Other | Admitting: Urology

## 2021-05-31 ENCOUNTER — Ambulatory Visit: Payer: Medicaid Other | Admitting: Cardiology

## 2021-05-31 ENCOUNTER — Encounter: Payer: Self-pay | Admitting: Cardiology
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
## 2021-06-01 ENCOUNTER — Encounter: Payer: Self-pay | Admitting: Cardiology

## 2021-06-03 ENCOUNTER — Ambulatory Visit (INDEPENDENT_AMBULATORY_CARE_PROVIDER_SITE_OTHER): Payer: Medicaid Other | Admitting: Gastroenterology

## 2021-07-14 ENCOUNTER — Encounter (HOSPITAL_COMMUNITY): Payer: Self-pay

## 2021-07-14 ENCOUNTER — Inpatient Hospital Stay (HOSPITAL_COMMUNITY)
Admission: AD | Admit: 2021-07-14 | Discharge: 2021-07-22 | DRG: 885 | Disposition: A | Discharge status: Home / Self Care | Payer: Medicaid Other | Attending: Psychiatry | Admitting: Psychiatry

## 2021-07-14 ENCOUNTER — Other Ambulatory Visit: Payer: Self-pay

## 2021-07-14 ENCOUNTER — Encounter (HOSPITAL_COMMUNITY): Payer: Self-pay | Admitting: Nurse Practitioner

## 2021-07-14 DIAGNOSIS — F1410 Cocaine abuse, uncomplicated: Secondary | ICD-10-CM | POA: Diagnosis present

## 2021-07-14 DIAGNOSIS — I10 Essential (primary) hypertension: Secondary | ICD-10-CM | POA: Diagnosis present

## 2021-07-14 DIAGNOSIS — F17210 Nicotine dependence, cigarettes, uncomplicated: Secondary | ICD-10-CM | POA: Diagnosis present

## 2021-07-14 DIAGNOSIS — J449 Chronic obstructive pulmonary disease, unspecified: Secondary | ICD-10-CM | POA: Diagnosis present

## 2021-07-14 DIAGNOSIS — Z825 Family history of asthma and other chronic lower respiratory diseases: Secondary | ICD-10-CM

## 2021-07-14 DIAGNOSIS — Z79899 Other long term (current) drug therapy: Secondary | ICD-10-CM

## 2021-07-14 DIAGNOSIS — Z9151 Personal history of suicidal behavior: Secondary | ICD-10-CM | POA: Diagnosis not present

## 2021-07-14 DIAGNOSIS — Z791 Long term (current) use of non-steroidal anti-inflammatories (NSAID): Secondary | ICD-10-CM | POA: Diagnosis not present

## 2021-07-14 DIAGNOSIS — Z85828 Personal history of other malignant neoplasm of skin: Secondary | ICD-10-CM

## 2021-07-14 DIAGNOSIS — Z823 Family history of stroke: Secondary | ICD-10-CM | POA: Diagnosis not present

## 2021-07-14 DIAGNOSIS — M797 Fibromyalgia: Secondary | ICD-10-CM | POA: Diagnosis present

## 2021-07-14 DIAGNOSIS — F1110 Opioid abuse, uncomplicated: Secondary | ICD-10-CM | POA: Diagnosis present

## 2021-07-14 DIAGNOSIS — K5900 Constipation, unspecified: Secondary | ICD-10-CM | POA: Diagnosis present

## 2021-07-14 DIAGNOSIS — Z8261 Family history of arthritis: Secondary | ICD-10-CM | POA: Diagnosis not present

## 2021-07-14 DIAGNOSIS — F250 Schizoaffective disorder, bipolar type: Secondary | ICD-10-CM | POA: Diagnosis present

## 2021-07-14 DIAGNOSIS — F419 Anxiety disorder, unspecified: Secondary | ICD-10-CM | POA: Diagnosis present

## 2021-07-14 DIAGNOSIS — Z8249 Family history of ischemic heart disease and other diseases of the circulatory system: Secondary | ICD-10-CM | POA: Diagnosis not present

## 2021-07-14 DIAGNOSIS — R519 Headache, unspecified: Secondary | ICD-10-CM | POA: Diagnosis not present

## 2021-07-14 DIAGNOSIS — K219 Gastro-esophageal reflux disease without esophagitis: Secondary | ICD-10-CM | POA: Diagnosis present

## 2021-07-14 DIAGNOSIS — F1510 Other stimulant abuse, uncomplicated: Secondary | ICD-10-CM | POA: Diagnosis present

## 2021-07-14 DIAGNOSIS — F17200 Nicotine dependence, unspecified, uncomplicated: Secondary | ICD-10-CM

## 2021-07-14 DIAGNOSIS — Z20822 Contact with and (suspected) exposure to covid-19: Secondary | ICD-10-CM | POA: Diagnosis present

## 2021-07-14 LAB — COMPREHENSIVE METABOLIC PANEL
ALT: 18 U/L (ref 0–44)
AST: 14 U/L — ABNORMAL LOW (ref 15–41)
Albumin: 3.7 g/dL (ref 3.5–5.0)
Alkaline Phosphatase: 72 U/L (ref 38–126)
Anion gap: 7 (ref 5–15)
BUN: 31 mg/dL — ABNORMAL HIGH (ref 6–20)
CO2: 24 mmol/L (ref 22–32)
Calcium: 9.4 mg/dL (ref 8.9–10.3)
Chloride: 109 mmol/L (ref 98–111)
Creatinine, Ser: 0.56 mg/dL (ref 0.44–1.00)
GFR, Estimated: >60 mL/min (ref >60)
Glucose, Bld: 106 mg/dL — ABNORMAL HIGH (ref 70–99)
Potassium: 3.8 mmol/L (ref 3.5–5.1)
Sodium: 140 mmol/L (ref 135–145)
Total Bilirubin: 0.3 mg/dL (ref 0.3–1.2)
Total Protein: 6.5 g/dL (ref 6.5–8.1)

## 2021-07-14 LAB — RESP PANEL BY RT-PCR (FLU A&B, COVID) ARPGX2
Influenza A by PCR: NEGATIVE
Influenza B by PCR: NEGATIVE
SARS Coronavirus 2 by RT PCR: NEGATIVE

## 2021-07-14 LAB — HEPATITIS PANEL, ACUTE
HCV Ab: NONREACTIVE
Hep A IgM: NONREACTIVE
Hep B C IgM: NONREACTIVE
Hepatitis B Surface Ag: NONREACTIVE

## 2021-07-14 LAB — RAPID URINE DRUG SCREEN, HOSP PERFORMED
Amphetamines: NOT DETECTED
Barbiturates: NOT DETECTED
Benzodiazepines: NOT DETECTED
Cocaine: POSITIVE — AB
Opiates: NOT DETECTED
Tetrahydrocannabinol: NOT DETECTED

## 2021-07-14 LAB — CBC
HCT: 47.6 % — ABNORMAL HIGH (ref 36.0–46.0)
Hemoglobin: 16 g/dL — ABNORMAL HIGH (ref 12.0–15.0)
MCH: 30.9 pg (ref 26.0–34.0)
MCHC: 33.6 g/dL (ref 30.0–36.0)
MCV: 92.1 fL (ref 80.0–100.0)
Platelets: 254 10*3/uL (ref 150–400)
RBC: 5.17 MIL/uL — ABNORMAL HIGH (ref 3.87–5.11)
RDW: 14.1 % (ref 11.5–15.5)
WBC: 9.1 10*3/uL (ref 4.0–10.5)
nRBC: 0 % (ref 0.0–0.2)

## 2021-07-14 LAB — RPR: RPR Ser Ql: NONREACTIVE

## 2021-07-14 LAB — TSH: TSH: 0.024 u[IU]/mL — ABNORMAL LOW (ref 0.350–4.500)

## 2021-07-14 LAB — PREGNANCY, URINE: Preg Test, Ur: NEGATIVE

## 2021-07-14 LAB — LIPID PANEL
Cholesterol: 142 mg/dL (ref 0–200)
HDL: 44 mg/dL (ref >40)
LDL Cholesterol: 83 mg/dL (ref 0–99)
Total CHOL/HDL Ratio: 3.2 RATIO
Triglycerides: 74 mg/dL (ref <150)
VLDL: 15 mg/dL (ref 0–40)

## 2021-07-14 LAB — MAGNESIUM: Magnesium: 2.2 mg/dL (ref 1.7–2.4)

## 2021-07-14 LAB — HEMOGLOBIN A1C
Hgb A1c MFr Bld: 5.7 % — ABNORMAL HIGH (ref 4.8–5.6)
Mean Plasma Glucose: 116.89 mg/dL

## 2021-07-14 MED ORDER — UMECLIDINIUM BROMIDE 62.5 MCG/ACT IN AEPB
1.0000 | INHALATION_SPRAY | Freq: Every day | RESPIRATORY_TRACT | Status: DC
  Administered 2021-07-14 – 2021-07-22 (×8): 1 via RESPIRATORY_TRACT
  Filled 2021-07-14 (×2): qty 7

## 2021-07-14 MED ORDER — HYDROXYZINE HCL 25 MG PO TABS
25.0000 mg | ORAL_TABLET | Freq: Three times a day (TID) | ORAL | Status: DC | PRN
  Administered 2021-07-14: 25 mg via ORAL
  Filled 2021-07-14: qty 1

## 2021-07-14 MED ORDER — DICYCLOMINE HCL 20 MG PO TABS
20.0000 mg | ORAL_TABLET | Freq: Four times a day (QID) | ORAL | Status: AC | PRN
  Administered 2021-07-14: 20 mg via ORAL
  Filled 2021-07-14: qty 1

## 2021-07-14 MED ORDER — LORAZEPAM 1 MG PO TABS
1.0000 mg | ORAL_TABLET | ORAL | Status: AC | PRN
  Administered 2021-07-15: 1 mg via ORAL
  Filled 2021-07-14: qty 1

## 2021-07-14 MED ORDER — PANTOPRAZOLE SODIUM 40 MG PO TBEC
40.0000 mg | DELAYED_RELEASE_TABLET | Freq: Every day | ORAL | Status: DC
  Administered 2021-07-14 – 2021-07-22 (×9): 40 mg via ORAL
  Filled 2021-07-14 (×12): qty 1

## 2021-07-14 MED ORDER — MAGNESIUM HYDROXIDE 400 MG/5ML PO SUSP: 30.0000 mL | Freq: Every day | ORAL | Status: DC | PRN

## 2021-07-14 MED ORDER — BUSPIRONE HCL 10 MG PO TABS
10.0000 mg | ORAL_TABLET | Freq: Two times a day (BID) | ORAL | Status: DC
  Administered 2021-07-14 – 2021-07-17 (×6): 10 mg via ORAL
  Filled 2021-07-14 (×10): qty 1

## 2021-07-14 MED ORDER — METHOCARBAMOL 500 MG PO TABS
500.0000 mg | ORAL_TABLET | Freq: Three times a day (TID) | ORAL | Status: AC | PRN
  Administered 2021-07-14 – 2021-07-19 (×7): 500 mg via ORAL
  Filled 2021-07-14 (×7): qty 1

## 2021-07-14 MED ORDER — ACETAMINOPHEN 325 MG PO TABS
650.0000 mg | ORAL_TABLET | Freq: Four times a day (QID) | ORAL | Status: DC | PRN
  Administered 2021-07-15: 650 mg via ORAL
  Filled 2021-07-14: qty 2

## 2021-07-14 MED ORDER — TRAZODONE HCL 50 MG PO TABS
50.0000 mg | ORAL_TABLET | Freq: Every evening | ORAL | Status: DC | PRN
  Administered 2021-07-22: 50 mg via ORAL
  Filled 2021-07-14 (×2): qty 1

## 2021-07-14 MED ORDER — CLONIDINE HCL 0.1 MG PO TABS
0.1000 mg | ORAL_TABLET | Freq: Four times a day (QID) | ORAL | Status: DC | PRN
  Administered 2021-07-14 – 2021-07-17 (×2): 0.1 mg via ORAL
  Filled 2021-07-14 (×2): qty 1

## 2021-07-14 MED ORDER — NAPROXEN 500 MG PO TABS
500.0000 mg | ORAL_TABLET | Freq: Two times a day (BID) | ORAL | Status: AC | PRN
  Administered 2021-07-14 – 2021-07-19 (×6): 500 mg via ORAL
  Filled 2021-07-14 (×6): qty 1

## 2021-07-14 MED ORDER — LOPERAMIDE HCL 2 MG PO CAPS: 2.0000 mg | ORAL_CAPSULE | ORAL | Status: AC | PRN

## 2021-07-14 MED ORDER — ONDANSETRON 4 MG PO TBDP
4.0000 mg | ORAL_TABLET | Freq: Four times a day (QID) | ORAL | Status: AC | PRN
  Administered 2021-07-14: 4 mg via ORAL
  Filled 2021-07-14: qty 1

## 2021-07-14 MED ORDER — NICOTINE POLACRILEX 2 MG MT GUM: 2.0000 mg | CHEWING_GUM | OROMUCOSAL | Status: DC | PRN

## 2021-07-14 MED ORDER — ALUM & MAG HYDROXIDE-SIMETH 200-200-20 MG/5ML PO SUSP
30.0000 mL | ORAL | Status: DC | PRN
Start: 2021-07-14 — End: 2021-07-22

## 2021-07-14 MED ORDER — ARIPIPRAZOLE 10 MG PO TABS
20.0000 mg | ORAL_TABLET | Freq: Every day | ORAL | Status: DC
  Administered 2021-07-15 – 2021-07-16 (×2): 20 mg via ORAL
  Filled 2021-07-14 (×4): qty 2

## 2021-07-14 MED ORDER — OLANZAPINE 5 MG PO TBDP: 5.0000 mg | ORAL_TABLET | Freq: Three times a day (TID) | ORAL | Status: DC | PRN

## 2021-07-14 MED ORDER — TRAZODONE HCL 50 MG PO TABS
50.0000 mg | ORAL_TABLET | Freq: Every day | ORAL | Status: DC
  Administered 2021-07-14 – 2021-07-21 (×7): 50 mg via ORAL
  Filled 2021-07-14 (×12): qty 1

## 2021-07-14 MED ORDER — ALBUTEROL SULFATE HFA 108 (90 BASE) MCG/ACT IN AERS: 2.0000 | INHALATION_SPRAY | Freq: Four times a day (QID) | RESPIRATORY_TRACT | Status: DC | PRN

## 2021-07-14 MED ORDER — ARIPIPRAZOLE 15 MG PO TABS
15.0000 mg | ORAL_TABLET | Freq: Every day | ORAL | Status: AC
  Administered 2021-07-15: 15 mg via ORAL
  Filled 2021-07-14: qty 1

## 2021-07-14 MED ORDER — ARIPIPRAZOLE 15 MG PO TABS
15.0000 mg | ORAL_TABLET | Freq: Every day | ORAL | Status: DC
  Filled 2021-07-14 (×2): qty 1

## 2021-07-14 MED ORDER — ZIPRASIDONE MESYLATE 20 MG IM SOLR: 20.0000 mg | INTRAMUSCULAR | Status: DC | PRN

## 2021-07-14 MED ORDER — HYDROXYZINE HCL 25 MG PO TABS
25.0000 mg | ORAL_TABLET | Freq: Four times a day (QID) | ORAL | Status: AC | PRN
  Administered 2021-07-14 – 2021-07-18 (×9): 25 mg via ORAL
  Filled 2021-07-14 (×9): qty 1
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
## 2021-07-14 NOTE — Group Note (Signed)
Recreation Therapy Group Note ? ? ?Group Topic:Stress Management  ?Group Date: 07/14/2021 ?Start Time: 33 ?End Time: 0950 ?Facilitators: Victorino Sparrow, LRT,CTRS ?Location: [place] ? ? ?Goal Area(s) Addresses:  ?Patient will identify positive stress management techniques. ?Patient will identify benefits of using stress management post d/c. ? ?Group Description:  Meditation.  LRT played a meditation that focused on easing anxiety through a breathing technique.  Patients were to listen and follow along as meditation played to focus on breathing and using to their benefit of calming anxiety.  ? ? ?Affect/Mood: N/A ?  ?Participation Level: Did not attend ?  ? ?Clinical Observations/Individualized Feedback:   ? ? ?Plan: Continue to engage patient in RT group sessions 2-3x/week. ? ? ?Victorino Sparrow, LRT,CTRS ?07/14/2021 11:16 AM ?

## 2021-07-14 NOTE — Tx Team (Signed)
Initial Treatment Plan ?07/14/2021 ?6:34 AM ?Meredith [name] ?YSA:[id number] ? ? ? ?PATIENT STRESSORS: ?Financial difficulties   ?Health problems   ?Marital or family conflict   ?Medication change or noncompliance   ?Substance abuse   ? ? ?PATIENT STRENGTHS: ?Communication skills  ?General fund of knowledge  ?Motivation for treatment/growth  ?Supportive family/friends  ? ? ?PATIENT IDENTIFIED PROBLEMS: ?Substance abuse  ?Depression  ?psychosis  ?"My goal is to stop using drugs and get on the right meds"  ?  ?  ?  ?  ?  ?  ? ?DISCHARGE CRITERIA:  ?Improved stabilization in mood, thinking, and/or behavior ?Need for constant or close observation no longer present ?Reduction of life-threatening or endangering symptoms to within safe limits ?Verbal commitment to aftercare and medication compliance ? ?PRELIMINARY DISCHARGE PLAN: ?Outpatient therapy ? ?PATIENT/FAMILY INVOLVEMENT: ?This treatment plan has been presented to and reviewed with the patient, Meredith [name],  The patient and family have been given the opportunity to ask questions and make suggestions. ? ?Einar Pheasant, RN ?07/14/2021, 6:34 AM ?

## 2021-07-14 NOTE — H&P (Signed)
Behavioral Health Medical Screening Exam ? ?Meredith [name] is a 56 y.o. female with a history of schizoaffective disorder-bipolar type, opioid use disorder, and cocaine use disorder who presents to [facility] voluntarily as a walk-in with her friend [name] [phone]). Patient provides consent for Alvester Chou to participate in the assessment.  ? ?Patient reports that she has not taken any medication in approximately 3 months. States that she contacted [facility] and has a virtual appointment scheduled for this coming Friday. Patient states "People have been messing around my house. They have been removing gas and oil from car and my friend's car." Patient's friend states that his car was low on oil, but the car may be burning oil. Patient states " They have been injecting me with fentanyl and chemicals. I have needle marks and welts all over my body where they are injecting me with chemicals." No "welts" or lesions noted on assessment. She states "There are two people forcing me to stay in my house." When asked to identify these people. She states "I think one is Hydrologist. I met him about 5 years ago at my brother's work site." Further states that she has not seen him in 5 years. She states that the other person is "Gracy Racer, I think he faked his death, but Im not sure." When asked for clarification regarding his death, she states that she does not think he is dead. She states that Rob "comes into my house in a Psychologist, occupational, so no one can see him. He walks right in and out of the front door." She states that "Rob" was a Field seismologist and "he is able to get in the computer to change my records." She states that "Rob" has implanted a device in her scalp to track here and has "put a device behind my eye lids and when I like at you he can also see you."  ? ?Patient reports a decreased appetite. She is unsure about weight loss. She reports poor sleep. States that she has taken  trazodone in the past for sleep and found it helpful.  ? ?Patient reports that she has been smoking crack almost daily for the past 3 months. She states "I think the crack may have fentanyl in it." She denies use of marijuana, alcohol, and other substances. Patient states that her pain management physician (Dr. Melina Fiddler) was prescribing her suboxone 8 mg-2 mg BID, flexeril, Cymbalta, and meloxicam but she has not taken any of these in 3 months.  ? ?Patient reports that she has been followed by Dr. Addison Naegeli but has not seen him in at least 3 months. She states "he has me on a lot of medicine, I can't remember what." Patient later states that she was prescribed celexa, abilify, and lithium.  ? ?On evaluation, patient is alert and oriented x 4. She is fairly groomed. Eye contact is good. She is cooperative. Speech is clear and coherent. Patient reports mood as anxious, depressed, worthless, and hopeless. Affect is congruent with mood. Thought process is tangential at times and she requires some redirection to focus on assessment questions. Thought content is paranoid and delusional. Patient reports AH of voices that are "coming from the television." She states that these voices tell her that people are trying to kill her. Reports VH of a man in a English as a second language teacher. She does not appear to be responding to internal stimuli. Patient reports that she is suicidal, "because they have made me suicidal. But I  would never do it. I believe in God and do not want to go to hell." Patient reports HI towards the people she believes are harassing her. No HI intent or plan.  ? ?On chart review, it is noted that she has tried [place], Geodon, Seroquel, Trazodone, Buspar, and Abilify in the past. She has a history of multiple inpatient admissions. Her current presentation of paranoia and delusions appears to be similar to previous admissions.  Last admission was at [facility] 05/2020. She was discharged on abilify 20 mg daily, buspirone 15 mg  BID, hydroxyzine 50 mg TID prn, lithium 300 mg daily, and trazodone 150 mg QHS prn. ? ?Total Time spent with patient: 20 minutes ? ?Psychiatric Specialty Exam: ? ?Presentation  ?General Appearance: Fairly Groomed ? ?Eye Contact:Good ? ?Speech:Clear and Coherent; Normal Rate ? ?Speech Volume:Normal ? ?Handedness:No data recorded ? ?Mood and Affect  ?Mood:Anxious; Depressed; Hopeless; Worthless ? ?Affect:Congruent ? ? ?Thought Process  ?Thought Processes:Disorganized ? ?Descriptions of Associations:Loose ? ?Orientation:Full (Time, Place and Person) ? ?Thought Content:Delusions; Paranoid Ideation; Illogical ? ?History of Schizophrenia/Schizoaffective disorder:Yes ? ?Duration of Psychotic Symptoms:Greater than six months ? ?Hallucinations:Hallucinations: Auditory; Visual ?Description of Auditory Hallucinations: vocies telling her that someone is trying to kill her ? ?Ideas of Reference:Delusions; Paranoia ? ?Suicidal Thoughts:Suicidal Thoughts: Yes, Passive ?SI Passive Intent and/or Plan: Without Intent; Without Plan ? ?Homicidal Thoughts:Homicidal Thoughts: Yes, Passive ?HI Passive Intent and/or Plan: Without Intent; Without Plan ? ? ?Sensorium  ?Memory:Immediate Fair; Recent Fair; Remote Fair ? ?Judgment:Impaired ? ?Insight:Shallow ? ? ?Executive Functions  ?Concentration:Poor ? ?Attention Span:Poor ? ?Recall:Fair ? ?Fund of [place] ? ?Language:Good ? ? ?Psychomotor Activity  ?Psychomotor Activity:Psychomotor Activity: Restlessness ? ? ?Assets  ?Assets:Desire for Improvement; Housing; Social Support; Physical Health ? ? ?Sleep  ?Sleep:Sleep: Poor ? ? ? ?Physical Exam: ?Physical Exam ?Constitutional:   ?   General: She is not in acute distress. ?   Appearance: She is not ill-appearing, toxic-appearing or diaphoretic.  ?HENT:  ?   Head: Normocephalic.  ?   Right Ear: External ear normal.  ?   Left Ear: External ear normal.  ?Eyes:  ?   General:     ?   Right eye: No discharge.     ?   Left eye: No discharge.  ?    Pupils: Pupils are equal, round, and reactive to light.  ?Cardiovascular:  ?   Rate and Rhythm: Normal rate.  ?Pulmonary:  ?   Effort: Pulmonary effort is normal. No respiratory distress.  ?Musculoskeletal:     ?   General: Normal range of motion.  ?   Cervical back: Normal range of motion.  ?Skin: ?   General: Skin is warm and dry.  ?Neurological:  ?   Mental Status: She is alert and oriented to person, place, and time.  ?Psychiatric:     ?   Mood and Affect: Mood is anxious and depressed.     ?   Behavior: Behavior is cooperative.     ?   Thought Content: Thought content is paranoid and delusional. Thought content includes suicidal ideation.  ? ?Review of Systems  ?Constitutional:  Positive for malaise/fatigue. Negative for chills, diaphoresis and fever.  ?HENT:  Negative for congestion.   ?Respiratory:  Negative for cough and shortness of breath.   ?Cardiovascular:  Negative for chest pain and palpitations.  ?Gastrointestinal:  Negative for diarrhea, nausea and vomiting.  ?Musculoskeletal:  Positive for myalgias.  ?     Reports generalized body pain.   ?  Neurological:  Negative for dizziness and seizures.  ?Psychiatric/Behavioral:  Positive for depression, hallucinations, substance abuse and suicidal ideas. Negative for memory loss. The patient is nervous/anxious and has insomnia.   ?All other systems reviewed and are negative. ? ?Blood pressure (!) 142/94, pulse 85, temperature 98.2 ?F (36.8 ?C), resp. rate 16, SpO2 100 %. There is no height or weight on file to calculate BMI. ? ?Musculoskeletal: ?Strength & Muscle Tone: within normal limits ?Gait & Station: normal ?Patient leans: N/A ? ? ?Recommendations: ? ?Based on my evaluation the patient does not appear to have an emergency medical condition. ? ?Patient accepted to [facility] for inpatient treatment.  ? ?Patient denies use of fentanyl/opioids but states during assessment that she is being injected with fentanyl and that it may be in the crack that she is using.  Plan to monitor COWS for now.  ? ?Meds ordered this encounter  ?Medications  ? acetaminophen (TYLENOL) tablet 650 mg  ? alum & mag hydroxide-simeth (MAALOX/MYLANTA) 200-200-20 MG/5ML suspension 30 mL  ? magn

## 2021-07-14 NOTE — Progress Notes (Signed)
Urine collected for UDS and urine pregnancy, placed in fridge for pickup. ?

## 2021-07-14 NOTE — Progress Notes (Signed)
pt is a 56 y.o. caucasian female with a history of schizoaffective disorder-bipolar type, opioid use disorder, and cocaine use disorder who presents to [facility] voluntarily for medication adjustment and for drug use detox.   ?Patient reports that she has not taken any medication in approximately 3 months. Patient says " They have been injecting me with fentanyl and chemicals. I have needle marks and welts all over my body where they are injecting me with chemicals." No "welts" or lesions noted during the skin assessment.   ?Patient reports a decreased appetite. She is unsure about weight loss. She reports poor sleep. States that she has taken trazodone in the past for sleep and found it helpful. Patient reports that she has been smoking crack almost daily for the past 3 months. She states "I think the crack may have fentanyl in it." She denies use of marijuana, alcohol, and other substances.   ? patient is alert and oriented x 4, fairly groomed, cooperative and has good eye contact. Speech is clear and coherent. Patient reports mood as anxious, depressed, worthless, and hopeless. Affect is congruent with mood. Thought process is tangential at times and she requires some redirection to focus on assessment questions. Thought content is paranoid and delusional. Pt denies SI and HI at this time. Pt's belongings searched, pt oriented to the unit now in bed appears asleep. ?

## 2021-07-14 NOTE — [facility] Group Notes (Signed)
[facility] Group Notes:  (Nursing/MHT/Case Management/Adjunct) ? ?Date:  07/14/2021  ?Time:  8:30 PM ? ?Type of Therapy:   na ? ?Participation Level:  Did Not Attend ? ?Participation Quality:   did not attend ? ?Affect:   did not attend ? ?Cognitive:   did not attend ? ?Insight:  None ? ?Engagement in Group:  did not attend ? ?Modes of Intervention:  did not attend ? ?Summary of Progress/Problems: Patient did not attend group ? ?Jerrye Bushy  Aubreana Cornacchia ?07/14/2021, 8:30 PM ?

## 2021-07-14 NOTE — BH IP Treatment Plan (Signed)
Interdisciplinary Treatment and Diagnostic Plan Update ? ?07/14/2021 ?Time of Session: 9:20am  ?Meredith [name] ?MRN: [id number] ? ?Principal Diagnosis: Schizoaffective disorder, bipolar type ([place]) ? ?Secondary Diagnoses: Principal Problem: ?  Schizoaffective disorder, bipolar type ([place]) ? ? ?Current Medications:  ?Current Facility-Administered Medications  ?Medication Dose Route Frequency Provider Last Rate Last Admin  ? acetaminophen (TYLENOL) tablet 650 mg  650 mg Oral Q6H PRN Lindon Romp A, NP      ? albuterol (VENTOLIN HFA) 108 (90 Base) MCG/ACT inhaler 2 puff  2 puff Inhalation Q6H PRN Rozetta Nunnery, NP      ? alum & mag hydroxide-simeth (MAALOX/MYLANTA) 200-200-20 MG/5ML suspension 30 mL  30 mL Oral Q4H PRN Lindon Romp A, NP      ? hydrOXYzine (ATARAX) tablet 25 mg  25 mg Oral TID PRN Rozetta Nunnery, NP   25 mg at 07/14/21 0817  ? OLANZapine zydis (ZYPREXA) disintegrating tablet 5 mg  5 mg Oral Q8H PRN Rozetta Nunnery, NP      ? And  ? LORazepam (ATIVAN) tablet 1 mg  1 mg Oral PRN Rozetta Nunnery, NP      ? And  ? ziprasidone (GEODON) injection 20 mg  20 mg Intramuscular PRN Lindon Romp A, NP      ? magnesium hydroxide (MILK OF MAGNESIA) suspension 30 mL  30 mL Oral Daily PRN Lindon Romp A, NP      ? pantoprazole (PROTONIX) EC tablet 40 mg  40 mg Oral Daily Lindon Romp A, NP   40 mg at 07/14/21 0818  ? traZODone (DESYREL) tablet 50 mg  50 mg Oral QHS PRN Lindon Romp A, NP      ? umeclidinium bromide (INCRUSE ELLIPTA) 62.5 MCG/ACT 1 puff  1 puff Inhalation Daily Lindon Romp A, NP   1 puff at 07/14/21 0818  ? ?PTA Medications: ?Medications Prior to Admission  ?Medication Sig Dispense Refill Last Dose  ? atorvastatin (LIPITOR) 10 MG tablet Take 10 mg by mouth daily.     ? cycloSPORINE (RESTASIS) 0.05 % ophthalmic emulsion 1 drop 2 (two) times daily.     ? diclofenac (VOLTAREN) 75 MG EC tablet Take 75 mg by mouth 2 (two) times daily.     ? DULoxetine (CYMBALTA) 20 MG capsule Take 20 mg by mouth daily.     ?  albuterol (VENTOLIN HFA) 108 (90 Base) MCG/ACT inhaler Inhale 2 puffs into the lungs every 6 (six) hours as needed for wheezing or shortness of breath. 1 each 0   ? ARIPiprazole (ABILIFY) 20 MG tablet Take 1 tablet (20 mg total) by mouth daily. 30 tablet 1   ? busPIRone (BUSPAR) 15 MG tablet Take 1 tablet (15 mg total) by mouth 2 (two) times daily. 60 tablet 1   ? citalopram (CELEXA) 40 MG tablet Take 40 mg by mouth daily.     ? cyclobenzaprine (FLEXERIL) 10 MG tablet Take 1 tablet by mouth 2 (two) times daily.     ? esomeprazole (NEXIUM) 40 MG capsule TAKE (1) CAPSULE BYMOUTH ONCE DAILY. 30 capsule 0   ? fluticasone (FLONASE) 50 MCG/ACT nasal spray Place 1 spray into both nostrils 2 (two) times daily.  2   ? hydrOXYzine (ATARAX/VISTARIL) 25 MG tablet Take 25 mg by mouth 2 (two) times daily as needed.     ? lithium carbonate 300 MG capsule Take 1 capsule (300 mg total) by mouth daily. 30 capsule 1   ? meloxicam (MOBIC) 15 MG tablet Take 15  mg by mouth daily.     ? montelukast (SINGULAIR) 10 MG tablet Take 1 tablet (10 mg total) by mouth at bedtime. 30 tablet 0   ? naloxegol oxalate (MOVANTIK) 12.5 MG TABS tablet Take 1 tablet (12.5 mg total) by mouth daily. 90 tablet 3   ? naloxone (NARCAN) nasal spray 4 mg/0.1 mL Place 2 sprays into the nose as needed.     ? pregabalin (LYRICA) 25 MG capsule Take 25 mg by mouth 3 (three) times daily.     ? SPIRIVA HANDIHALER 18 MCG inhalation capsule INHALE 1 CAPSULE BY MOUTHTDAILY.M     ? SUBOXONE 8-2 MG FILM Take 1 Film by mouth in the morning and at bedtime.     ? tamsulosin (FLOMAX) 0.4 MG CAPS capsule Take 1 capsule (0.4 mg total) by mouth daily. 30 capsule 11   ? torsemide (DEMADEX) 10 MG tablet Take 1 tablet (10 mg total) by mouth daily. 30 tablet 6   ? traZODone (DESYREL) 150 MG tablet Take 1 tablet (150 mg total) by mouth at bedtime as needed for sleep. 30 tablet 1   ? ? ?Patient Stressors: Financial difficulties   ?Health problems   ?Marital or family conflict    ?Medication change or noncompliance   ?Substance abuse   ? ?Patient Strengths: Communication skills  ?General fund of knowledge  ?Motivation for treatment/growth  ?Supportive family/friends  ? ?Treatment Modalities: Medication Management, Group therapy, Case management,  ?1 to 1 session with clinician, Psychoeducation, Recreational therapy. ? ? ?Physician Treatment Plan for Primary Diagnosis: Schizoaffective disorder, bipolar type ([place]) ?Long Term Goal(s):    ? ?Short Term Goals:   ? ?Medication Management: Evaluate patient's response, side effects, and tolerance of medication regimen. ? ?Therapeutic Interventions: 1 to 1 sessions, Unit Group sessions and Medication administration. ? ?Evaluation of Outcomes: Not Met ? ?Physician Treatment Plan for Secondary Diagnosis: Principal Problem: ?  Schizoaffective disorder, bipolar type ([place]) ? ?Long Term Goal(s):    ? ?Short Term Goals:      ? ?Medication Management: Evaluate patient's response, side effects, and tolerance of medication regimen. ? ?Therapeutic Interventions: 1 to 1 sessions, Unit Group sessions and Medication administration. ? ?Evaluation of Outcomes: Not Met ? ? ?RN Treatment Plan for Primary Diagnosis: Schizoaffective disorder, bipolar type ([place]) ?Long Term Goal(s): Knowledge of disease and therapeutic regimen to maintain health will improve ? ?Short Term Goals: Ability to remain free from injury will improve, Ability to participate in decision making will improve, Ability to verbalize feelings will improve, Ability to disclose and discuss suicidal ideas, and Ability to identify and develop effective coping behaviors will improve ? ?Medication Management: RN will administer medications as ordered by provider, will assess and evaluate patient's response and provide education to patient for prescribed medication. RN will report any adverse and/or side effects to prescribing provider. ? ?Therapeutic Interventions: 1 on 1 counseling sessions,  Psychoeducation, Medication administration, Evaluate responses to treatment, Monitor vital signs and CBGs as ordered, Perform/monitor CIWA, COWS, AIMS and Fall Risk screenings as ordered, Perform wound care treatments as ordered. ? ?Evaluation of Outcomes: Not Met ? ? ?LCSW Treatment Plan for Primary Diagnosis: Schizoaffective disorder, bipolar type ([place]) ?Long Term Goal(s): Safe transition to appropriate next level of care at discharge, Engage patient in therapeutic group addressing interpersonal concerns. ? ?Short Term Goals: Engage patient in aftercare planning with referrals and resources, Increase social support, Increase emotional regulation, Facilitate acceptance of mental health diagnosis and concerns, Identify triggers associated with mental health/substance abuse issues,  and Increase skills for wellness and recovery ? ?Therapeutic Interventions: Assess for all discharge needs, 1 to 1 time with Education officer, museum, Explore available resources and support systems, Assess for adequacy in community support network, Educate family and significant other(s) on suicide prevention, Complete Psychosocial Assessment, Interpersonal group therapy. ? ?Evaluation of Outcomes: Not Met ? ? ?Progress in Treatment: ?Attending groups: Yes. ?Participating in groups: Yes. ?Taking medication as prescribed: Yes. ?Toleration medication: Yes. ?Family/Significant other contact made: No, will contact:  If consents are provided  ?Patient understands diagnosis: Yes. ?Discussing patient identified problems/goals with staff: Yes. ?Medical problems stabilized or resolved: Yes. ?Denies suicidal/homicidal ideation: Yes. ?Issues/concerns per patient self-inventory: No. ? ? ?New problem(s) identified: No, Describe:  None  ? ?New Short Term/Long Term Goal(s): medication stabilization, elimination of SI thoughts, development of comprehensive mental wellness plan.  ? ?Patient Goals: "To regulate my medications"  ? ?Discharge Plan or Barriers: Patient  recently admitted. CSW will continue to follow and assess for appropriate referrals and possible discharge planning.  ? ?Reason for Continuation of Hospitalization: Delusions  ?Depression ?Medication stabilization ? ?Estimated

## 2021-07-14 NOTE — Group Note (Signed)
LCSW Group Therapy Note ? ? ?Group Date: 07/14/2021 ?Start Time: 1300 ?End Time: 1400 ? ?Type of Therapy and Topic: Group Therapy: Control ? ?Participation Level: Did Not Attend ? ?Description of Group: ?In this group patients will discuss what is out of their control, what is somewhat in their control, and what is within their control.  They will be encouraged to explore what issues they can control and what issues are out of their control within their daily lives. They will be guided to discuss their thoughts, feelings, and behaviors related to these issues. The group will process together ways to better control things that are well within our own control and how to notice and accept the things that are not within our control. This group will be process-oriented, with patients participating in exploration of their own experiences as well as giving and receiving support and challenge from other group members.  During this group 2 worksheets will be provided to each patient to follow along and fill out.  ? ?Therapeutic Goals: ?1. Patient will identify what is within their control and what is not within their control. ?2. Patient will identify their thoughts and feelings about having control over their own lives. ?3. Patient will identify their thoughts and feelings about not having control over everything in their lives.Marland Kitchen ?4. Patient will identify ways that they can have more control over their own lives. ?5. Patient will identify areas were they can allow others to help them or provide assistance. ? ?Summary of Patient Progress: Did not attend  ? ?Darleen Crocker, LCSWA ?07/14/2021  1:48 PM   ? ?
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
## 2021-07-14 NOTE — Progress Notes (Signed)
?   07/14/21 2128  ?Psych Admission Type (Psych Patients Only)  ?Admission Status Voluntary  ?Psychosocial Assessment  ?Patient Complaints Anxiety;Irritability;Suspiciousness  ?Eye Contact Brief  ?Facial Expression Angry;Anxious  ?Affect Preoccupied  ?Speech Rapid;Pressured;Argumentative  ?Interaction Assertive  ?Motor Activity Other (Comment) ?(WDL)  ?Appearance/Hygiene In hospital gown  ?Behavior Characteristics Irritable;Guarded;Agressive verbally  ?Mood Irritable  ?Thought Process  ?Coherency Disorganized  ?Content Blaming others;Preoccupation;Paranoia  ?Delusions Paranoid  ?Perception WDL  ?Hallucination None reported or observed  ?Judgment Impaired  ?Confusion None  ?Danger to Self  ?Current suicidal ideation? Denies  ?Danger to Others  ?Danger to Others None reported or observed  ? ? ?

## 2021-07-14 NOTE — Plan of Care (Signed)
Nurse discussed anxiety, depression and coping skills with patient.  

## 2021-07-14 NOTE — [facility] Group Notes (Signed)
Patient did not attend morning orientation group.  

## 2021-07-14 NOTE — Progress Notes (Signed)
Patient stated she would like these medications. ? ?Suboxene that she had been taking before [facility] admission. ?Medication that she had been taking for herpes. ?Stated she needs to take thorazine or be sent to the hospital for her medications. ? ?

## 2021-07-14 NOTE — Progress Notes (Addendum)
D:  Patient denied SI and HI, contracts for safety.  Denied A/V hallucinations.   ?A:  Medications administered per MD orders.  Emotional support and encouragement given patient. ?R:  Safety maintained with 15 minute checks. ?Patient stated she slept 3 hours last night.  Will discuss her home medications with MD today. ? ?

## 2021-07-15 MED ORDER — ACYCLOVIR 200 MG PO CAPS
400.0000 mg | ORAL_CAPSULE | Freq: Two times a day (BID) | ORAL | Status: DC
  Administered 2021-07-15 – 2021-07-22 (×14): 400 mg via ORAL
  Filled 2021-07-15 (×19): qty 2
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
## 2021-07-15 NOTE — Progress Notes (Signed)
?   07/15/21 2045  ?Psych Admission Type (Psych Patients Only)  ?Admission Status Voluntary  ?Psychosocial Assessment  ?Patient Complaints Anxiety  ?Eye Contact Fair  ?Facial Expression Anxious  ?Affect Preoccupied  ?Speech Logical/coherent;Argumentative  ?Interaction Assertive  ?Motor Activity Slow  ?Appearance/Hygiene Disheveled  ?Behavior Characteristics Anxious  ?Mood Anxious;Labile  ?Aggressive Behavior  ?Effect No apparent injury  ?Thought Process  ?Coherency Disorganized  ?Content Blaming others  ?Delusions Controlling  ?Perception WDL  ?Hallucination None reported or observed  ?Judgment Impaired  ?Confusion None  ?Danger to Self  ?Current suicidal ideation? Denies  ? ? ?

## 2021-07-15 NOTE — BHH Counselor (Signed)
CSW spoke with the Pt who states that she was being seen by the [facility] in [place].  CSW contact this center and was informed that the Pt was scheduled to be seen there approximately 6 months ago but did not show up for her first appointment.  CSW was informed that if the Pt wishes to be seen there in the future she will need to contact them after her discharge from the hospital to reschedule the appointment.  The center would not allow CSW to reschedule this appointment.  CSW will inform the Pt. ?

## 2021-07-15 NOTE — [facility] Suicide Risk Assessment (Signed)
[facility] INPATIENT:  Family/Significant Other Suicide Prevention Education ? ?Suicide Prevention Education:  ?Patient Refusal for Family/Significant Other Suicide Prevention Education: The patient Meredith [name] has refused to provide written consent for family/significant other to be provided Family/Significant Other Suicide Prevention Education during admission and/or prior to discharge.  Physician notified. ? ?Darleen Crocker ?07/15/2021, 10:00 AM ?

## 2021-07-15 NOTE — Plan of Care (Signed)
?  Problem: Activity: ?Goal: Interest or engagement in activities will improve ?Outcome: Progressing ?Goal: Sleeping patterns will improve ?Outcome: Progressing ?  ?Problem: Coping: ?Goal: Ability to verbalize frustrations and anger appropriately will improve ?Outcome: Progressing ?Goal: Ability to demonstrate self-control will improve ?Outcome: Progressing ?  ?Problem: Physical Regulation: ?Goal: Ability to maintain clinical measurements within normal limits will improve ?Outcome: Progressing ?  ?
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
## 2021-07-16 LAB — THYROID PEROXIDASE ANTIBODY: Thyroperoxidase Ab SerPl-aCnc: 16 IU/mL (ref 0–34)

## 2021-07-16 LAB — T3, FREE: T3, Free: 3.6 pg/mL (ref 2.0–4.4)

## 2021-07-16 LAB — T4: T4, Total: 7.4 ug/dL (ref 4.5–12.0)

## 2021-07-16 LAB — THYROGLOBULIN ANTIBODY: Thyroglobulin Antibody: <1 IU/mL (ref 0.0–0.9)

## 2021-07-16 MED ORDER — ARIPIPRAZOLE 15 MG PO TABS
25.0000 mg | ORAL_TABLET | Freq: Every day | ORAL | Status: DC
  Administered 2021-07-17 – 2021-07-22 (×6): 25 mg via ORAL
  Filled 2021-07-16 (×8): qty 1

## 2021-07-16 MED ORDER — IBUPROFEN 400 MG PO TABS
400.0000 mg | ORAL_TABLET | Freq: Three times a day (TID) | ORAL | Status: DC | PRN
  Administered 2021-07-16 – 2021-07-22 (×5): 400 mg via ORAL
  Filled 2021-07-16 (×5): qty 1

## 2021-07-16 MED ORDER — OLANZAPINE 5 MG PO TABS
5.0000 mg | ORAL_TABLET | Freq: Two times a day (BID) | ORAL | Status: DC
  Filled 2021-07-16 (×4): qty 1

## 2021-07-16 MED ORDER — LORAZEPAM 1 MG PO TABS
1.0000 mg | ORAL_TABLET | Freq: Four times a day (QID) | ORAL | Status: DC | PRN
  Administered 2021-07-16 – 2021-07-20 (×9): 1 mg via ORAL
  Filled 2021-07-16 (×9): qty 1

## 2021-07-16 MED ORDER — OLANZAPINE 5 MG PO TABS
5.0000 mg | ORAL_TABLET | Freq: Two times a day (BID) | ORAL | Status: DC
  Administered 2021-07-16: 5 mg via ORAL
  Filled 2021-07-16 (×4): qty 1

## 2021-07-16 MED ORDER — POLYETHYLENE GLYCOL 3350 17 G PO PACK
17.0000 g | PACK | Freq: Every day | ORAL | Status: DC
  Administered 2021-07-16 – 2021-07-21 (×4): 17 g via ORAL
  Filled 2021-07-16 (×9): qty 1

## 2021-07-16 MED ORDER — OLANZAPINE 5 MG PO TBDP
5.0000 mg | ORAL_TABLET | Freq: Three times a day (TID) | ORAL | Status: DC | PRN
  Filled 2021-07-16: qty 1

## 2021-07-16 MED ORDER — ZIPRASIDONE MESYLATE 20 MG IM SOLR: 20.0000 mg | Freq: Four times a day (QID) | INTRAMUSCULAR | Status: DC | PRN

## 2021-07-16 NOTE — Group Note (Signed)
Recreation Therapy Group Note ? ? ?Group Topic:Self-Esteem  ?Group Date: 07/16/2021 ?Start Time: 1005 ?End Time: 1100 ?Facilitators: Victorino Sparrow, LRT,CTRS ?Location: [facility] ? ? ?Goal Area(s) Addresses:  ?Patient will appropriately identify what self esteem is.  ?Patient will create a shield of armor describing themselves.  ?Patient will successfully identify positive attributes about themselves.  ?Patient will acknowledge benefit of improved self-esteem.  ? ?Group Description:  Self-Esteem Shield. Patient attended a recreation therapy group session focused on self esteem. Patient identified what self esteem is, and why it is important to have high self esteem during group discussion. LRT gave patients a print out of a blank shield with four quadrants.  Patients were asked to fill in the four quadrants reflected the following:  ?The Upper Left quadrant- 2 things or people you value ?The Upper Right quadrant- 2 lessons you have learned thus far in life ?The Lower Left quadrant- 3 qualities that make you unique ?The Lower Right quadrant- 1 goal you want to work towards ?  ?Patients were provided sheets with the shield printed on them and colored pencils, markers and crayons to complete the activity.  ?Patients and writer had group related discussions while individually working on their activity.  ?Patients were debriefed on the importance of healthy self esteem and offered a handout for ways to increase self esteem. ? ? ?Affect/Mood: Flat ?  ?Participation Level: Engaged ?  ?Participation Quality: Independent ?  ?Behavior: Appropriate ?  ?Speech/Thought Process: Focused ?  ?Insight: Good ?  ?Judgement: Good ?  ?Modes of Intervention: Art ?  ?Patient Response to Interventions:  Engaged ?  ?Education Outcome: ? Acknowledges education and In group clarification offered   ? ?Clinical Observations/Individualized Feedback: Pt was flat but engaged.  Pt was quiet and attentive to peers.  Pt identified her  significant other and her life as things she values.  Lessons learned where identified as "not believe everything you hear" and "not to be remembered as a mistake maker".  The qualities that make patient unique were her attitude and her resilience.  Pt expressed the goal she is working towards is going home.  ? ? ?Plan: Continue to engage patient in RT group sessions 2-3x/week. ? ? ?Victorino Sparrow, LRT,CTRS ?07/16/2021 1:11 PM ?
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
## 2021-07-16 NOTE — Progress Notes (Signed)
?   07/16/21 2030  ?Psych Admission Type (Psych Patients Only)  ?Admission Status Voluntary  ?Psychosocial Assessment  ?Patient Complaints Anxiety  ?Eye Contact Fair  ?Facial Expression Anxious  ?Affect Preoccupied  ?Speech Logical/coherent;Argumentative  ?Interaction Assertive  ?Motor Activity Slow  ?Appearance/Hygiene Disheveled  ?Behavior Characteristics Cooperative  ?Mood Labile;Preoccupied  ?Aggressive Behavior  ?Effect No apparent injury  ?Thought Process  ?Coherency Disorganized  ?Content Blaming others  ?Delusions Controlling  ?Perception WDL  ?Hallucination None reported or observed  ?Judgment Impaired  ?Confusion None  ?Danger to Self  ?Current suicidal ideation? Denies  ? ? ?

## 2021-07-16 NOTE — BHH Group Notes (Signed)
Spirituality group facilitated by Kathrynn Humble, [place].  ? ?Group Description: Group focused on topic of hope. Patients participated in facilitated discussion around topic, connecting with one another around experiences and definitions for hope. Group members engaged with visual explorer photos, reflecting on what hope looks like for them today. Group engaged in discussion around how their definitions of hope are present today in hospital.  ? ?Modalities: Psycho-social ed, Adlerian, Narrative, MI  ? ?Patient Progress: did not attend. ?

## 2021-07-16 NOTE — Progress Notes (Signed)
?   07/16/21 0530  ?Sleep  ?Number of Hours 9  ? ? ?

## 2021-07-16 NOTE — BHH Group Notes (Signed)
Pt did not attend goals group. 

## 2021-07-16 NOTE — Progress Notes (Signed)
Pt presents irritable with avertive eye contact, pressured speech demanding to be d/c on initial interactions. Per pt "I slept well last night, my appetite is good. I just want to go home and take care of my self there. I'm tired being in here with the wrong medicines. I just want to be d/c". Noted to be guarded, isolative in her room for majority of this shift, visible in milieu for medications and meals. Received PRN Vistaril 25 mg twice and Ativan 1 mg all PO and was effective. Emotional support and reassurance provided to pt. Safety checks maintained at Q 15 minutes intervals without self harm gestures or outburst. All medications administered with verbal education and effects monitored. Pt remains medication compliant, denies adverse drug reactions. Tolerates all meals and fluids well. Attended scheduled group when prompted.    ?

## 2021-07-16 NOTE — Group Note (Signed)
Type of Therapy and Topic: Group Therapy: Control ? ?Participation Level: Did Not Attend ? ?Description of Group: ?In this group patients will discuss what is out of their control, what is somewhat in their control, and what is within their control.  They will be encouraged to explore what issues they can control and what issues are out of their control within their daily lives. They will be guided to discuss their thoughts, feelings, and behaviors related to these issues. The group will process together ways to better control things that are well within our own control and how to notice and accept the things that are not within our control. This group will be process-oriented, with patients participating in exploration of their own experiences as well as giving and receiving support and challenge from other group members.  During this group 2 worksheets will be provided to each patient to follow along and fill out.  ? ?Therapeutic Goals: ?1. Patient will identify what is within their control and what is not within their control. ?2. Patient will identify their thoughts and feelings about having control over their own lives. ?3. Patient will identify their thoughts and feelings about not having control over everything in their lives.Marland Kitchen ?4. Patient will identify ways that they can have more control over their own lives. ?5. Patient will identify areas were they can allow others to help them or provide assistance. ? ?Summary of Patient Progress: Patient declined participation in group. ? ?

## 2021-07-17 ENCOUNTER — Encounter (HOSPITAL_COMMUNITY): Payer: Self-pay | Admitting: Nurse Practitioner

## 2021-07-17 DIAGNOSIS — I10 Essential (primary) hypertension: Secondary | ICD-10-CM

## 2021-07-17 DIAGNOSIS — F1410 Cocaine abuse, uncomplicated: Secondary | ICD-10-CM

## 2021-07-17 MED ORDER — TORSEMIDE 10 MG PO TABS
10.0000 mg | ORAL_TABLET | Freq: Every day | ORAL | Status: DC
  Administered 2021-07-17 – 2021-07-22 (×5): 10 mg via ORAL
  Filled 2021-07-17 (×9): qty 1

## 2021-07-17 MED ORDER — PALIPERIDONE ER 6 MG PO TB24
9.0000 mg | ORAL_TABLET | Freq: Every day | ORAL | Status: DC
  Administered 2021-07-17 – 2021-07-19 (×3): 9 mg via ORAL
  Filled 2021-07-17 (×5): qty 1

## 2021-07-17 MED ORDER — BUSPIRONE HCL 5 MG PO TABS
5.0000 mg | ORAL_TABLET | Freq: Two times a day (BID) | ORAL | Status: DC
  Administered 2021-07-17 – 2021-07-19 (×4): 5 mg via ORAL
  Filled 2021-07-17 (×9): qty 1

## 2021-07-17 NOTE — Group Note (Signed)
LCSW Group Therapy Note ? ?Group Date: 07/17/2021 ?Start Time: 1130 ?End Time: 1200 ? ? ?Type of Therapy and Topic:  Group Therapy: Anger Cues and Responses ? ?Participation Level:  Active ? ? ?Description of Group:   ?In this group, patients learned how to recognize the physical, cognitive, emotional, and behavioral responses they have to anger-provoking situations.  They identified a recent time they became angry and how they reacted.  They analyzed how their reaction was possibly beneficial and how it was possibly unhelpful.  The group discussed a variety of healthier coping skills that could help with such a situation in the future.  Focus was placed on how helpful it is to recognize the underlying emotions to our anger, because working on those can lead to a more permanent solution as well as our ability to focus on the important rather than the urgent. ? ?Therapeutic Goals: ?Patients will remember their last incident of anger and how they felt emotionally and physically, what their thoughts were at the time, and how they behaved. ?Patients will identify how their behavior at that time worked for them, as well as how it worked against them. ?Patients will explore possible new behaviors to use in future anger situations. ?Patients will learn that anger itself is normal and cannot be eliminated, and that healthier reactions can assist with resolving conflict rather than worsening situations. ? ?Summary of Patient Progress:  Quanasia was active during the group. She shared a recent occurrence wherein being told how she feels by a doctor rather than the doctor allowing her to express her own feelings led to anger. She demonstrated limited, but a willingness to grow insight into the subject matter, was respectful of peers, and participated throughout the entire session. ? ?Therapeutic Modalities:   ?Cognitive Behavioral Therapy ? ? ? ?Maretta Los, LCSWA ?07/17/2021  2:52 PM   ? ?

## 2021-07-17 NOTE — Progress Notes (Signed)
?   07/17/21 0500  ?Sleep  ?Number of Hours 9  ? ? ?

## 2021-07-17 NOTE — Progress Notes (Signed)
Pt was encouraged but didn't attend orientation/goals group. ?

## 2021-07-17 NOTE — BHH Group Notes (Signed)
Psychoeducational Group Note ? ?Date: 07/17/2021 ?Time: 0900-1000 ? ? ? ?Goal Setting  ? ?Purpose of Group: This group helps to provide patients with the steps of setting a goal that is specific, measurable, attainable, realistic and time specific. A discussion on how we keep ourselves stuck with negative self talk. Homework given for Patients to write 30 positive attributes about themselves. ? ? ? ?Participation Level:  Active ? ?Participation Quality:  Appropriate ? ?Affect:  Appropriate ? ?Cognitive:  Appropriate ? ?Insight:  Improving ? ?Engagement in Group:  Engaged ? ?Additional Comments:  Pt came in and then left the room. ? ?Bryson Dames A ?

## 2021-07-17 NOTE — Progress Notes (Signed)
Visible in dayroom for scheduled groups. Denies SI, HI and AVH when assessed. Received PRN Vistaril, Advil, Ativan, Robaxin and Naproxen as ordered (See EMAR) for anxiety, pain and muscle spasm with effect. Started on Torsemide for elevated BP this shift. PRN Clonidine given for elevated BP and vitals recheck, WNL. Pt presents less irritable and is more cooperative with care. Observed in some groups in dayroom this shift. Safety checks maintained at Q 15 minutes intervals. Verbal education done on all scheduled medications and effects monitored. Pt tolerates all meals, fluids and medications well.  ?
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
## 2021-07-18 MED ORDER — TRIAMCINOLONE 0.1 % CREAM:EUCERIN CREAM 1:1
TOPICAL_CREAM | Freq: Three times a day (TID) | CUTANEOUS | Status: DC
  Filled 2021-07-18: qty 1

## 2021-07-18 MED ORDER — FLUTICASONE PROPIONATE 50 MCG/ACT NA SUSP
1.0000 | Freq: Two times a day (BID) | NASAL | Status: DC
  Administered 2021-07-18 – 2021-07-22 (×8): 1 via NASAL
  Filled 2021-07-18 (×2): qty 16

## 2021-07-18 MED ORDER — TRIAMCINOLONE 0.1 % CREAM:EUCERIN CREAM 1:1
TOPICAL_CREAM | Freq: Three times a day (TID) | CUTANEOUS | Status: DC
  Filled 2021-07-18 (×2): qty 1

## 2021-07-18 MED ORDER — CLOTRIMAZOLE 1 % EX CREA
TOPICAL_CREAM | Freq: Two times a day (BID) | CUTANEOUS | Status: DC
  Filled 2021-07-18 (×2): qty 15

## 2021-07-18 MED ORDER — OLANZAPINE 5 MG PO TBDP
5.0000 mg | ORAL_TABLET | Freq: Every day | ORAL | Status: AC
  Administered 2021-07-18: 5 mg via ORAL
  Filled 2021-07-18 (×2): qty 1
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
## 2021-07-18 NOTE — Progress Notes (Signed)
?   07/18/21 0400  ?Psych Admission Type (Psych Patients Only)  ?Admission Status Voluntary  ?Psychosocial Assessment  ?Patient Complaints Anxiety  ?Eye Contact Fair  ?Facial Expression Anxious;Flat;Worried  ?Affect Appropriate to circumstance  ?Speech Logical/coherent  ?Interaction Demanding  ?Motor Activity Slow  ?Appearance/Hygiene Unremarkable  ?Behavior Characteristics Cooperative  ?Mood Anxious;Preoccupied  ?Aggressive Behavior  ?Effect No apparent injury  ?Thought Process  ?Coherency Tangential  ?Content Blaming self  ?Delusions Paranoid  ?Perception WDL  ?Hallucination None reported or observed  ?Judgment Impaired  ?Confusion None  ?Danger to Self  ?Current suicidal ideation? Denies  ?Danger to Others  ?Danger to Others None reported or observed  ? ? ?

## 2021-07-18 NOTE — Group Note (Signed)
[facility] LCSW Group Therapy Note ? ?Date/Time:  07/18/2021  11:00AM-12:00PM ? ?Type of Therapy and Topic:  Group Therapy:  Music and Mood ? ?Participation Level:  Did Not Attend  ? ?Description of Group: ?In this process group, members listened to a variety of genres of music and identified that different types of music evoke different responses.  Patients were encouraged to identify music that was soothing for them and music that was energizing for them.  Patients discussed how this knowledge can help with wellness and recovery in various ways including managing depression and anxiety as well as encouraging healthy sleep habits.   ? ?Therapeutic Goals: ?Patients will explore the impact of different varieties of music on mood ?Patients will verbalize the thoughts they have when listening to different types of music ?Patients will identify music that is soothing to them as well as music that is energizing to them ?Patients will discuss how to use this knowledge to assist in maintaining wellness and recovery ?Patients will explore the use of music as a coping skill ? ?Summary of Patient Progress:  The patient was invited to group, did not attend. ? ?Therapeutic Modalities: ?Solution Focused Brief Therapy ?Activity ? ? ?Selmer Dominion, LCSW ?  ?

## 2021-07-18 NOTE — BHH Group Notes (Signed)
Adult Psychoeducational Group Note ? ?Date:  07/18/2021 ?Time:  10:01 AM ? ?Group Topic/Focus:  ?Goals Group:   The focus of this group is to help patients establish daily goals to achieve during treatment and discuss how the patient can incorporate goal setting into their daily lives to aide in recovery. ?Orientation:   The focus of this group is to educate the patient on the purpose and policies of crisis stabilization and provide a format to answer questions about their admission.  The group details unit policies and expectations of patients while admitted. ? ?Participation Level:  Active ? ?Participation Quality:  Appropriate ? ?Affect:  Appropriate ? ?Cognitive:  Appropriate ? ?Insight: Appropriate ? ?Engagement in Group:  Engaged ? ?Modes of Intervention:  Activity ? ?Additional Comments:  Patient participated in the goals group activity. ? ?Meredith [name] ?07/18/2021, 10:01 AM ?

## 2021-07-18 NOTE — BHH Group Notes (Signed)
Adult Psychoeducational Group Not ?Date:  07/18/2021 ?Time:  8118-8677 ?Group Topic/Focus: PROGRESSIVE RELAXATION. A group where deep breathing is taught and tensing and relaxation muscle groups is used. Imagery is used as well.  Pts are asked to imagine 3 pillars that hold them up when they are not able to hold themselves up and to share that with the group. ? ?Participation Level:  Active ? ?Participation Quality:  Appropriate ? ?Affect:  Appropriate ? ?Cognitive:  Oriented ? ?Insight: Improving ? ?Engagement in Group:  Engaged ? ?Modes of Intervention:  Activity, Discussion, Education, and Support ? ?Additional Comments:  Rates energy at a 2/10. States she could not stay and left the room ? ?Bryson Dames A ? ? ?

## 2021-07-18 NOTE — Progress Notes (Signed)
Pt is complaining of back pain 7/10 related to kidney stones. States ibuprofen  is not effective. Pt is agitated and was given hydroxyzine and robaxin. MD notified, no new orders given. ?

## 2021-07-18 NOTE — Plan of Care (Signed)
?  Problem: Education: ?Goal: Knowledge of [facility] Education information/materials will improve ?Outcome: Progressing ?  ?Problem: Safety: ?Goal: Periods of time without injury will increase ?Outcome: Progressing ?  ?Problem: Education: ?Goal: Emotional status will improve ?Outcome: Not Progressing ?Goal: Mental status will improve ?Outcome: Not Progressing ?  ?Problem: Activity: ?Goal: Sleeping patterns will improve ?Outcome: Not Progressing ?  ?
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
## 2021-07-19 ENCOUNTER — Encounter (HOSPITAL_COMMUNITY): Payer: Self-pay

## 2021-07-19 DIAGNOSIS — F250 Schizoaffective disorder, bipolar type: Secondary | ICD-10-CM

## 2021-07-19 LAB — URINALYSIS, COMPLETE (UACMP) WITH MICROSCOPIC
Bilirubin Urine: NEGATIVE
Glucose, UA: NEGATIVE mg/dL
Ketones, ur: NEGATIVE mg/dL
Leukocytes,Ua: NEGATIVE
Nitrite: NEGATIVE
Protein, ur: NEGATIVE mg/dL
Specific Gravity, Urine: 1.011 (ref 1.005–1.030)
pH: 5 (ref 5.0–8.0)

## 2021-07-19 IMAGING — CR DG CHEST 1V PORT
1 series · 1 of 1 positions shown · non-contrast
Comparison: 04/12/2017

CLINICAL DATA: Weakness and arrythmia.

EXAM:
PORTABLE CHEST 1 VIEW

[portable]
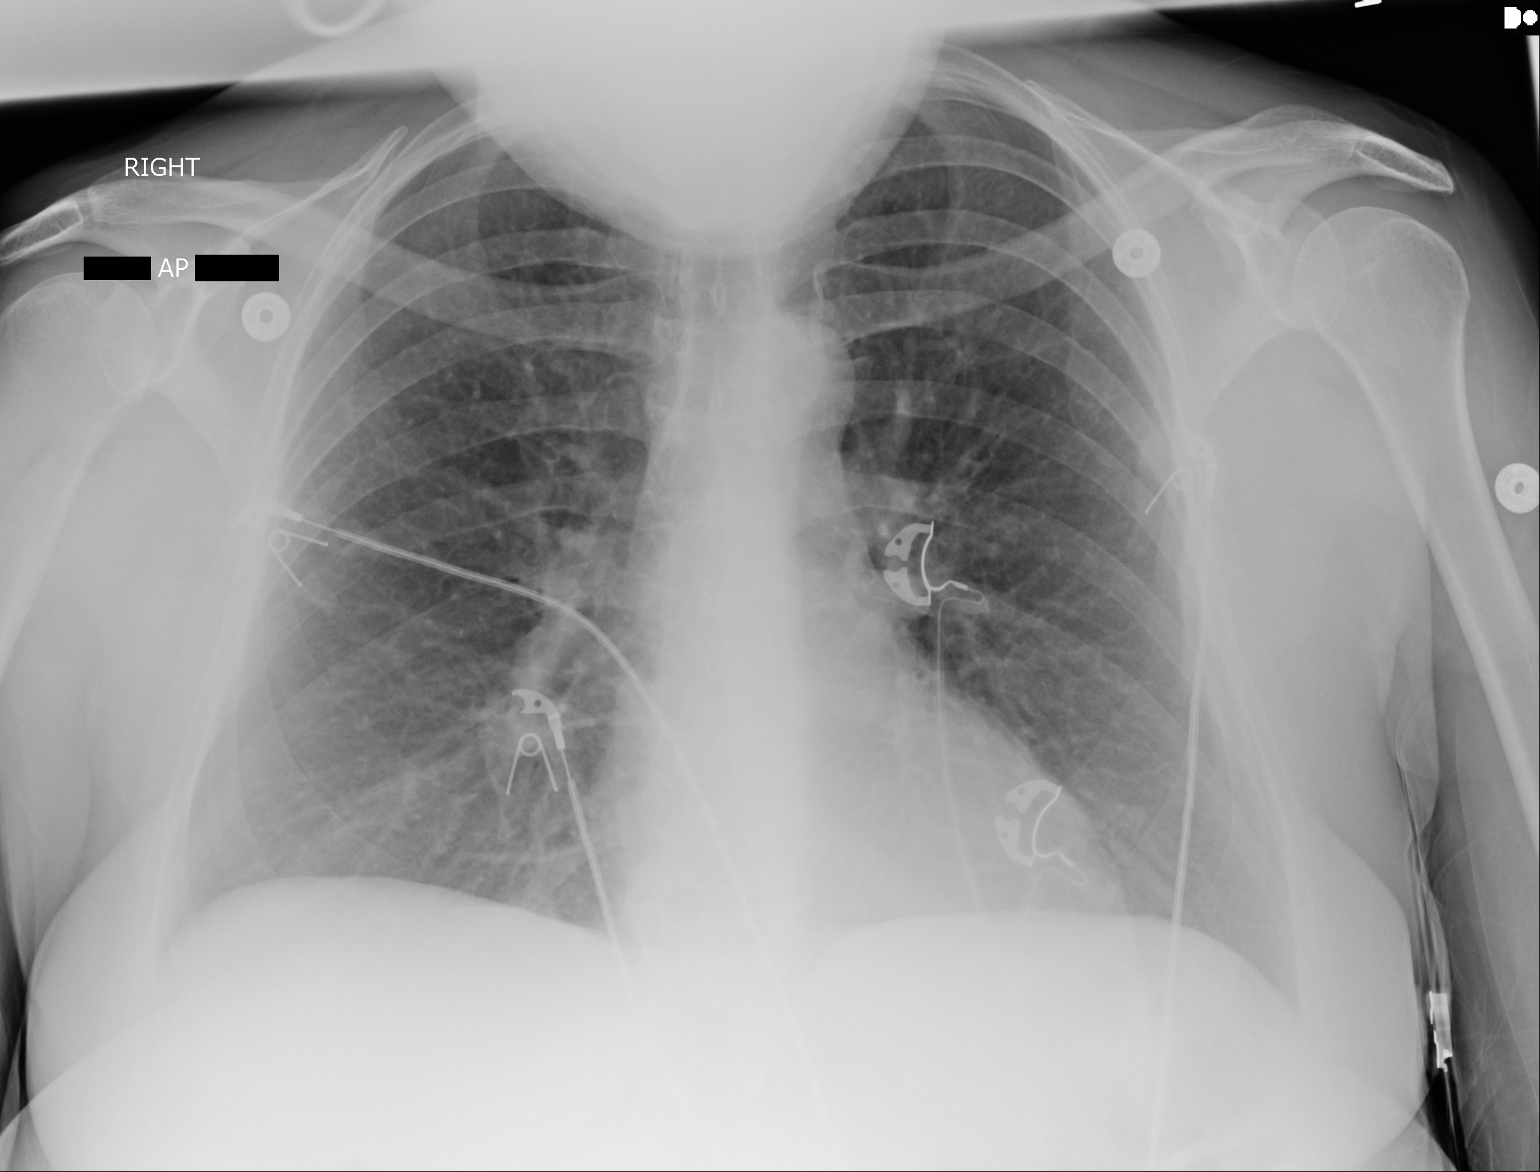

[1 of 1 positions shown; findings below may reference images not displayed]

FINDINGS: Cardiac silhouette is normal in size. No mediastinal or hilar
masses. No evidence of adenopathy.

Clear lungs.  No pleural effusion or pneumothorax.

Skeletal structures are grossly intact.
IMPRESSION: No active disease.

## 2021-07-19 MED ORDER — GABAPENTIN 100 MG PO CAPS
100.0000 mg | ORAL_CAPSULE | Freq: Three times a day (TID) | ORAL | Status: DC
Start: 2021-07-19 — End: 2021-07-21
  Administered 2021-07-19 – 2021-07-21 (×6): 100 mg via ORAL
  Filled 2021-07-19 (×12): qty 1

## 2021-07-19 NOTE — [facility] Group Notes (Signed)
Adult Psychoeducational Group Note ? ?Date:  07/19/2021 ?Time:  2:30 PM ? ?Group Topic/Focus:  ?Wellness Toolbox:   The focus of this group is to discuss various aspects of wellness, balancing those aspects and exploring ways to increase the ability to experience wellness.  Patients will create a wellness toolbox for use upon discharge. ? ?Participation Level:  Active ? ?Participation Quality:  Appropriate ? ?Affect:  Appropriate ? ?Cognitive:  Alert ? ?Insight: Appropriate ? ?Engagement in Group:  Engaged ? ?Modes of Intervention:  Activity ? ?Additional Comments:  Patient attended and participated in the relaxation group activity. ? ?Annie Sable ?07/19/2021, 2:30 PM ?

## 2021-07-19 NOTE — BH IP Treatment Plan (Signed)
Interdisciplinary Treatment and Diagnostic Plan Update ? ?07/19/2021 ?Time of Session: 10:05am  ?[place] ?MRN: [id number] ? ?Principal Diagnosis: Schizoaffective disorder, bipolar type ([place]) ? ?Secondary Diagnoses: Principal Problem: ?  Schizoaffective disorder, bipolar type ([place]) ?Active Problems: ?  Tobacco use disorder ?  Constipation ?  Cocaine abuse ([place]) ?  HTN (hypertension) ? ? ?Current Medications:  ?Current Facility-Administered Medications  ?Medication Dose Route Frequency Provider Last Rate Last Admin  ? acyclovir (ZOVIRAX) 200 MG capsule 400 mg  400 mg Oral BID Armando Reichert, MD   400 mg at 07/19/21 0744  ? albuterol (VENTOLIN HFA) 108 (90 Base) MCG/ACT inhaler 2 puff  2 puff Inhalation Q6H PRN Lindon Romp A, NP      ? alum & mag hydroxide-simeth (MAALOX/MYLANTA) 200-200-20 MG/5ML suspension 30 mL  30 mL Oral Q4H PRN Rozetta Nunnery, NP      ? ARIPiprazole (ABILIFY) tablet 25 mg  25 mg Oral Daily Massengill, Ovid Curd, MD   25 mg at 07/19/21 0745  ? cloNIDine (CATAPRES) tablet 0.1 mg  0.1 mg Oral Q6H PRN Janine Limbo, MD   0.1 mg at 07/17/21 1658  ? clotrimazole (LOTRIMIN) 1 % cream   Topical BID Lavella Hammock, MD   Given at 07/19/21 [phone]  ? fluticasone (FLONASE) 50 MCG/ACT nasal spray 1 spray  1 spray Each Nare BID Lavella Hammock, MD   1 spray at 07/19/21 0744  ? gabapentin (NEURONTIN) capsule 100 mg  100 mg Oral TID Armando Reichert, MD      ? ibuprofen (ADVIL) tablet 400 mg  400 mg Oral Q8H PRN Armando Reichert, MD   400 mg at 07/18/21 1206  ? LORazepam (ATIVAN) tablet 1 mg  1 mg Oral Q6H PRN Massengill, Ovid Curd, MD   1 mg at 07/19/21 1426  ? magnesium hydroxide (MILK OF MAGNESIA) suspension 30 mL  30 mL Oral Daily PRN Lindon Romp A, NP      ? nicotine polacrilex (NICORETTE) gum 2 mg  2 mg Oral PRN Freida Busman, MD      ? OLANZapine zydis (ZYPREXA) disintegrating tablet 5 mg  5 mg Oral Q8H PRN Massengill, Ovid Curd, MD      ? And  ? ziprasidone (GEODON) injection 20 mg  20 mg Intramuscular Q6H  PRN Massengill, Ovid Curd, MD      ? paliperidone (INVEGA) 24 hr tablet 9 mg  9 mg Oral QHS Armando Reichert, MD   9 mg at 07/18/21 2034  ? pantoprazole (PROTONIX) EC tablet 40 mg  40 mg Oral Daily Lindon Romp A, NP   40 mg at 07/19/21 0745  ? polyethylene glycol (MIRALAX / GLYCOLAX) packet 17 g  17 g Oral Daily Armando Reichert, MD   17 g at 07/18/21 0755  ? torsemide (DEMADEX) tablet 10 mg  10 mg Oral Daily Armando Reichert, MD   10 mg at 07/19/21 0745  ? traZODone (DESYREL) tablet 50 mg  50 mg Oral QHS PRN Lindon Romp A, NP      ? traZODone (DESYREL) tablet 50 mg  50 mg Oral QHS Damita Dunnings B, MD   50 mg at 07/18/21 2033  ? triamcinolone 0.1 % cream : eucerin cream, 1:1   Topical TID Janine Limbo, MD   Given at 07/19/21 0935  ? umeclidinium bromide (INCRUSE ELLIPTA) 62.5 MCG/ACT 1 puff  1 puff Inhalation Daily Lindon Romp A, NP   1 puff at 07/19/21 0744  ? ?PTA Medications: ?Medications Prior to Admission  ?Medication Sig Dispense  Refill Last Dose  ? atorvastatin (LIPITOR) 10 MG tablet Take 10 mg by mouth daily.     ? cycloSPORINE (RESTASIS) 0.05 % ophthalmic emulsion 1 drop 2 (two) times daily.     ? diclofenac (VOLTAREN) 75 MG EC tablet Take 75 mg by mouth 2 (two) times daily.     ? DULoxetine (CYMBALTA) 20 MG capsule Take 20 mg by mouth daily.     ? albuterol (VENTOLIN HFA) 108 (90 Base) MCG/ACT inhaler Inhale 2 puffs into the lungs every 6 (six) hours as needed for wheezing or shortness of breath. 1 each 0   ? ARIPiprazole (ABILIFY) 20 MG tablet Take 1 tablet (20 mg total) by mouth daily. 30 tablet 1   ? busPIRone (BUSPAR) 15 MG tablet Take 1 tablet (15 mg total) by mouth 2 (two) times daily. 60 tablet 1   ? citalopram (CELEXA) 40 MG tablet Take 40 mg by mouth daily.     ? cyclobenzaprine (FLEXERIL) 10 MG tablet Take 1 tablet by mouth 2 (two) times daily.     ? esomeprazole (NEXIUM) 40 MG capsule TAKE (1) CAPSULE BYMOUTH ONCE DAILY. 30 capsule 0   ? fluticasone (FLONASE) 50 MCG/ACT nasal spray Place 1 spray  into both nostrils 2 (two) times daily.  2   ? hydrOXYzine (ATARAX/VISTARIL) 25 MG tablet Take 25 mg by mouth 2 (two) times daily as needed.     ? lithium carbonate 300 MG capsule Take 1 capsule (300 mg total) by mouth daily. 30 capsule 1   ? meloxicam (MOBIC) 15 MG tablet Take 15 mg by mouth daily.     ? montelukast (SINGULAIR) 10 MG tablet Take 1 tablet (10 mg total) by mouth at bedtime. 30 tablet 0   ? naloxegol oxalate (MOVANTIK) 12.5 MG TABS tablet Take 1 tablet (12.5 mg total) by mouth daily. 90 tablet 3   ? naloxone (NARCAN) nasal spray 4 mg/0.1 mL Place 2 sprays into the nose as needed.     ? pregabalin (LYRICA) 25 MG capsule Take 25 mg by mouth 3 (three) times daily.     ? SPIRIVA HANDIHALER 18 MCG inhalation capsule INHALE 1 CAPSULE BY MOUTHTDAILY.M     ? SUBOXONE 8-2 MG FILM Take 1 Film by mouth in the morning and at bedtime.     ? tamsulosin (FLOMAX) 0.4 MG CAPS capsule Take 1 capsule (0.4 mg total) by mouth daily. 30 capsule 11   ? torsemide (DEMADEX) 10 MG tablet Take 1 tablet (10 mg total) by mouth daily. 30 tablet 6   ? traZODone (DESYREL) 150 MG tablet Take 1 tablet (150 mg total) by mouth at bedtime as needed for sleep. 30 tablet 1   ? ? ?Patient Stressors: Financial difficulties   ?Health problems   ?Marital or family conflict   ?Medication change or noncompliance   ?Substance abuse   ? ?Patient Strengths: Communication skills  ?General fund of knowledge  ?Motivation for treatment/growth  ?Supportive family/friends  ? ?Treatment Modalities: Medication Management, Group therapy, Case management,  ?1 to 1 session with clinician, Psychoeducation, Recreational therapy. ? ? ?Physician Treatment Plan for Primary Diagnosis: Schizoaffective disorder, bipolar type ([place]) ?Long Term Goal(s): Improvement in symptoms so as ready for discharge  ? ?Short Term Goals: Compliance with prescribed medications will improve ?Ability to verbalize feelings will improve ?Ability to disclose and discuss suicidal  ideas ?Ability to demonstrate self-control will improve ? ?Medication Management: Evaluate patient's response, side effects, and tolerance of medication regimen. ? ?Therapeutic Interventions: 1  to 1 sessions, Unit Group sessions and Medication administration. ? ?Evaluation of Outcomes: Progressing ? ?Physician Treatment Plan for Secondary Diagnosis: Principal Problem: ?  Schizoaffective disorder, bipolar type ([place]) ?Active Problems: ?  Tobacco use disorder ?  Constipation ?  Cocaine abuse ([place]) ?  HTN (hypertension) ? ?Long Term Goal(s): Improvement in symptoms so as ready for discharge  ? ?Short Term Goals: Compliance with prescribed medications will improve ?Ability to verbalize feelings will improve ?Ability to disclose and discuss suicidal ideas ?Ability to demonstrate self-control will improve    ? ?Medication Management: Evaluate patient's response, side effects, and tolerance of medication regimen. ? ?Therapeutic Interventions: 1 to 1 sessions, Unit Group sessions and Medication administration. ? ?Evaluation of Outcomes: Progressing ? ? ?RN Treatment Plan for Primary Diagnosis: Schizoaffective disorder, bipolar type ([place]) ?Long Term Goal(s): Knowledge of disease and therapeutic regimen to maintain health will improve ? ?Short Term Goals: Ability to remain free from injury will improve, Ability to participate in decision making will improve, Ability to verbalize feelings will improve, Ability to disclose and discuss suicidal ideas, and Ability to identify and develop effective coping behaviors will improve ? ?Medication Management: RN will administer medications as ordered by provider, will assess and evaluate patient's response and provide education to patient for prescribed medication. RN will report any adverse and/or side effects to prescribing provider. ? ?Therapeutic Interventions: 1 on 1 counseling sessions, Psychoeducation, Medication administration, Evaluate responses to treatment, Monitor vital signs  and CBGs as ordered, Perform/monitor CIWA, COWS, AIMS and Fall Risk screenings as ordered, Perform wound care treatments as ordered. ? ?Evaluation of Outcomes: Progressing ? ? ?LCSW Treatment Plan for Primary D

## 2021-07-19 NOTE — Group Note (Signed)
[facility] LCSW Group Therapy ? ? ?Type of Therapy and Topic:  Group Therapy:  Wellness ? ? ?Participation Level: Did Not Attend ? ?Description of Group: ?This group allows individuals to explore the 6 dimensions of wellness, including spiritual, emotional, intellectual, physical, social, environmental, financial and spiritual. Patients will learn to different ways to practice wellness to improve well-being. Patients also participated in a conversation about what wellness means to them.   Individuals will think about ways in which they currently practice wellness as well as ways they can improve their wellness and new ways to practice wellness.   ? ? ?  ?Therapeutic Goals ?Patient will verbalize 1 pr 2 we;;mess areas where they are doing well. ?Patient will identify 2 areas where they would like to improve their wellness.   ?Patient will provide a definition of what wellness means to them.  ?Patients will reflect on current hospitalization and primary areas to maintain mental health to prevent re-hospitalization.  ?  ? ?Summary of Patient Progress:  Did not attend ? ?  ?  ? ?Therapeutic Modalities ?Cognitive Behavioral Therapy ?Motivational Interviewing ? ? ?Shelda Truby, LCSW, LCAS ?Clincal Social Worker  ?[facility] ? ? ? ?

## 2021-07-19 NOTE — Progress Notes (Signed)
Pt refused gabapentin saying "I wasn't on this shit before and I'm not starting now". RN attempted to educate pt on benefits of medication but pt expressed no interest in learning only on receiving ativan. Pt is very irritable and says "all these people are going to be sorry when I take their licenses". Patient remains safe on q15 min checks. ?

## 2021-07-19 NOTE — Progress Notes (Signed)
?   07/19/21 0300  ?Psychosocial Assessment  ?Patient Complaints Anxiety  ?Eye Contact Fair  ?Facial Expression Anxious;Worried  ?Affect Appropriate to circumstance  ?Speech Logical/coherent  ?Ambulance person;Assertive  ?Motor Activity Slow  ?Appearance/Hygiene Unremarkable  ?Behavior Characteristics Cooperative  ?Mood Depressed;Irritable;Preoccupied  ?Thought Process  ?Coherency Tangential;Disorganized  ?Content Paranoia;Preoccupation  ?Delusions Paranoid  ?Perception Hallucinations  ?Hallucination Visual;Auditory  ?Judgment Impaired  ?Confusion None  ?Danger to Self  ?Current suicidal ideation? Denies  ?Danger to Others  ?Danger to Others None reported or observed  ? ? ?

## 2021-07-19 NOTE — Progress Notes (Signed)
?   07/19/21 0500  ?Sleep  ?Number of Hours 8.25  ? ? ?
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
## 2021-07-19 NOTE — Progress Notes (Signed)
Patient appears irritable. Patient denies SI/HI. Patient endorses hallucinations. Pt states that "I am seeing spots and hearing conversations in the background, but its too quiet to understand". Pt goal for the day is "getting home to my sick husband he is dying of cancer". Patient reported her anxiety was a 7/10, depression a 10/10, and hopelessness a 7/10. Patient complied with morning medication with no reported side effects. Patient requested PRNs due to increased irritability. She states she is frustrated and wants to go home and doesn't want to be here. She also reported 8/10 pain that was treated with medication which she said was effective. She states the pain is from her kidney stones and is preoccupied with receiving the results of her urinalysis.  Patient remains safe on Q70mn checks and contracts for safety.  ? ? ? ? ? 07/19/21 0745  ?Psychosocial Assessment  ?Patient Complaints Anxiety  ?Eye Contact Fair  ?Facial Expression Anxious;Worried;Angry  ?Affect Irritable;Preoccupied  ?Speech Logical/coherent  ?Interaction Assertive;Demanding  ?Motor Activity Slow  ?Appearance/Hygiene Unremarkable  ?Behavior Characteristics Cooperative  ?Mood Depressed;Irritable;Preoccupied  ?Thought Process  ?Coherency Tangential;Disorganized  ?Content Paranoia;Preoccupation  ?Delusions Paranoid  ?Perception Hallucinations  ?Hallucination Visual;Auditory  ?Judgment Impaired  ?Confusion None  ?Danger to Self  ?Current suicidal ideation? Denies  ?Danger to Others  ?Danger to Others None reported or observed  ? ? ?
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
## 2021-07-19 NOTE — Progress Notes (Signed)
Pt decided to take gabapentin and RN educated pt about medication. ?

## 2021-07-19 NOTE — Plan of Care (Signed)
?  Problem: Education: ?Goal: Knowledge of [facility] Education information/materials will improve ?Outcome: Progressing ?Goal: Verbalization of understanding the information provided will improve ?Outcome: Progressing ?  ?Problem: Coping: ?Goal: Ability to verbalize frustrations and anger appropriately will improve ?Outcome: Progressing ?  ?Problem: Education: ?Goal: Emotional status will improve ?Outcome: Not Progressing ?Goal: Mental status will improve ?Outcome: Not Progressing ?  ?Problem: Activity: ?Goal: Sleeping patterns will improve ?Outcome: Not Progressing ?  ?Problem: Health Behavior/Discharge Planning: ?Goal: Identification of resources available to assist in meeting health care needs will improve ?Outcome: Not Progressing ?  ?

## 2021-07-20 MED ORDER — PALIPERIDONE ER 6 MG PO TB24
6.0000 mg | ORAL_TABLET | Freq: Every day | ORAL | Status: DC
  Administered 2021-07-20: 6 mg via ORAL
  Filled 2021-07-20 (×3): qty 1

## 2021-07-20 MED ORDER — LORAZEPAM 0.5 MG PO TABS
0.5000 mg | ORAL_TABLET | Freq: Four times a day (QID) | ORAL | Status: DC | PRN
  Administered 2021-07-21: 0.5 mg via ORAL
  Filled 2021-07-20: qty 1

## 2021-07-20 MED ORDER — PALIPERIDONE PALMITATE ER 234 MG/1.5ML IM SUSY
234.0000 mg | PREFILLED_SYRINGE | Freq: Once | INTRAMUSCULAR | Status: AC
  Administered 2021-07-20: 234 mg via INTRAMUSCULAR
  Filled 2021-07-20: qty 1.5

## 2021-07-20 MED ORDER — AMLODIPINE BESYLATE 5 MG PO TABS
5.0000 mg | ORAL_TABLET | Freq: Every day | ORAL | Status: DC
  Administered 2021-07-20 – 2021-07-22 (×3): 5 mg via ORAL
  Filled 2021-07-20 (×6): qty 1

## 2021-07-20 NOTE — Progress Notes (Signed)
Addilyn was isolative to her room.  She was irritable and paranoid.  Minimal with staff.  She didn't want to take her Lorayne Bender this evening because "I already got the shot today."  Explained her needing to be on PO Invega while the injectable medication becomes effective.  She did finally take her bedtime medication.  She denied SI/HI or VH.  She did not attend evening wrap up group but did get up to receive snack.  Q 15 minute checks maintained for safety.  She remains safe on the unit. ? ? 07/20/21 2104  ?Psych Admission Type (Psych Patients Only)  ?Admission Status Voluntary  ?Psychosocial Assessment  ?Patient Complaints Irritability;Isolation  ?Eye Contact Brief  ?Facial Expression Anxious  ?Affect Irritable  ?Speech Logical/coherent  ?Interaction Assertive;Minimal  ?Motor Activity Slow  ?Appearance/Hygiene Disheveled  ?Behavior Characteristics Irritable  ?Mood Irritable  ?Thought Process  ?Coherency Tangential  ?Content Paranoia;Preoccupation  ?Delusions Paranoid  ?Perception Hallucinations  ?Hallucination Auditory  ?Judgment Impaired  ?Confusion None  ?Danger to Self  ?Current suicidal ideation? Denies  ?Danger to Others  ?Danger to Others None reported or observed  ? ? ?

## 2021-07-20 NOTE — B[facility] Group Notes (Signed)
Adult Goals Group Note ? ?Date:  07/20/2021 ?Time:  10:11 AM ? ?Group Topic/Focus:  ?Goals Group:   The focus of this group is to help patients establish daily goals to achieve during treatment and discuss how the patient can incorporate goal setting into their daily lives to aide in recovery. ? ?Participation Level:  Did Not Attend ? ? ?Meredith [name] ?07/20/2021, 10:11 AM ?

## 2021-07-20 NOTE — Progress Notes (Signed)
?   07/20/21 0500  ?Psych Admission Type (Psych Patients Only)  ?Admission Status Voluntary  ?Psychosocial Assessment  ?Patient Complaints Anxiety  ?Eye Contact Fair  ?Facial Expression Angry;Anxious;Worried  ?Affect Irritable;Preoccupied  ?Speech Logical/coherent  ?Interaction Assertive;Demanding  ?Motor Activity Slow  ?Appearance/Hygiene Unremarkable  ?Behavior Characteristics Cooperative  ?Mood Depressed;Preoccupied  ?Thought Process  ?Coherency Tangential;Disorganized  ?Content Paranoia;Preoccupation  ?Delusions Paranoid  ?Perception Hallucinations  ?Hallucination Visual;Auditory  ?Judgment Impaired  ?Confusion None  ?Danger to Self  ?Current suicidal ideation? Denies  ?Danger to Others  ?Danger to Others None reported or observed  ? ? ?

## 2021-07-20 NOTE — Group Note (Signed)
Recreation Therapy Group Note ? ? ?Group Topic:Self-Esteem  ?Group Date: 07/20/2021 ?Start Time: [phone] ?End Time: 1045 ?Facilitators: Victorino Sparrow, LRT,CTRS ?Location: [place] ? ? ?Goal Area(s) Addresses:  ?Patient will be able to identify self esteem is. ?Patient will successfully share benefits of healthy self esteem. ?Patient will identify how they see themselves. ?   ? Group Description: Patient and LRT discussed what self esteem is and how impacts each individual. Patients were given a sheet of paper with blank faces on it.  Patients were to create an image of how they see themselves by using words, images or both. Patients were given colored pencils, crayons and pencils to complete the assignment. Patients shared their completed assignment with each other.  ? ? ?Affect/Mood: Appropriate ?  ?Participation Level: Engaged ?  ?Participation Quality: Independent ?  ?Behavior: Appropriate ?  ?Speech/Thought Process: Focused ?  ?Insight: Good ?  ?Judgement: Good ?  ?Modes of Intervention: Art and Music ?  ?Patient Response to Interventions:  Engaged ?  ?Education Outcome: ? Acknowledges education and In group clarification offered   ? ?Clinical Observations/Individualized Feedback: Pt was pleasant, attentive and engaged.  Pt did express before the activity she needed to keep herself occupied more.  Pt was able to concentrate and focus during activity.  Pt described herself as having a lot of imagination, treats others how she wants to be treated, a good person most of the time, cares about people, act crazy sometimes and is little more serious now than previously now that she's older.  ? ? ?Plan: Continue to engage patient in RT group sessions 2-3x/week. ? ? ?Victorino Sparrow, LRT,CTRS ?07/20/2021 11:27 AM ?

## 2021-07-20 NOTE — Progress Notes (Signed)
?   07/20/21 1312  ?Psych Admission Type (Psych Patients Only)  ?Admission Status Voluntary  ?Psychosocial Assessment  ?Patient Complaints Irritability  ?Eye Contact Fair  ?Facial Expression Anxious;Worried  ?Affect Irritable  ?Speech Logical/coherent  ?Interaction Assertive;Demanding  ?Motor Activity Slow  ?Appearance/Hygiene Unremarkable  ?Behavior Characteristics Irritable;Cooperative  ?Mood Depressed;Irritable  ?Thought Process  ?Coherency Tangential  ?Content Paranoia;Preoccupation  ?Delusions Paranoid  ?Perception Hallucinations  ?Hallucination Auditory  ?Judgment Impaired  ?Confusion None  ?Danger to Self  ?Current suicidal ideation? Denies  ?Danger to Others  ?Danger to Others None reported or observed  ? ? ?

## 2021-07-20 NOTE — Plan of Care (Signed)
?  Problem: Activity: ?Goal: Interest or engagement in activities will improve ?Outcome: Progressing ?  ?Problem: Coping: ?Goal: Ability to verbalize frustrations and anger appropriately will improve ?Outcome: Progressing ?  ?Problem: Health Behavior/Discharge Planning: ?Goal: Compliance with treatment plan for underlying cause of condition will improve ?Outcome: Progressing ?  ?Problem: Safety: ?Goal: Periods of time without injury will increase ?Outcome: Progressing ?  ?
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
## 2021-07-20 NOTE — Progress Notes (Signed)
?   07/20/21 0536  ?Sleep  ?Number of Hours 7.5  ? ? ?

## 2021-07-21 MED ORDER — LORAZEPAM 0.5 MG PO TABS
0.5000 mg | ORAL_TABLET | Freq: Two times a day (BID) | ORAL | Status: DC | PRN
  Administered 2021-07-22: 0.5 mg via ORAL
  Filled 2021-07-21: qty 1

## 2021-07-21 MED ORDER — GABAPENTIN 100 MG PO CAPS
200.0000 mg | ORAL_CAPSULE | Freq: Three times a day (TID) | ORAL | Status: DC
  Administered 2021-07-21 – 2021-07-22 (×4): 200 mg via ORAL
  Filled 2021-07-21 (×9): qty 2

## 2021-07-21 NOTE — Group Note (Signed)
[facility] LCSW Group Therapy Note ? ?Date/Time:  1:00pm ? ?Type of Therapy and Topic:  Group Therapy:  Strengths and Qualities ? ?Participation Level:  Active ? ?Description of Group:   ? In this group patients will be asked to explore and define the terms strength ans qualities.  Patients will be guided to discuss their thoughts, feelings, and behaviors as to where strengths and qualities originate. Participants will then list some of their strengths and qualities related to each subject topic. This group will be process-oriented, with patients participating in exploration of their own experiences as well as giving and receiving support and challenge from other group members. ? ?Therapeutic Goals: ?Patient will identify specific strengths related to their personal life. ?Patient will identify feelings, thoughts, and beliefs about strengths and qualities. ?Patient will identify ways their strengths have been used. Marland Kitchen ?Patient will identify situations where they have helped others or made someone else happy. . ? ?Summary of Patient Progress ?Patient was appropriate in group. Patient was able identify her husband and the ability to ask for help as a strength.  She participated in group activity and had good insight into group topic.  Patient received and provided feedback to other group members.  ? ? ? ? ?Therapeutic Modalities:   ?Cognitive Behavioral Therapy ?Solution Focused Therapy ?Motivational Interviewing ?Brief Therapy ? ? ?Unika Nazareno, LCSW, LCAS ?Clincal Social Worker  ?[facility] ? ? ?

## 2021-07-21 NOTE — Progress Notes (Signed)
Pt was encouraged but didn't attend orientation/goals group. ?

## 2021-07-21 NOTE — Progress Notes (Signed)
Jontae was pleasant and cooperative this evening.  She came out of her room for snack and bedtime medications.  She apologized for her behaviors since her admission.  She remains paranoid but better than last night.  She denied SI/HI or AVH.  She stated she is looking forward to being discharged but is a little nervous about staying away from drugs.  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  She took her medications without difficulty.  It was too early to give her lorazepam prn as requested, but she stated that if she needs it later she will ask.  She is currently resting with her eyes closed and appears to be asleep.  Q 15 minute checks maintained for safety.  She remains safe on the unit. ? ? 07/21/21 2059  ?Psych Admission Type (Psych Patients Only)  ?Admission Status Voluntary  ?Psychosocial Assessment  ?Patient Complaints Anxiety  ?Eye Contact Fair  ?Facial Expression Anxious  ?Affect Appropriate to circumstance  ?Speech Logical/coherent  ?Interaction Assertive  ?Motor Activity Other (Comment) ?(Unremarkable)  ?Appearance/Hygiene Unremarkable  ?Behavior Characteristics Cooperative;Anxious  ?Mood Anxious  ?Thought Process  ?Coherency Disorganized  ?Content Blaming others  ?Delusions Paranoid  ?Perception WDL  ?Hallucination None reported or observed  ?Judgment Impaired  ?Confusion None  ?Danger to Self  ?Current suicidal ideation? Denies  ?Danger to Others  ?Danger to Others None reported or observed  ? ? ?
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
## 2021-07-21 NOTE — Group Note (Signed)
Recreation Therapy Group Note ? ? ?Group Topic:Communication  ?Group Date: 07/21/2021 ?Start Time: 1000 ?End Time: 9323 ?Facilitators: Victorino Sparrow, LRT,CTRS ?Location: [place] ? ? ?oal Area(s) Addresses:  ?Patient will effectively listen to complete activity.  ?Patient will identify communication skills used to make activity successful.  ?Patient will identify how skills used during activity can be used to reach post d/c goals.  ?  ?Group Description:  Geometric Drawings.  Three volunteers from the peer group will be shown an abstract picture with a particular arrangement of geometrical shapes.  Each round, one 'speaker' will describe the pattern, as accurately as possible without revealing the image to the group.  The remaining group members will listen and draw the picture to reflect how it is described to them. Patients with the role of 'listener' cannot ask clarifying questions but, may request that the speaker repeat a direction. Once the drawings are complete, the presenter will show the rest of the group the picture and compare how close each person came to drawing the picture. LRT will facilitate a post-activity discussion regarding effective communication and the importance of planning, listening, and asking for clarification in daily interactions with others. ? ? ?Affect/Mood: Appropriate ?  ?Participation Level: Moderate ?  ?Participation Quality: Independent ?  ?Behavior: Appropriate and Disruptive ?  ?Speech/Thought Process: Distracted ?  ?Insight: Fair ?  ?Judgement: Fair  ?  ?Modes of Intervention: Drawing ?  ?Patient Response to Interventions:  Attentive ?  ?Education Outcome: ? Acknowledges education and In group clarification offered   ? ?Clinical Observations/Individualized Feedback: Pt expressed communication could be done through the eyes.  Pt went on to say miscommunications happen when a person steals something and blames it on others.  Pt attempted to complete the first drawing.   Pt was called out of group to meet.  Pt began explaining her leaving group and was talking over the presenter.  Pt was redirected to go ahead and leave group.  ? ? ?Plan: Continue to engage patient in RT group sessions 2-3x/week. ? ? ?Victorino Sparrow, LRT,CTRS ?07/21/2021 1:43 PM ?

## 2021-07-21 NOTE — Progress Notes (Signed)
?   07/21/21 1110  ?Psych Admission Type (Psych Patients Only)  ?Admission Status Voluntary  ?Psychosocial Assessment  ?Patient Complaints Irritability  ?Eye Contact Brief  ?Facial Expression Anxious  ?Affect Irritable  ?Speech Logical/coherent  ?Interaction Assertive;Demanding  ?Motor Activity Fidgety  ?Appearance/Hygiene Unremarkable  ?Behavior Characteristics Irritable  ?Mood Irritable  ?Thought Process  ?Coherency Disorganized  ?Content Blaming others  ?Delusions Paranoid  ?Perception Hallucinations  ?Hallucination Auditory  ?Judgment Impaired  ?Confusion None  ?Danger to Self  ?Current suicidal ideation? Denies  ?Danger to Others  ?Danger to Others None reported or observed  ? ? ?

## 2021-07-22 MED ORDER — TRIAMCINOLONE 0.1 % CREAM:EUCERIN CREAM 1:1: 1.0000 "application " | TOPICAL_CREAM | Freq: Three times a day (TID) | CUTANEOUS | Status: DC

## 2021-07-22 MED ORDER — AMLODIPINE BESYLATE 5 MG PO TABS: 5.0000 mg | ORAL_TABLET | Freq: Every day | ORAL | 0 refills | Status: DC

## 2021-07-22 MED ORDER — GABAPENTIN 100 MG PO CAPS: 200.0000 mg | ORAL_CAPSULE | Freq: Three times a day (TID) | ORAL | 0 refills | Status: DC

## 2021-07-22 MED ORDER — ATORVASTATIN CALCIUM 10 MG PO TABS: 10.0000 mg | ORAL_TABLET | Freq: Every day | ORAL | 0 refills | Status: AC

## 2021-07-22 MED ORDER — PALIPERIDONE PALMITATE ER 156 MG/ML IM SUSY: 156.0000 mg | PREFILLED_SYRINGE | Freq: Once | INTRAMUSCULAR | 0 refills | Status: DC

## 2021-07-22 MED ORDER — ARIPIPRAZOLE 5 MG PO TABS: 25.0000 mg | ORAL_TABLET | Freq: Every day | ORAL | 0 refills | Status: DC

## 2021-07-22 MED ORDER — CLOTRIMAZOLE 1 % EX CREA: TOPICAL_CREAM | Freq: Two times a day (BID) | CUTANEOUS | 0 refills | Status: AC

## 2021-07-22 MED ORDER — NICOTINE POLACRILEX 2 MG MT GUM: 2.0000 mg | CHEWING_GUM | OROMUCOSAL | 0 refills | Status: AC | PRN

## 2021-07-22 MED ORDER — PALIPERIDONE PALMITATE ER 156 MG/ML IM SUSY: 156.0000 mg | PREFILLED_SYRINGE | Freq: Once | INTRAMUSCULAR | Status: DC

## 2021-07-22 MED ORDER — TORSEMIDE 10 MG PO TABS: 10.0000 mg | ORAL_TABLET | Freq: Every day | ORAL | 0 refills | Status: DC

## 2021-07-22 MED ORDER — ACYCLOVIR 200 MG PO CAPS: 400.0000 mg | ORAL_CAPSULE | Freq: Two times a day (BID) | ORAL | 0 refills | Status: AC

## 2021-07-22 MED ORDER — TRAZODONE HCL 50 MG PO TABS: 50.0000 mg | ORAL_TABLET | Freq: Every day | ORAL | 0 refills | Status: DC

## 2021-07-22 NOTE — Progress Notes (Addendum)
Patient ID: Meredith [name], female   DOB: June 29, 1965, 56 y.o.   MRN: [id number] ? ?Pt ambulatory, alert, and oriented X4 on and off the unit. Education, support, and encouragement provided. Discharge summary/AVS, prescriptions, medications, and follow up appointments reviewed with pt and a copy of the AVS was given to pt. Suicide prevention resources provided. Pt's belongings in locker #7 returned and belongings sheet signed. Pt denies SI/HI, AVH, pain, or any concerns at this time. Pt discharged to lobby to her significant other to be transported to her destination. ?

## 2021-07-22 NOTE — [facility] Group Notes (Signed)
[facility] Group Notes:  (Nursing/MHT/Case Management/Adjunct) ? ?Date:  07/22/2021  ?Time:  1:19 PM ? ?Type of Therapy:  Orientation/Goals group ? ?Participation Level:  Active ? ?Participation Quality:  Appropriate ? ?Affect:  Appropriate ? ?Cognitive:  Appropriate ? ?Insight:  Appropriate ? ?Engagement in Group:  Engaged and Improving ? ?Modes of Intervention:  Discussion, Education, Orientation, and Support ? ?Summary of Progress/Problems: Pt goal for today is to get discharged and to go back home to family.  ? ?Meredith [name] Shear ?07/22/2021, 1:19 PM ?

## 2021-07-22 NOTE — Progress Notes (Signed)
?  [facility] Adult Case Management Discharge Plan : ? ?Will you be returning to the same living situation after discharge:  Yes,  Home  ?At discharge, do you have transportation home?: Yes,  [facility]  ?Do you have the ability to pay for your medications: Yes,  Medicaid  ? ?Release of information consent forms completed and in the chart;  Patient's signature needed at discharge. ? ?Patient to Follow up at: ? Follow-up Information   ? ? Services, [facility]. Go on 07/27/2021.   ?Why: You have a hospital follow up appointment for therapy services on 07/27/21 at 12:00 pm.  This appointment will be held in person. ?Contact information: ?[address] ?[place] [address] ?[phone] ? ? ?  ?  ? ? [facility] Follow up on 07/28/2021.   ?Why: You have an appointment for medication management services on 07/28/21 at 9:10 am.  This appointment will be Virtual. ?Contact information: ?[address] ?[address] ?[place] [address] ?[phone] ? ? ?  ?  ? ? [facility]. Call.   ?Why: Please contact this agency to schedule an appointment at your earliest convenience. ?Contact information: ?Address: [address], [place], [place] [address] ? ?Phone: [phone] ? ?  ?  ? ? [facility]. Call.   ?Why: Please contact your PCP as soon as possible to schedule an appointment ?Contact information: ?Address: [address] [place], [place] [address] ? ?Phone: [phone] ?Fax: [phone] ? ?  ?  ? ?  ?  ? ?  ? ? ?Next level of care provider has access to [facility] ? ?Safety Planning and Suicide Prevention discussed: Yes,  with patient ? ?  ? ?Has patient been referred to the Quitline?: Patient refused referral ? ?Patient has been referred for addiction treatment: Pt. refused referral ? ?Darleen Crocker, LCSWA ?07/22/2021, 9:53 AM ?

## 2021-07-22 NOTE — Group Note (Signed)
Recreation Therapy Group Note ? ? ?Group Topic:Team Building  ?Group Date: 07/22/2021 ?Start Time: 1000 ?End Time: 7989 ?Facilitators: Victorino Sparrow, LRT,CTRS ?Location: [facility] ? ? ?Goal Area(s) Addresses:  ?Patient will effectively work with peer towards shared goal.  ?Patient will identify skills used to make activity successful.  ?Patient will identify how skills used during activity can be used to reach post d/c goals.  ? ?Group Description:  Landing Pad. In teams of 3-5, patients were given 12 plastic drinking straws and an equal length of masking tape. Using the materials provided, patients were asked to build a landing pad to catch a golf ball dropped from approximately 5 feet in the air. All materials were required to be used by the team in their design. LRT facilitated post-activity discussion. ? ? ?Affect/Mood: N/A ?  ?Participation Level: Did not attend ?  ? ?Clinical Observations/Individualized Feedback:   ? ? ?Plan: Continue to engage patient in RT group sessions 2-3x/week. ? ? ?Victorino Sparrow, LRT,CTRS ?07/22/2021 12:10 PM ?

## 2021-07-22 NOTE — Discharge Instructions (Signed)
?-  Follow-up with your outpatient psychiatric provider -instructions on appointment date, time, and address (location) are provided to you in discharge paperwork. ? ?-Take your psychiatric medications as prescribed at discharge - instructions are provided to you in the discharge paperwork ?Invega sustenna 156 mg IM LAI due on 07-27-21.  ? ?-Follow-up with outpatient primary care doctor and other specialists -for management of chronic medical disease ? ?-Recommend abstinence from alcohol, tobacco, and other illicit drug use at discharge.  ? ?-If your psychiatric symptoms recur, worsen, or if you have side effects to your psychiatric medications, call your outpatient psychiatric provider, 911, [phone] or go to the nearest emergency department. ? ?-If suicidal thoughts recur, call your outpatient psychiatric provider, 911, [phone] or go to the nearest emergency department. ? ?
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
## 2021-07-22 NOTE — [facility] Suicide Risk Assessment (Addendum)
Suicide Risk Assessment ? ?Discharge Assessment    ?[facility] Discharge Suicide Risk Assessment ? ? ?Principal Problem: Schizoaffective disorder, bipolar type ([place]) ?Discharge Diagnoses: Principal Problem: ?  Schizoaffective disorder, bipolar type ([place]) ?Active Problems: ?  Tobacco use disorder ?  Constipation ?  Cocaine abuse ([place]) ?  HTN (hypertension) ? ? ?Total Time spent with patient: 30 minutes ? ?Meredith [name] is a 56 yo patient with PPH of schizoaffective disorder, bipolar type , stimulant use disorder, and opioid use disorder who presented to [facility] endorsing that she believed someone was targeting her in her home and that she had been seeing things that her partner was not able.  ? ?During the patient's hospitalization, patient had extensive initial psychiatric evaluation, and follow-up psychiatric evaluations every day. ? ?Psychiatric diagnoses provided upon initial assessment:  ?Schizoaffective disorder, bipolar type ?Stimulant use disorder, cocaine, severe ? ?Patient's psychiatric medications were adjusted on admission:  ?- Start Abilify '15mg'$  daily ?- Start Buspar '10mg'$  BID, anxiety and prevention of akathisia ?- Trazodone '50mg'$  QHS  ?- Started COWS with PRN Clonidine, Atarax, due to concern for possible fentanyl withdrawal ?- Ativan '1mg'$  q6h PRN ? ?During the hospitalization, other adjustments were made to the patient's psychiatric medication regimen:  ?- Increase Abilify to '25mg'$  for psychosis and paranoia ?- Discontinued Buspar due  ?- Started gabapentin and increased to '200mg'$  TID ?- Started Zyprexa but was discontinued due to failure and likley need for LAI ?- Started Invega '9mg'$  QHS for psychosis and paranoia ?- Received [place] '234mg'$  4/18 ?- Continued Trazodone '50mg'$  QHS ?- Ativan decreased to 0.'5mg'$  PO q6h PRN and then 0.'5mg'$  BID PRN ? ?Gradually, patient started adjusting to milieu.   ?Patient's care was discussed during the interdisciplinary team meeting every day during the hospitalization. ? ?The  patient denied having side effects to prescribed psychiatric medication. ? ?The patient reports their target psychiatric symptoms of paranoia, disorganization, and irritability responded well to the psychiatric medications, and the patient reports overall benefit other psychiatric hospitalization. Patient continued to have level of delusions but is more logical in thought process and based in reality. Supportive psychotherapy was provided to the patient. The patient also participated in regular group therapy while admitted.  ? ?Labs were reviewed with the patient, and abnormal results were discussed with the patient. ? ?The patient denied having suicidal thoughts more than 48 hours prior to discharge.  Patient denies having homicidal thoughts.  Patient denies having auditory hallucinations.  Patient denies any visual hallucinations.  Patient denies having paranoid thoughts. ? ?The patient is able to verbalize their individual safety plan to this provider. ? ?It is recommended to the patient to continue psychiatric medications as prescribed, after discharge from the hospital.   ? ?It is recommended to the patient to follow up with your outpatient psychiatric provider and PCP. ? ?Discussed with the patient, the impact of alcohol, drugs, tobacco have been there overall psychiatric and medical wellbeing, and total abstinence from substance use was recommended the patient.  ? ? ?Musculoskeletal: ?Strength & Muscle Tone: within normal limits ?Gait & Station: normal ?Patient leans: N/A ? ?Psychiatric Specialty Exam ? ?Presentation  ?General Appearance: Fairly Groomed ? ?Eye Contact:Good ? ?Speech:-- (rapid but not pressured, talkative) ? ?Speech Volume:Normal ? ?Handedness:No data recorded ? ?Mood and Affect  ?Mood:Anxious; Euthymic ? ?Duration of Depression Symptoms: No data recorded ?Affect:Congruent; Full Range ? ? ?Thought Process  ?Thought Processes:Linear ? ?Descriptions of Associations:Intact ? ?Orientation:Full  (Time, Place and Person) ? ?Thought Content:Paranoid Ideation ? ?History  of Schizophrenia/Schizoaffective disorder:Yes ? ?Duration of Psychotic Symptoms:Greater than six months ? ?Hallucinations:Hallucinations: None ?Description of Auditory Hallucinations: Off-and-on, did tell her that they are going to harm her but she is able to resist impulses ?Description of Visual Hallucinations: Denies ? ?Ideas of Reference:Paranoia; Delusions ? ?Suicidal Thoughts:Suicidal Thoughts: No ? ?Homicidal Thoughts:Homicidal Thoughts: No ? ? ?Sensorium  ?Memory:Immediate Good; Recent Good; Remote Good ? ?Judgment:Fair ? ?Insight:Fair ? ? ?Executive Functions  ?Concentration:Fair ? ?Attention Span:Fair ? ?Recall:Poor ? ?Fund of Knowledge:Poor ? ?Language:Fair ? ? ?Psychomotor Activity  ?Psychomotor Activity:Psychomotor Activity: Normal (no eps. aims score zero) ? ? ?Assets  ?Assets:Housing; Social Support ? ? ?Sleep  ?Sleep:Sleep: Fair ?Number of Hours of Sleep: 6.5 ? ? ?Physical Exam: ?Physical Exam ?Constitutional:   ?   Appearance: Normal appearance.  ?HENT:  ?   Head: Normocephalic and atraumatic.  ?Pulmonary:  ?   Effort: Pulmonary effort is normal.  ?Neurological:  ?   Mental Status: She is alert and oriented to person, place, and time.  ? ?Review of Systems  ?Psychiatric/Behavioral:  Negative for suicidal ideas. The patient does not have insomnia.   ?Blood pressure (!) 129/100, pulse 90, temperature 97.6 ?F (36.4 ?C), temperature source Oral, resp. rate 16, height '5\' 5"'$  (1.651 m), weight 74.3 kg, SpO2 100 %. Body mass index is 27.26 kg/m?. ? ?Mental Status Per Nursing Assessment::   ?On Admission:  NA ? ?Demographic Factors:  ?Caucasian ? ?Loss Factors: ?NA ? ?Historical Factors: ?NA ? ?Risk Reduction Factors:   ?Living with another person, especially a relative and Positive social support ? ?Continued Clinical Symptoms:  ?Schizophrenia:   Paranoid or undifferentiated type ? ?Cognitive Features That Contribute To Risk:  ?None    ? ?Suicide Risk:  ?Mild:There are no identifiable suicide plans, no associated intent, mild dysphoria and related symptoms, good self-control (both objective and subjective assessment), few other risk factors, and identifiable protective factors, including available and accessible social support. ? ? Follow-up Information   ? ? Services, [facility]. Go on 07/27/2021.   ?Why: You have a hospital follow up appointment for therapy services on 07/27/21 at 12:00 pm.  This appointment will be held in person. ?Contact information: ?[address] ?[place] [address] ?[phone] ? ? ?  ?  ? ? [facility] Follow up on 07/28/2021.   ?Why: You have an appointment for medication management services on 07/28/21 at 9:10 am.  This appointment will be Virtual. ?Contact information: ?[address] ?[address] ?[place] [address] ?[phone] ? ? ?  ?  ? ? [facility]. Call.   ?Why: Please contact this agency to schedule an appointment at your earliest convenience. ?Contact information: ?Address: [address] [place], [place] [address] ? ?Phone: [phone] ? ?  ?  ? ? [facility]. Call.   ?Why: Please contact your PCP as soon as possible to schedule an appointment ?Contact information: ?Address: [address], [place], [place] [address] ? ?Phone: [phone] ?Fax: [phone] ? ?  ?  ? ?  ?  ? ?  ? ? ?Plan Of Care/Follow-up recommendations:  ?Activity: as tolerated ? ?Diet: heart healthy ? ?Other: ?-Follow-up with your outpatient psychiatric provider -instructions on appointment date, time, and address (location) are provided to you in discharge paperwork. Next dose of Kirt Boys is due approx 4/25. ? ?-Take your psychiatric medications as prescribed at discharge - instructions are provided to you in the discharge paperwork ? ?-Follow-up with outpatient primary care  doctor and other specialists -for management of chronic medical disease, including: HTN- started on torsemide '10mg'$   daily ? ?-Testing: Follow-up with outpatient provider for abnormal lab results: Elevated TSH, with normal T4 and free T3 ? ?-Recommend abstinence from alcohol, tobacco, and other illicit drug use at discharge.

## 2021-07-26 ENCOUNTER — Ambulatory Visit (INDEPENDENT_AMBULATORY_CARE_PROVIDER_SITE_OTHER): Payer: Medicaid Other | Admitting: Gastroenterology

## 2021-07-27 ENCOUNTER — Ambulatory Visit: Payer: Medicaid Other | Admitting: Urology

## 2021-07-29 ENCOUNTER — Other Ambulatory Visit: Payer: Medicaid Other | Admitting: Obstetrics & Gynecology

## 2021-08-04 ENCOUNTER — Ambulatory Visit: Payer: Medicaid Other | Admitting: Urology

## 2021-08-06 ENCOUNTER — Ambulatory Visit (HOSPITAL_COMMUNITY)
Admission: RE | Admit: 2021-08-06 | Discharge: 2021-08-06 | Disposition: A | Discharge status: Home / Self Care | Payer: Medicaid Other | Source: Ambulatory Visit | Attending: Nurse Practitioner | Admitting: Nurse Practitioner

## 2021-08-06 ENCOUNTER — Other Ambulatory Visit (HOSPITAL_COMMUNITY): Payer: Self-pay | Admitting: Nurse Practitioner

## 2021-08-06 DIAGNOSIS — M79672 Pain in left foot: Secondary | ICD-10-CM

## 2021-08-06 DIAGNOSIS — F17210 Nicotine dependence, cigarettes, uncomplicated: Secondary | ICD-10-CM | POA: Insufficient documentation

## 2021-08-06 DIAGNOSIS — R059 Cough, unspecified: Secondary | ICD-10-CM

## 2021-08-12 ENCOUNTER — Ambulatory Visit (INDEPENDENT_AMBULATORY_CARE_PROVIDER_SITE_OTHER): Payer: Medicaid Other | Admitting: Gastroenterology

## 2021-08-25 ENCOUNTER — Other Ambulatory Visit (HOSPITAL_COMMUNITY): Payer: Self-pay | Admitting: Nurse Practitioner

## 2021-08-25 ENCOUNTER — Other Ambulatory Visit: Payer: Self-pay | Admitting: Nurse Practitioner

## 2021-08-25 DIAGNOSIS — R222 Localized swelling, mass and lump, trunk: Secondary | ICD-10-CM

## 2021-10-11 ENCOUNTER — Ambulatory Visit (HOSPITAL_COMMUNITY)
Admission: RE | Admit: 2021-10-11 | Discharge: 2021-10-11 | Disposition: A | Discharge status: Home / Self Care | Payer: Medicaid Other | Attending: Psychiatry | Admitting: Psychiatry

## 2021-10-11 DIAGNOSIS — J45909 Unspecified asthma, uncomplicated: Secondary | ICD-10-CM | POA: Diagnosis not present

## 2021-10-11 DIAGNOSIS — F332 Major depressive disorder, recurrent severe without psychotic features: Secondary | ICD-10-CM | POA: Insufficient documentation

## 2021-10-11 DIAGNOSIS — F17210 Nicotine dependence, cigarettes, uncomplicated: Secondary | ICD-10-CM | POA: Diagnosis not present

## 2021-10-11 DIAGNOSIS — R45851 Suicidal ideations: Secondary | ICD-10-CM | POA: Diagnosis not present

## 2021-10-11 DIAGNOSIS — Z20822 Contact with and (suspected) exposure to covid-19: Secondary | ICD-10-CM | POA: Insufficient documentation

## 2021-10-11 DIAGNOSIS — E039 Hypothyroidism, unspecified: Secondary | ICD-10-CM | POA: Insufficient documentation

## 2021-10-11 DIAGNOSIS — R21 Rash and other nonspecific skin eruption: Secondary | ICD-10-CM | POA: Diagnosis not present

## 2021-10-11 DIAGNOSIS — Z5901 Sheltered homelessness: Secondary | ICD-10-CM | POA: Diagnosis not present

## 2021-10-11 DIAGNOSIS — F1490 Cocaine use, unspecified, uncomplicated: Secondary | ICD-10-CM | POA: Insufficient documentation

## 2021-10-11 DIAGNOSIS — R7989 Other specified abnormal findings of blood chemistry: Secondary | ICD-10-CM | POA: Insufficient documentation

## 2021-10-11 DIAGNOSIS — J449 Chronic obstructive pulmonary disease, unspecified: Secondary | ICD-10-CM | POA: Diagnosis not present

## 2021-10-11 DIAGNOSIS — F1410 Cocaine abuse, uncomplicated: Secondary | ICD-10-CM | POA: Insufficient documentation

## 2021-10-11 DIAGNOSIS — F1910 Other psychoactive substance abuse, uncomplicated: Secondary | ICD-10-CM | POA: Diagnosis not present

## 2021-10-11 DIAGNOSIS — F339 Major depressive disorder, recurrent, unspecified: Secondary | ICD-10-CM | POA: Diagnosis present

## 2021-10-11 DIAGNOSIS — Z85828 Personal history of other malignant neoplasm of skin: Secondary | ICD-10-CM | POA: Diagnosis not present

## 2021-10-11 DIAGNOSIS — Z87442 Personal history of urinary calculi: Secondary | ICD-10-CM | POA: Diagnosis not present

## 2021-10-11 LAB — SARS CORONAVIRUS 2 BY RT PCR: SARS Coronavirus 2 by RT PCR: NEGATIVE
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
## 2021-10-12 ENCOUNTER — Emergency Department (HOSPITAL_COMMUNITY)
Admission: EM | Admit: 2021-10-12 | Discharge: 2021-10-12 | Disposition: A | Discharge status: Other Acute Inpatient Hospital | Payer: Medicaid Other | Attending: Emergency Medicine | Admitting: Emergency Medicine

## 2021-10-12 ENCOUNTER — Other Ambulatory Visit: Payer: Self-pay

## 2021-10-12 ENCOUNTER — Encounter (HOSPITAL_COMMUNITY): Payer: Self-pay

## 2021-10-12 DIAGNOSIS — F332 Major depressive disorder, recurrent severe without psychotic features: Secondary | ICD-10-CM | POA: Diagnosis present

## 2021-10-12 DIAGNOSIS — R45851 Suicidal ideations: Secondary | ICD-10-CM

## 2021-10-12 DIAGNOSIS — F1410 Cocaine abuse, uncomplicated: Secondary | ICD-10-CM | POA: Diagnosis present

## 2021-10-12 LAB — CBC WITH DIFFERENTIAL/PLATELET
Abs Immature Granulocytes: 0.02 10*3/uL (ref 0.00–0.07)
Basophils Absolute: 0 10*3/uL (ref 0.0–0.1)
Basophils Relative: 0 %
Eosinophils Absolute: 0.2 10*3/uL (ref 0.0–0.5)
Eosinophils Relative: 3 %
HCT: 50.3 % — ABNORMAL HIGH (ref 36.0–46.0)
Hemoglobin: 16.3 g/dL — ABNORMAL HIGH (ref 12.0–15.0)
Immature Granulocytes: 0 %
Lymphocytes Relative: 42 %
Lymphs Abs: 3.4 10*3/uL (ref 0.7–4.0)
MCH: 30.2 pg (ref 26.0–34.0)
MCHC: 32.4 g/dL (ref 30.0–36.0)
MCV: 93.1 fL (ref 80.0–100.0)
Monocytes Absolute: 0.8 10*3/uL (ref 0.1–1.0)
Monocytes Relative: 9 %
Neutro Abs: 3.8 10*3/uL (ref 1.7–7.7)
Neutrophils Relative %: 46 %
Platelets: 256 10*3/uL (ref 150–400)
RBC: 5.4 MIL/uL — ABNORMAL HIGH (ref 3.87–5.11)
RDW: 14.4 % (ref 11.5–15.5)
WBC: 8.2 10*3/uL (ref 4.0–10.5)
nRBC: 0 % (ref 0.0–0.2)

## 2021-10-12 LAB — COMPREHENSIVE METABOLIC PANEL
ALT: 34 U/L (ref 0–44)
AST: 22 U/L (ref 15–41)
Albumin: 3.7 g/dL (ref 3.5–5.0)
Alkaline Phosphatase: 101 U/L (ref 38–126)
Anion gap: 8 (ref 5–15)
BUN: 30 mg/dL — ABNORMAL HIGH (ref 6–20)
CO2: 24 mmol/L (ref 22–32)
Calcium: 9.6 mg/dL (ref 8.9–10.3)
Chloride: 112 mmol/L — ABNORMAL HIGH (ref 98–111)
Creatinine, Ser: 0.75 mg/dL (ref 0.44–1.00)
GFR, Estimated: >60 mL/min (ref >60)
Glucose, Bld: 109 mg/dL — ABNORMAL HIGH (ref 70–99)
Potassium: 3.9 mmol/L (ref 3.5–5.1)
Sodium: 144 mmol/L (ref 135–145)
Total Bilirubin: 0.4 mg/dL (ref 0.3–1.2)
Total Protein: 6.8 g/dL (ref 6.5–8.1)

## 2021-10-12 LAB — RESP PANEL BY RT-PCR (FLU A&B, COVID) ARPGX2
Influenza A by PCR: NEGATIVE
Influenza B by PCR: NEGATIVE
SARS Coronavirus 2 by RT PCR: NEGATIVE

## 2021-10-12 LAB — ETHANOL: Alcohol, Ethyl (B): <10 mg/dL

## 2021-10-12 LAB — RAPID URINE DRUG SCREEN, HOSP PERFORMED
Amphetamines: NOT DETECTED
Barbiturates: NOT DETECTED
Benzodiazepines: NOT DETECTED
Cocaine: POSITIVE — AB
Opiates: NOT DETECTED
Tetrahydrocannabinol: NOT DETECTED

## 2021-10-12 LAB — I-STAT BETA HCG BLOOD, ED (MC, WL, AP ONLY): I-stat hCG, quantitative: <5 m[IU]/mL

## 2021-10-12 MED ORDER — DULOXETINE HCL 30 MG PO CPEP
30.0000 mg | ORAL_CAPSULE | Freq: Every day | ORAL | Status: DC
  Administered 2021-10-12: 30 mg via ORAL
  Filled 2021-10-12: qty 1

## 2021-10-12 MED ORDER — ARIPIPRAZOLE 5 MG PO TABS
5.0000 mg | ORAL_TABLET | Freq: Every day | ORAL | Status: DC
  Administered 2021-10-12: 5 mg via ORAL
  Filled 2021-10-12: qty 1

## 2021-10-12 MED ORDER — TRAZODONE HCL 50 MG PO TABS: 50.0000 mg | ORAL_TABLET | Freq: Every day | ORAL | Status: DC

## 2021-10-12 MED ORDER — HYDROXYZINE HCL 25 MG PO TABS: 25.0000 mg | ORAL_TABLET | Freq: Three times a day (TID) | ORAL | Status: DC | PRN

## 2021-10-12 MED ORDER — DIPHENHYDRAMINE HCL 25 MG PO CAPS
25.0000 mg | ORAL_CAPSULE | Freq: Once | ORAL | Status: AC
  Administered 2021-10-12: 25 mg via ORAL
  Filled 2021-10-12: qty 1

## 2021-10-12 NOTE — BH Assessment (Signed)
[facility] Assessment Progress Note   Per Charmaine Downs, NP, this pt requires psychiatric hospitalization at this time.  At 16:24 Kia calls from [facility].  Pt has been accepted to their facility by Dr Alcide Clever.  Reginold Agent concurs with this disposition, as does the pt who is currently under voluntary status.  EDP Daleen Bo, MD and pt's nurse, Eustaquio Maize, have been notified, and Eustaquio Maize agrees to call report to [phone].  Pt is to be transported via [facility].  Jalene Mullet, [place] Coordinator [phone]

## 2021-10-12 NOTE — ED Provider Notes (Signed)
[facility] DEPT Provider Note   CSN: [id number] Arrival date & time: 10/11/21  2358     History  Chief Complaint  Patient presents with   Psychiatric Evaluation    Kristena TESLA KEELER is a 56 y.o. female.  HPI  Patient with medical history of GERD, HSV, lumbar spondylosis, cocaine use disorder, schizoaffective bipolar type, tobacco dependence, substance use disorder presents today due to depression/suicidal ideations.  [facility], sent to ED because no rooms available at the facility.  Patient states for the last 3 to 4 months she has been feeling really depressed.  Cites recent break-up with her partner she had for 13 months years as the inciting incident.  States she has been smoking crack for the last 6 months which has been helping, she is estranged from her family and feels very alone.  She denies any specific plan but states she almost shoulder self in the face with a gun twice in the last few months due to depression.  She has not taken any of her home medicine for at least 3 to 4 months.   Patient is complaining of itchiness, states she has had a rash on her chest and bilateral forearms.  Has not had any medicine prior to arrival.  No recent antibiotic use, no fevers.  Home Medications Prior to Admission medications   Medication Sig Start Date End Date Taking? Authorizing Provider  albuterol (VENTOLIN HFA) 108 (90 Base) MCG/ACT inhaler Inhale 2 puffs into the lungs every 6 (six) hours as needed for wheezing or shortness of breath. 02/01/20   Lavella Hammock, MD  amLODipine (NORVASC) 5 MG tablet Take 1 tablet (5 mg total) by mouth daily. 07/23/21 08/22/21  Massengill, Ovid Curd, MD  ARIPiprazole (ABILIFY) 5 MG tablet Take 5 tablets (25 mg total) by mouth daily. 07/23/21 08/22/21  Massengill, Ovid Curd, MD  atorvastatin (LIPITOR) 10 MG tablet Take 1 tablet (10 mg total) by mouth daily. 07/22/21 08/21/21  Massengill, Ovid Curd, MD  esomeprazole (NEXIUM) 40  MG capsule TAKE (1) CAPSULE BYMOUTH ONCE DAILY. 05/06/21   Montez Morita, Quillian Quince, MD  fluticasone [facility]) 50 MCG/ACT nasal spray Place 1 spray into both nostrils 2 (two) times daily. 02/01/20   Lavella Hammock, MD  gabapentin (NEURONTIN) 100 MG capsule Take 2 capsules (200 mg total) by mouth 3 (three) times daily. 07/22/21 08/21/21  Massengill, Ovid Curd, MD  paliperidone (INVEGA SUSTENNA) 156 MG/ML SUSY injection Inject 1 mL (156 mg total) into the muscle once for 1 dose. Administer on 07-27-2021. 07/27/21 07/27/21  Janine Limbo, MD  SPIRIVA HANDIHALER 18 MCG inhalation capsule INHALE 1 CAPSULE BY MOUTHTDAILY.M 03/07/19   [provider]  torsemide (DEMADEX) 10 MG tablet Take 1 tablet (10 mg total) by mouth daily. 07/22/21 08/21/21  Massengill, Ovid Curd, MD  traZODone (DESYREL) 50 MG tablet Take 1 tablet (50 mg total) by mouth at bedtime. 07/22/21 08/21/21  Massengill, Ovid Curd, MD  Triamcinolone Acetonide (TRIAMCINOLONE 0.1 % CREAM : EUCERIN) CREA Apply 1 application. topically 3 (three) times daily. 07/22/21   Janine Limbo, MD      Allergies    Patient has no known allergies.    Review of Systems   Review of Systems  Physical Exam Updated Vital Signs BP 130/76   Pulse 79   Temp 97.9 F (36.6 C) (Oral)   Resp 17   SpO2 100%  Physical Exam Vitals and nursing note reviewed. Exam conducted with a chaperone present.  Constitutional:  Appearance: Normal appearance.  HENT:     Head: Normocephalic and atraumatic.  Eyes:     General: No scleral icterus.       Right eye: No discharge.        Left eye: No discharge.     Extraocular Movements: Extraocular movements intact.     Pupils: Pupils are equal, round, and reactive to light.  Cardiovascular:     Rate and Rhythm: Normal rate and regular rhythm.     Pulses: Normal pulses.     Heart sounds: Normal heart sounds. No murmur heard.    No friction rub. No gallop.  Pulmonary:     Effort: Pulmonary effort is normal. No  respiratory distress.     Breath sounds: Normal breath sounds.  Abdominal:     General: Abdomen is flat. Bowel sounds are normal. There is no distension.     Palpations: Abdomen is soft.     Tenderness: There is no abdominal tenderness.  Skin:    General: Skin is warm and dry.     Coloration: Skin is not jaundiced.     Findings: Rash present.     Comments: Rash to forearms anteriorly and chest wall.  Neurological:     Mental Status: She is alert. Mental status is at baseline.     Coordination: Coordination normal.  Psychiatric:     Comments: Tearful affect, hopeful speech.  Goal oriented.     ED Results / Procedures / Treatments   Labs (all labs ordered are listed, but only abnormal results are displayed) Labs Reviewed  COMPREHENSIVE METABOLIC PANEL - Abnormal; Notable for the following components:      Result Value   Chloride 112 (*)    Glucose, Bld 109 (*)    BUN 30 (*)    All other components within normal limits  RAPID URINE DRUG SCREEN, HOSP PERFORMED - Abnormal; Notable for the following components:   Cocaine POSITIVE (*)    All other components within normal limits  CBC WITH DIFFERENTIAL/PLATELET - Abnormal; Notable for the following components:   RBC 5.40 (*)    Hemoglobin 16.3 (*)    HCT 50.3 (*)    All other components within normal limits  RESP PANEL BY RT-PCR (FLU A&B, COVID) ARPGX2  ETHANOL  I-STAT BETA HCG BLOOD, ED ([facility], [facility], [facility] ONLY)    EKG None  Radiology No results found.  Procedures Procedures    Medications Ordered in ED Medications  diphenhydrAMINE (BENADRYL) capsule 25 mg (25 mg Oral Given 10/12/21 0158)    ED Course/ Medical Decision Making/ A&P Clinical Course as of 10/12/21 0251  Tue Oct 12, 2021  0250 Comprehensive metabolic panel(!) BUN is slightly elevated, there is no gross electrolyte derangement or AKI [HS]  0251 CBC with Diff(!) No leukocytosis or anemia [HS]  0251 Urine rapid drug screen (hosp performed)(!) Cocaine  positive as expected based on HPI [HS]  0251 Ethanol Ethanol negative [HS]    Clinical Course User Index [HS] Sherrill Raring, PA-C                           Medical Decision Making Amount and/or Complexity of Data Reviewed Labs: ordered. Decision-making details documented in ED Course.   Patient presents due to depression/suicidal ideations.  She does not have a specific plan but does report consideration of shooting herself twice in the last few months.  She is contracted for safety, I do not think IVC indicated.  Her vitals are stable, she has a slight rash but no other abnormalities on her physical exam.   Patient work-up reassuring, please see ED course for interpretation of laboratory work-up.  Patient stabilized and at this time is appropriate for TTS evaluation.        Final Clinical Impression(s) / ED Diagnoses Final diagnoses:  None    Rx / DC Orders ED Discharge Orders     None         Sherrill Raring, Hershal Coria 10/12/21 0251    Quintella Reichert, MD 10/12/21 [phone]

## 2021-10-12 NOTE — ED Notes (Signed)
MD Eulis Foster notified at [phone] that the rest of the emtala needs to be filled out. Previous nurse already called [facility]. [facility] is waiting outside for patient. MD made aware that [facility] is here.   Vitals have been done. Patient is A&O x4 / ambulatory. Walked to the exit on her own.
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
## 2021-10-12 NOTE — BH Assessment (Signed)
[facility] Assessment Progress Note   Per Charmaine Downs, NP, this pt requires psychiatric hospitalization at this time.  The following facilities have been contacted to seek placement for this pt, with results as noted:  Beds available, information sent, decision pending: [facility]  At capacity: [facility]   If this voluntary pt is accepted to a facility, please discuss disposition with pt to be sure that she agrees to the plan.  If a facility agrees to accept pt and the plan changes in any way please call the facility to inform them of the change.  Final disposition is pending as of this writing.  Jalene Mullet, [place] Coordinator [phone]

## 2021-10-12 NOTE — ED Notes (Signed)
Called [facility] to let them know patient is on the way.

## 2021-10-12 NOTE — ED Notes (Signed)
[facility] transport was called by previous nurse.

## 2021-10-12 NOTE — ED Triage Notes (Signed)
Pt came over from [facility] via safe transport. Pt states that she has been off her anxiety and depression medications for a while and has been doing crack for 6 months. Pt reports being estranged from her family and reports wanting help.

## 2021-11-22 ENCOUNTER — Emergency Department (HOSPITAL_COMMUNITY): Payer: Medicaid Other

## 2021-11-22 ENCOUNTER — Emergency Department (HOSPITAL_COMMUNITY)
Admission: EM | Admit: 2021-11-22 | Discharge: 2021-11-22 | Disposition: A | Discharge status: Home / Self Care | Payer: Medicaid Other | Attending: Emergency Medicine | Admitting: Emergency Medicine

## 2021-11-22 ENCOUNTER — Encounter (HOSPITAL_COMMUNITY): Payer: Self-pay

## 2021-11-22 DIAGNOSIS — J45909 Unspecified asthma, uncomplicated: Secondary | ICD-10-CM | POA: Insufficient documentation

## 2021-11-22 DIAGNOSIS — J449 Chronic obstructive pulmonary disease, unspecified: Secondary | ICD-10-CM | POA: Diagnosis not present

## 2021-11-22 DIAGNOSIS — M7989 Other specified soft tissue disorders: Secondary | ICD-10-CM | POA: Diagnosis not present

## 2021-11-22 DIAGNOSIS — R609 Edema, unspecified: Secondary | ICD-10-CM

## 2021-11-22 DIAGNOSIS — Z7951 Long term (current) use of inhaled steroids: Secondary | ICD-10-CM | POA: Insufficient documentation

## 2021-11-22 DIAGNOSIS — R600 Localized edema: Secondary | ICD-10-CM | POA: Diagnosis not present

## 2021-11-22 DIAGNOSIS — R0602 Shortness of breath: Secondary | ICD-10-CM | POA: Insufficient documentation

## 2021-11-22 LAB — CBC
HCT: 43 % (ref 36.0–46.0)
Hemoglobin: 13.7 g/dL (ref 12.0–15.0)
MCH: 30.2 pg (ref 26.0–34.0)
MCHC: 31.9 g/dL (ref 30.0–36.0)
MCV: 94.7 fL (ref 80.0–100.0)
Platelets: 219 10*3/uL (ref 150–400)
RBC: 4.54 MIL/uL (ref 3.87–5.11)
RDW: 16.7 % — ABNORMAL HIGH (ref 11.5–15.5)
WBC: 4.6 10*3/uL (ref 4.0–10.5)
nRBC: 0 % (ref 0.0–0.2)

## 2021-11-22 LAB — BRAIN NATRIURETIC PEPTIDE: B Natriuretic Peptide: 198 pg/mL — ABNORMAL HIGH (ref 0.0–100.0)

## 2021-11-22 LAB — BASIC METABOLIC PANEL
Anion gap: 7 (ref 5–15)
BUN: 18 mg/dL (ref 6–20)
CO2: 26 mmol/L (ref 22–32)
Calcium: 8.4 mg/dL — ABNORMAL LOW (ref 8.9–10.3)
Chloride: 107 mmol/L (ref 98–111)
Creatinine, Ser: 0.61 mg/dL (ref 0.44–1.00)
GFR, Estimated: >60 mL/min (ref >60)
Glucose, Bld: 107 mg/dL — ABNORMAL HIGH (ref 70–99)
Potassium: 4.4 mmol/L (ref 3.5–5.1)
Sodium: 140 mmol/L (ref 135–145)

## 2021-11-22 MED ORDER — FUROSEMIDE 40 MG PO TABS
20.0000 mg | ORAL_TABLET | Freq: Once | ORAL | Status: AC
  Administered 2021-11-22: 20 mg via ORAL
  Filled 2021-11-22: qty 1

## 2021-11-22 MED ORDER — FUROSEMIDE 20 MG PO TABS: 20.0000 mg | ORAL_TABLET | Freq: Every day | ORAL | 0 refills | Status: DC

## 2021-11-22 NOTE — ED Provider Triage Note (Signed)
Emergency Medicine Provider Triage Evaluation Note  BECKETT HICKMON , a 56 y.o. female  was evaluated in triage.  Pt complains of bilateral lower leg edema and shortness of breath.  Patient reports swelling to her lower extremities for 1.5 weeks.  States that over the last few days she is also developed shortness of breath.  Patient denies taking any Lasix or other diuretics.  Review of Systems  Positive: Leg swelling, shortness of breath Negative: Fever, chills, cough, hemoptysis, chest pain  Physical Exam  BP 131/66 (BP Location: Right Arm)   Pulse 97   Temp 98.1 F (36.7 C) (Oral)   Resp 18   Ht '5\' 4"'$  (1.626 m)   Wt 88.8 kg   SpO2 100%   BMI 33.61 kg/m  Gen:   Awake, no distress   Resp:  Normal effort, to auscultation bilaterally MSK:   Moves extremities without difficulty; edema to bilateral lower extremities Other:    Medical Decision Making  Medically screening exam initiated at 4:40 PM.  Appropriate orders placed.  Kayani M Hendley was informed that the remainder of the evaluation will be completed by another provider, this initial triage assessment does not replace that evaluation, and the importance of remaining in the ED until their evaluation is complete.  Concern for possible CHF.   Loni Beckwith, [place] 11/22/21 1641

## 2021-11-22 NOTE — ED Provider Notes (Signed)
[facility] EMERGENCY DEPARTMENT Provider Note   CSN: [id number] Arrival date & time: 11/22/21  1445     History  Chief Complaint  Patient presents with   Leg Swelling    Meredith [name] is a 56 y.o. female with a history of asthma, COPD, anxiety, depression, bipolar 1 disorder, schizoaffective disorder, GERD, fibromyalgia.  Presents emergency department with complaint of shortness of breath and bilateral lower leg swelling.  Patient reports that she has had swelling to bilateral lower extremities over the last week and a half.  Swelling has gotten progressively worse.  Patient has tried wearing compression stockings for 1 day without any change.  Patient has noted some decrease in swelling when she elevated her legs.  Patient states that she started having shortness of breath over the last 3 to 4 days.  Shortness of breath has been constant over this time.  Denies any aggravation of shortness of breath.  Patient denies being on any diuretics, fevers, chills, cough, hemoptysis, chest pain, lightheadedness, syncope.  HPI     Home Medications Prior to Admission medications   Medication Sig Start Date End Date Taking? Authorizing Provider  albuterol (VENTOLIN HFA) 108 (90 Base) MCG/ACT inhaler Inhale 2 puffs into the lungs every 6 (six) hours as needed for wheezing or shortness of breath. Patient taking differently: Inhale 2 puffs into the lungs every 4 (four) hours as needed for wheezing or shortness of breath. 02/01/20   Lavella Hammock, MD  amLODipine (NORVASC) 5 MG tablet Take 1 tablet (5 mg total) by mouth daily. Patient not taking: Reported on 10/12/2021 07/23/21 08/22/21  Janine Limbo, MD  ARIPiprazole (ABILIFY) 10 MG tablet Take 10 mg by mouth daily. Patient not taking: Reported on 10/12/2021    [provider]  ARIPiprazole (ABILIFY) 20 MG tablet Take 20 mg by mouth daily. Patient not taking: Reported on 10/12/2021    [provider]  ARIPiprazole  (ABILIFY) 5 MG tablet Take 5 tablets (25 mg total) by mouth daily. Patient not taking: Reported on 10/12/2021 07/23/21 08/22/21  Janine Limbo, MD  atorvastatin (LIPITOR) 10 MG tablet Take 1 tablet (10 mg total) by mouth daily. Patient not taking: Reported on 10/12/2021 07/22/21 08/21/21  Janine Limbo, MD  citalopram (CELEXA) 20 MG tablet Take 20 mg by mouth daily. Patient not taking: Reported on 10/12/2021 07/21/21   [provider]  cyclobenzaprine (FLEXERIL) 10 MG tablet SMARTSIG:1 Tablet(s) By Mouth Every 12 Hours Patient not taking: Reported on 10/12/2021 07/22/21   [provider]  diclofenac (VOLTAREN) 75 MG EC tablet SMARTSIG:1 Tablet(s) By Mouth Patient not taking: Reported on 10/12/2021 07/22/21   [provider]  Diclofenac Sodium 3 % GEL SMARTSIG:2-3 Gram(s) Topical 4 Times Daily Patient not taking: Reported on 10/12/2021 09/22/21   [provider]  DULoxetine (CYMBALTA) 20 MG capsule Take 20 mg by mouth daily. Patient not taking: Reported on 10/12/2021 07/22/21   [provider]  esomeprazole (NEXIUM) 40 MG capsule TAKE (1) CAPSULE BYMOUTH ONCE DAILY. Patient not taking: Reported on 10/12/2021 05/06/21   Montez Morita, Quillian Quince, MD  fluticasone [facility]) 50 MCG/ACT nasal spray Place 1 spray into both nostrils 2 (two) times daily. Patient not taking: Reported on 10/12/2021 02/01/20   Lavella Hammock, MD  gabapentin (NEURONTIN) 100 MG capsule Take 2 capsules (200 mg total) by mouth 3 (three) times daily. Patient not taking: Reported on 10/12/2021 07/22/21 08/21/21  Janine Limbo, MD  gabapentin (NEURONTIN) 300 MG capsule Take 300 mg by mouth 3 (  three) times daily. Patient not taking: Reported on 10/12/2021 09/16/21   [provider]  hydrOXYzine (ATARAX) 25 MG tablet Take 25 mg by mouth 2 (two) times daily. Patient not taking: Reported on 10/12/2021 07/28/21   [provider]  hydrOXYzine (ATARAX) 50 MG tablet Take 50 mg by  mouth 3 (three) times daily. Patient not taking: Reported on 10/12/2021 08/03/21   [provider]  lidocaine (XYLOCAINE) 5 % ointment SMARTSIG:2-3 Gram(s) Topical 4 Times Daily Patient not taking: Reported on 10/12/2021 09/22/21   [provider]  lithium carbonate (LITHOBID) 300 MG CR tablet Take 300 mg by mouth daily. Patient not taking: Reported on 10/12/2021 05/24/21   [provider]  ondansetron (ZOFRAN-ODT) 4 MG disintegrating tablet 4 mg every 8 (eight) hours as needed. Patient not taking: Reported on 10/12/2021 07/22/21   [provider]  paliperidone (INVEGA SUSTENNA) 156 MG/ML SUSY injection Inject 1 mL (156 mg total) into the muscle once for 1 dose. Administer on 07-27-2021. Patient not taking: Reported on 10/12/2021 07/27/21 07/27/21  Janine Limbo, MD  pregabalin (LYRICA) 25 MG capsule Take by mouth every 6 (six) hours. Patient not taking: Reported on 10/12/2021 07/22/21   [provider]  SPIRIVA HANDIHALER 18 MCG inhalation capsule INHALE 1 CAPSULE BY MOUTHTDAILY.M Patient not taking: Reported on 10/12/2021 03/07/19   [provider]  SUBOXONE 8-2 MG FILM SMARTSIG:1 Strip(s) Sublingual Every 12 Hours Patient not taking: Reported on 10/12/2021 09/17/21   [provider]  torsemide (DEMADEX) 10 MG tablet Take 1 tablet (10 mg total) by mouth daily. Patient not taking: Reported on 10/12/2021 07/22/21 08/21/21  Janine Limbo, MD  traZODone (DESYREL) 50 MG tablet Take 1 tablet (50 mg total) by mouth at bedtime. Patient not taking: Reported on 10/12/2021 07/22/21 08/21/21  Janine Limbo, MD  Triamcinolone Acetonide (TRIAMCINOLONE 0.1 % CREAM : EUCERIN) CREA Apply 1 application. topically 3 (three) times daily. Patient not taking: Reported on 10/12/2021 07/22/21   Janine Limbo, MD  valACYclovir (VALTREX) 1000 MG tablet Take 1,000 mg by mouth 2 (two) times daily. Patient not taking: Reported on 10/12/2021 07/22/21   [provider]  Vitamin D, Ergocalciferol, (DRISDOL) 1.25 MG (50000 UNIT) CAPS capsule Take 50,000 Units by mouth once a week. Patient not taking: Reported on 10/12/2021 08/17/21   [provider]      Allergies    Patient has no known allergies.    Review of Systems   Review of Systems  Constitutional:  Negative for chills and fever.  Eyes:  Negative for visual disturbance.  Respiratory:  Positive for shortness of breath. Negative for cough.   Cardiovascular:  Positive for leg swelling. Negative for chest pain.  Gastrointestinal:  Negative for abdominal pain, nausea and vomiting.  Genitourinary:  Negative for difficulty urinating and dysuria.  Musculoskeletal:  Negative for back pain and neck pain.  Skin:  Negative for color change and rash.  Neurological:  Negative for dizziness, syncope, light-headedness and headaches.  Psychiatric/Behavioral:  Negative for confusion.     Physical Exam Updated Vital Signs BP 121/77   Pulse 79   Temp 98.1 F (36.7 C) (Oral)   Resp (!) 21   Ht '5\' 4"'$  (1.626 m)   Wt 88.8 kg   SpO2 99%   BMI 33.61 kg/m  Physical Exam Vitals and nursing note reviewed.  Constitutional:      General: She is not in acute distress.    Appearance: She is not ill-appearing, toxic-appearing or diaphoretic.  HENT:  Head: Normocephalic.  Eyes:     General: No scleral icterus.       Right eye: No discharge.        Left eye: No discharge.  Cardiovascular:     Rate and Rhythm: Normal rate.     Pulses:          Radial pulses are 2+ on the right side and 2+ on the left side.     Heart sounds: Normal heart sounds, S1 normal and S2 normal. Heart sounds not distant. No murmur heard. Pulmonary:     Effort: Pulmonary effort is normal. No tachypnea or bradypnea.     Breath sounds: Normal breath sounds. No stridor.  Abdominal:     General: Abdomen is flat. There is no distension. There are no signs of injury.     Palpations: Abdomen is soft. There is no mass  or pulsatile mass.     Tenderness: There is no abdominal tenderness.  Musculoskeletal:     Right lower leg: 3+ Edema present.     Left lower leg: 3+ Edema present.     Comments: +2 pitting edema to bilateral lower extremities from knee to foot.  Minimal tenderness to bilateral lower extremities.  No erythema to bilateral lower extremities.  Skin:    General: Skin is warm and dry.  Neurological:     General: No focal deficit present.     Mental Status: She is alert.  Psychiatric:        Behavior: Behavior is cooperative.     ED Results / Procedures / Treatments   Labs (all labs ordered are listed, but only abnormal results are displayed) Labs Reviewed  BASIC METABOLIC PANEL - Abnormal; Notable for the following components:      Result Value   Glucose, Bld 107 (*)    Calcium 8.4 (*)    All other components within normal limits  CBC - Abnormal; Notable for the following components:   RDW 16.7 (*)    All other components within normal limits  BRAIN NATRIURETIC PEPTIDE - Abnormal; Notable for the following components:   B Natriuretic Peptide 198.0 (*)    All other components within normal limits  POC URINE PREG, ED    EKG None  Radiology DG Chest 2 View  Result Date: 11/22/2021 CLINICAL DATA:  Shortness of breath EXAM: CHEST - 2 VIEW COMPARISON:  08/06/2021 FINDINGS: The heart size and mediastinal contours are within normal limits. Mildly increased interstitial markings bilaterally, most pronounced within the perihilar and bibasilar regions. No focal consolidation. No pleural effusion or pneumothorax. The visualized skeletal structures are unremarkable. IMPRESSION: Mildly increased interstitial markings bilaterally, most pronounced within the perihilar and bibasilar regions, which may reflect bronchitic type lung changes versus mild edema. Electronically Signed   By: Davina Poke D.O.   On: 11/22/2021 16:01    Procedures Procedures    Medications Ordered in  ED Medications - No data to display  ED Course/ Medical Decision Making/ A&P                           Medical Decision Making Amount and/or Complexity of Data Reviewed Labs: ordered. Radiology: ordered.  Risk Prescription drug management.   Alert 56 year old female in no acute distress, nontoxic-appearing.  Presents to the ED with a complaint of bilateral lower leg swelling and shortness of breath.  Information was obtained from patient and patient's fianc at bedside.  I reviewed patient's past medical records  including previous phone notes, labs, and imaging.  Patient has medical history as outlined in HPI which complicates her care.  Due to reports of bilateral lower leg swelling and shortness of breath there is concern for acute CHF exacerbation although patient does not have a formal diagnosis.  BNP, BMP, CBC, and chest x-ray were obtained.  I personally viewed and interpreted patient's EKG.  Tracing shows sinus rhythm.  I personally viewed and interpret patient's chest x-ray.  Agree with radiology interpretation of mildly increased interstitial markings bilaterally, most pronounced within the perihilar and bibasilar regions which may reflect bronchitic type lung changes.  Suspect this is more likely than edema as patient has no signs of COPD.  I personally viewed and interpreted patient's lab results.  Pertinent findings include: -BNP 198 -BMP and CBC unremarkable  DVT was considered however less likely due to patient having equal swelling to bilateral lower extremities.  Cellulitis was considered however patient is afebrile with no reports of fever, chills and there is no erythema or warmth to bilateral lower extremities.  Suspect that patient's symptoms are due to peripheral edema.  We will give patient a short course of Lasix.  Discussed wearing compression stockings, elevating legs, and decrease salt intake.  Patient to follow-up with PCP for further management.  Patient  care discussed with attending physician Dr. Doren Custard.  Based on patient's chief complaint, I considered admission might be necessary, however after reassuring ED workup feel patient is reasonable for discharge.  Discussed results, findings, treatment and follow up. Patient advised of return precautions. Patient verbalized understanding and agreed with plan.  Portions of this note were generated with [employer]. Dictation errors may occur despite best attempts at proofreading.         Final Clinical Impression(s) / ED Diagnoses Final diagnoses:  None    Rx / DC Orders ED Discharge Orders     None         Dyann Ruddle 11/22/21 1820    Godfrey Pick, MD 11/24/21 [phone]

## 2021-11-22 NOTE — ED Triage Notes (Signed)
Pt states she has fluid up bilateral legs, up to pelvic area, first noticed about 1.5 weeks ago. Pt endorses SHOB.

## 2021-11-22 NOTE — Discharge Instructions (Addendum)
You came to the emergency department today to be evaluated for your leg swelling.  Your swelling is likely due to peripheral edema.  I have given you prescription for Lasix to take over the next 5 days.  Please follow-up with your primary care doctor for further management.  Additionally please decrease your salt intake as we discussed, elevate your legs as we discussed, and wear compression stockings as we discussed.  Get help right away if: You have edema that starts suddenly or is getting worse, especially if you are pregnant or have a medical condition. You develop shortness of breath, especially when you are lying down. You have pain in your chest or abdomen. You feel weak. You feel like you will faint.

## 2021-11-24 ENCOUNTER — Telehealth: Payer: Self-pay | Admitting: Cardiology

## 2021-11-24 NOTE — Telephone Encounter (Signed)
Pt c/o swelling: STAT is pt has developed SOB within 24 hours  If swelling, where is the swelling located?   Legs and feet  How much weight have you gained and in what time span?  About 24lbs since 11/12/21  Have you gained 3 pounds in a day or 5 pounds in a week? Yes  Do you have a log of your daily weights (if so, list)?  No  Are you currently taking a fluid pill? Yes  Are you currently SOB?     Have you traveled recently?   Patient states she also has dizziness.  Patient stated she was give Lasik but it did not help her.  Patient states it is hurting her to walk

## 2021-11-24 NOTE — Telephone Encounter (Signed)
Reports bilateral swelling in legs and feet that started 2 weeks ago. Reports SOB at night time for the past week with some dizziness. Denies chest pain. Says she went to the ED for these problems already and says she was told she may have heart problems. Gave appointment to see Domenic Polite in the morning at 8:20 am. Advised that if symptoms get worse, to go to the ED for an evaluation. Verbalized understanding.
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
## 2021-11-25 ENCOUNTER — Telehealth: Payer: Self-pay | Admitting: Cardiology

## 2021-11-25 ENCOUNTER — Ambulatory Visit (INDEPENDENT_AMBULATORY_CARE_PROVIDER_SITE_OTHER): Payer: Medicaid Other

## 2021-11-25 ENCOUNTER — Encounter: Payer: Self-pay | Admitting: Cardiology

## 2021-11-25 ENCOUNTER — Ambulatory Visit (INDEPENDENT_AMBULATORY_CARE_PROVIDER_SITE_OTHER): Payer: Medicaid Other | Admitting: Cardiology

## 2021-11-25 VITALS — BP 120/78 | HR 78 | Ht 63.0 in | Wt 193.6 lb

## 2021-11-25 DIAGNOSIS — R600 Localized edema: Secondary | ICD-10-CM

## 2021-11-25 DIAGNOSIS — I509 Heart failure, unspecified: Secondary | ICD-10-CM

## 2021-11-25 DIAGNOSIS — F1910 Other psychoactive substance abuse, uncomplicated: Secondary | ICD-10-CM

## 2021-11-25 DIAGNOSIS — E782 Mixed hyperlipidemia: Secondary | ICD-10-CM

## 2021-11-25 LAB — ECHOCARDIOGRAM COMPLETE
AR max vel: 1.85 cm2
AV Peak grad: 15.8 mmHg
Ao pk vel: 1.99 m/s
Area-P 1/2: 4.17 cm2
Calc EF: 74 %
Height: 63 in
MV M vel: 3.46 m/s
MV Peak grad: 47.9 mmHg
S' Lateral: 2.91 cm
Single Plane A2C EF: 71.9 %
Single Plane A4C EF: 74.5 %
Weight: 3097.6 oz

## 2021-11-25 MED ORDER — POTASSIUM CHLORIDE ER 10 MEQ PO TBCR: 10.0000 meq | EXTENDED_RELEASE_TABLET | Freq: Every day | ORAL | 1 refills | Status: DC

## 2021-11-25 MED ORDER — FUROSEMIDE 40 MG PO TABS
40.0000 mg | ORAL_TABLET | Freq: Every day | ORAL | 1 refills | Status: DC
Start: 2021-11-25 — End: 2022-04-01

## 2021-11-25 NOTE — Telephone Encounter (Signed)
PERCERT:   ECHO -scheduled for 11/25/2021 in the [facility].

## 2021-11-25 NOTE — Patient Instructions (Addendum)
Medication Instructions:  Your physician has recommended you make the following change in your medication:  Increase lasix to 40 mg once a day Start potassium 10 Meq once a day Continue all other medications as directed  Labwork: BMET in 2 weeks @ [facility]  Testing/Procedures: Your physician has requested that you have an echocardiogram. Echocardiography is a painless test that uses sound waves to create images of your heart. It provides your doctor with information about the size and shape of your heart and how well your heart's chambers and valves are working. This procedure takes approximately one hour. There are no restrictions for this procedure.   Follow-Up:  Your physician recommends that you schedule a follow-up appointment in: 2 weeks  Any Other Special Instructions Will Be Listed Below (If Applicable).  If you need a refill on your cardiac medications before your next appointment, please call your pharmacy.

## 2021-11-25 NOTE — Addendum Note (Signed)
Addended by: Sung Amabile on: 11/25/2021 09:27 AM   Modules accepted: Orders

## 2021-12-01 ENCOUNTER — Ambulatory Visit: Payer: Medicaid Other | Admitting: Cardiology

## 2021-12-02 ENCOUNTER — Encounter: Payer: Self-pay | Admitting: Physician Assistant

## 2021-12-02 DIAGNOSIS — I5030 Unspecified diastolic (congestive) heart failure: Secondary | ICD-10-CM | POA: Insufficient documentation

## 2021-12-02 HISTORY — DX: Unspecified diastolic (congestive) heart failure: I50.30
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
## 2021-12-03 ENCOUNTER — Ambulatory Visit: Payer: Medicaid Other | Attending: Cardiology | Admitting: Physician Assistant

## 2021-12-03 DIAGNOSIS — I5032 Chronic diastolic (congestive) heart failure: Secondary | ICD-10-CM | POA: Insufficient documentation

## 2022-02-12 ENCOUNTER — Encounter (INDEPENDENT_AMBULATORY_CARE_PROVIDER_SITE_OTHER): Payer: Self-pay | Admitting: Gastroenterology

## 2022-03-31 ENCOUNTER — Emergency Department (HOSPITAL_COMMUNITY)
Admission: EM | Admit: 2022-03-31 | Discharge: 2022-04-01 | Disposition: A | Discharge status: Other Acute Inpatient Hospital | Payer: Medicaid Other | Attending: Student | Admitting: Student

## 2022-03-31 ENCOUNTER — Other Ambulatory Visit: Payer: Self-pay

## 2022-03-31 ENCOUNTER — Encounter (HOSPITAL_COMMUNITY): Payer: Self-pay

## 2022-03-31 ENCOUNTER — Ambulatory Visit (HOSPITAL_COMMUNITY)
Admission: AD | Admit: 2022-03-31 | Discharge: 2022-03-31 | Disposition: A | Discharge status: Home / Self Care | Payer: Medicaid Other | Source: Intra-hospital | Attending: Psychiatry | Admitting: Psychiatry

## 2022-03-31 DIAGNOSIS — Z79899 Other long term (current) drug therapy: Secondary | ICD-10-CM | POA: Insufficient documentation

## 2022-03-31 DIAGNOSIS — F99 Mental disorder, not otherwise specified: Secondary | ICD-10-CM | POA: Insufficient documentation

## 2022-03-31 DIAGNOSIS — F1490 Cocaine use, unspecified, uncomplicated: Secondary | ICD-10-CM | POA: Diagnosis not present

## 2022-03-31 DIAGNOSIS — F411 Generalized anxiety disorder: Secondary | ICD-10-CM | POA: Diagnosis not present

## 2022-03-31 DIAGNOSIS — Z1152 Encounter for screening for COVID-19: Secondary | ICD-10-CM | POA: Insufficient documentation

## 2022-03-31 DIAGNOSIS — Z91148 Patient's other noncompliance with medication regimen for other reason: Secondary | ICD-10-CM | POA: Insufficient documentation

## 2022-03-31 DIAGNOSIS — R45851 Suicidal ideations: Secondary | ICD-10-CM

## 2022-03-31 DIAGNOSIS — F200 Paranoid schizophrenia: Secondary | ICD-10-CM | POA: Diagnosis present

## 2022-03-31 LAB — COMPREHENSIVE METABOLIC PANEL
ALT: 23 U/L (ref 0–44)
AST: 14 U/L — ABNORMAL LOW (ref 15–41)
Albumin: 3.9 g/dL (ref 3.5–5.0)
Alkaline Phosphatase: 114 U/L (ref 38–126)
Anion gap: 9 (ref 5–15)
BUN: 21 mg/dL — ABNORMAL HIGH (ref 6–20)
CO2: 22 mmol/L (ref 22–32)
Calcium: 9.4 mg/dL (ref 8.9–10.3)
Chloride: 111 mmol/L (ref 98–111)
Creatinine, Ser: 0.69 mg/dL (ref 0.44–1.00)
GFR, Estimated: >60 mL/min (ref >60)
Glucose, Bld: 111 mg/dL — ABNORMAL HIGH (ref 70–99)
Potassium: 3.5 mmol/L (ref 3.5–5.1)
Sodium: 142 mmol/L (ref 135–145)
Total Bilirubin: 0.6 mg/dL (ref 0.3–1.2)
Total Protein: 7.1 g/dL (ref 6.5–8.1)

## 2022-03-31 LAB — I-STAT BETA HCG BLOOD, ED (MC, WL, AP ONLY): I-stat hCG, quantitative: <5 m[IU]/mL (ref <5)

## 2022-03-31 LAB — CBC WITH DIFFERENTIAL/PLATELET
Abs Immature Granulocytes: 0.03 10*3/uL (ref 0.00–0.07)
Basophils Absolute: 0 10*3/uL (ref 0.0–0.1)
Basophils Relative: 0 %
Eosinophils Absolute: 0.1 10*3/uL (ref 0.0–0.5)
Eosinophils Relative: 2 %
HCT: 52.2 % — ABNORMAL HIGH (ref 36.0–46.0)
Hemoglobin: 17.7 g/dL — ABNORMAL HIGH (ref 12.0–15.0)
Immature Granulocytes: 0 %
Lymphocytes Relative: 40 %
Lymphs Abs: 3.5 10*3/uL (ref 0.7–4.0)
MCH: 30.6 pg (ref 26.0–34.0)
MCHC: 33.9 g/dL (ref 30.0–36.0)
MCV: 90.2 fL (ref 80.0–100.0)
Monocytes Absolute: 0.7 10*3/uL (ref 0.1–1.0)
Monocytes Relative: 8 %
Neutro Abs: 4.3 10*3/uL (ref 1.7–7.7)
Neutrophils Relative %: 50 %
Platelets: 231 10*3/uL (ref 150–400)
RBC: 5.79 MIL/uL — ABNORMAL HIGH (ref 3.87–5.11)
RDW: 14.6 % (ref 11.5–15.5)
WBC: 8.7 10*3/uL (ref 4.0–10.5)
nRBC: 0 % (ref 0.0–0.2)

## 2022-03-31 LAB — RAPID URINE DRUG SCREEN, HOSP PERFORMED
Amphetamines: NOT DETECTED
Barbiturates: NOT DETECTED
Benzodiazepines: NOT DETECTED
Cocaine: POSITIVE — AB
Opiates: NOT DETECTED
Tetrahydrocannabinol: NOT DETECTED

## 2022-03-31 LAB — ETHANOL: Alcohol, Ethyl (B): <10 mg/dL (ref <10)

## 2022-03-31 LAB — ACETAMINOPHEN LEVEL: Acetaminophen (Tylenol), Serum: <10 ug/mL — ABNORMAL LOW (ref 10–30)

## 2022-03-31 LAB — SALICYLATE LEVEL: Salicylate Lvl: <7 mg/dL — ABNORMAL LOW (ref 7.0–30.0)

## 2022-03-31 NOTE — ED Triage Notes (Signed)
Pt says she [resented to [facility] and was directed to ED for a place to sleep until they could see her in the morning. Pt says she was told her they would come pick her up whenever able.   Went in voluntary to seek medication adjustments as she has been off meds for a while. Says she has had multiple stressors in home.   Has been having suicidal ideation in recent weeks but denies plan to take action. Currently denies any ideations.

## 2022-03-31 NOTE — ED Provider Triage Note (Signed)
Emergency Medicine Provider Triage Evaluation Note  Meredith [name] , a 56 y.o. female  was evaluated in triage.  Pt complains of paranoia thinking that someone is out to get her, substance abuse crack cocaine today, and depression.  Patient was seen and evaluated at behavioral health urgent care prior to arrival and was sent here due to bed availability.  Patient denies any suicidality or homicidal ideations currently.  Review of Systems  Positive:  Negative: See above   Physical Exam  BP (!) 148/89   Pulse 79   Temp 98 F (36.7 C) (Oral)   Resp 18   Ht '5\' 3"'$  (1.6 m)   Wt 72.6 kg   SpO2 98%   BMI 28.34 kg/m  Gen:   Awake, no distress   Resp:  Normal effort  MSK:   Moves extremities without difficulty  Other:    Medical Decision Making  Medically screening exam initiated at 10:02 PM.  Appropriate orders placed.  Meredith [name] was informed that the remainder of the evaluation will be completed by another provider, this initial triage assessment does not replace that evaluation, and the importance of remaining in the ED until their evaluation is complete.     Meredith [name], [place] 03/31/22 2204

## 2022-03-31 NOTE — H&P (Signed)
Behavioral Health Medical Screening Exam  Meredith [name] is a 56 y.o. female.  With a history of suicidal ideation, major depressive disorder, paranoia.  Presented to [facility] accompanied by her fianc.  Per the patient someone is threatening her.  According to the patient's fianc that story is not correct however the has been paranoid, and has not taken her medicine in months.  According to the fianc the patient does have a psychiatrist which they see via telemedicine, but the patient does not take her medicine because she thinks that someone is after her.  Face-to-face observation of patient, patient is alert and oriented, speech is clear.  Mood is anxious thought process is illogical patient keeps stating that someone is after her.  Patient stated she is suicidal but has no plans, denies HI, AVH.  Patient reports she smoked crack last time being today.  According to the patient she smoked the crack because she has pain and it helps with her pain.  Patient denies alcohol use, however patient states she smoked cigarettes daily to.  When asked why she does not take her medicine patient keeps stating that she does not have any medicine however her fianc says that patient does not want to take the medicines.  Writer discussed with patient the need for her to be compliant with her medication regiment because that is the only way she is going to get better.  Recommend transferring patient to [facility] for overnight observation because no bed is available at [facility].  Writer spoke with Dr Wyvonnia Dusky MD who will accept  patient.    Total Time spent with patient: 20 minutes  Psychiatric Specialty Exam:  Presentation  General Appearance:  Casual  Eye Contact: Fair  Speech: Normal Rate  Speech Volume: Normal  Handedness: Right   Mood and Affect  Mood: Anxious  Affect: Congruent   Thought Process  Thought Processes: Irrevelant  Descriptions of Associations:Loose  Orientation:Full (Time, Place  and Person)  Thought Content:Illogical; Paranoid Ideation  History of Schizophrenia/Schizoaffective disorder:Yes  Duration of Psychotic Symptoms:Greater than six months  Hallucinations:Hallucinations: None  Ideas of Reference:None  Suicidal Thoughts:Suicidal Thoughts: Yes, Passive SI Passive Intent and/or Plan: Without Intent; Without Plan  Homicidal Thoughts:Homicidal Thoughts: No   Sensorium  Memory: Immediate Fair  Judgment: Poor  Insight: Fair   Materials engineer: Fair  Attention Span: Fair  Recall: [employer] of Knowledge: Fair  Language: Fair   Psychomotor Activity  Psychomotor Activity: Psychomotor Activity: Normal   Assets  Assets: Desire for Improvement   Sleep  Sleep: Sleep: Fair Number of Hours of Sleep: 6    Physical Exam: Physical Exam HENT:     Head: Normocephalic.     Nose: Nose normal.  Cardiovascular:     Rate and Rhythm: Normal rate.  Pulmonary:     Effort: Pulmonary effort is normal.  Musculoskeletal:        General: Normal range of motion.     Cervical back: Normal range of motion.  Neurological:     General: No focal deficit present.     Mental Status: She is alert.  Psychiatric:        Mood and Affect: Mood normal.        Behavior: Behavior normal.        Thought Content: Thought content normal.        Judgment: Judgment normal.    Review of Systems  Constitutional: Negative.   HENT: Negative.    Eyes: Negative.  Respiratory: Negative.    Cardiovascular: Negative.   Gastrointestinal: Negative.   Genitourinary: Negative.   Musculoskeletal: Negative.   Skin: Negative.   Neurological: Negative.   Endo/Heme/Allergies: Negative.   Psychiatric/Behavioral:  Positive for substance abuse and suicidal ideas. The patient is nervous/anxious.    Blood pressure 126/83, pulse 83, temperature 98.6 F (37 C), SpO2 98 %. There is no height or weight on file to calculate  BMI.  Musculoskeletal: Strength & Muscle Tone: within normal limits Gait & Station: normal Patient leans: N/A  [place] Scale:  Flowsheet Row OP Visit from 03/31/2022 in [facility] ED from 11/22/2021 in [facility] from 10/12/2021 in [facility] DEPT  C-SSRS RISK CATEGORY High Risk No Risk No Risk       Recommendations:  Based on my evaluation the patient appears to have an emergency medical condition for which I recommend the patient be transferred to the emergency department for further evaluation.  Evette Georges, NP 03/31/2022, 8:39 PM

## 2022-04-01 ENCOUNTER — Ambulatory Visit (HOSPITAL_COMMUNITY)
Admission: EM | Admit: 2022-04-01 | Discharge: 2022-04-02 | Disposition: A | Discharge status: Home / Self Care | Payer: Medicaid Other | Attending: Family | Admitting: Family

## 2022-04-01 DIAGNOSIS — E039 Hypothyroidism, unspecified: Secondary | ICD-10-CM | POA: Insufficient documentation

## 2022-04-01 DIAGNOSIS — F250 Schizoaffective disorder, bipolar type: Secondary | ICD-10-CM | POA: Insufficient documentation

## 2022-04-01 DIAGNOSIS — G8929 Other chronic pain: Secondary | ICD-10-CM | POA: Insufficient documentation

## 2022-04-01 DIAGNOSIS — F17210 Nicotine dependence, cigarettes, uncomplicated: Secondary | ICD-10-CM | POA: Insufficient documentation

## 2022-04-01 DIAGNOSIS — Z9151 Personal history of suicidal behavior: Secondary | ICD-10-CM | POA: Insufficient documentation

## 2022-04-01 DIAGNOSIS — Z79624 Long term (current) use of inhibitors of nucleotide synthesis: Secondary | ICD-10-CM | POA: Insufficient documentation

## 2022-04-01 DIAGNOSIS — F200 Paranoid schizophrenia: Secondary | ICD-10-CM

## 2022-04-01 DIAGNOSIS — K219 Gastro-esophageal reflux disease without esophagitis: Secondary | ICD-10-CM | POA: Insufficient documentation

## 2022-04-01 DIAGNOSIS — F1410 Cocaine abuse, uncomplicated: Secondary | ICD-10-CM | POA: Insufficient documentation

## 2022-04-01 DIAGNOSIS — I5032 Chronic diastolic (congestive) heart failure: Secondary | ICD-10-CM | POA: Insufficient documentation

## 2022-04-01 DIAGNOSIS — Z85828 Personal history of other malignant neoplasm of skin: Secondary | ICD-10-CM | POA: Insufficient documentation

## 2022-04-01 DIAGNOSIS — F1990 Other psychoactive substance use, unspecified, uncomplicated: Secondary | ICD-10-CM | POA: Insufficient documentation

## 2022-04-01 DIAGNOSIS — F419 Anxiety disorder, unspecified: Secondary | ICD-10-CM | POA: Insufficient documentation

## 2022-04-01 DIAGNOSIS — Z79899 Other long term (current) drug therapy: Secondary | ICD-10-CM | POA: Insufficient documentation

## 2022-04-01 LAB — RESP PANEL BY RT-PCR (RSV, FLU A&B, COVID)  RVPGX2
Influenza A by PCR: NEGATIVE
Influenza B by PCR: NEGATIVE
Resp Syncytial Virus by PCR: NEGATIVE
SARS Coronavirus 2 by RT PCR: NEGATIVE

## 2022-04-01 MED ORDER — PALIPERIDONE ER 6 MG PO TB24
6.0000 mg | ORAL_TABLET | Freq: Every day | ORAL | Status: DC
  Administered 2022-04-01 – 2022-04-02 (×2): 6 mg via ORAL
  Filled 2022-04-01 (×2): qty 1

## 2022-04-01 MED ORDER — MAGNESIUM HYDROXIDE 400 MG/5ML PO SUSP: 30.0000 mL | Freq: Every day | ORAL | Status: DC | PRN

## 2022-04-01 MED ORDER — ALUM & MAG HYDROXIDE-SIMETH 200-200-20 MG/5ML PO SUSP: 30.0000 mL | ORAL | Status: DC | PRN

## 2022-04-01 MED ORDER — HYDROXYZINE HCL 25 MG PO TABS
25.0000 mg | ORAL_TABLET | Freq: Three times a day (TID) | ORAL | Status: DC | PRN
  Administered 2022-04-01 – 2022-04-02 (×2): 25 mg via ORAL
  Filled 2022-04-01 (×2): qty 1

## 2022-04-01 MED ORDER — ACETAMINOPHEN 325 MG PO TABS: 650.0000 mg | ORAL_TABLET | Freq: Four times a day (QID) | ORAL | Status: DC | PRN

## 2022-04-01 MED ORDER — TRAZODONE HCL 50 MG PO TABS: 50.0000 mg | ORAL_TABLET | Freq: Every evening | ORAL | Status: DC | PRN

## 2022-04-01 NOTE — ED Notes (Signed)
Patient Belongings bag x2 and paperwork given to transport by [employer].  Pt walked out by [employer].
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
## 2022-04-01 NOTE — ED Provider Notes (Signed)
[facility] DEPT Provider Note   CSN: [id number] Arrival date & time: 03/31/22  2116     History  Chief Complaint  Patient presents with   Mental Health Problem    Meredith [name] is a 56 y.o. female.  Patient with history of substance abuse, states that she has been off all of her medications for the past 4 months including aripiprazole, Depakote, duloxetine, gabapentin --presents to the emergency department after being seen at behavioral health urgent care.  She was recommended for overnight observation and reassessment in the morning.  They did not have any beds there, so she was sent to the emergency department.  Patient seen there for paranoia and depression.  She denies active suicidal ideation or thoughts of hurting others.  She currently has no acute medical complaints.       Home Medications Prior to Admission medications   Medication Sig Start Date End Date Taking? Authorizing Provider  albuterol (VENTOLIN HFA) 108 (90 Base) MCG/ACT inhaler Inhale 2 puffs into the lungs every 6 (six) hours as needed for wheezing or shortness of breath. Patient taking differently: Inhale 2 puffs into the lungs every 4 (four) hours as needed for wheezing or shortness of breath. 02/01/20   Lavella Hammock, MD  ARIPiprazole (ABILIFY) 10 MG tablet Take 10 mg by mouth daily.    [provider]  ARIPiprazole (ABILIFY) 20 MG tablet Take 20 mg by mouth daily.    [provider]  atorvastatin (LIPITOR) 10 MG tablet Take 1 tablet (10 mg total) by mouth daily. 07/22/21 11/26/22  Massengill, Ovid Curd, MD  cyclobenzaprine (FLEXERIL) 10 MG tablet  07/22/21   [provider]  diclofenac (VOLTAREN) 75 MG EC tablet Take 75 mg by mouth daily.    [provider]  divalproex (DEPAKOTE) 500 MG DR tablet Take 1,000 mg by mouth at bedtime.    [provider]  DULoxetine (CYMBALTA) 20 MG capsule Take 20 mg by mouth daily. 07/22/21   [provider]  furosemide (LASIX) 40 MG tablet Take 1 tablet (40 mg total) by mouth daily. 11/25/21 05/24/22  Satira Sark, MD  gabapentin (NEURONTIN) 300 MG capsule Take 300 mg by mouth 3 (three) times daily. 09/16/21   [provider]  lidocaine (XYLOCAINE) 5 % ointment  09/22/21   [provider]  MISC NATURAL PRODUCTS PO Take by mouth. Lipozene '1500mg'$  twice a day    [provider]  montelukast (SINGULAIR) 10 MG tablet Take 10 mg by mouth at bedtime.    [provider]  nitrofurantoin (MACRODANTIN) 50 MG capsule Take 50 mg by mouth at bedtime.    [provider]  ondansetron (ZOFRAN-ODT) 4 MG disintegrating tablet 4 mg every 8 (eight) hours as needed. 07/22/21   [provider]  potassium chloride (KLOR-CON) 10 MEQ tablet Take 1 tablet (10 mEq total) by mouth daily. 11/25/21 05/24/22  Satira Sark, MD  pregabalin (LYRICA) 25 MG capsule Take by mouth every 6 (six) hours. 07/22/21   [provider]  SPIRIVA HANDIHALER 18 MCG inhalation capsule  03/07/19   [provider]  SUBOXONE 8-2 MG FILM  09/17/21   [provider]  traZODone (DESYREL) 100 MG tablet Take 100 mg by mouth at bedtime as needed for sleep.    [provider]  valACYclovir (VALTREX) 1000 MG tablet Take 1,000 mg by mouth 2 (two) times daily. 07/22/21   [provider]      Allergies  Patient has no known allergies.    Review of Systems   Review of Systems  Physical Exam Updated Vital Signs BP 129/84 (BP Location: Left Arm)   Pulse 87   Temp 97.8 F (36.6 C) (Oral)   Resp 18   Ht '5\' 3"'$  (1.6 m)   Wt 72.6 kg   SpO2 100%   BMI 28.34 kg/m   Physical Exam Vitals and nursing note reviewed.  Constitutional:      General: She is not in acute distress.    Appearance: She is well-developed.  HENT:     Head: Normocephalic and atraumatic.     Right Ear: External ear normal.     Left Ear: External ear normal.     Nose:  Nose normal.  Eyes:     Conjunctiva/sclera: Conjunctivae normal.  Cardiovascular:     Rate and Rhythm: Normal rate and regular rhythm.     Heart sounds: No murmur heard. Pulmonary:     Effort: No respiratory distress.     Breath sounds: No wheezing, rhonchi or rales.  Abdominal:     Palpations: Abdomen is soft.     Tenderness: There is no abdominal tenderness. There is no guarding or rebound.  Musculoskeletal:     Cervical back: Normal range of motion and neck supple.     Right lower leg: No edema.     Left lower leg: No edema.  Skin:    General: Skin is warm and dry.     Findings: No rash.  Neurological:     General: No focal deficit present.     Mental Status: She is alert. Mental status is at baseline.     Motor: No weakness.  Psychiatric:        Mood and Affect: Mood normal.     ED Results / Procedures / Treatments   Labs (all labs ordered are listed, but only abnormal results are displayed) Labs Reviewed  COMPREHENSIVE METABOLIC PANEL - Abnormal; Notable for the following components:      Result Value   Glucose, Bld 111 (*)    BUN 21 (*)    AST 14 (*)    All other components within normal limits  RAPID URINE DRUG SCREEN, HOSP PERFORMED - Abnormal; Notable for the following components:   Cocaine POSITIVE (*)    All other components within normal limits  CBC WITH DIFFERENTIAL/PLATELET - Abnormal; Notable for the following components:   RBC 5.79 (*)    Hemoglobin 17.7 (*)    HCT 52.2 (*)    All other components within normal limits  SALICYLATE LEVEL - Abnormal; Notable for the following components:   Salicylate Lvl <0.3 (*)    All other components within normal limits  ACETAMINOPHEN LEVEL - Abnormal; Notable for the following components:   Acetaminophen (Tylenol), Serum <10 (*)    All other components within normal limits  ETHANOL  I-STAT BETA HCG BLOOD, ED ([facility], [facility], [facility] ONLY)    EKG None  Radiology No results found.  Procedures Procedures     Medications Ordered in ED Medications - No data to display  ED Course/ Medical Decision Making/ A&P    Patient seen and examined. History obtained directly from patient. Work-up including labs, imaging, EKG ordered in triage, if performed, were reviewed.    Labs/EKG: Independently reviewed and interpreted.  This included: CBC demonstrates elevated white blood cell count with hemoglobin of 17.7 otherwise unremarkable; CMP with normal electrolytes, glucose minimally elevated at 111, normal liver function test;  rapid drug screen positive for cocaine, otherwise negative; ethanol undetectable.  Imaging: None ordered  Medications/Fluids: None ordered  Most recent vital signs reviewed and are as follows: BP 129/84 (BP Location: Left Arm)   Pulse 87   Temp 97.8 F (36.6 C) (Oral)   Resp 18   Ht '5\' 3"'$  (1.6 m)   Wt 72.6 kg   SpO2 100%   BMI 28.34 kg/m   Initial impression: Substance abuse, paranoia/psychosis likely related to this.  Patient is medically cleared.                            Medical Decision Making  Pending completion of psych obs and re-eval.         Final Clinical Impression(s) / ED Diagnoses Final diagnoses:  Cocaine use    Rx / DC Orders ED Discharge Orders     None         Carlisle Cater, PA-C 04/01/22 0559    Teressa Lower, MD 04/01/22 [phone]

## 2022-04-01 NOTE — ED Provider Notes (Signed)
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 Patients: EntrepreneurPulse.com.au  Fact Sheet for Healthcare Providers: IncredibleEmployment.be  This test is not yet approved or cleared by the [place] FDA and has been authorized for detection and/or diagnosis of SARS-CoV-2 by FDA under an Emergency Use Authorization (EUA). This EUA will  remain in effect (meaning this test can be used) for the duration of the COVID-19 declaration under Section 564(b)(1) of the Act, 21 U.S.C. section 360bbb-3(b)(1), unless the authorization is terminated or revoked.  Performed at [facility], [place] [address], [place], [place] [address]    Sodium 10/12/2021 144  135 - 145 mmol/L Final   Potassium 10/12/2021 3.9  3.5 - 5.1 mmol/L Final   Chloride 10/12/2021 112 (H)  98 - 111 mmol/L Final   CO2 10/12/2021 24  22 - 32 mmol/L Final   Glucose, Bld 10/12/2021 109 (H)  70 - 99 mg/dL Final   Glucose reference range applies only to samples taken after fasting for at least 8 hours.   BUN 10/12/2021 30 (H)  6 - 20 mg/dL Final   Creatinine, Ser 10/12/2021 0.75  0.44 - 1.00 mg/dL Final   Calcium 10/12/2021 9.6  8.9 - 10.3 mg/dL Final   Total Protein 10/12/2021 6.8  6.5 - 8.1 g/dL Final   Albumin 10/12/2021 3.7  3.5 - 5.0 g/dL Final   AST 10/12/2021 22  15 - 41 U/L Final   ALT 10/12/2021 34  0 - 44 U/L Final   Alkaline Phosphatase 10/12/2021 101  38 - 126 U/L Final   Total Bilirubin 10/12/2021  0.4  0.3 - 1.2 mg/dL Final   GFR, Estimated 10/12/2021 >60  >60 mL/min Final   Comment: (NOTE) Calculated using the CKD-EPI Creatinine Equation (2021)    Anion gap 10/12/2021 8  5 - 15 Final   Performed at [facility], [place] [address] [place], [place] [address]   Alcohol, Ethyl (B) 10/12/2021 <10  <10 mg/dL Final   Comment: (NOTE) Lowest detectable limit for serum alcohol is 10 mg/dL.  For medical purposes only. Performed at [facility], [place] [address] [place], [place] [address]    Opiates 10/12/2021 NONE DETECTED  NONE DETECTED Final   Cocaine 10/12/2021 POSITIVE (A)  NONE DETECTED Final   Benzodiazepines 10/12/2021 NONE DETECTED  NONE DETECTED Final   Amphetamines 10/12/2021 NONE DETECTED  NONE DETECTED Final   Tetrahydrocannabinol 10/12/2021 NONE DETECTED  NONE DETECTED Final    Barbiturates 10/12/2021 NONE DETECTED  NONE DETECTED Final   Comment: (NOTE) DRUG SCREEN FOR MEDICAL PURPOSES ONLY.  IF CONFIRMATION IS NEEDED FOR ANY PURPOSE, NOTIFY LAB WITHIN 5 DAYS.  LOWEST DETECTABLE LIMITS FOR URINE DRUG SCREEN Drug Class                     Cutoff (ng/mL) Amphetamine and metabolites    1000 Barbiturate and metabolites    200 Benzodiazepine                 413 Tricyclics and metabolites     300 Opiates and metabolites        300 Cocaine and metabolites        300 THC                            50 Performed at [facility], [place] [address] [place], [place] [address]    WBC 10/12/2021 8.2  4.0 - 10.5 K/uL Final   RBC 10/12/2021 5.40 (H)  3.87 - 5.11 MIL/uL Final   Hemoglobin 10/12/2021 16.3 (H)  12.0 - 15.0 g/dL Final   HCT 10/12/2021 50.3 (H)  36.0 - 46.0 % Final   MCV 10/12/2021 93.1  80.0 - 100.0 fL Final   MCH 10/12/2021 30.2  26.0 - 34.0 pg Final   MCHC 10/12/2021 32.4  30.0 - 36.0 g/dL Final   RDW 10/12/2021 14.4  11.5 - 15.5 % Final   Platelets 10/12/2021 256  150 - 400 K/uL Final   nRBC 10/12/2021 0.0  0.0 - 0.2 % Final   Neutrophils Relative % 10/12/2021 46  % Final   Neutro Abs 10/12/2021 3.8  1.7 - 7.7 K/uL Final   Lymphocytes Relative 10/12/2021 42  % Final   Lymphs Abs 10/12/2021 3.4  0.7 - 4.0 K/uL Final   Monocytes Relative 10/12/2021 9  % Final   Monocytes Absolute 10/12/2021 0.8  0.1 - 1.0 K/uL Final   Eosinophils Relative 10/12/2021 3  % Final   Eosinophils Absolute 10/12/2021 0.2  0.0 - 0.5 K/uL Final   Basophils Relative 10/12/2021 0  % Final   Basophils Absolute 10/12/2021 0.0  0.0 - 0.1 K/uL Final   Immature Granulocytes 10/12/2021 0  % Final   Abs Immature Granulocytes 10/12/2021 0.02  0.00 - 0.07 K/uL Final   Performed at [facility], [place] [address] [place], [place] [address]   I-stat hCG, quantitative 10/12/2021 <5.0  <5 mIU/mL Final   Comment 3 10/12/2021  Final   Comment:    GEST. AGE      CONC.  (mIU/mL)   <=1 WEEK        5 - 50     2 WEEKS       50 - 500     3 WEEKS       100 - 10,000     4 WEEKS     1,000 - 30,000        FEMALE AND NON-PREGNANT FEMALE:     LESS THAN 5 mIU/mL   Hospital Outpatient Visit on 10/11/2021  Component Date Value Ref Range Status   SARS Coronavirus 2 by RT PCR 10/11/2021 NEGATIVE  NEGATIVE Final   Comment: (NOTE) SARS-CoV-2 target nucleic acids are NOT DETECTED.  The SARS-CoV-2 RNA is generally detectable in upper and lower respiratory specimens during the acute phase of infection. The lowest concentration of SARS-CoV-2 viral copies this assay can detect is 250 copies / mL. A negative result does not preclude SARS-CoV-2 infection and should not be used as the sole basis for treatment or other patient management decisions.  A negative result may occur with improper specimen collection / handling, submission of specimen other than nasopharyngeal swab, presence of viral mutation(s) within the areas targeted by this assay, and inadequate number of viral copies (<250 copies / mL). A negative result must be combined with clinical observations, patient history, and epidemiological information.  Fact Sheet for Patients:   https://www.patel.info/  Fact Sheet for Healthcare Providers: https://hall.com/  This test is not yet approved or                           cleared by the [place] FDA and has been authorized for detection and/or diagnosis of SARS-CoV-2 by FDA under an Emergency Use Authorization (EUA).  This EUA will remain in effect (meaning this test can be used) for the duration of the COVID-19 declaration under Section 564(b)(1) of the Act, 21 U.S.C. section 360bbb-3(b)(1), unless the authorization is terminated or revoked sooner.  Performed at [facility], [place] [address] [place],  [address]     Allergies: Patient has no known  allergies.  PTA Medications: (Not in a hospital admission)   Medical Decision Making  Patient reviewed with Dr Hampton Abbot. Patient remains voluntary.  She will be admitted to observation unit for treatment and stabilization.  She will be reassessed on 04/02/2022, disposition will be determined at that time.  Labs reviewed, collected 03/31/2022 CMP-AST 14, glucose mildly elevated 111, not fasting  UDS-positive cocaine hCG- less than 5 Acetaminophen level- less than 10 Salicylate level- less than 7 Ethanol- less than 10 Respiratory Panel-influenza A and B negative, COVID negative EKG-QT/QTcB measures- 368/441  Current medications: -Acetaminophen 650 mg every 6 as needed/mild pain -Maalox 30 mL oral every 4 as needed/digestion -Hydroxyzine 25 mg 3 times daily as needed/anxiety -Magnesium hydroxide 30 mL daily as needed/mild constipation -Trazodone 50 mg nightly as needed/sleep  Medications: -Paliperidone 24-hour tablet 6 mg daily/mood     Recommendations  Based on my evaluation the patient does not appear to have an emergency medical condition.  Lucky Rathke, FNP 04/01/22  3:11 PM

## 2022-04-01 NOTE — ED Notes (Signed)
Pt had bedtime snack.

## 2022-04-01 NOTE — ED Provider Notes (Signed)
Emergency Medicine Observation Re-evaluation Note  Meredith [name] is a 56 y.o. female, seen on rounds today.  Pt initially presented to the ED for complaints of Mental Health Problem Currently, the patient is under continued psych observation with plan pending today. No beds available at [facility], was initially seen there.  Today, reports feeling better. Feels "warm" will recheck temperature.  Physical Exam  BP 129/84 (BP Location: Left Arm)   Pulse 87   Temp 97.8 F (36.6 C) (Oral)   Resp 18   Ht '5\' 3"'$  (1.6 m)   Wt 72.6 kg   SpO2 100%   BMI 28.34 kg/m  Physical Exam General: NAD Cardiac: RR Lungs: even, unlabored Psych: normal affect  ED Course / MDM  EKG:   I have reviewed the labs performed to date as well as medications administered while in observation.  Recent changes in the last 24 hours include none.  Temperature normal  Plan  Current plan is for psychiatry observation and plan.     Gareth Morgan, MD 04/01/22 1106

## 2022-04-01 NOTE — ED Notes (Signed)
Pt sleeping@this time. Breathing even and unlabored. Will continue to monitor for safety 

## 2022-04-01 NOTE — ED Notes (Signed)
Pt has been dressed out and belongs have been gathered and placed in cabinet labeled "Patients 9-12; Hall B".    Bag One: GreyJacket  Bag Two: Shirt, Shoes, Chief of Staff  Security has wanded the patient

## 2022-04-01 NOTE — Progress Notes (Signed)
Received Redith from the assessment room, the skin assessment was completed. She was escorted to the unit and oriented to her new environment. She was given nourishments per her request. Shortly thereafter she took a shower. She endorsed feeling anxious, depressed and passive SI without a plan.  She stated hearing voices that are threatening.

## 2022-04-01 NOTE — ED Notes (Signed)
Pt denies SI'@this'$  time. Pt is very tired she say she has not slept much. She is calm and cooperative. She does have c/o of hearing voices. Will continue to monitor for safety

## 2022-04-01 NOTE — ED Notes (Signed)
Patient has been changed into scrubs due to her admitted SI thoughts.  Belongings have been inventoried and secured.  She is calm and cooperative.

## 2022-04-02 MED ORDER — HYDROXYZINE HCL 25 MG PO TABS: 25.0000 mg | ORAL_TABLET | Freq: Three times a day (TID) | ORAL | 0 refills | Status: AC | PRN

## 2022-04-02 MED ORDER — PALIPERIDONE ER 6 MG PO TB24
6.0000 mg | ORAL_TABLET | Freq: Every day | ORAL | Status: DC
  Filled 2022-04-02 (×2): qty 3

## 2022-04-02 MED ORDER — TRAZODONE HCL 50 MG PO TABS: 50.0000 mg | ORAL_TABLET | Freq: Every evening | ORAL | 0 refills | Status: DC | PRN

## 2022-04-02 MED ORDER — PALIPERIDONE ER 6 MG PO TB24: 6.0000 mg | ORAL_TABLET | Freq: Every day | ORAL | 0 refills | Status: AC

## 2022-04-02 NOTE — ED Notes (Signed)
Pt sleeping@this time. Breathing even and unlabored. Will continue to monitor for safety 

## 2022-04-02 NOTE — ED Notes (Signed)
Patient stated she is feeling anxious and would like her vistaril

## 2022-04-02 NOTE — Discharge Instructions (Addendum)
Continue all medication as prescribed,   Safety Plan LYRIQUE HAKIM will reach out to her significant other or call [phone] or  [phone] or return to [facility] or go to nearest emergency room if condition worsens or if suicidal thoughts become active Patients' will follow up with [facility]  for outpatient psychiatric services (therapy/medication management).  The suicide prevention education provided includes the following: Suicide risk factors Suicide prevention and interventions National Suicide Hotline telephone number [facility] assessment telephone number [facility] Emergency Assistance [place] and/or Residential Mobile Crisis Unit telephone number
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
## 2022-04-02 NOTE — ED Notes (Signed)
Patient is sitting bed quietly, no distress noted, will continue to monitor patient from safety.

## 2022-06-27 ENCOUNTER — Other Ambulatory Visit (HOSPITAL_COMMUNITY): Payer: Self-pay | Admitting: Adult Health

## 2022-06-27 DIAGNOSIS — Z1231 Encounter for screening mammogram for malignant neoplasm of breast: Secondary | ICD-10-CM

## 2022-07-07 ENCOUNTER — Encounter: Payer: Self-pay | Admitting: *Deleted

## 2022-07-29 ENCOUNTER — Other Ambulatory Visit: Payer: Self-pay | Admitting: *Deleted

## 2022-07-29 DIAGNOSIS — D179 Benign lipomatous neoplasm, unspecified: Secondary | ICD-10-CM

## 2022-08-02 ENCOUNTER — Ambulatory Visit: Payer: Medicaid Other | Admitting: General Surgery

## 2022-08-04 ENCOUNTER — Telehealth: Payer: Self-pay | Admitting: Cardiology

## 2022-08-04 ENCOUNTER — Other Ambulatory Visit: Payer: Self-pay | Admitting: Cardiology

## 2022-08-04 NOTE — Telephone Encounter (Signed)
Spoke with patient - states she has been taking the Potassium & Lasix daily.  Last seen 11/24/2021 - did schedule appointment to see Sharlene Dory, NP for September 05, 2022.  Refill request from pharmacy has message on it that it was d/c'd at hosp discharge on 04/02/2022 but I could not find documentation stating that.  Please advise on dosing.

## 2022-08-04 NOTE — Telephone Encounter (Signed)
Left message with fiance for her to return call.

## 2022-08-04 NOTE — Telephone Encounter (Signed)
Pt c/o medication issue:  1. Name of Medication: Furosemide 40 mg  2. How are you currently taking this medication (dosage and times per day)?   3. Are you having a reaction (difficulty breathing--STAT)?   4. What is your medication issue?   Please clarify medication instructions.

## 2022-08-04 NOTE — Telephone Encounter (Signed)
Addressed in refill note.

## 2022-08-04 NOTE — Telephone Encounter (Signed)
*  STAT* If patient is at the pharmacy, call can be transferred to refill team.   1. Which medications need to be refilled? (please list name of each medication and dose if known)  Potassium Chloride 10 MEQ   2. Which pharmacy/location (including street and city if local pharmacy) is medication to be sent to? [facility] - [place], [place] - [address]  3. Do they need a 30 day or 90 day supply?   90 day supply

## 2022-08-04 NOTE — Telephone Encounter (Signed)
Addressed in refill note.  

## 2022-08-09 ENCOUNTER — Ambulatory Visit: Payer: Medicaid Other | Admitting: Nurse Practitioner
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
## 2022-08-23 ENCOUNTER — Ambulatory Visit: Payer: Medicaid Other | Admitting: Obstetrics & Gynecology

## 2022-09-05 ENCOUNTER — Ambulatory Visit: Payer: Medicaid Other | Admitting: Nurse Practitioner

## 2022-09-08 ENCOUNTER — Encounter: Payer: Self-pay | Admitting: General Surgery

## 2022-09-08 ENCOUNTER — Ambulatory Visit (INDEPENDENT_AMBULATORY_CARE_PROVIDER_SITE_OTHER): Payer: Medicaid Other | Admitting: General Surgery

## 2022-09-08 VITALS — BP 134/85 | HR 76 | Temp 98.1°F | Resp 16 | Ht 63.0 in | Wt 152.0 lb

## 2022-09-08 DIAGNOSIS — R222 Localized swelling, mass and lump, trunk: Secondary | ICD-10-CM

## 2022-09-08 DIAGNOSIS — R2231 Localized swelling, mass and lump, right upper limb: Secondary | ICD-10-CM

## 2022-09-08 NOTE — Patient Instructions (Signed)
Will get US of the chest wall and right arm mass to ensure they are lipomas. These can be excised/ removed if you would like that done.   Will call with results and discuss the options after we get the [place] back.   Do not think these lesions are causing you to feel so poorly.
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
## 2022-09-13 ENCOUNTER — Emergency Department
Admission: EM | Admit: 2022-09-13 | Discharge: 2022-09-13 | Disposition: A | Discharge status: Rehabilitation Facility | Payer: No Typology Code available for payment source | Attending: Emergency Medicine | Admitting: Emergency Medicine

## 2022-09-13 DIAGNOSIS — F1910 Other psychoactive substance abuse, uncomplicated: Secondary | ICD-10-CM

## 2022-09-13 DIAGNOSIS — F1410 Cocaine abuse, uncomplicated: Secondary | ICD-10-CM | POA: Diagnosis present

## 2022-09-13 LAB — COMPREHENSIVE METABOLIC PANEL
ALT: 15 U/L (ref 0–44)
AST: 17 U/L (ref 15–41)
Albumin: 3.8 g/dL (ref 3.5–5.0)
Alkaline Phosphatase: 79 U/L (ref 38–126)
Anion gap: 8 (ref 5–15)
BUN: 25 mg/dL — ABNORMAL HIGH (ref 6–20)
CO2: 21 mmol/L — ABNORMAL LOW (ref 22–32)
Calcium: 8.6 mg/dL — ABNORMAL LOW (ref 8.9–10.3)
Chloride: 106 mmol/L (ref 98–111)
Creatinine, Ser: 0.93 mg/dL (ref 0.44–1.00)
GFR, Estimated: >60 mL/min (ref >60)
Glucose, Bld: 92 mg/dL (ref 70–99)
Potassium: 4.1 mmol/L (ref 3.5–5.1)
Sodium: 135 mmol/L (ref 135–145)
Total Bilirubin: 0.3 mg/dL (ref 0.3–1.2)
Total Protein: 6.5 g/dL (ref 6.5–8.1)

## 2022-09-13 LAB — CBC
HCT: 50.7 % — ABNORMAL HIGH (ref 36.0–46.0)
Hemoglobin: 16.8 g/dL — ABNORMAL HIGH (ref 12.0–15.0)
MCH: 32.3 pg (ref 26.0–34.0)
MCHC: 33.1 g/dL (ref 30.0–36.0)
MCV: 97.5 fL (ref 80.0–100.0)
Platelets: 234 10*3/uL (ref 150–400)
RBC: 5.2 MIL/uL — ABNORMAL HIGH (ref 3.87–5.11)
RDW: 15.3 % (ref 11.5–15.5)
WBC: 9.8 10*3/uL (ref 4.0–10.5)
nRBC: 0 % (ref 0.0–0.2)

## 2022-09-13 LAB — ETHANOL: Alcohol, Ethyl (B): <10 mg/dL (ref <10)

## 2022-09-13 NOTE — ED Notes (Signed)
Pt given boxed lunch

## 2022-09-13 NOTE — ED Triage Notes (Signed)
Pt sts that she has a really bad drug problem and wants to get clean. Pt sts that she has tried to get off the drugs by herself but is not able to. Pt sts that she has been to [facility] previously and she was an inpatient. Pt is A/Ox4

## 2022-09-13 NOTE — ED Provider Notes (Signed)
   [facility] Provider Note    Event Date/Time   First MD Initiated Contact with Patient 09/13/22 1634     (approximate)   History   Drug / Alcohol Assessment   HPI  Meredith [name] is a 57 y.o. female who reports she is here for detox from cocaine.  Patient reports she has been using for many years.  She denies physical complaints at this time.     Physical Exam   Triage Vital Signs: ED Triage Vitals  Enc Vitals Group     BP 09/13/22 1447 (!) 151/89     Pulse Rate 09/13/22 1447 80     Resp 09/13/22 1447 17     Temp 09/13/22 1447 98 F (36.7 C)     Temp Source 09/13/22 1447 Oral     SpO2 09/13/22 1447 98 %     Weight 09/13/22 1448 68.5 kg (151 lb)     Height --      Head Circumference --      Peak Flow --      Pain Score 09/13/22 1448 0     Pain Loc --      Pain Edu? --      Excl. in GC? --     Most recent vital signs: Vitals:   09/13/22 1447  BP: (!) 151/89  Pulse: 80  Resp: 17  Temp: 98 F (36.7 C)  SpO2: 98%     General: Awake, no distress.  CV:  Good peripheral perfusion.  Resp:  Normal effort.  Abd:  No distention.  Other:     ED Results / Procedures / Treatments   Labs (all labs ordered are listed, but only abnormal results are displayed) Labs Reviewed  COMPREHENSIVE METABOLIC PANEL - Abnormal; Notable for the following components:      Result Value   CO2 21 (*)    BUN 25 (*)    Calcium 8.6 (*)    All other components within normal limits  CBC - Abnormal; Notable for the following components:   RBC 5.20 (*)    Hemoglobin 16.8 (*)    HCT 50.7 (*)    All other components within normal limits  ETHANOL  URINE DRUG SCREEN, QUALITATIVE ([facility] ONLY)     EKG     RADIOLOGY     PROCEDURES:  Critical Care performed:   Procedures   MEDICATIONS ORDERED IN ED: Medications - No data to display   IMPRESSION / MDM / ASSESSMENT AND PLAN / ED COURSE  I reviewed the triage vital signs and the nursing  notes. Patient's presentation is most consistent with exacerbation of chronic illness.  Patient presents with reports of chronic substance abuse, is requesting assistance with detox.  She has no physical complaints at this time.  Overall well-appearing and in no distress.  No psychiatric complaints either.  Lab work reviewed and is reassuring.  We do not do inpatient detox in our facility, have referred to [facility]        FINAL CLINICAL IMPRESSION(S) / ED DIAGNOSES   Final diagnoses:  Substance abuse (HCC)     Rx / DC Orders   ED Discharge Orders     None        Note:  This document was prepared using Dragon voice recognition software and may include unintentional dictation errors.   Jene Every, MD 09/13/22 1655

## 2022-09-21 ENCOUNTER — Ambulatory Visit (HOSPITAL_COMMUNITY)
Admission: RE | Admit: 2022-09-21 | Discharge: 2022-09-21 | Disposition: A | Discharge status: Home / Self Care | Payer: Medicaid Other | Source: Ambulatory Visit | Attending: General Surgery | Admitting: General Surgery

## 2022-09-21 DIAGNOSIS — R222 Localized swelling, mass and lump, trunk: Secondary | ICD-10-CM

## 2022-09-21 DIAGNOSIS — R2231 Localized swelling, mass and lump, right upper limb: Secondary | ICD-10-CM | POA: Diagnosis present

## 2022-09-22 NOTE — Progress Notes (Signed)
Can you call and let her know the results

## 2022-09-22 NOTE — Progress Notes (Signed)
Patient's chest Korea was back and this does look like a lipoma, it is above the muscle and I do not think would cause her pulling pain down into her chest. If she wants this removed we can get her set up to do this. The RUE Korea is not read yet.

## 2022-09-23 NOTE — Progress Notes (Signed)
We can also remove the arm lipoma if she wants. Both are superficial.

## 2022-09-27 DIAGNOSIS — D1721 Benign lipomatous neoplasm of skin and subcutaneous tissue of right arm: Secondary | ICD-10-CM | POA: Insufficient documentation
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
## 2022-10-11 ENCOUNTER — Encounter (HOSPITAL_COMMUNITY)
Admission: RE | Admit: 2022-10-11 | Discharge: 2022-10-11 | Disposition: A | Discharge status: Home / Self Care | Payer: MEDICAID | Source: Ambulatory Visit | Attending: General Surgery | Admitting: General Surgery

## 2022-10-13 ENCOUNTER — Encounter (HOSPITAL_COMMUNITY): Payer: Self-pay | Admitting: Anesthesiology

## 2022-10-13 ENCOUNTER — Telehealth: Payer: Self-pay | Admitting: *Deleted

## 2022-10-13 NOTE — Telephone Encounter (Signed)
Excision Lipoma, RUE CPT: 24071 Dx: D17.2  Excision Lipoma, Chest Wall CPT: 21552 Dx:D17.1

## 2022-10-13 NOTE — Telephone Encounter (Signed)
-----   Message from Nurse Katheren Shams sent at 10/13/2022  2:54 PM EDT ----- Regarding: cancellation Patient called to cancelled her procedure for tomorrow.  She wants to follow up about her melanoma first and reschedule this procedure.

## 2022-10-13 NOTE — Progress Notes (Signed)
Patient called to cancel her procedure for tomorrow.  She stated that she will need to follow up with her provider about her melanoma first and then reschedule.

## 2022-10-14 ENCOUNTER — Encounter (HOSPITAL_COMMUNITY): Admission: RE | Payer: Self-pay | Source: Home / Self Care

## 2022-10-14 ENCOUNTER — Ambulatory Visit (HOSPITAL_COMMUNITY): Admission: RE | Admit: 2022-10-14 | Payer: MEDICAID | Source: Home / Self Care | Admitting: General Surgery

## 2022-10-14 DIAGNOSIS — R222 Localized swelling, mass and lump, trunk: Secondary | ICD-10-CM | POA: Diagnosis present

## 2022-10-14 DIAGNOSIS — D1721 Benign lipomatous neoplasm of skin and subcutaneous tissue of right arm: Secondary | ICD-10-CM | POA: Insufficient documentation

## 2022-10-14 SURGERY — EXCISION LIPOMA
Anesthesia: General | Laterality: Right

## 2022-10-26 ENCOUNTER — Other Ambulatory Visit: Payer: Self-pay | Admitting: Cardiology

## 2022-12-28 ENCOUNTER — Ambulatory Visit: Payer: MEDICAID | Admitting: Urology

## 2023-05-25 ENCOUNTER — Other Ambulatory Visit (HOSPITAL_COMMUNITY): Payer: Self-pay | Admitting: Adult Health

## 2023-05-25 DIAGNOSIS — I509 Heart failure, unspecified: Secondary | ICD-10-CM

## 2023-07-05 ENCOUNTER — Ambulatory Visit: Payer: MEDICAID | Attending: Cardiology | Admitting: Cardiology

## 2023-10-20 ENCOUNTER — Encounter: Payer: Self-pay | Admitting: Advanced Practice Midwife

## 2024-01-17 ENCOUNTER — Encounter (INDEPENDENT_AMBULATORY_CARE_PROVIDER_SITE_OTHER): Payer: Self-pay | Admitting: Gastroenterology
